# Patient Record
Sex: Male | Born: 1965 | Race: White | Hispanic: No | Smoking: Never smoker
Health system: Southern US, Community
[De-identification: ages and names within clinical notes are randomized; demographics above are authoritative.]

## PROBLEM LIST (undated history)

## (undated) DIAGNOSIS — I639 Cerebral infarction, unspecified: Secondary | ICD-10-CM

## (undated) DIAGNOSIS — E039 Hypothyroidism, unspecified: Secondary | ICD-10-CM

## (undated) DIAGNOSIS — I10 Essential (primary) hypertension: Secondary | ICD-10-CM

## (undated) DIAGNOSIS — G4730 Sleep apnea, unspecified: Secondary | ICD-10-CM

## (undated) DIAGNOSIS — G35 Multiple sclerosis: Secondary | ICD-10-CM

## (undated) DIAGNOSIS — I219 Acute myocardial infarction, unspecified: Secondary | ICD-10-CM

## (undated) DIAGNOSIS — R569 Unspecified convulsions: Secondary | ICD-10-CM

## (undated) DIAGNOSIS — R55 Syncope and collapse: Secondary | ICD-10-CM

## (undated) DIAGNOSIS — H469 Unspecified optic neuritis: Secondary | ICD-10-CM

---

## 1988-05-25 DIAGNOSIS — G35 Multiple sclerosis: Secondary | ICD-10-CM

## 1988-05-25 HISTORY — DX: Multiple sclerosis: G35

## 2005-05-25 HISTORY — PX: JOINT REPLACEMENT: SHX530

## 2013-05-25 HISTORY — PX: CORONARY ARTERY BYPASS GRAFT: SHX141

## 2013-05-25 HISTORY — PX: OTHER SURGICAL HISTORY: SHX169

## 2016-05-25 DIAGNOSIS — R55 Syncope and collapse: Secondary | ICD-10-CM

## 2016-05-25 HISTORY — DX: Syncope and collapse: R55

## 2016-09-21 ENCOUNTER — Emergency Department: Payer: Medicare Other

## 2016-09-21 ENCOUNTER — Inpatient Hospital Stay
Admission: EM | Admit: 2016-09-21 | Discharge: 2016-09-23 | DRG: 494 | Disposition: A | Discharge status: Home / Self Care | Payer: Medicare Other | Attending: Surgery | Admitting: Surgery

## 2016-09-21 DIAGNOSIS — S9304XA Dislocation of right ankle joint, initial encounter: Secondary | ICD-10-CM

## 2016-09-21 DIAGNOSIS — I2510 Atherosclerotic heart disease of native coronary artery without angina pectoris: Secondary | ICD-10-CM | POA: Diagnosis present

## 2016-09-21 DIAGNOSIS — M25571 Pain in right ankle and joints of right foot: Secondary | ICD-10-CM | POA: Diagnosis not present

## 2016-09-21 DIAGNOSIS — Z880 Allergy status to penicillin: Secondary | ICD-10-CM

## 2016-09-21 DIAGNOSIS — J449 Chronic obstructive pulmonary disease, unspecified: Secondary | ICD-10-CM | POA: Diagnosis present

## 2016-09-21 DIAGNOSIS — T148XXA Other injury of unspecified body region, initial encounter: Secondary | ICD-10-CM

## 2016-09-21 DIAGNOSIS — I10 Essential (primary) hypertension: Secondary | ICD-10-CM | POA: Diagnosis present

## 2016-09-21 DIAGNOSIS — S99911A Unspecified injury of right ankle, initial encounter: Secondary | ICD-10-CM | POA: Diagnosis not present

## 2016-09-21 DIAGNOSIS — Z951 Presence of aortocoronary bypass graft: Secondary | ICD-10-CM

## 2016-09-21 DIAGNOSIS — R55 Syncope and collapse: Secondary | ICD-10-CM | POA: Diagnosis present

## 2016-09-21 DIAGNOSIS — S82851A Displaced trimalleolar fracture of right lower leg, initial encounter for closed fracture: Principal | ICD-10-CM | POA: Diagnosis present

## 2016-09-21 DIAGNOSIS — G4730 Sleep apnea, unspecified: Secondary | ICD-10-CM | POA: Diagnosis present

## 2016-09-21 DIAGNOSIS — S9301XA Subluxation of right ankle joint, initial encounter: Secondary | ICD-10-CM | POA: Diagnosis not present

## 2016-09-21 DIAGNOSIS — W19XXXA Unspecified fall, initial encounter: Secondary | ICD-10-CM | POA: Diagnosis present

## 2016-09-21 DIAGNOSIS — S82891A Other fracture of right lower leg, initial encounter for closed fracture: Secondary | ICD-10-CM | POA: Diagnosis present

## 2016-09-21 DIAGNOSIS — G35 Multiple sclerosis: Secondary | ICD-10-CM | POA: Diagnosis present

## 2016-09-21 DIAGNOSIS — E039 Hypothyroidism, unspecified: Secondary | ICD-10-CM | POA: Diagnosis present

## 2016-09-21 DIAGNOSIS — I252 Old myocardial infarction: Secondary | ICD-10-CM

## 2016-09-21 DIAGNOSIS — S8991XA Unspecified injury of right lower leg, initial encounter: Secondary | ICD-10-CM | POA: Diagnosis not present

## 2016-09-21 HISTORY — DX: Multiple sclerosis: G35

## 2016-09-21 HISTORY — DX: Sleep apnea, unspecified: G47.30

## 2016-09-21 HISTORY — DX: Syncope and collapse: R55

## 2016-09-21 HISTORY — DX: Essential (primary) hypertension: I10

## 2016-09-21 HISTORY — DX: Hypothyroidism, unspecified: E03.9

## 2016-09-21 HISTORY — DX: Acute myocardial infarction, unspecified: I21.9

## 2016-09-21 HISTORY — DX: Unspecified optic neuritis: H46.9

## 2016-09-21 HISTORY — DX: Unspecified convulsions: R56.9

## 2016-09-21 LAB — CBC WITH DIFFERENTIAL/PLATELET
BASOS ABS: 0 10*3/uL (ref 0–0.1)
Basophils Relative: 1 %
EOS ABS: 0.2 10*3/uL (ref 0–0.7)
EOS PCT: 2 %
HCT: 42 % (ref 40.0–52.0)
Hemoglobin: 14.1 g/dL (ref 13.0–18.0)
Lymphocytes Relative: 24 %
Lymphs Abs: 2.3 10*3/uL (ref 1.0–3.6)
MCH: 30.7 pg (ref 26.0–34.0)
MCHC: 33.6 g/dL (ref 32.0–36.0)
MCV: 91.2 fL (ref 80.0–100.0)
Monocytes Absolute: 0.6 10*3/uL (ref 0.2–1.0)
Monocytes Relative: 6 %
NEUTROS PCT: 67 %
Neutro Abs: 6.4 10*3/uL (ref 1.4–6.5)
PLATELETS: 363 10*3/uL (ref 150–440)
RBC: 4.6 MIL/uL (ref 4.40–5.90)
RDW: 13.7 % (ref 11.5–14.5)
WBC: 9.5 10*3/uL (ref 3.8–10.6)

## 2016-09-21 LAB — BASIC METABOLIC PANEL
Anion gap: 11 (ref 5–15)
BUN: 19 mg/dL (ref 6–20)
CALCIUM: 9.3 mg/dL (ref 8.9–10.3)
CHLORIDE: 106 mmol/L (ref 101–111)
CO2: 22 mmol/L (ref 22–32)
CREATININE: 1.48 mg/dL — AB (ref 0.61–1.24)
GFR calc Af Amer: >60 mL/min (ref >60)
GFR calc non Af Amer: 53 mL/min — ABNORMAL LOW (ref >60)
Glucose, Bld: 95 mg/dL (ref 65–99)
Potassium: 4 mmol/L (ref 3.5–5.1)
SODIUM: 139 mmol/L (ref 135–145)

## 2016-09-21 LAB — HEPATIC FUNCTION PANEL
ALT: 33 U/L (ref 17–63)
AST: 31 U/L (ref 15–41)
Albumin: 4.4 g/dL (ref 3.5–5.0)
Alkaline Phosphatase: 74 U/L (ref 38–126)
BILIRUBIN TOTAL: 0.5 mg/dL (ref 0.3–1.2)
Bilirubin, Direct: <0.1 mg/dL — ABNORMAL LOW (ref 0.1–0.5)
TOTAL PROTEIN: 7.7 g/dL (ref 6.5–8.1)

## 2016-09-21 LAB — TYPE AND SCREEN
ABO/RH(D): A POS
ANTIBODY SCREEN: NEGATIVE

## 2016-09-21 LAB — PROTIME-INR
INR: 0.93
PROTHROMBIN TIME: 12.5 s (ref 11.4–15.2)

## 2016-09-21 MED ORDER — FENTANYL CITRATE (PF) 100 MCG/2ML IJ SOLN
100.0000 ug | Freq: Once | INTRAMUSCULAR | Status: AC
  Administered 2016-09-21: 100 ug via INTRAVENOUS

## 2016-09-21 MED ORDER — SODIUM CHLORIDE 0.9 % IV BOLUS (SEPSIS)
1000.0000 mL | Freq: Once | INTRAVENOUS | Status: AC
  Administered 2016-09-22: 1000 mL via INTRAVENOUS

## 2016-09-21 MED ORDER — FENTANYL CITRATE (PF) 100 MCG/2ML IJ SOLN
INTRAMUSCULAR | Status: AC
  Filled 2016-09-21: qty 2

## 2016-09-21 NOTE — ED Provider Notes (Addendum)
[facility] Emergency Department Provider Note  ____________________________________________   First MD Initiated Contact with Patient 09/21/16 2016     (approximate)  I have reviewed the triage vital signs and the nursing notes.   HISTORY  Chief Complaint Fall and Extremity Pain    HPI Cody Burns is a 51 y.o. male who comes to the emergency department with severe right ankle pain he sustained about half an hour prior to arrival when he fell down while working in the kitchen. He has been unable to ambulate since.He denies chest pain or shortness of breath. He denies palpitations. His only past medical history is hypertension and multiple sclerosis. He has never syncopized before. He denies headache. He denies numbness or weakness. His pain is severe right ankle nonradiating. Worse with movement and improved with immobilization.   Past Medical History:  Diagnosis Date  . Hypertension   . Myocardial infarct (HCC)     There are no active problems to display for this patient.   Past Surgical History:  Procedure Laterality Date  . quadruple cardiac bypass      Prior to Admission medications   Not on File    Allergies Penicillins  History reviewed. No pertinent family history.  Social History Social History  Substance Use Topics  . Smoking status: Never Smoker  . Smokeless tobacco: Not on file  . Alcohol use Yes    Review of Systems Constitutional: No fever/chills Eyes: No visual changes. ENT: No sore throat. Cardiovascular: Denies chest pain. Respiratory: Denies shortness of breath. Gastrointestinal: No abdominal pain.  No nausea, no vomiting.  No diarrhea.  No constipation. Genitourinary: Negative for dysuria. Musculoskeletal: Negative for back pain. Skin: Negative for rash. Neurological: Negative for headaches, focal weakness or numbness.  10-point ROS otherwise  negative.  ____________________________________________   PHYSICAL EXAM:  VITAL SIGNS: ED Triage Vitals  Enc Vitals Group     BP      Pulse      Resp      Temp      Temp src      SpO2      Weight      Height      Head Circumference      Peak Flow      Pain Score      Pain Loc      Pain Edu?      Excl. in GC?     Constitutional: Alert and oriented x 4 well appearing nontoxic no diaphoresis speaks in full, clear sentences Eyes: PERRL EOMI. Head: Atraumatic. Nose: No congestion/rhinnorhea. Mouth/Throat: No trismus Neck: No stridor.   Cardiovascular: Normal rate, regular rhythm. Grossly normal heart sounds.  Good peripheral circulation. Respiratory: Normal respiratory effort.  No retractions. Lungs CTAB and moving good air Gastrointestinal: Soft nontender Musculoskeletal: Right ankle obviously dislocated laterally with tenting of the skin medially but still closed 2+ dorsalis pulse compartments soft + motor ehl, edl, fhl, fdl ta, g SILT sural, saphenous, tibial, dp, sp Neurologic:  Normal speech and language. No gross focal neurologic deficits are appreciated. Skin:  Skin is warm, dry and intact. No rash noted. Psychiatric: Mood and affect are normal. Speech and behavior are normal.    ____________________________________________  ____________________________________________   LABS (all labs ordered are listed, but only abnormal results are displayed)  Labs Reviewed  BASIC METABOLIC PANEL - Abnormal; Notable for the following:       Result Value   Creatinine, Ser 1.48 (*)    GFR  calc non Af Amer 53 (*)    All other components within normal limits  HEPATIC FUNCTION PANEL - Abnormal; Notable for the following:    Bilirubin, Direct <0.1 (*)    All other components within normal limits  CBC WITH DIFFERENTIAL/PLATELET  PROTIME-INR  TYPE AND SCREEN    Slight elevation of creatinine that without knowing previous is difficult  to __________________________________________  EKG  ED ECG REPORT I, Merrily Brittle, the attending physician, personally viewed and interpreted this ECG.  Date: 09/21/2016 Rate: 72 Rhythm: normal sinus rhythm QRS Axis: normal Intervals: normal ST/T Wave abnormalities: normal Conduction Disturbances: none Narrative Interpretation: unremarkable  ____________________________________________  RADIOLOGY  X-ray of the ankle shows trimalleolar fracture dislocation Repeat x-ray shows successful reduction of the ankle ____________________________________________   PROCEDURES  Procedure(s) performed: yes  Reduction of dislocation Date/Time: 11:51 PM Performed by: Merrily Brittle Authorized by: Merrily Brittle Consent: Verbal consent obtained. Risks and benefits: risks, benefits and alternatives were discussed Consent given by: patient Required items: required blood products, implants, devices, and special equipment available Time out: Immediately prior to procedure a "time out" was called to verify the correct patient, procedure, equipment, support staff and site/side marked as required.  Vitals: Vital signs were monitored during sedation. Patient tolerance: Patient tolerated the procedure well with no immediate complications. Joint: Right ankle Reduction technique: Exacerbated the patient's injury and then providing countertraction and then reducing    Procedures  Critical Care performed: no  Observation: no ____________________________________________   INITIAL IMPRESSION / ASSESSMENT AND PLAN / ED COURSE  Pertinent labs & imaging results that were available during my care of the patient were reviewed by me and considered in my medical decision making (see chart for details).  On arrival the patient is neurovascularly intact with a clear dislocation of his right ankle. X-ray confirms fracture as well as dislocation. Although he had a strong pulse used tenting his  skin medially so I reduce the fracture with sentinel analgesia. While splinting in a posterior short leg and stirrup lost the reduction several times, however on final splinting and reduction was intact. I discussed the case with on-call orthopedic surgeon Dr. Joice Lofts who recommended inpatient admission and surgical fixation in the morning. The patient belongs [employer] so we'll touch base with them as he is currently stable for transfer.      ----------------------------------------- 12:07 AM on 09/22/2016 -----------------------------------------  I spoke with Dr. Katha Hamming at the [facility] who spoke with his Orthopedic surgeons who said they don't have OR space for the next week and that the patient should be admitted to our facility. I then discussed the case with Dr. Joice Lofts who has graciously agreed to admit the patient to his service. ____________________________________________   FINAL CLINICAL IMPRESSION(S) / ED DIAGNOSES  Final diagnoses:  Closed fracture of right ankle, initial encounter  Ankle dislocation, right, initial encounter      NEW MEDICATIONS STARTED DURING THIS VISIT:  New Prescriptions   No medications on file     Note:  This document was prepared using Dragon voice recognition software and may include unintentional dictation errors.     Merrily Brittle, MD 09/21/16 2357    Merrily Brittle, MD 09/22/16 1914    Merrily Brittle, MD 09/22/16 7829

## 2016-09-21 NOTE — ED Notes (Signed)
Ankle reduced by Dr. Lamont Snowball. Deberah Castle, Kennedy Bucker EDT, Tech student at bedside to wrap and immobilize ankle. Xray called back to room to do confirmation xray.

## 2016-09-21 NOTE — ED Triage Notes (Signed)
Pt arrives from home via [facility] for a slip and fall while doing dishes. PT arrives with R ankle dislocated, obvious deformity, swelling, bruising. Cool to touch. Strong pedal pulses. Pale/dusky color. Unable to wiggle toes. Alert and oriented. States he does not remember fall or if he hit his head.

## 2016-09-21 NOTE — ED Notes (Signed)
Pt family at bedside. Pt resting comfortably. Denies any needs at this time.

## 2016-09-22 ENCOUNTER — Inpatient Hospital Stay: Payer: Medicare Other

## 2016-09-22 ENCOUNTER — Inpatient Hospital Stay: Payer: Medicare Other | Admitting: Anesthesiology

## 2016-09-22 ENCOUNTER — Encounter
Admission: EM | Disposition: A | Discharge status: Home / Self Care | Payer: Self-pay | Source: Home / Self Care | Attending: Surgery

## 2016-09-22 DIAGNOSIS — W19XXXA Unspecified fall, initial encounter: Secondary | ICD-10-CM | POA: Diagnosis present

## 2016-09-22 DIAGNOSIS — I252 Old myocardial infarction: Secondary | ICD-10-CM | POA: Diagnosis not present

## 2016-09-22 DIAGNOSIS — S82891A Other fracture of right lower leg, initial encounter for closed fracture: Secondary | ICD-10-CM | POA: Diagnosis not present

## 2016-09-22 DIAGNOSIS — G35 Multiple sclerosis: Secondary | ICD-10-CM | POA: Diagnosis present

## 2016-09-22 DIAGNOSIS — S9301XA Subluxation of right ankle joint, initial encounter: Secondary | ICD-10-CM | POA: Diagnosis not present

## 2016-09-22 DIAGNOSIS — E039 Hypothyroidism, unspecified: Secondary | ICD-10-CM | POA: Diagnosis present

## 2016-09-22 DIAGNOSIS — Z951 Presence of aortocoronary bypass graft: Secondary | ICD-10-CM | POA: Diagnosis not present

## 2016-09-22 DIAGNOSIS — Z880 Allergy status to penicillin: Secondary | ICD-10-CM | POA: Diagnosis not present

## 2016-09-22 DIAGNOSIS — S82301A Unspecified fracture of lower end of right tibia, initial encounter for closed fracture: Secondary | ICD-10-CM | POA: Diagnosis not present

## 2016-09-22 DIAGNOSIS — I2510 Atherosclerotic heart disease of native coronary artery without angina pectoris: Secondary | ICD-10-CM | POA: Diagnosis present

## 2016-09-22 DIAGNOSIS — S82851A Displaced trimalleolar fracture of right lower leg, initial encounter for closed fracture: Secondary | ICD-10-CM | POA: Diagnosis not present

## 2016-09-22 DIAGNOSIS — S99911A Unspecified injury of right ankle, initial encounter: Secondary | ICD-10-CM | POA: Diagnosis not present

## 2016-09-22 DIAGNOSIS — S82831A Other fracture of upper and lower end of right fibula, initial encounter for closed fracture: Secondary | ICD-10-CM | POA: Diagnosis not present

## 2016-09-22 DIAGNOSIS — M25571 Pain in right ankle and joints of right foot: Secondary | ICD-10-CM | POA: Diagnosis not present

## 2016-09-22 DIAGNOSIS — S8991XA Unspecified injury of right lower leg, initial encounter: Secondary | ICD-10-CM | POA: Diagnosis not present

## 2016-09-22 DIAGNOSIS — I10 Essential (primary) hypertension: Secondary | ICD-10-CM | POA: Diagnosis present

## 2016-09-22 DIAGNOSIS — R55 Syncope and collapse: Secondary | ICD-10-CM | POA: Diagnosis present

## 2016-09-22 DIAGNOSIS — G4730 Sleep apnea, unspecified: Secondary | ICD-10-CM | POA: Diagnosis present

## 2016-09-22 DIAGNOSIS — J449 Chronic obstructive pulmonary disease, unspecified: Secondary | ICD-10-CM | POA: Diagnosis present

## 2016-09-22 HISTORY — PX: ORIF ANKLE FRACTURE: SHX5408

## 2016-09-22 LAB — SURGICAL PCR SCREEN
MRSA, PCR: NEGATIVE
STAPHYLOCOCCUS AUREUS: NEGATIVE

## 2016-09-22 SURGERY — OPEN REDUCTION INTERNAL FIXATION (ORIF) ANKLE FRACTURE
Anesthesia: General | Site: Ankle | Laterality: Right | Wound class: Clean

## 2016-09-22 MED ORDER — MEPERIDINE HCL 50 MG/ML IJ SOLN: 6.2500 mg | INTRAMUSCULAR | Status: DC | PRN

## 2016-09-22 MED ORDER — CLINDAMYCIN PHOSPHATE 600 MG/50ML IV SOLN
600.0000 mg | Freq: Four times a day (QID) | INTRAVENOUS | Status: DC
  Administered 2016-09-22 – 2016-09-23 (×2): 600 mg via INTRAVENOUS
  Filled 2016-09-22 (×3): qty 50

## 2016-09-22 MED ORDER — GABAPENTIN 300 MG PO CAPS
900.0000 mg | ORAL_CAPSULE | Freq: Four times a day (QID) | ORAL | Status: DC
  Administered 2016-09-22: 900 mg via ORAL
  Filled 2016-09-22 (×2): qty 3

## 2016-09-22 MED ORDER — ONDANSETRON HCL 4 MG/2ML IJ SOLN: 4.0000 mg | Freq: Four times a day (QID) | INTRAMUSCULAR | Status: DC | PRN

## 2016-09-22 MED ORDER — FLEET ENEMA 7-19 GM/118ML RE ENEM: 1.0000 | ENEMA | Freq: Once | RECTAL | Status: DC | PRN

## 2016-09-22 MED ORDER — PROMETHAZINE HCL 25 MG/ML IJ SOLN: 6.2500 mg | INTRAMUSCULAR | Status: DC | PRN

## 2016-09-22 MED ORDER — PANTOPRAZOLE SODIUM 40 MG IV SOLR: 40.0000 mg | Freq: Every day | INTRAVENOUS | Status: DC

## 2016-09-22 MED ORDER — ACETAMINOPHEN 325 MG PO TABS: 650.0000 mg | ORAL_TABLET | Freq: Four times a day (QID) | ORAL | Status: DC | PRN

## 2016-09-22 MED ORDER — EPHEDRINE SULFATE 50 MG/ML IJ SOLN
INTRAMUSCULAR | Status: DC | PRN
  Administered 2016-09-22 (×3): 10 mg via INTRAVENOUS

## 2016-09-22 MED ORDER — OXYCODONE HCL 5 MG PO TABS
5.0000 mg | ORAL_TABLET | ORAL | Status: DC | PRN
  Administered 2016-09-22 (×2): 5 mg via ORAL
  Filled 2016-09-22 (×2): qty 1

## 2016-09-22 MED ORDER — NEOMYCIN-POLYMYXIN B GU 40-200000 IR SOLN
Status: AC
  Filled 2016-09-22: qty 4

## 2016-09-22 MED ORDER — OXYCODONE HCL 5 MG PO TABS: 5.0000 mg | ORAL_TABLET | ORAL | Status: DC | PRN

## 2016-09-22 MED ORDER — LABETALOL HCL 5 MG/ML IV SOLN
INTRAVENOUS | Status: DC | PRN
  Administered 2016-09-22: 5 mg via INTRAVENOUS

## 2016-09-22 MED ORDER — DIMETHYL FUMARATE 240 MG PO CPDR: 240.0000 mg | DELAYED_RELEASE_CAPSULE | Freq: Two times a day (BID) | ORAL | Status: DC

## 2016-09-22 MED ORDER — PHENYLEPHRINE 8 MG IN D5W 100 ML (0.08MG/ML) PREMIX OPTIME
INJECTION | INTRAVENOUS | Status: DC | PRN
Start: 2016-09-22 — End: 2016-09-22
  Administered 2016-09-22: 20 ug/min via INTRAVENOUS

## 2016-09-22 MED ORDER — VITAMIN B-12 1000 MCG PO TABS
1000.0000 ug | ORAL_TABLET | Freq: Every day | ORAL | Status: DC
  Filled 2016-09-22: qty 1

## 2016-09-22 MED ORDER — LEVOTHYROXINE SODIUM 75 MCG PO TABS
75.0000 ug | ORAL_TABLET | Freq: Every day | ORAL | Status: DC
  Filled 2016-09-22: qty 1

## 2016-09-22 MED ORDER — FENTANYL CITRATE (PF) 100 MCG/2ML IJ SOLN: 25.0000 ug | INTRAMUSCULAR | Status: DC | PRN

## 2016-09-22 MED ORDER — PROPOFOL 10 MG/ML IV BOLUS
INTRAVENOUS | Status: AC
  Filled 2016-09-22: qty 20

## 2016-09-22 MED ORDER — MONTELUKAST SODIUM 10 MG PO TABS
10.0000 mg | ORAL_TABLET | Freq: Every day | ORAL | Status: DC
  Administered 2016-09-22: 10 mg via ORAL
  Filled 2016-09-22: qty 1

## 2016-09-22 MED ORDER — ACETAMINOPHEN 500 MG PO TABS
1000.0000 mg | ORAL_TABLET | Freq: Four times a day (QID) | ORAL | Status: DC
  Administered 2016-09-22 – 2016-09-23 (×2): 1000 mg via ORAL
  Filled 2016-09-22 (×2): qty 2

## 2016-09-22 MED ORDER — BISACODYL 10 MG RE SUPP: 10.0000 mg | Freq: Every day | RECTAL | Status: DC | PRN

## 2016-09-22 MED ORDER — BUPIVACAINE HCL (PF) 0.5 % IJ SOLN
INTRAMUSCULAR | Status: AC
  Filled 2016-09-22: qty 30

## 2016-09-22 MED ORDER — MIDAZOLAM HCL 2 MG/2ML IJ SOLN
INTRAMUSCULAR | Status: AC
  Filled 2016-09-22: qty 2

## 2016-09-22 MED ORDER — METOPROLOL SUCCINATE ER 50 MG PO TB24
100.0000 mg | ORAL_TABLET | Freq: Every day | ORAL | Status: DC
  Filled 2016-09-22: qty 2

## 2016-09-22 MED ORDER — SODIUM CHLORIDE 0.9 % IV SOLN
INTRAVENOUS | Status: DC
  Administered 2016-09-22: 75 mL/h via INTRAVENOUS

## 2016-09-22 MED ORDER — ACETAMINOPHEN 10 MG/ML IV SOLN
INTRAVENOUS | Status: DC | PRN
  Administered 2016-09-22: 1000 mg via INTRAVENOUS

## 2016-09-22 MED ORDER — OXYCODONE HCL 5 MG/5ML PO SOLN: 5.0000 mg | Freq: Once | ORAL | Status: DC | PRN

## 2016-09-22 MED ORDER — PHENYLEPHRINE HCL 10 MG/ML IJ SOLN
INTRAMUSCULAR | Status: DC | PRN
  Administered 2016-09-22 (×3): 100 ug via INTRAVENOUS

## 2016-09-22 MED ORDER — DOCUSATE SODIUM 100 MG PO CAPS: 100.0000 mg | ORAL_CAPSULE | Freq: Two times a day (BID) | ORAL | Status: DC

## 2016-09-22 MED ORDER — HYDROMORPHONE HCL 1 MG/ML IJ SOLN: 1.0000 mg | INTRAMUSCULAR | Status: DC | PRN

## 2016-09-22 MED ORDER — KETOROLAC TROMETHAMINE 15 MG/ML IJ SOLN
15.0000 mg | Freq: Four times a day (QID) | INTRAMUSCULAR | Status: DC
  Administered 2016-09-22 – 2016-09-23 (×2): 15 mg via INTRAVENOUS
  Filled 2016-09-22 (×2): qty 1

## 2016-09-22 MED ORDER — DIPHENHYDRAMINE HCL 12.5 MG/5ML PO ELIX: 12.5000 mg | ORAL_SOLUTION | ORAL | Status: DC | PRN

## 2016-09-22 MED ORDER — ESCITALOPRAM OXALATE 10 MG PO TABS
20.0000 mg | ORAL_TABLET | Freq: Every day | ORAL | Status: DC
  Filled 2016-09-22: qty 2

## 2016-09-22 MED ORDER — METOPROLOL SUCCINATE ER 50 MG PO TB24
100.0000 mg | ORAL_TABLET | Freq: Every day | ORAL | Status: DC
  Administered 2016-09-22: 100 mg via ORAL

## 2016-09-22 MED ORDER — MAGNESIUM HYDROXIDE 400 MG/5ML PO SUSP: 30.0000 mL | Freq: Every day | ORAL | Status: DC | PRN

## 2016-09-22 MED ORDER — KETOROLAC TROMETHAMINE 30 MG/ML IJ SOLN
INTRAMUSCULAR | Status: DC | PRN
  Administered 2016-09-22: 30 mg via INTRAVENOUS

## 2016-09-22 MED ORDER — FENTANYL CITRATE (PF) 100 MCG/2ML IJ SOLN
INTRAMUSCULAR | Status: DC | PRN
  Administered 2016-09-22: 50 ug via INTRAVENOUS
  Administered 2016-09-22: 100 ug via INTRAVENOUS
  Administered 2016-09-22 (×4): 50 ug via INTRAVENOUS

## 2016-09-22 MED ORDER — POTASSIUM CHLORIDE 2 MEQ/ML IV SOLN
INTRAVENOUS | Status: DC
  Administered 2016-09-22 – 2016-09-23 (×2): via INTRAVENOUS
  Filled 2016-09-22 (×5): qty 1000

## 2016-09-22 MED ORDER — KCL IN DEXTROSE-NACL 20-5-0.9 MEQ/L-%-% IV SOLN
INTRAVENOUS | Status: DC
  Administered 2016-09-22: 19:00:00 via INTRAVENOUS
  Filled 2016-09-22 (×3): qty 1000

## 2016-09-22 MED ORDER — CALCIUM CARBONATE-VITAMIN D 500-200 MG-UNIT PO TABS
1.0000 | ORAL_TABLET | Freq: Every day | ORAL | Status: DC
  Filled 2016-09-22: qty 1

## 2016-09-22 MED ORDER — OXYCODONE HCL 5 MG PO TABS: 5.0000 mg | ORAL_TABLET | Freq: Once | ORAL | Status: DC | PRN

## 2016-09-22 MED ORDER — METOCLOPRAMIDE HCL 10 MG PO TABS: 5.0000 mg | ORAL_TABLET | Freq: Three times a day (TID) | ORAL | Status: DC | PRN

## 2016-09-22 MED ORDER — ATORVASTATIN CALCIUM 20 MG PO TABS
80.0000 mg | ORAL_TABLET | Freq: Every day | ORAL | Status: DC
  Administered 2016-09-22: 80 mg via ORAL
  Filled 2016-09-22: qty 4

## 2016-09-22 MED ORDER — AMLODIPINE BESYLATE 5 MG PO TABS
5.0000 mg | ORAL_TABLET | Freq: Every day | ORAL | Status: DC
  Filled 2016-09-22: qty 1

## 2016-09-22 MED ORDER — KETOROLAC TROMETHAMINE 15 MG/ML IJ SOLN: 30.0000 mg | Freq: Once | INTRAMUSCULAR | Status: DC

## 2016-09-22 MED ORDER — QUETIAPINE FUMARATE 100 MG PO TABS
100.0000 mg | ORAL_TABLET | Freq: Every day | ORAL | Status: DC
  Administered 2016-09-22: 100 mg via ORAL
  Filled 2016-09-22: qty 1

## 2016-09-22 MED ORDER — HYDROMORPHONE HCL 1 MG/ML IJ SOLN
1.0000 mg | INTRAMUSCULAR | Status: DC | PRN
  Administered 2016-09-22 (×3): 1 mg via INTRAVENOUS
  Filled 2016-09-22 (×3): qty 1

## 2016-09-22 MED ORDER — ONDANSETRON HCL 4 MG/2ML IJ SOLN
INTRAMUSCULAR | Status: AC
  Filled 2016-09-22: qty 2

## 2016-09-22 MED ORDER — CLINDAMYCIN PHOSPHATE 900 MG/50ML IV SOLN
900.0000 mg | Freq: Once | INTRAVENOUS | Status: AC
  Administered 2016-09-22: 900 mg via INTRAVENOUS
  Filled 2016-09-22: qty 50

## 2016-09-22 MED ORDER — ACETAMINOPHEN 650 MG RE SUPP: 650.0000 mg | Freq: Four times a day (QID) | RECTAL | Status: DC | PRN

## 2016-09-22 MED ORDER — FENTANYL CITRATE (PF) 100 MCG/2ML IJ SOLN
INTRAMUSCULAR | Status: AC
Start: 2016-09-22 — End: 2016-09-22
  Filled 2016-09-22: qty 2

## 2016-09-22 MED ORDER — METOCLOPRAMIDE HCL 5 MG/ML IJ SOLN: 5.0000 mg | Freq: Three times a day (TID) | INTRAMUSCULAR | Status: DC | PRN

## 2016-09-22 MED ORDER — NEOMYCIN-POLYMYXIN B GU 40-200000 IR SOLN
Status: DC | PRN
  Administered 2016-09-22: 4 mL

## 2016-09-22 MED ORDER — FENTANYL CITRATE (PF) 250 MCG/5ML IJ SOLN
INTRAMUSCULAR | Status: AC
  Filled 2016-09-22: qty 5

## 2016-09-22 MED ORDER — METOPROLOL SUCCINATE ER 100 MG PO TB24
200.0000 mg | ORAL_TABLET | Freq: Every day | ORAL | Status: DC
  Filled 2016-09-22 (×2): qty 2

## 2016-09-22 MED ORDER — ACETAMINOPHEN 10 MG/ML IV SOLN
INTRAVENOUS | Status: AC
  Filled 2016-09-22: qty 100

## 2016-09-22 MED ORDER — EPHEDRINE SULFATE 50 MG/ML IJ SOLN
INTRAMUSCULAR | Status: AC
  Filled 2016-09-22: qty 1

## 2016-09-22 MED ORDER — LIDOCAINE HCL (CARDIAC) 20 MG/ML IV SOLN
INTRAVENOUS | Status: DC | PRN
  Administered 2016-09-22: 60 mg via INTRAVENOUS

## 2016-09-22 MED ORDER — BUPROPION HCL ER (XL) 300 MG PO TB24
300.0000 mg | ORAL_TABLET | Freq: Every day | ORAL | Status: DC
  Filled 2016-09-22 (×2): qty 1

## 2016-09-22 MED ORDER — KCL IN DEXTROSE-NACL 20-5-0.9 MEQ/L-%-% IV SOLN
INTRAVENOUS | Status: DC
  Administered 2016-09-22: 04:00:00 via INTRAVENOUS
  Filled 2016-09-22 (×5): qty 1000

## 2016-09-22 MED ORDER — BACLOFEN 10 MG PO TABS
10.0000 mg | ORAL_TABLET | Freq: Three times a day (TID) | ORAL | Status: DC
  Administered 2016-09-22: 10 mg via ORAL
  Filled 2016-09-22 (×2): qty 1

## 2016-09-22 MED ORDER — BUPIVACAINE HCL 0.5 % IJ SOLN
INTRAMUSCULAR | Status: DC | PRN
  Administered 2016-09-22: 30 mL

## 2016-09-22 MED ORDER — SENNOSIDES-DOCUSATE SODIUM 8.6-50 MG PO TABS
2.0000 | ORAL_TABLET | Freq: Two times a day (BID) | ORAL | Status: DC
  Administered 2016-09-22: 2 via ORAL
  Filled 2016-09-22 (×2): qty 2

## 2016-09-22 MED ORDER — PROPOFOL 10 MG/ML IV BOLUS
INTRAVENOUS | Status: DC | PRN
  Administered 2016-09-22: 150 mg via INTRAVENOUS

## 2016-09-22 MED ORDER — MIDAZOLAM HCL 2 MG/2ML IJ SOLN
INTRAMUSCULAR | Status: DC | PRN
  Administered 2016-09-22: 2 mg via INTRAVENOUS

## 2016-09-22 MED ORDER — ONDANSETRON HCL 4 MG/2ML IJ SOLN
INTRAMUSCULAR | Status: DC | PRN
  Administered 2016-09-22: 4 mg via INTRAVENOUS

## 2016-09-22 MED ORDER — LACTATED RINGERS IV SOLN
INTRAVENOUS | Status: DC | PRN
  Administered 2016-09-22 (×2): via INTRAVENOUS

## 2016-09-22 MED ORDER — DOCUSATE SODIUM 100 MG PO CAPS
100.0000 mg | ORAL_CAPSULE | Freq: Two times a day (BID) | ORAL | Status: DC
  Administered 2016-09-22: 100 mg via ORAL
  Filled 2016-09-22 (×2): qty 1

## 2016-09-22 MED ORDER — PANTOPRAZOLE SODIUM 40 MG PO TBEC
40.0000 mg | DELAYED_RELEASE_TABLET | Freq: Two times a day (BID) | ORAL | Status: DC
  Administered 2016-09-22: 40 mg via ORAL
  Filled 2016-09-22 (×2): qty 1

## 2016-09-22 MED ORDER — ONDANSETRON HCL 4 MG PO TABS: 4.0000 mg | ORAL_TABLET | Freq: Four times a day (QID) | ORAL | Status: DC | PRN

## 2016-09-22 MED ORDER — ASPIRIN EC 81 MG PO TBEC
81.0000 mg | DELAYED_RELEASE_TABLET | Freq: Every day | ORAL | Status: DC
  Filled 2016-09-22: qty 1

## 2016-09-22 MED ORDER — CLINDAMYCIN PHOSPHATE 900 MG/50ML IV SOLN
INTRAVENOUS | Status: DC | PRN
  Administered 2016-09-22: 900 mg via INTRAVENOUS

## 2016-09-22 MED ORDER — MORPHINE SULFATE (PF) 4 MG/ML IV SOLN
6.0000 mg | Freq: Once | INTRAVENOUS | Status: AC
  Administered 2016-09-22: 6 mg via INTRAVENOUS
  Filled 2016-09-22: qty 2

## 2016-09-22 MED ORDER — ENOXAPARIN SODIUM 40 MG/0.4ML ~~LOC~~ SOLN
40.0000 mg | SUBCUTANEOUS | Status: DC
  Filled 2016-09-22: qty 0.4

## 2016-09-22 MED ORDER — KETOROLAC TROMETHAMINE 30 MG/ML IJ SOLN
INTRAMUSCULAR | Status: AC
  Filled 2016-09-22: qty 1

## 2016-09-22 SURGICAL SUPPLY — 58 items
BANDAGE ACE 4X5 VEL STRL LF (GAUZE/BANDAGES/DRESSINGS) ×6 IMPLANT
BANDAGE ACE 6X5 VEL STRL LF (GAUZE/BANDAGES/DRESSINGS) ×3 IMPLANT
BIT DRILL 2.5X2.75 QC CALB (BIT) ×3 IMPLANT
BIT DRILL 2.9 CANN QC NONSTRL (BIT) ×3 IMPLANT
BIT DRILL 3.5X5.5 QC CALB (BIT) ×3 IMPLANT
BIT DRILL CALIBRATED 2.7 (BIT) ×2 IMPLANT
BIT DRILL CALIBRATED 2.7MM (BIT) ×1
BLADE SURG SZ10 CARB STEEL (BLADE) ×6 IMPLANT
BNDG COHESIVE 4X5 TAN STRL (GAUZE/BANDAGES/DRESSINGS) ×6 IMPLANT
BNDG ESMARK 6X12 TAN STRL LF (GAUZE/BANDAGES/DRESSINGS) ×3 IMPLANT
BNDG PLASTER FAST 4X5 WHT LF (CAST SUPPLIES) ×12 IMPLANT
CANISTER SUCT 1200ML W/VALVE (MISCELLANEOUS) ×3 IMPLANT
CHLORAPREP W/TINT 26ML (MISCELLANEOUS) ×6 IMPLANT
CUFF TOURN 24 STER (MISCELLANEOUS) ×3 IMPLANT
CUFF TOURN 30 STER DUAL PORT (MISCELLANEOUS) IMPLANT
DRAPE C-ARM XRAY 36X54 (DRAPES) ×3 IMPLANT
DRAPE C-ARMOR (DRAPES) ×3 IMPLANT
DRAPE INCISE IOBAN 66X45 STRL (DRAPES) ×3 IMPLANT
DRAPE U-SHAPE 47X51 STRL (DRAPES) ×3 IMPLANT
ELECT CAUTERY BLADE 6.4 (BLADE) ×3 IMPLANT
ELECT REM PT RETURN 9FT ADLT (ELECTROSURGICAL) ×3
ELECTRODE REM PT RTRN 9FT ADLT (ELECTROSURGICAL) ×1 IMPLANT
GAUZE PETRO XEROFOAM 1X8 (MISCELLANEOUS) ×3 IMPLANT
GAUZE SPONGE 4X4 12PLY STRL (GAUZE/BANDAGES/DRESSINGS) ×6 IMPLANT
GAUZE XEROFORM 4X4 STRL (GAUZE/BANDAGES/DRESSINGS) ×6 IMPLANT
GLOVE BIO SURGEON STRL SZ8 (GLOVE) ×6 IMPLANT
GLOVE INDICATOR 8.0 STRL GRN (GLOVE) ×3 IMPLANT
GOWN STRL REUS W/ TWL LRG LVL3 (GOWN DISPOSABLE) ×1 IMPLANT
GOWN STRL REUS W/ TWL XL LVL3 (GOWN DISPOSABLE) ×1 IMPLANT
GOWN STRL REUS W/TWL LRG LVL3 (GOWN DISPOSABLE) ×2
GOWN STRL REUS W/TWL XL LVL3 (GOWN DISPOSABLE) ×2
HEMOVAC 400ML (MISCELLANEOUS) ×3
K-WIRE ACE 1.6X6 (WIRE) ×6
KIT DRAIN HEMOVAC JP 7FR 400ML (MISCELLANEOUS) ×1 IMPLANT
KIT RM TURNOVER STRD PROC AR (KITS) ×3 IMPLANT
KWIRE ACE 1.6X6 (WIRE) ×2 IMPLANT
LABEL OR SOLS (LABEL) ×3 IMPLANT
NS IRRIG 1000ML POUR BTL (IV SOLUTION) ×3 IMPLANT
PACK EXTREMITY ARMC (MISCELLANEOUS) ×3 IMPLANT
PAD ABD DERMACEA PRESS 5X9 (GAUZE/BANDAGES/DRESSINGS) ×6 IMPLANT
PAD CAST CTTN 4X4 STRL (SOFTGOODS) ×4 IMPLANT
PAD PREP 24X41 OB/GYN DISP (PERSONAL CARE ITEMS) ×3 IMPLANT
PADDING CAST COTTON 4X4 STRL (SOFTGOODS) ×8
PLATE LOCK 7H 92 BILAT FIB (Plate) ×3 IMPLANT
SCREW ACE CAN 4.0 50M (Screw) ×6 IMPLANT
SCREW CORTICAL 3.5MM 22MM (Screw) ×6 IMPLANT
SCREW LOCK CORT STAR 3.5X10 (Screw) ×3 IMPLANT
SCREW LOCK CORT STAR 3.5X12 (Screw) ×3 IMPLANT
SCREW LOCK CORT STAR 3.5X14 (Screw) ×3 IMPLANT
SCREW NON LOCKING LP 3.5 14MM (Screw) ×6 IMPLANT
SCREW NON LOCKING LP 3.5 16MM (Screw) ×3 IMPLANT
SPONGE LAP 18X18 5 PK (GAUZE/BANDAGES/DRESSINGS) ×6 IMPLANT
STAPLER SKIN PROX 35W (STAPLE) ×3 IMPLANT
STOCKINETTE IMPERV 14X48 (MISCELLANEOUS) ×3 IMPLANT
SUT VIC AB 0 CT1 36 (SUTURE) ×3 IMPLANT
SUT VIC AB 2-0 SH 27 (SUTURE) ×8
SUT VIC AB 2-0 SH 27XBRD (SUTURE) ×4 IMPLANT
SYRINGE 10CC LL (SYRINGE) ×3 IMPLANT

## 2016-09-22 NOTE — Progress Notes (Signed)
Pt wanted to restart his daily medication in the morning time. Took pm medications without diffiuclty.

## 2016-09-22 NOTE — NC FL2 (Signed)
[place] MEDICAID FL2 LEVEL OF CARE SCREENING TOOL     IDENTIFICATION  Patient Name: Cody Burns Birthdate: April 23, 1966 Sex: male Admission Date (Current Location): 09/21/2016  [place] and [place] Number:  Chiropodist and Address:  [facility], [address] [place], [place] [address]      Provider Number: [id number]  Attending Physician Name and Address:  Christena Flake, MD  Relative Name and Phone Number:       Current Level of Care: Hospital Recommended Level of Care: Skilled Nursing Facility Prior Approval Number:    Date Approved/Denied:   PASRR Number:  [id number])  Discharge Plan: SNF    Current Diagnoses: Patient Active Problem List   Diagnosis Date Noted  . Closed right ankle fracture 09/22/2016    Orientation RESPIRATION BLADDER Height & Weight     Self, Time, Situation, Place  Normal Continent Weight: 238 lb (108 kg) Height:  6' (182.9 cm)  BEHAVIORAL SYMPTOMS/MOOD NEUROLOGICAL BOWEL NUTRITION STATUS   (none)  (none) Continent Diet (NPO for surgery )  AMBULATORY STATUS COMMUNICATION OF NEEDS Skin   Extensive Assist Verbally Surgical wounds                       Personal Care Assistance Level of Assistance  Bathing, Feeding, Dressing Bathing Assistance: Limited assistance Feeding assistance: Independent Dressing Assistance: Limited assistance     Functional Limitations Info  Sight, Hearing, Speech Sight Info: Impaired Hearing Info: Adequate Speech Info: Adequate    SPECIAL CARE FACTORS FREQUENCY  PT (By licensed PT), OT (By licensed OT)     PT Frequency:  (5) OT Frequency:  (5)            Contractures      Additional Factors Info  Code Status, Allergies Code Status Info:  (Full Code. ) Allergies Info:  (Penicillins)           Current Medications (09/22/2016):  This is the current hospital active medication list Current Facility-Administered Medications  Medication Dose Route  Frequency Provider Last Rate Last Dose  . 0.9 %  sodium chloride infusion   Intravenous Continuous Amy Penwarden, MD      . acetaminophen (TYLENOL) tablet 650 mg  650 mg Oral Q6H PRN Christena Flake, MD       Or  . acetaminophen (TYLENOL) suppository 650 mg  650 mg Rectal Q6H PRN Christena Flake, MD      . bisacodyl (DULCOLAX) suppository 10 mg  10 mg Rectal Daily PRN Christena Flake, MD      . clindamycin (CLEOCIN) IVPB 900 mg  900 mg Intravenous Once Christena Flake, MD      . dextrose 5 % and 0.9 % NaCl with KCl 20 mEq/L infusion   Intravenous Continuous Christena Flake, MD 100 mL/hr at 09/22/16 0334    . docusate sodium (COLACE) capsule 100 mg  100 mg Oral BID Christena Flake, MD      . HYDROmorphone (DILAUDID) injection 1-2 mg  1-2 mg Intravenous Q2H PRN Christena Flake, MD   1 mg at 09/22/16 0925  . magnesium hydroxide (MILK OF MAGNESIA) suspension 30 mL  30 mL Oral Daily PRN Christena Flake, MD      . metoprolol succinate (TOPROL-XL) 24 hr tablet 100 mg  100 mg Oral Daily Amy Penwarden, MD      . Mitzi Hansen Hold] ondansetron (ZOFRAN) injection 4 mg  4 mg Intravenous Q6H PRN Excell Seltzer  Poggi, MD      . oxyCODONE (Oxy IR/ROXICODONE) immediate release tablet 5-10 mg  5-10 mg Oral Q4H PRN Christena Flake, MD      . pantoprazole (PROTONIX) injection 40 mg  40 mg Intravenous QHS Christena Flake, MD      . sodium phosphate (FLEET) 7-19 GM/118ML enema 1 enema  1 enema Rectal Once PRN Christena Flake, MD         Discharge Medications: Please see discharge summary for a list of discharge medications.  Relevant Imaging Results:  Relevant Lab Results:   Additional Information  (SSN: [id number])  Cody Burns, Darleen Crocker, LCSW

## 2016-09-22 NOTE — Clinical Social Work Placement (Signed)
   CLINICAL SOCIAL WORK PLACEMENT  NOTE  Date:  09/22/2016  Patient Details  Name: Cody Burns MRN: [id number] Date of Birth: Aug 26, 1965  Clinical Social Work is seeking post-discharge placement for this patient at the Skilled  Nursing Facility level of care (*CSW will initial, date and re-position this form in  chart as items are completed):  Yes   Patient/family provided with [place] Clinical Social Work Department's list of facilities offering this level of care within the geographic area requested by the patient (or if unable, by the patient's family).  Yes   Patient/family informed of their freedom to choose among providers that offer the needed level of care, that participate in Medicare, Medicaid or managed care program needed by the patient, have an available bed and are willing to accept the patient.  Yes   Patient/family informed of [facility]'s ownership interest in [facility] and [facility], as well as of the fact that they are under no obligation to receive care at these facilities.  PASRR submitted to EDS on 09/22/16     PASRR number received on 09/22/16     Existing PASRR number confirmed on       FL2 transmitted to all facilities in geographic area requested by pt/family on 09/22/16     FL2 transmitted to all facilities within larger geographic area on       Patient informed that his/her managed care company has contracts with or will negotiate with certain facilities, including the following:            Patient/family informed of bed offers received.  Patient chooses bed at       Physician recommends and patient chooses bed at      Patient to be transferred to   on  .  Patient to be transferred to facility by       Patient family notified on   of transfer.  Name of family member notified:        PHYSICIAN       Additional Comment:    _______________________________________________ Keyon Winnick, Darleen Crocker, LCSW 09/22/2016, 2:38 PM

## 2016-09-22 NOTE — Anesthesia Post-op Follow-up Note (Cosign Needed)
Anesthesia QCDR form completed.        

## 2016-09-22 NOTE — Anesthesia Preprocedure Evaluation (Signed)
Anesthesia Evaluation  Patient identified by MRN, date of birth, ID band Patient awake    Reviewed: Allergy & Precautions, NPO status , Patient's Chart, lab work & pertinent test results  History of Anesthesia Complications Negative for: history of anesthetic complications  Airway Mallampati: II  TM Distance: >3 FB Neck ROM: Full    Dental no notable dental hx.    Pulmonary neg shortness of breath, sleep apnea , neg COPD,    breath sounds clear to auscultation- rhonchi (-) wheezing      Cardiovascular Exercise Tolerance: Good hypertension, Pt. on medications (-) angina+ CAD, + Past MI and + CABG   Rhythm:Regular Rate:Normal - Systolic murmurs and - Diastolic murmurs    Neuro/Psych Multiple sclerosis  negative psych ROS   GI/Hepatic negative GI ROS, Neg liver ROS,   Endo/Other  neg diabetesHypothyroidism   Renal/GU negative Renal ROS     Musculoskeletal negative musculoskeletal ROS (+)   Abdominal (+) + obese,   Peds  Hematology negative hematology ROS (+)   Anesthesia Other Findings Past Medical History: 2018: Blackout     Comment: several episodes with unknown etiology. cause               of his ankle break. 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 takes steriods and it resolves. No date: Seizures (HCC)     Comment: as a child. diagnosed with MS and once               treated, seizures resolved. No date: Sleep apnea   Reproductive/Obstetrics                             Anesthesia Physical Anesthesia Plan  ASA: III  Anesthesia Plan: General   Post-op Pain Management:    Induction: Intravenous  Airway Management Planned: Oral ETT  Additional Equipment:   Intra-op Plan:   Post-operative Plan: Extubation in OR  Informed Consent: I  have reviewed the patients History and Physical, chart, labs and discussed the procedure including the risks, benefits and alternatives for the proposed anesthesia with the patient or authorized representative who has indicated his/her understanding and acceptance.   Dental advisory given  Plan Discussed with: CRNA and Anesthesiologist  Anesthesia Plan Comments:         Anesthesia Quick Evaluation

## 2016-09-22 NOTE — Progress Notes (Signed)
Patient taken to Pre-OP via bed with bed.  Report previously givne to [facility] regarding Handoff of patient.

## 2016-09-22 NOTE — Transfer of Care (Signed)
Immediate Anesthesia Transfer of Care Note  Patient: Cody Burns  Procedure(s) Performed: Procedure(s): OPEN REDUCTION INTERNAL FIXATION (ORIF) ANKLE FRACTURE (Right)  Patient Location: PACU  Anesthesia Type:General  Level of Consciousness: sedated  Airway & Oxygen Therapy: Patient Spontanous Breathing and Patient connected to face mask oxygen  Post-op Assessment: Report given to RN and Post -op Vital signs reviewed and stable  Post vital signs: Reviewed and stable  Last Vitals:  Vitals:   09/22/16 1419 09/22/16 1712  BP: (!) 173/96 (!) 170/94  Pulse: 91 (!) 108  Resp: 17 (!) 23  Temp: 36.1 C 36.4 C    Complications: No apparent anesthesia complications

## 2016-09-22 NOTE — ED Notes (Signed)
Right leg elevated.

## 2016-09-22 NOTE — Anesthesia Postprocedure Evaluation (Signed)
Anesthesia Post Note  Patient: Cody Burns  Procedure(s) Performed: Procedure(s) (LRB): OPEN REDUCTION INTERNAL FIXATION (ORIF) ANKLE FRACTURE (Right)  Patient location during evaluation: PACU Anesthesia Type: General Level of consciousness: awake and alert Pain management: pain level controlled Vital Signs Assessment: post-procedure vital signs reviewed and stable Respiratory status: spontaneous breathing, nonlabored ventilation, respiratory function stable and patient connected to nasal cannula oxygen Cardiovascular status: blood pressure returned to baseline and stable Postop Assessment: no signs of nausea or vomiting Anesthetic complications: no     Last Vitals:  Vitals:   09/22/16 1808 09/22/16 1840  BP: (!) 152/75 (!) 144/72  Pulse: 99 94  Resp: 18 18  Temp: 36.8 C 36.9 C    Last Pain:  Vitals:   09/22/16 1840  TempSrc: Oral  PainSc:                  Shlonda Dolloff S

## 2016-09-22 NOTE — Anesthesia Procedure Notes (Signed)
Procedure Name: Intubation Date/Time: 09/22/2016 3:03 PM Performed by: Allean Found Pre-anesthesia Checklist: Patient identified, Emergency Drugs available, Suction available, Patient being monitored and Timeout performed Patient Re-evaluated:Patient Re-evaluated prior to inductionOxygen Delivery Method: Circle system utilized Preoxygenation: Pre-oxygenation with 100% oxygen Intubation Type: IV induction Ventilation: Mask ventilation without difficulty Laryngoscope Size: Mac and 4 Grade View: Grade I Tube type: Oral Tube size: 7.5 mm Number of attempts: 1 Airway Equipment and Method: Stylet Placement Confirmation: ETT inserted through vocal cords under direct vision,  positive ETCO2 and breath sounds checked- equal and bilateral Secured at: 23 cm Tube secured with: Tape Dental Injury: Teeth and Oropharynx as per pre-operative assessment

## 2016-09-22 NOTE — Op Note (Signed)
09/21/2016 - 09/22/2016  5:11 PM  Patient:   Cody Burns  Pre-Op Diagnosis:   Closed displaced trimalleolar fracture dislocation, right ankle.  Post-Op Diagnosis:   Same.  Procedure:   Open reduction and internal fixation of bimalleolar fracture, right ankle.  Surgeon:   Maryagnes Amos, MD  Assistant:   None  Anesthesia:   GET  Findings:   As above.  Complications:   None  EBL:   10 cc  Fluids:   1000 cc crystalloid  UOP:   None  TT:   80 min at 250 mmHg  Drains:   None  Closure:   Staples  Implants:   Biomet composite 7-hole locking plate and screws  Brief Clinical Note:   The patient is a 51 year old male with MS who apparently passed out yesterday afternoon in his kitchen and fell to the ground, injuring his right ankle. He was brought to the emergency room where x-rays demonstrated the above-noted injury. The patient underwent closed reduction and splinting of the fracture before being admitted for this problem. The patient presents at this time for definitive management of his injury.  Procedure:   The patient was brought into the operating room and lain in the supine position. After adequate general endotracheal intubation and anesthesia were obtained, the right foot and lower leg were prepped with ChloraPrep solution, then draped sterilely. Preoperative antibiotics were administered. A timeout was performed to verify the appropriate surgical site before the limb was exsanguinated with an Esmarch and the calf tourniquet inflated to 250 mmHg. Laterally, a 7-8 cm incision was made over the lateral aspect of the distal fibula. The incision was carried down through the subcutaneous tissues to expose the fracture site. The fracture hematoma was debrided before the fracture was reduced and temporarily secured using a bone clamp. Two lag screws were placed in an anterior to posterior direction perpendicular to the fracture. A 7-hole Biomet ALPS composite locking plate was  applied over the lateral aspect of the distal fibula. After verifying its position fluoroscopically, it was secured using a 3.5 mm nonlocking cortical screw proximal to the fracture. Again the plate's position was adjusted slightly based on AP and lateral projections before it was secured using two additional bicortical screws proximally and three locking screws distally. The adequacy of fracture reduction and hardware position was verified fluoroscopically in AP and lateral projections and found to be excellent.  Attention was directed to the medial side. An approximately 3-4 cm longitudinal incision was made over the medial aspect of the medial malleolus. This incision also was carried down through the subcutaneous tissues to expose the fracture site. Care was taken to identify and protect the saphenous nerve and vein. The fracture hematoma again was removed before the fracture was reduced. Two guidewires were placed obliquely across the fracture from distal to proximal into the distal tibial metaphysis. After verifying their positions fluoroscopically, each guidewire was sequentially over-reamed and replaced with a 50 mm partially threaded 4.0 cancellous screw in lag fashion. Again the adequacy of fracture reduction, hardware position, and mortise restoration was verified in AP, lateral, and oblique projections and found to be excellent.  Each wound was copiously irrigated with sterile saline solution. Laterally, the subcutaneous tissues were closed in two layers using 2-0 Vicryl interrupted sutures before the skin was closed using staples. Medially, the subcutaneous tissues were closed using 2-0 Vicryl interrupted sutures before the skin was closed using staples. A total of 30 cc of 0.5% plain Sensorcaine was injected in  and around the incision sites to help with postoperative analgesia. Sterile bulky dressings were applied to the wounds before the patient was placed into a posterior splint with a sugar tong  supplement, maintaining the ankle in neutral dorsiflexion. The patient was then awakened, extubated, and returned to the recovery room in satisfactory condition after tolerating the procedure well.

## 2016-09-22 NOTE — Clinical Social Work Note (Signed)
Clinical Social Work Assessment  Patient Details  Name: Cody Burns MRN: [id number] Date of Birth: [birth year]  Date of referral:  09/22/16               Reason for consult:  Facility Placement                Permission sought to share information with:  Chartered certified accountant granted to share information::  Yes, Verbal Permission Granted  Name::      Cody Burns::     Relationship::     Contact Information:     Housing/Transportation Living arrangements for the past 2 months:  [place] of Information:  Patient, Other (Comment Required) (Brother Company secretary ) Patient Interpreter Needed:  None Criminal Activity/Legal Involvement Pertinent to Current Situation/Hospitalization:  No - Comment as needed Significant Relationships:  Siblings Lives with:  Self Do you feel safe going back to the place where you live?  Yes Need for family participation in patient care:  Yes (Comment)  Care giving concerns:  Patient lives alone in [place].    Social Worker assessment / plan:  Holiday representative (CSW) reviewed chart and noted that patient is having surgery today for an ankle fracture. CSW met with patient prior to surgery and his brother Cody Burns was at bed side. CSW introduced self and explained role of CSW department. Patient reported that he lives alone and has MS. Per patient he has been having trouble walking the past couple of weeks and that is how he injured his ankle. 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 Patient reported that he has no children and does not have a HPOA. Per patient his brother is his primary support. CSW explained that after surgery PT will evaluate patient and make a recommendation of SNF or home health. CSW explained that in order for medicare to pay for SNF patient will require a 3 night qualifying inpatient stay at [facility]. Patient was admitted  to inpatient on 09/22/16. Patient is open to home health or SNF. Patient prefers [facility] if he goes to SNF. FL2 complete and faxed out. CSW will continue to follow and assist as needed.    Employment status:  Disabled (Comment on whether or not currently receiving Disability) Insurance information:  Medicare PT Recommendations:  Not assessed at this time Information / Referral to community resources:  [place]  Patient/Family's Response to care:  Patient is open to home health or SNF pending PT recommendation.   Patient/Family's Understanding of and Emotional Response to Diagnosis, Current Treatment, and Prognosis:  Patient and brother were very pleasant and thanked CSW for assistance.   Emotional Assessment Appearance:  Appears stated age Attitude/Demeanor/Rapport:    Affect (typically observed):  Accepting, Adaptable, Pleasant Orientation:  Oriented to Self, Oriented to Place, Oriented to  Time, Oriented to Situation Alcohol / Substance use:  Not Applicable Psych involvement (Current and /or in the community):  No (Comment)  Discharge Needs  Concerns to be addressed:  Discharge Planning Concerns Readmission within the last 30 days:  No Current discharge risk:  Dependent with Mobility Barriers to Discharge:  Continued Medical Work up   [employer], Cody Beets, LCSW 09/22/2016, 2:39 PM

## 2016-09-22 NOTE — H&P (Signed)
Subjective:  Chief complaint:  Fracture dislocation right ankle.  The patient is a 51 y.o. male with a history of hypertension, coronary artery disease, who sustained an injury to the right ankle yesterday evening while in his home. Apparently, he "passed out" and fell, injuring his right ankle. He was brought to the emergency room where x-rays demonstrated a fracture dislocation of the ankle. The ankle was reduced and splinted by the ER physician. Subsequently, the patient was admitted for definitive management of this injury. The patient denies any associated injury which may have resulted from this fall, and denies any chest pain or shortness of breath which might have contributed to the injury.  Patient Active Problem List   Diagnosis Date Noted  . Closed right ankle fracture 09/22/2016   Past Medical History:  Diagnosis Date  . Hypertension   . Myocardial infarct [facility])     Past Surgical History:  Procedure Laterality Date  . quadruple cardiac bypass      No prescriptions prior to admission.   Allergies  Allergen Reactions  . Penicillins     Social History  Substance Use Topics  . Smoking status: Never Smoker  . Smokeless tobacco: Not on file  . Alcohol use Yes    History reviewed. No pertinent family history.   Review of Systems: As noted above. The patient denies any chest pain, shortness of breath, nausea, vomiting, diarrhea, constipation, belly pain, blood in his/her stool, or burning with urination.  Objective: Temp:  [98.2 F (36.8 C)-98.3 F (36.8 C)] 98.2 F (36.8 C) (05/01 0255) Pulse Rate:  [60-73] 73 (05/01 0255) Resp:  [13-22] 18 (05/01 0255) BP: (107-161)/(67-97) 151/87 (05/01 0255) SpO2:  [89 %-96 %] 94 % (05/01 0255) Weight:  [113.1 kg (249 lb 4.8 oz)] 113.1 kg (249 lb 4.8 oz) (05/01 0255)  Physical Exam: General:  Alert, no acute distress Psychiatric:  Patient is competent for consent with normal mood and affect  Cardiovascular:  RRR   Respiratory:  Clear to auscultation. No wheezing. Non-labored breathing GI:  Abdomen is soft and non-tender Skin:  No lesions in the area of chief complaint Neurologic:  Sensation intact distally Lymphatic:  No axillary or cervical lymphadenopathy  Orthopedic Exam:  Orthopedic examination is limited to the right lower extremity and foot. The patient's right lower leg is in a short-leg posterior splint. The splint appears to be dry and intact. Skin inspection is unremarkable in the proximal distal extents of the splint, other than some swelling noted in the forefoot. He is able to actively dorsiflex and plantarflex his toes. Sensation is intact to the dorsal forefoot region and as well as to the plantar aspect of his forefoot. He has excellent capillary refill to all digits.  Imaging Review: Recent pre-and postreduction x-rays of the right ankle are available for review.  These films demonstrate a displaced trimalleolar fracture dislocation of the right ankle with subsequent near anatomic reduction. No significant degenerative changes are noted. No lytic lesions are identified.  Assessment: Closed trimalleolar fracture dislocation right ankle.  Plan: The treatment options, including both surgical and nonsurgical management choices, have been discussed in detail with the patient. The patient would prefer to proceed with surgical management of his injury to include open reduction and internal fixation of the right ankle fracture, which would be the preferred option. The risks (including bleeding, infection, nerve and/or blood vessel injury, persistent or recurrent pain, malunion and/or nonunion, stiffness, development of arthritis, need for further surgery, blood clots, strokes, heart attacks  or arrhythmias, pneumonia, etc.) and benefits of this procedure were discussed. The patient states his understanding and agrees to proceed. A formal written consent will be obtained by the nursing staff.

## 2016-09-22 NOTE — OR Nursing (Signed)
Patient arrived in holding in SDS. Discussed recent blackout spells happening more frequently. He is scheduled to see his neurologist at the [place] on Thursday to begin workup as to why this happens.  He stated that he gets woozy in the head, "swimmy" and his legs begin to shake and won't hold him up. Denies being dehydrated, taking any recreational drugs. History of seizures as a child but none since then. When he blacks out, he does not urinate on self but he is not sure if he has hit his head or not. Concerned that if he is to go to ALF, then he may miss this appointment with his neurologist. Encouraged him to discuss this with his brother as he would know the name and number to change his appointment day and time.  Patient does have tremors in his hands. Sometimes uses a walker or a cane but is able to walk on his own. Lives alone on one lower floor.Dr. Joice Lofts aware of the appointment for Thursday.

## 2016-09-23 ENCOUNTER — Encounter: Payer: Self-pay | Admitting: Surgery

## 2016-09-23 LAB — BASIC METABOLIC PANEL
Anion gap: 3 — ABNORMAL LOW (ref 5–15)
BUN: 11 mg/dL (ref 6–20)
CALCIUM: 8.4 mg/dL — AB (ref 8.9–10.3)
CO2: 26 mmol/L (ref 22–32)
CREATININE: 1.13 mg/dL (ref 0.61–1.24)
Chloride: 109 mmol/L (ref 101–111)
GFR calc Af Amer: >60 mL/min (ref >60)
GFR calc non Af Amer: >60 mL/min (ref >60)
Glucose, Bld: 97 mg/dL (ref 65–99)
Potassium: 3.4 mmol/L — ABNORMAL LOW (ref 3.5–5.1)
SODIUM: 138 mmol/L (ref 135–145)

## 2016-09-23 LAB — CBC WITH DIFFERENTIAL/PLATELET
BASOS ABS: 0 10*3/uL (ref 0–0.1)
BASOS PCT: 0 %
Eosinophils Absolute: 0.1 10*3/uL (ref 0–0.7)
Eosinophils Relative: 2 %
HEMATOCRIT: 35 % — AB (ref 40.0–52.0)
HEMOGLOBIN: 11.8 g/dL — AB (ref 13.0–18.0)
LYMPHS PCT: 26 %
Lymphs Abs: 1.7 10*3/uL (ref 1.0–3.6)
MCH: 31.6 pg (ref 26.0–34.0)
MCHC: 33.8 g/dL (ref 32.0–36.0)
MCV: 93.4 fL (ref 80.0–100.0)
Monocytes Absolute: 0.6 10*3/uL (ref 0.2–1.0)
Monocytes Relative: 9 %
NEUTROS ABS: 4.1 10*3/uL (ref 1.4–6.5)
Neutrophils Relative %: 63 %
Platelets: 220 10*3/uL (ref 150–440)
RBC: 3.74 MIL/uL — ABNORMAL LOW (ref 4.40–5.90)
RDW: 13.8 % (ref 11.5–14.5)
WBC: 6.5 10*3/uL (ref 3.8–10.6)

## 2016-09-23 MED ORDER — POTASSIUM CHLORIDE CRYS ER 20 MEQ PO TBCR
20.0000 meq | EXTENDED_RELEASE_TABLET | Freq: Once | ORAL | Status: DC
  Filled 2016-09-23: qty 1

## 2016-09-23 MED ORDER — OXYCODONE HCL 5 MG PO TABS: 5.0000 mg | ORAL_TABLET | ORAL | 0 refills | Status: DC | PRN

## 2016-09-23 NOTE — Progress Notes (Signed)
Pt ready for discharge home with Brother. Family informed that pt will need 24 hour supervision and assistance at this time. [facility] nurse and an aid. Reviewed all discharge instructions and prescriptions. Pt and Brother verify their understanding.

## 2016-09-23 NOTE — Evaluation (Signed)
Physical Therapy Evaluation Patient Details Name: Cody Burns MRN: [id number] DOB: Oct 20, 1965 Today's Date: 09/23/2016   History of Present Illness  Pt admitted for R ankle fracture secondary to fall from passing out and is now s/p R ORIF of distal tibia/fibula. History includes HTN, MS, MI, seizure, sleep apnea, and CAD.   Clinical Impression  Pt is a pleasant 51 year old male who was admitted for R ankle ORIF. Pt performs bed mobility with mod I, transfers with cga, and ambulation with min assist. Pt demonstrates deficits with strength/mobility/balance/mobility. Pt is very unsteady with mobility, no reports of dizziness. Pt is not safe to be home alone and reports brother could possibly help out at home. Due to unsteadiness, would recommend transfers in/out of WC to improve mobility. Pt has no stairs and could safely enter/exit home.  Would benefit from skilled PT to address above deficits and promote optimal return to PLOF. Recommend transition to HHPT upon discharge from acute hospitalization.       Follow Up Recommendations Home health PT;Supervision for mobility/OOB    Equipment Recommendations  Wheelchair (measurements PT) (with extended leg rest for R LE)    Recommendations for Other Services       Precautions / Restrictions Precautions Precautions: Fall Restrictions Weight Bearing Restrictions: Yes RLE Weight Bearing: Non weight bearing      Mobility  Bed Mobility Overal bed mobility: Modified Independent             General bed mobility comments: safe technique performed with use of rails. Upright posture noted.  Transfers Overall transfer level: Needs assistance Equipment used: Rolling walker (2 wheeled) Transfers: Sit to/from Stand Sit to Stand: Min guard         General transfer comment: able to stand with upright posture with cues for technique. Able to maintain correct WB status. Pt intially unsteady during transition to standing, however no overt  LOB  Ambulation/Gait Ambulation/Gait assistance: Min assist Ambulation Distance (Feet): 3 Feet Assistive device: Rolling walker (2 wheeled) Gait Pattern/deviations: Step-to pattern     General Gait Details: ambulated to recliner with min assist secondary to unsteadiness noted. Needs assist to maintain balance and cues for keeping WBing. No overt LOB noted.  Stairs            Wheelchair Mobility    Modified Rankin (Stroke Patients Only)       Balance Overall balance assessment: Needs assistance Sitting-balance support: Feet supported;No upper extremity supported Sitting balance-Leahy Scale: Normal     Standing balance support: Bilateral upper extremity supported Standing balance-Leahy Scale: Fair                               Pertinent Vitals/Pain Pain Assessment: Faces Faces Pain Scale: Hurts little more Pain Location: R ankle Pain Descriptors / Indicators: Operative site guarding Pain Intervention(s): Limited activity within patient's tolerance;Premedicated before session;Repositioned    Home Living Family/patient expects to be discharged to:: Private residence Living Arrangements: Alone Available Help at Discharge: Family (brother could possibly stay to help-pt will check) Type of Home: Apartment Home Access: Level entry     Home Layout: One level Home Equipment: Walker - 2 wheels;Walker - 4 wheels;Cane - single point;Electric scooter      Prior Function Level of Independence: Independent with assistive device(s)         Comments: occassionally uses SPC for mobility, however is very indep. drives and takes care of all ADLs.  Hand Dominance        Extremity/Trunk Assessment   Upper Extremity Assessment Upper Extremity Assessment: Overall WFL for tasks assessed    Lower Extremity Assessment Lower Extremity Assessment: Generalized weakness (R LE grossly 3+/5; L LE grossly 5/5)       Communication   Communication: No  difficulties  Cognition Arousal/Alertness: Awake/alert Behavior During Therapy: WFL for tasks assessed/performed Overall Cognitive Status: Within Functional Limits for tasks assessed                                        General Comments      Exercises Other Exercises Other Exercises: Supine ther-ex performed on B LE including SLRs, hip abd/add, and quad sets. All ther-ex performed x 10 reps with supervision and cues for correct technique.   Assessment/Plan    PT Assessment Patient needs continued PT services  PT Problem List Decreased strength;Decreased activity tolerance;Decreased balance;Decreased mobility;Pain;Decreased safety awareness       PT Treatment Interventions DME instruction;Gait training;Therapeutic exercise;Wheelchair mobility training    PT Goals (Current goals can be found in the Care Plan section)  Acute Rehab PT Goals Patient Stated Goal: to go home PT Goal Formulation: With patient Time For Goal Achievement: 10/07/16 Potential to Achieve Goals: Good    Frequency BID   Barriers to discharge Decreased caregiver support      Co-evaluation               AM-PAC PT "6 Clicks" Daily Activity  Outcome Measure Difficulty turning over in bed (including adjusting bedclothes, sheets and blankets)?: None Difficulty moving from lying on back to sitting on the side of the bed? : None Difficulty sitting down on and standing up from a chair with arms (e.g., wheelchair, bedside commode, etc,.)?: A Little Help needed moving to and from a bed to chair (including a wheelchair)?: A Little Help needed walking in hospital room?: A Little Help needed climbing 3-5 steps with a railing? : A Lot 6 Click Score: 19    End of Session Equipment Utilized During Treatment: Gait belt Activity Tolerance: Patient tolerated treatment well Patient left: in chair;with chair alarm set Nurse Communication: Mobility status PT Visit Diagnosis: Unsteadiness on feet  (R26.81);Repeated falls (R29.6);Muscle weakness (generalized) (M62.81);History of falling (Z91.81);Pain Pain - Right/Left: Right Pain - part of body: Ankle and joints of foot    Time: 6060-0459 PT Time Calculation (min) (ACUTE ONLY): 19 min   Charges:   PT Evaluation $PT Eval Low Complexity: 1 Procedure PT Treatments $Therapeutic Exercise: 8-22 mins   PT G Codes:        Elizabeth Palau, PT, DPT [phone]   Reyhan Moronta 09/23/2016, 11:17 AM

## 2016-09-23 NOTE — Care Management Important Message (Signed)
Important Message  Patient Details  Name: Cody Burns MRN: [id number] Date of Birth: 01/13/1966   Medicare Important Message Given:  Yes    Marily Memos, RN 09/23/2016, 10:05 AM

## 2016-09-23 NOTE — Care Management (Signed)
Faxed DC summary to [place] at the [place] [phone])

## 2016-09-23 NOTE — Discharge Instructions (Signed)
NSTRUCTIONS AFTER Surgery  o Remove items at home which could result in a fall. This includes throw rugs or furniture in walking pathways o ICE to the affected joint every three hours while awake for 30 minutes at a time, for at least the first 3-5 days, and then as needed for pain and swelling.  Continue to use ice for pain and swelling. You may notice swelling that will progress down to the foot and ankle.  This is normal after surgery.  Elevate your leg when you are not up walking on it.   o Continue to use the breathing machine you got in the hospital (incentive spirometer) which will help keep your temperature down.  It is common for your temperature to cycle up and down following surgery, especially at night when you are not up moving around and exerting yourself.  The breathing machine keeps your lungs expanded and your temperature down.  DIET:  As you were doing prior to hospitalization, we recommend a well-balanced diet.  DRESSING / WOUND CARE / SHOWERING -Remain in dressing, don't change. -Keep dry  ACTIVITY  o Increase activity slowly as tolerated, but follow the weight bearing instructions below.   o No driving for 6 weeks or until further direction given by your physician.  You cannot drive while taking narcotics.  o No lifting or carrying greater than 10 lbs. until further directed by your surgeon. o Avoid periods of inactivity such as sitting longer than an hour when not asleep. This helps prevent blood clots.  o You may return to work once you are authorized by your doctor.   WEIGHT BEARING  -NO WEIGHTBEARING TO THE RIGHT LEG.  EXERCISES -Per PT  CONSTIPATION  Constipation is defined medically as fewer than three stools per week and severe constipation as less than one stool per week.  Even if you have a regular bowel pattern at home, your normal regimen is likely to be disrupted due to multiple reasons following surgery.  Combination of anesthesia, postoperative  narcotics, change in appetite and fluid intake all can affect your bowels.   YOU MUST use at least one of the following options; they are listed in order of increasing strength to get the job done.  They are all available over the counter, and you may need to use some, POSSIBLY even all of these options:    Drink plenty of fluids (prune juice may be helpful) and high fiber foods Colace 100 mg by mouth twice a day  Senokot for constipation as directed and as needed Dulcolax (bisacodyl), take with full glass of water  Miralax (polyethylene glycol) once or twice a day as needed.  If you have tried all these things and are unable to have a bowel movement in the first 3-4 days after surgery call either your surgeon or your primary doctor.    If you experience loose stools or diarrhea, hold the medications until you stool forms back up.  If your symptoms do not get better within 1 week or if they get worse, check with your doctor.  If you experience "the worst abdominal pain ever" or develop nausea or vomiting, please contact the office immediately for further recommendations for treatment.   ITCHING:  If you experience itching with your medications, try taking only a single pain pill, or even half a pain pill at a time.  You can also use Benadryl over the counter for itching or also to help with sleep.   TED HOSE STOCKINGS:  Use stockings on both legs until for at least 2 weeks or as directed by physician office. They may be removed at night for sleeping.  MEDICATIONS:  See your medication summary on the After Visit Summary that nursing will review with you.  You may have some home medications which will be placed on hold until you complete the course of blood thinner medication.  It is important for you to complete the blood thinner medication as prescribed.  PRECAUTIONS:  If you experience chest pain or shortness of breath - call 911 immediately for transfer to the hospital emergency department.     If you develop a fever greater that 101 F, purulent drainage from wound, increased redness or drainage from wound, foul odor from the wound/dressing, or calf pain - CONTACT YOUR SURGEON.                                                   FOLLOW-UP APPOINTMENTS:  If you do not already have a post-op appointment, please call the office for an appointment to be seen by your surgeon.  Guidelines for how soon to be seen are listed in your After Visit Summary, but are typically between 1-4 weeks after surgery.  OTHER INSTRUCTIONS:  -Continue ASA 81mg  daily for DVT Prophylaxis.  MAKE SURE YOU:   Understand these instructions.   Get help right away if you are not doing well or get worse.   Thank you for letting us be a part of your medical care team.  It is a privilege we respect greatly.  We hope these instructions will help you stay on track for a fast and full recovery!

## 2016-09-23 NOTE — Plan of Care (Signed)
Problem: Safety: Goal: Ability to remain free from injury will improve Outcome: Progressing Pt alert and oriented. Bed alarm activated. Pt understands that he must ring his bell for assistance when getting up out of bed.  Problem: Pain Managment: Goal: General experience of comfort will improve Outcome: Progressing Pain control with oral pain medication.  Problem: Skin Integrity: Goal: Risk for impaired skin integrity will decrease Outcome: Progressing Dressing remaining dry and intact.  Problem: Fluid Volume: Goal: Ability to maintain a balanced intake and output will improve Outcome: Progressing Voiding in urinal without difficulty.  Problem: Nutrition: Goal: Adequate nutrition will be maintained Outcome: Progressing Eating and drinking without difficulty.

## 2016-09-23 NOTE — Progress Notes (Signed)
Subjective: 1 Day Post-Op Procedure(s) (LRB): OPEN REDUCTION INTERNAL FIXATION (ORIF) ANKLE FRACTURE (Right) Patient reports pain as mild.   Patient is well, and has had no acute complaints or problems Plan is to go Home after hospital stay. Negative for chest pain and shortness of breath Fever: no Gastrointestinal:None for nausea and vomiting  Objective: Vital signs in last 24 hours: Temp:  [97 F (36.1 C)-98.8 F (37.1 C)] 97.9 F (36.6 C) (05/02 0420) Pulse Rate:  [70-108] 70 (05/02 0420) Resp:  [16-23] 16 (05/02 0420) BP: (94-173)/(54-96) 119/74 (05/02 0420) SpO2:  [92 %-100 %] 100 % (05/02 0420) Weight:  [108 kg (238 lb)] 108 kg (238 lb) (05/01 1419)  Intake/Output from previous day:  Intake/Output Summary (Last 24 hours) at 09/23/16 0800 Last data filed at 09/23/16 0617  Gross per 24 hour  Intake          1833.33 ml  Output             1310 ml  Net           523.33 ml    Intake/Output this shift: No intake/output data recorded.  Labs:  Recent Labs  09/21/16 2019 09/23/16 0447  HGB 14.1 11.8*    Recent Labs  09/21/16 2019 09/23/16 0447  WBC 9.5 6.5  RBC 4.60 3.74*  HCT 42.0 35.0*  PLT 363 220    Recent Labs  09/21/16 2019 09/23/16 0447  NA 139 138  K 4.0 3.4*  CL 106 109  CO2 22 26  BUN 19 11  CREATININE 1.48* 1.13  GLUCOSE 95 97  CALCIUM 9.3 8.4*    Recent Labs  09/21/16 2019  INR 0.93   EXAM General - Patient is Alert, Appropriate and Oriented Extremity - ABD soft Sensation intact distally Intact pulses distally Dorsiflexion/Plantar flexion intact No cellulitis present Dressing/Incision - clean, dry, no drainage Motor Function - intact, moving foot and toes well on exam. Patient intact to light touch over the dorsal and volar aspect of the foot, flexing and extending toes without difficulty.  Past Medical History:  Diagnosis Date  . 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 cause of his ankle break. awaiting  neuro workup  . Hypertension   . Hypothyroidism   . Multiple sclerosis (HCC) 1990  . Myocardial infarct (HCC)   . Optic neuritis    x 3. takes steriods and it resolves.  . Seizures (HCC)    as a child. diagnosed with MS and once treated, seizures resolved.  . Sleep apnea     Assessment/Plan: 1 Day Post-Op Procedure(s) (LRB): OPEN REDUCTION INTERNAL FIXATION (ORIF) ANKLE FRACTURE (Right) Active Problems:   Closed right ankle fracture  Estimated body mass index is 32.28 kg/m as calculated from the following:   Height as of this encounter: 6' (1.829 m).   Weight as of this encounter: 108 kg (238 lb). Advance diet Up with therapy D/C IV fluids when tolerating po intake.  Up with therapy today, if tolerated PT will plan on discharge home. Labs reviewed, K+ 3.4, will supplement. Plan will be for discharge home pending therapy today.  DVT Prophylaxis - Lovenox, Foot Pumps and TED hose Non-weightbearing to the right lower extremity.  Valeria Batman, PA-C [facility] 09/23/2016, 8:00 AM

## 2016-09-23 NOTE — Care Management Note (Signed)
Case Management Note  Patient Details  Name: Karen Kinnard MRN: [id number] Date of Birth: [birth year]  Subjective/Objective   Met with patient and his brother at bedside. He has an appointment at the [facility] with is neurologist tomorrow and plans to go. He would like to have them set up home health and also have the [facility] get him a wheelchair because "they do it all for free". Offered to set it up and he declined. No further needs identified.                Action/Plan:   Expected Discharge Date:  09/23/16               Expected Discharge Plan:  Home/Self Care  In-House Referral:     Discharge planning Services  CM Consult  Post Acute Care Choice:  Home Health, Durable Medical Equipment Choice offered to:  Patient  DME Arranged:    DME Agency:     HH Arranged:  Patient Refused [place] Agency:     Status of Service:  Completed, signed off  If discussed at [employer] of Stay Meetings, dates discussed:    Additional Comments:  Jolly Mango, RN 09/23/2016, 11:38 AM

## 2016-09-23 NOTE — Discharge Summary (Signed)
Physician Discharge Summary  Patient ID: Cody Burns MRN: [id number] DOB/AGE: [birth year] 51 y.o.  Admit date: 09/21/2016 Discharge date: 09/23/2016  Admission Diagnoses:  Closed fracture of right ankle, initial encounter [S82.891A] Ankle dislocation, right, initial encounter [S93.04XA] Closed displaced trimalleolar fracture dislocation, right ankle.  Discharge Diagnoses: Patient Active Problem List   Diagnosis Date Noted  . Closed right ankle fracture 09/22/2016  Closed displaced trimalleolar fracture dislocation, right ankle.  Past Medical History:  Diagnosis Date  . 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 cause of his ankle break. awaiting neuro workup  . Hypertension   . Hypothyroidism   . Multiple sclerosis (HCC) 1990  . Myocardial infarct (HCC)   . Optic neuritis    x 3. takes steriods and it resolves.  . Seizures (HCC)    as a child. diagnosed with MS and once treated, seizures resolved.  . Sleep apnea    Transfusion: None   Consultants (if any):   Discharged Condition: Improved  Hospital Course: Cody Burns is an 51 y.o. male who was admitted 09/21/2016 with a diagnosis of a closed, displaced trimalleolar fracture dislocation of the right ankle and went to the operating room on 09/21/2016 - 09/22/2016 and underwent the above named procedures.    Surgeries: Procedure(s): OPEN REDUCTION INTERNAL FIXATION (ORIF) ANKLE FRACTURE on 09/21/2016 - 09/22/2016 Patient tolerated the surgery well. Taken to PACU where she was stabilized and then transferred to the orthopedic floor.  Started on Lovenox 40mg  q 24 hrs. Foot pumps applied bilaterally at 80 mm. Heels elevated on bed with rolled towels. No evidence of DVT. Negative Homan. Physical therapy started on day #1 for gait training and transfer. OT started day #1 for ADL and assisted devices.  Patient's IV was d/c on POD1.  Implants: Biomet composite 7-hole locking plate and screws.  He was given  perioperative antibiotics:  Anti-infectives    Start     Dose/Rate Route Frequency Ordered Stop   09/22/16 2130  clindamycin (CLEOCIN) IVPB 600 mg     600 mg 100 mL/hr over 30 Minutes Intravenous Every 6 hours 09/22/16 1807 09/23/16 1529   09/22/16 1400  clindamycin (CLEOCIN) IVPB 900 mg     900 mg 100 mL/hr over 30 Minutes Intravenous  Once 09/22/16 0023 09/22/16 1823    .  He was given sequential compression devices, early ambulation, and Lovenox for DVT prophylaxis.  He benefited maximally from the hospital stay and there were no complications.    Recent vital signs:  Vitals:   09/23/16 0012 09/23/16 0420  BP: (!) 102/54 119/74  Pulse:  70  Resp:  16  Temp:  97.9 F (36.6 C)    Recent laboratory studies:  Lab Results  Component Value Date   HGB 11.8 (L) 09/23/2016   HGB 14.1 09/21/2016   Lab Results  Component Value Date   WBC 6.5 09/23/2016   PLT 220 09/23/2016   Lab Results  Component Value Date   INR 0.93 09/21/2016   Lab Results  Component Value Date   NA 138 09/23/2016   K 3.4 (L) 09/23/2016   CL 109 09/23/2016   CO2 26 09/23/2016   BUN 11 09/23/2016   CREATININE 1.13 09/23/2016   GLUCOSE 97 09/23/2016    Discharge Medications:   Allergies as of 09/23/2016      Reactions   Penicillins Other (See Comments)   Sores in mouth.      Medication List    TAKE these medications   amLODipine  10 MG tablet Commonly known as:  NORVASC Take 5 mg by mouth daily.   aspirin EC 81 MG tablet Take 81 mg by mouth daily.   atorvastatin 80 MG tablet Commonly known as:  LIPITOR Take 80 mg by mouth at bedtime.   baclofen 10 MG tablet Commonly known as:  LIORESAL Take 10 mg by mouth 3 (three) times daily.   buPROPion 300 MG 24 hr tablet Commonly known as:  WELLBUTRIN XL Take 300 mg by mouth daily.   calcium-vitamin D 500-200 MG-UNIT tablet Commonly known as:  OSCAL WITH D Take 1 tablet by mouth daily.   cyanocobalamin 1000 MCG tablet Take 1,000 mcg by  mouth daily.   escitalopram 10 MG tablet Commonly known as:  LEXAPRO Take 20 mg by mouth daily.   gabapentin 300 MG capsule Commonly known as:  NEURONTIN Take 900 mg by mouth 4 (four) times daily.   levothyroxine 25 MCG tablet Commonly known as:  SYNTHROID, LEVOTHROID Take 75 mcg by mouth daily before breakfast.   metoprolol 200 MG 24 hr tablet Commonly known as:  TOPROL-XL Take 100 mg by mouth daily.   montelukast 10 MG tablet Commonly known as:  SINGULAIR Take 10 mg by mouth at bedtime.   oxyCODONE 5 MG immediate release tablet Commonly known as:  Oxy IR/ROXICODONE Take 1-2 tablets (5-10 mg total) by mouth every 4 (four) hours as needed for breakthrough pain.   QUEtiapine 200 MG tablet Commonly known as:  SEROQUEL Take 100 mg by mouth at bedtime.   ranitidine 150 MG tablet Commonly known as:  ZANTAC Take 150 mg by mouth at bedtime.   sennosides-docusate sodium 8.6-50 MG tablet Commonly known as:  SENOKOT-S Take 2 tablets by mouth 2 (two) times daily.   TECFIDERA 240 MG Cpdr Generic drug:  Dimethyl Fumarate Take 240 mg by mouth 2 (two) times daily.   traMADol 50 MG tablet Commonly known as:  ULTRAM Take 50 mg by mouth 2 (two) times daily as needed for moderate pain or severe pain.            Durable Medical Equipment        Start     Ordered   09/22/16 1808  DME 3 n 1  Once     09/22/16 1807   09/22/16 1808  DME Bedside commode  Once    Question:  Patient needs a bedside commode to treat with the following condition  Answer:  Closed right ankle fracture   09/22/16 1807   09/22/16 1808  DME Walker rolling  Once    Question:  Patient needs a walker to treat with the following condition  Answer:  Closed right ankle fracture   09/22/16 1807      Diagnostic Studies: Dg Tibia/fibula Right  Result Date: 09/21/2016 CLINICAL DATA:  Slip and fall injury while washing dishes tonight. EXAM: RIGHT TIBIA AND FIBULA - 2 VIEW COMPARISON:  09/21/2016 at 20:13  FINDINGS: Images obtained through fiberglass demonstrate successful reduction of the tibiotalar dislocation. 1/2 shaft width lateral displacement persists about the oblique distal diaphyseal fibular fracture. Mild lateral displacement also persists at the transverse medial malleolar fracture. Mild displacement and articular surface step-off persist at the posterior malleolar fracture. Good alignment. The proximal and midportions of the tibia and fibula are intact. IMPRESSION: Post reduction. Electronically Signed   By: Ellery Plunk M.D.   On: 09/21/2016 21:05   Dg Ankle 2 Views Right  Result Date: 09/22/2016 CLINICAL DATA:  Distal tibial and fibular  fractures EXAM: RIGHT ANKLE - 2 VIEW; DG C-ARM 61-120 MIN COMPARISON:  09/21/2016 FLUOROSCOPY TIME:  Fluoroscopy Time:  22 seconds Radiation Exposure Index (if provided by the fluoroscopic device): Not available Number of Acquired Spot Images: 8 FINDINGS: Initial images again demonstrate the distal fibular and distal tibial fractures. Fixation sideplate is noted along the distal fibula with multiple fixation screws. The fracture fragments are in near anatomic alignment. Two fixation screws are noted in the distal tibia. No other focal abnormality is seen. IMPRESSION: ORIF of distal tibial and fibular fractures. Electronically Signed   By: Alcide Clever M.D.   On: 09/22/2016 16:29   Dg Ankle Complete Right  Result Date: 09/21/2016 CLINICAL DATA:  Slip and fall injury while washing dishes tonight EXAM: RIGHT ANKLE - COMPLETE 3+ VIEW COMPARISON:  09/21/2016 at 20:13 FINDINGS: Three images obtained through plaster confirm successful reduction of the tibiotalar dislocation. Lateral displacement persists at the distal fibular and medial malleolar fractures. Mild displacement and articular surface step-off persists at the posterior malleolar fracture. Subtalar joints appear intact. IMPRESSION: Post reduction. Electronically Signed   By: Ellery Plunk M.D.   On:  09/21/2016 21:07   Dg Ankle Complete Right  Result Date: 09/21/2016 CLINICAL DATA:  Right ankle pain after fall. EXAM: RIGHT ANKLE - COMPLETE 3+ VIEW COMPARISON:  None. FINDINGS: Acute, closed, trimalleolar fracture-dislocation of the ankle is identified with anteromedial dislocation of the tibial plafond relative to the talar dome, a comminuted, dorsally angulated distal diaphyseal fracture of the fibula with overriding fracture fragments, a transverse fracture of the medial malleolus and coronal fracture of the posterior malleolus. The subtalar and midfoot articulations are grossly intact. There is soft tissue swelling about the fracture. IMPRESSION: Acute, closed, trimalleolar fracture-dislocation of the ankle is identified with anteromedial dislocation of the tibial plafond relative to the talar dome, a comminuted, dorsally angulated distal diaphyseal fracture of the fibula with overriding fracture fragments, a transverse fracture of the medial malleolus and coronal fracture of the posterior malleolus. Electronically Signed   By: Tollie Eth M.D.   On: 09/21/2016 20:32   Dg C-arm 1-60 Min  Result Date: 09/22/2016 CLINICAL DATA:  Distal tibial and fibular fractures EXAM: RIGHT ANKLE - 2 VIEW; DG C-ARM 61-120 MIN COMPARISON:  09/21/2016 FLUOROSCOPY TIME:  Fluoroscopy Time:  22 seconds Radiation Exposure Index (if provided by the fluoroscopic device): Not available Number of Acquired Spot Images: 8 FINDINGS: Initial images again demonstrate the distal fibular and distal tibial fractures. Fixation sideplate is noted along the distal fibula with multiple fixation screws. The fracture fragments are in near anatomic alignment. Two fixation screws are noted in the distal tibia. No other focal abnormality is seen. IMPRESSION: ORIF of distal tibial and fibular fractures. Electronically Signed   By: Alcide Clever M.D.   On: 09/22/2016 16:29   Disposition: Plan will be for discharge home later today pending progress  with PT.  Will continue on Aspirin 81mg  daily for DVT prophylaxis.  Follow-up Information    Meriel Pica, PA-C Follow up in 14 day(s).   Specialty:  Physician Assistant Why:  Mindi Slicker information: [address] [place] [address] [phone]          Signed: Meriel Pica PA-C 09/23/2016, 8:07 AM

## 2016-09-23 NOTE — Progress Notes (Signed)
PT is recommending home health and MD has discharged patient today. Clinical Education officer, museum (CSW) met with patient and his brother and made them aware that medicare will not pay for SNF because he has not had a 3 night qualifying inpatient stay. Brother reported that patient can come stay with him for a little while. Per patient he a has a neuro appointment at the [facility] tomorrow that he is going to go keep. RN case manager aware of above. Please reconsult if future social work needs arise. CSW signing off.   [employer], LCSW [phone]

## 2016-09-25 DIAGNOSIS — S82851A Displaced trimalleolar fracture of right lower leg, initial encounter for closed fracture: Secondary | ICD-10-CM | POA: Insufficient documentation

## 2016-10-05 DIAGNOSIS — Z8781 Personal history of (healed) traumatic fracture: Secondary | ICD-10-CM | POA: Diagnosis not present

## 2016-10-05 DIAGNOSIS — Z967 Presence of other bone and tendon implants: Secondary | ICD-10-CM | POA: Diagnosis not present

## 2016-10-31 DIAGNOSIS — Z9181 History of falling: Secondary | ICD-10-CM | POA: Diagnosis not present

## 2016-10-31 DIAGNOSIS — I10 Essential (primary) hypertension: Secondary | ICD-10-CM | POA: Diagnosis not present

## 2016-10-31 DIAGNOSIS — I69320 Aphasia following cerebral infarction: Secondary | ICD-10-CM | POA: Diagnosis not present

## 2016-10-31 DIAGNOSIS — I69322 Dysarthria following cerebral infarction: Secondary | ICD-10-CM | POA: Diagnosis not present

## 2016-10-31 DIAGNOSIS — I2510 Atherosclerotic heart disease of native coronary artery without angina pectoris: Secondary | ICD-10-CM | POA: Diagnosis not present

## 2016-10-31 DIAGNOSIS — S82454D Nondisplaced comminuted fracture of shaft of right fibula, subsequent encounter for closed fracture with routine healing: Secondary | ICD-10-CM | POA: Diagnosis not present

## 2016-10-31 DIAGNOSIS — M109 Gout, unspecified: Secondary | ICD-10-CM | POA: Diagnosis not present

## 2016-10-31 DIAGNOSIS — G35 Multiple sclerosis: Secondary | ICD-10-CM | POA: Diagnosis not present

## 2016-10-31 DIAGNOSIS — F329 Major depressive disorder, single episode, unspecified: Secondary | ICD-10-CM | POA: Diagnosis not present

## 2016-10-31 DIAGNOSIS — E785 Hyperlipidemia, unspecified: Secondary | ICD-10-CM | POA: Diagnosis not present

## 2016-10-31 DIAGNOSIS — S8254XD Nondisplaced fracture of medial malleolus of right tibia, subsequent encounter for closed fracture with routine healing: Secondary | ICD-10-CM | POA: Diagnosis not present

## 2016-11-02 DIAGNOSIS — S82454D Nondisplaced comminuted fracture of shaft of right fibula, subsequent encounter for closed fracture with routine healing: Secondary | ICD-10-CM | POA: Diagnosis not present

## 2016-11-02 DIAGNOSIS — I69320 Aphasia following cerebral infarction: Secondary | ICD-10-CM | POA: Diagnosis not present

## 2016-11-02 DIAGNOSIS — I69322 Dysarthria following cerebral infarction: Secondary | ICD-10-CM | POA: Diagnosis not present

## 2016-11-02 DIAGNOSIS — G35 Multiple sclerosis: Secondary | ICD-10-CM | POA: Diagnosis not present

## 2016-11-02 DIAGNOSIS — S8254XD Nondisplaced fracture of medial malleolus of right tibia, subsequent encounter for closed fracture with routine healing: Secondary | ICD-10-CM | POA: Diagnosis not present

## 2016-11-02 DIAGNOSIS — I2510 Atherosclerotic heart disease of native coronary artery without angina pectoris: Secondary | ICD-10-CM | POA: Diagnosis not present

## 2016-11-03 DIAGNOSIS — I2510 Atherosclerotic heart disease of native coronary artery without angina pectoris: Secondary | ICD-10-CM | POA: Diagnosis not present

## 2016-11-03 DIAGNOSIS — S82454D Nondisplaced comminuted fracture of shaft of right fibula, subsequent encounter for closed fracture with routine healing: Secondary | ICD-10-CM | POA: Diagnosis not present

## 2016-11-03 DIAGNOSIS — I69320 Aphasia following cerebral infarction: Secondary | ICD-10-CM | POA: Diagnosis not present

## 2016-11-03 DIAGNOSIS — G35 Multiple sclerosis: Secondary | ICD-10-CM | POA: Diagnosis not present

## 2016-11-03 DIAGNOSIS — S8254XD Nondisplaced fracture of medial malleolus of right tibia, subsequent encounter for closed fracture with routine healing: Secondary | ICD-10-CM | POA: Diagnosis not present

## 2016-11-03 DIAGNOSIS — I69322 Dysarthria following cerebral infarction: Secondary | ICD-10-CM | POA: Diagnosis not present

## 2016-11-04 DIAGNOSIS — I69320 Aphasia following cerebral infarction: Secondary | ICD-10-CM | POA: Diagnosis not present

## 2016-11-04 DIAGNOSIS — G35 Multiple sclerosis: Secondary | ICD-10-CM | POA: Diagnosis not present

## 2016-11-04 DIAGNOSIS — S8254XD Nondisplaced fracture of medial malleolus of right tibia, subsequent encounter for closed fracture with routine healing: Secondary | ICD-10-CM | POA: Diagnosis not present

## 2016-11-04 DIAGNOSIS — I2510 Atherosclerotic heart disease of native coronary artery without angina pectoris: Secondary | ICD-10-CM | POA: Diagnosis not present

## 2016-11-04 DIAGNOSIS — I69322 Dysarthria following cerebral infarction: Secondary | ICD-10-CM | POA: Diagnosis not present

## 2016-11-04 DIAGNOSIS — S82454D Nondisplaced comminuted fracture of shaft of right fibula, subsequent encounter for closed fracture with routine healing: Secondary | ICD-10-CM | POA: Diagnosis not present

## 2016-11-05 DIAGNOSIS — I2510 Atherosclerotic heart disease of native coronary artery without angina pectoris: Secondary | ICD-10-CM | POA: Diagnosis not present

## 2016-11-05 DIAGNOSIS — I69322 Dysarthria following cerebral infarction: Secondary | ICD-10-CM | POA: Diagnosis not present

## 2016-11-05 DIAGNOSIS — I69320 Aphasia following cerebral infarction: Secondary | ICD-10-CM | POA: Diagnosis not present

## 2016-11-05 DIAGNOSIS — S8254XD Nondisplaced fracture of medial malleolus of right tibia, subsequent encounter for closed fracture with routine healing: Secondary | ICD-10-CM | POA: Diagnosis not present

## 2016-11-05 DIAGNOSIS — S82454D Nondisplaced comminuted fracture of shaft of right fibula, subsequent encounter for closed fracture with routine healing: Secondary | ICD-10-CM | POA: Diagnosis not present

## 2016-11-05 DIAGNOSIS — G35 Multiple sclerosis: Secondary | ICD-10-CM | POA: Diagnosis not present

## 2016-11-06 DIAGNOSIS — I69320 Aphasia following cerebral infarction: Secondary | ICD-10-CM | POA: Diagnosis not present

## 2016-11-06 DIAGNOSIS — S82454D Nondisplaced comminuted fracture of shaft of right fibula, subsequent encounter for closed fracture with routine healing: Secondary | ICD-10-CM | POA: Diagnosis not present

## 2016-11-06 DIAGNOSIS — I2510 Atherosclerotic heart disease of native coronary artery without angina pectoris: Secondary | ICD-10-CM | POA: Diagnosis not present

## 2016-11-06 DIAGNOSIS — I69322 Dysarthria following cerebral infarction: Secondary | ICD-10-CM | POA: Diagnosis not present

## 2016-11-06 DIAGNOSIS — Z967 Presence of other bone and tendon implants: Secondary | ICD-10-CM | POA: Diagnosis not present

## 2016-11-06 DIAGNOSIS — S8254XD Nondisplaced fracture of medial malleolus of right tibia, subsequent encounter for closed fracture with routine healing: Secondary | ICD-10-CM | POA: Diagnosis not present

## 2016-11-06 DIAGNOSIS — G35 Multiple sclerosis: Secondary | ICD-10-CM | POA: Diagnosis not present

## 2016-11-06 DIAGNOSIS — Z8781 Personal history of (healed) traumatic fracture: Secondary | ICD-10-CM | POA: Diagnosis not present

## 2016-11-09 DIAGNOSIS — G35 Multiple sclerosis: Secondary | ICD-10-CM | POA: Diagnosis not present

## 2016-11-09 DIAGNOSIS — S82454D Nondisplaced comminuted fracture of shaft of right fibula, subsequent encounter for closed fracture with routine healing: Secondary | ICD-10-CM | POA: Diagnosis not present

## 2016-11-09 DIAGNOSIS — I2510 Atherosclerotic heart disease of native coronary artery without angina pectoris: Secondary | ICD-10-CM | POA: Diagnosis not present

## 2016-11-09 DIAGNOSIS — I69322 Dysarthria following cerebral infarction: Secondary | ICD-10-CM | POA: Diagnosis not present

## 2016-11-09 DIAGNOSIS — I69320 Aphasia following cerebral infarction: Secondary | ICD-10-CM | POA: Diagnosis not present

## 2016-11-09 DIAGNOSIS — S8254XD Nondisplaced fracture of medial malleolus of right tibia, subsequent encounter for closed fracture with routine healing: Secondary | ICD-10-CM | POA: Diagnosis not present

## 2016-11-10 DIAGNOSIS — I69320 Aphasia following cerebral infarction: Secondary | ICD-10-CM | POA: Diagnosis not present

## 2016-11-10 DIAGNOSIS — G35 Multiple sclerosis: Secondary | ICD-10-CM | POA: Diagnosis not present

## 2016-11-10 DIAGNOSIS — S8254XD Nondisplaced fracture of medial malleolus of right tibia, subsequent encounter for closed fracture with routine healing: Secondary | ICD-10-CM | POA: Diagnosis not present

## 2016-11-10 DIAGNOSIS — I69322 Dysarthria following cerebral infarction: Secondary | ICD-10-CM | POA: Diagnosis not present

## 2016-11-10 DIAGNOSIS — S82454D Nondisplaced comminuted fracture of shaft of right fibula, subsequent encounter for closed fracture with routine healing: Secondary | ICD-10-CM | POA: Diagnosis not present

## 2016-11-10 DIAGNOSIS — I2510 Atherosclerotic heart disease of native coronary artery without angina pectoris: Secondary | ICD-10-CM | POA: Diagnosis not present

## 2016-11-12 DIAGNOSIS — R1312 Dysphagia, oropharyngeal phase: Secondary | ICD-10-CM | POA: Diagnosis not present

## 2016-11-12 DIAGNOSIS — Z8673 Personal history of transient ischemic attack (TIA), and cerebral infarction without residual deficits: Secondary | ICD-10-CM | POA: Diagnosis not present

## 2016-11-12 DIAGNOSIS — R29810 Facial weakness: Secondary | ICD-10-CM | POA: Diagnosis not present

## 2016-11-12 DIAGNOSIS — Z66 Do not resuscitate: Secondary | ICD-10-CM | POA: Diagnosis present

## 2016-11-12 DIAGNOSIS — I2510 Atherosclerotic heart disease of native coronary artery without angina pectoris: Secondary | ICD-10-CM | POA: Diagnosis present

## 2016-11-12 DIAGNOSIS — I6523 Occlusion and stenosis of bilateral carotid arteries: Secondary | ICD-10-CM | POA: Diagnosis not present

## 2016-11-12 DIAGNOSIS — I6521 Occlusion and stenosis of right carotid artery: Secondary | ICD-10-CM | POA: Diagnosis not present

## 2016-11-12 DIAGNOSIS — Z79899 Other long term (current) drug therapy: Secondary | ICD-10-CM | POA: Diagnosis not present

## 2016-11-12 DIAGNOSIS — R531 Weakness: Secondary | ICD-10-CM | POA: Diagnosis not present

## 2016-11-12 DIAGNOSIS — I959 Hypotension, unspecified: Secondary | ICD-10-CM | POA: Diagnosis not present

## 2016-11-12 DIAGNOSIS — E7889 Other lipoprotein metabolism disorders: Secondary | ICD-10-CM | POA: Diagnosis not present

## 2016-11-12 DIAGNOSIS — I6200 Nontraumatic subdural hemorrhage, unspecified: Secondary | ICD-10-CM | POA: Diagnosis not present

## 2016-11-12 DIAGNOSIS — I6522 Occlusion and stenosis of left carotid artery: Secondary | ICD-10-CM | POA: Diagnosis not present

## 2016-11-12 DIAGNOSIS — F419 Anxiety disorder, unspecified: Secondary | ICD-10-CM | POA: Diagnosis present

## 2016-11-12 DIAGNOSIS — R4789 Other speech disturbances: Secondary | ICD-10-CM | POA: Diagnosis not present

## 2016-11-12 DIAGNOSIS — I6529 Occlusion and stenosis of unspecified carotid artery: Secondary | ICD-10-CM | POA: Diagnosis not present

## 2016-11-12 DIAGNOSIS — I6509 Occlusion and stenosis of unspecified vertebral artery: Secondary | ICD-10-CM | POA: Diagnosis not present

## 2016-11-12 DIAGNOSIS — F329 Major depressive disorder, single episode, unspecified: Secondary | ICD-10-CM | POA: Diagnosis present

## 2016-11-12 DIAGNOSIS — I63233 Cerebral infarction due to unspecified occlusion or stenosis of bilateral carotid arteries: Secondary | ICD-10-CM | POA: Diagnosis present

## 2016-11-12 DIAGNOSIS — Z7982 Long term (current) use of aspirin: Secondary | ICD-10-CM | POA: Diagnosis not present

## 2016-11-12 DIAGNOSIS — M6250 Muscle wasting and atrophy, not elsewhere classified, unspecified site: Secondary | ICD-10-CM | POA: Diagnosis not present

## 2016-11-12 DIAGNOSIS — R51 Headache: Secondary | ICD-10-CM | POA: Diagnosis not present

## 2016-11-12 DIAGNOSIS — R4701 Aphasia: Secondary | ICD-10-CM | POA: Diagnosis present

## 2016-11-12 DIAGNOSIS — R4189 Other symptoms and signs involving cognitive functions and awareness: Secondary | ICD-10-CM | POA: Diagnosis not present

## 2016-11-12 DIAGNOSIS — G451 Carotid artery syndrome (hemispheric): Secondary | ICD-10-CM | POA: Diagnosis present

## 2016-11-12 DIAGNOSIS — E669 Obesity, unspecified: Secondary | ICD-10-CM | POA: Diagnosis present

## 2016-11-12 DIAGNOSIS — Z951 Presence of aortocoronary bypass graft: Secondary | ICD-10-CM | POA: Diagnosis not present

## 2016-11-12 DIAGNOSIS — K5903 Drug induced constipation: Secondary | ICD-10-CM | POA: Diagnosis not present

## 2016-11-12 DIAGNOSIS — I252 Old myocardial infarction: Secondary | ICD-10-CM | POA: Diagnosis not present

## 2016-11-12 DIAGNOSIS — D72829 Elevated white blood cell count, unspecified: Secondary | ICD-10-CM | POA: Diagnosis not present

## 2016-11-12 DIAGNOSIS — I619 Nontraumatic intracerebral hemorrhage, unspecified: Secondary | ICD-10-CM | POA: Diagnosis not present

## 2016-11-12 DIAGNOSIS — I69392 Facial weakness following cerebral infarction: Secondary | ICD-10-CM | POA: Diagnosis not present

## 2016-11-12 DIAGNOSIS — T402X5A Adverse effect of other opioids, initial encounter: Secondary | ICD-10-CM | POA: Diagnosis not present

## 2016-11-12 DIAGNOSIS — R4182 Altered mental status, unspecified: Secondary | ICD-10-CM | POA: Diagnosis not present

## 2016-11-12 DIAGNOSIS — I517 Cardiomegaly: Secondary | ICD-10-CM | POA: Diagnosis not present

## 2016-11-12 DIAGNOSIS — R4781 Slurred speech: Secondary | ICD-10-CM | POA: Diagnosis not present

## 2016-11-12 DIAGNOSIS — G35 Multiple sclerosis: Secondary | ICD-10-CM | POA: Diagnosis present

## 2016-11-12 DIAGNOSIS — Z96642 Presence of left artificial hip joint: Secondary | ICD-10-CM | POA: Diagnosis present

## 2016-11-12 DIAGNOSIS — I69351 Hemiplegia and hemiparesis following cerebral infarction affecting right dominant side: Secondary | ICD-10-CM | POA: Diagnosis not present

## 2016-11-12 DIAGNOSIS — I63232 Cerebral infarction due to unspecified occlusion or stenosis of left carotid arteries: Secondary | ICD-10-CM | POA: Diagnosis not present

## 2016-11-12 DIAGNOSIS — I6350 Cerebral infarction due to unspecified occlusion or stenosis of unspecified cerebral artery: Secondary | ICD-10-CM | POA: Diagnosis not present

## 2016-11-12 DIAGNOSIS — R2990 Unspecified symptoms and signs involving the nervous system: Secondary | ICD-10-CM | POA: Diagnosis not present

## 2016-11-12 DIAGNOSIS — I10 Essential (primary) hypertension: Secondary | ICD-10-CM | POA: Diagnosis present

## 2016-11-12 DIAGNOSIS — I6501 Occlusion and stenosis of right vertebral artery: Secondary | ICD-10-CM | POA: Diagnosis not present

## 2016-11-12 DIAGNOSIS — R471 Dysarthria and anarthria: Secondary | ICD-10-CM | POA: Diagnosis not present

## 2016-11-12 DIAGNOSIS — E785 Hyperlipidemia, unspecified: Secondary | ICD-10-CM | POA: Diagnosis present

## 2016-11-12 DIAGNOSIS — I161 Hypertensive emergency: Secondary | ICD-10-CM | POA: Diagnosis not present

## 2016-11-12 DIAGNOSIS — R29705 NIHSS score 5: Secondary | ICD-10-CM | POA: Diagnosis present

## 2016-11-12 DIAGNOSIS — I6782 Cerebral ischemia: Secondary | ICD-10-CM | POA: Diagnosis not present

## 2016-11-12 DIAGNOSIS — H469 Unspecified optic neuritis: Secondary | ICD-10-CM | POA: Diagnosis present

## 2016-11-13 DIAGNOSIS — G35 Multiple sclerosis: Secondary | ICD-10-CM | POA: Insufficient documentation

## 2016-11-13 DIAGNOSIS — E785 Hyperlipidemia, unspecified: Secondary | ICD-10-CM | POA: Insufficient documentation

## 2016-11-13 DIAGNOSIS — I6522 Occlusion and stenosis of left carotid artery: Secondary | ICD-10-CM | POA: Insufficient documentation

## 2016-11-13 DIAGNOSIS — I10 Essential (primary) hypertension: Secondary | ICD-10-CM | POA: Insufficient documentation

## 2016-11-13 DIAGNOSIS — R4781 Slurred speech: Secondary | ICD-10-CM | POA: Insufficient documentation

## 2016-11-13 DIAGNOSIS — Z8673 Personal history of transient ischemic attack (TIA), and cerebral infarction without residual deficits: Secondary | ICD-10-CM | POA: Insufficient documentation

## 2016-11-16 DIAGNOSIS — I6521 Occlusion and stenosis of right carotid artery: Secondary | ICD-10-CM | POA: Insufficient documentation

## 2016-11-16 DIAGNOSIS — E7841 Elevated Lipoprotein(a): Secondary | ICD-10-CM | POA: Insufficient documentation

## 2016-11-16 DIAGNOSIS — G451 Carotid artery syndrome (hemispheric): Secondary | ICD-10-CM | POA: Insufficient documentation

## 2016-11-24 DIAGNOSIS — Z967 Presence of other bone and tendon implants: Secondary | ICD-10-CM | POA: Diagnosis not present

## 2016-11-24 DIAGNOSIS — R531 Weakness: Secondary | ICD-10-CM | POA: Diagnosis not present

## 2016-11-24 DIAGNOSIS — I6529 Occlusion and stenosis of unspecified carotid artery: Secondary | ICD-10-CM | POA: Diagnosis not present

## 2016-11-24 DIAGNOSIS — G8929 Other chronic pain: Secondary | ICD-10-CM | POA: Diagnosis not present

## 2016-11-24 DIAGNOSIS — I2510 Atherosclerotic heart disease of native coronary artery without angina pectoris: Secondary | ICD-10-CM | POA: Diagnosis not present

## 2016-11-24 DIAGNOSIS — I63512 Cerebral infarction due to unspecified occlusion or stenosis of left middle cerebral artery: Secondary | ICD-10-CM | POA: Diagnosis not present

## 2016-11-24 DIAGNOSIS — S82891D Other fracture of right lower leg, subsequent encounter for closed fracture with routine healing: Secondary | ICD-10-CM | POA: Diagnosis not present

## 2016-11-24 DIAGNOSIS — I6350 Cerebral infarction due to unspecified occlusion or stenosis of unspecified cerebral artery: Secondary | ICD-10-CM | POA: Diagnosis not present

## 2016-11-24 DIAGNOSIS — I69351 Hemiplegia and hemiparesis following cerebral infarction affecting right dominant side: Secondary | ICD-10-CM | POA: Diagnosis not present

## 2016-11-24 DIAGNOSIS — G35 Multiple sclerosis: Secondary | ICD-10-CM | POA: Diagnosis not present

## 2016-11-24 DIAGNOSIS — I6523 Occlusion and stenosis of bilateral carotid arteries: Secondary | ICD-10-CM | POA: Diagnosis not present

## 2016-11-24 DIAGNOSIS — I69322 Dysarthria following cerebral infarction: Secondary | ICD-10-CM | POA: Diagnosis not present

## 2016-11-24 DIAGNOSIS — G451 Carotid artery syndrome (hemispheric): Secondary | ICD-10-CM | POA: Diagnosis not present

## 2016-11-24 DIAGNOSIS — Z8781 Personal history of (healed) traumatic fracture: Secondary | ICD-10-CM | POA: Diagnosis not present

## 2016-11-24 DIAGNOSIS — I10 Essential (primary) hypertension: Secondary | ICD-10-CM | POA: Diagnosis not present

## 2016-11-24 DIAGNOSIS — M6250 Muscle wasting and atrophy, not elsewhere classified, unspecified site: Secondary | ICD-10-CM | POA: Diagnosis not present

## 2016-12-01 DIAGNOSIS — I6523 Occlusion and stenosis of bilateral carotid arteries: Secondary | ICD-10-CM | POA: Diagnosis not present

## 2016-12-01 DIAGNOSIS — G35 Multiple sclerosis: Secondary | ICD-10-CM | POA: Diagnosis not present

## 2016-12-01 DIAGNOSIS — G8929 Other chronic pain: Secondary | ICD-10-CM | POA: Diagnosis not present

## 2016-12-03 DIAGNOSIS — R531 Weakness: Secondary | ICD-10-CM | POA: Diagnosis not present

## 2016-12-03 DIAGNOSIS — I10 Essential (primary) hypertension: Secondary | ICD-10-CM | POA: Diagnosis not present

## 2016-12-03 DIAGNOSIS — I2510 Atherosclerotic heart disease of native coronary artery without angina pectoris: Secondary | ICD-10-CM | POA: Diagnosis not present

## 2016-12-03 DIAGNOSIS — I69322 Dysarthria following cerebral infarction: Secondary | ICD-10-CM | POA: Diagnosis not present

## 2016-12-07 DIAGNOSIS — Z967 Presence of other bone and tendon implants: Secondary | ICD-10-CM | POA: Diagnosis not present

## 2016-12-07 DIAGNOSIS — Z8781 Personal history of (healed) traumatic fracture: Secondary | ICD-10-CM | POA: Diagnosis not present

## 2016-12-10 DIAGNOSIS — I63512 Cerebral infarction due to unspecified occlusion or stenosis of left middle cerebral artery: Secondary | ICD-10-CM | POA: Diagnosis not present

## 2016-12-10 DIAGNOSIS — S82891D Other fracture of right lower leg, subsequent encounter for closed fracture with routine healing: Secondary | ICD-10-CM | POA: Diagnosis not present

## 2016-12-10 DIAGNOSIS — I69322 Dysarthria following cerebral infarction: Secondary | ICD-10-CM | POA: Diagnosis not present

## 2016-12-10 DIAGNOSIS — R531 Weakness: Secondary | ICD-10-CM | POA: Diagnosis not present

## 2016-12-12 DIAGNOSIS — G35 Multiple sclerosis: Secondary | ICD-10-CM | POA: Diagnosis not present

## 2016-12-12 DIAGNOSIS — I2510 Atherosclerotic heart disease of native coronary artery without angina pectoris: Secondary | ICD-10-CM | POA: Diagnosis not present

## 2016-12-12 DIAGNOSIS — S8254XD Nondisplaced fracture of medial malleolus of right tibia, subsequent encounter for closed fracture with routine healing: Secondary | ICD-10-CM | POA: Diagnosis not present

## 2016-12-12 DIAGNOSIS — I69322 Dysarthria following cerebral infarction: Secondary | ICD-10-CM | POA: Diagnosis not present

## 2016-12-12 DIAGNOSIS — S82454D Nondisplaced comminuted fracture of shaft of right fibula, subsequent encounter for closed fracture with routine healing: Secondary | ICD-10-CM | POA: Diagnosis not present

## 2016-12-12 DIAGNOSIS — I69320 Aphasia following cerebral infarction: Secondary | ICD-10-CM | POA: Diagnosis not present

## 2016-12-14 DIAGNOSIS — S82454D Nondisplaced comminuted fracture of shaft of right fibula, subsequent encounter for closed fracture with routine healing: Secondary | ICD-10-CM | POA: Diagnosis not present

## 2016-12-14 DIAGNOSIS — I69320 Aphasia following cerebral infarction: Secondary | ICD-10-CM | POA: Diagnosis not present

## 2016-12-14 DIAGNOSIS — G35 Multiple sclerosis: Secondary | ICD-10-CM | POA: Diagnosis not present

## 2016-12-14 DIAGNOSIS — S8254XD Nondisplaced fracture of medial malleolus of right tibia, subsequent encounter for closed fracture with routine healing: Secondary | ICD-10-CM | POA: Diagnosis not present

## 2016-12-14 DIAGNOSIS — I69322 Dysarthria following cerebral infarction: Secondary | ICD-10-CM | POA: Diagnosis not present

## 2016-12-14 DIAGNOSIS — I2510 Atherosclerotic heart disease of native coronary artery without angina pectoris: Secondary | ICD-10-CM | POA: Diagnosis not present

## 2016-12-15 DIAGNOSIS — G35 Multiple sclerosis: Secondary | ICD-10-CM | POA: Diagnosis not present

## 2016-12-15 DIAGNOSIS — S82454D Nondisplaced comminuted fracture of shaft of right fibula, subsequent encounter for closed fracture with routine healing: Secondary | ICD-10-CM | POA: Diagnosis not present

## 2016-12-15 DIAGNOSIS — I69320 Aphasia following cerebral infarction: Secondary | ICD-10-CM | POA: Diagnosis not present

## 2016-12-15 DIAGNOSIS — I69322 Dysarthria following cerebral infarction: Secondary | ICD-10-CM | POA: Diagnosis not present

## 2016-12-15 DIAGNOSIS — S8254XD Nondisplaced fracture of medial malleolus of right tibia, subsequent encounter for closed fracture with routine healing: Secondary | ICD-10-CM | POA: Diagnosis not present

## 2016-12-15 DIAGNOSIS — I2510 Atherosclerotic heart disease of native coronary artery without angina pectoris: Secondary | ICD-10-CM | POA: Diagnosis not present

## 2016-12-16 DIAGNOSIS — I2510 Atherosclerotic heart disease of native coronary artery without angina pectoris: Secondary | ICD-10-CM | POA: Diagnosis not present

## 2016-12-16 DIAGNOSIS — G35 Multiple sclerosis: Secondary | ICD-10-CM | POA: Diagnosis not present

## 2016-12-16 DIAGNOSIS — S8254XD Nondisplaced fracture of medial malleolus of right tibia, subsequent encounter for closed fracture with routine healing: Secondary | ICD-10-CM | POA: Diagnosis not present

## 2016-12-16 DIAGNOSIS — S82454D Nondisplaced comminuted fracture of shaft of right fibula, subsequent encounter for closed fracture with routine healing: Secondary | ICD-10-CM | POA: Diagnosis not present

## 2016-12-16 DIAGNOSIS — I69320 Aphasia following cerebral infarction: Secondary | ICD-10-CM | POA: Diagnosis not present

## 2016-12-16 DIAGNOSIS — I69322 Dysarthria following cerebral infarction: Secondary | ICD-10-CM | POA: Diagnosis not present

## 2016-12-18 DIAGNOSIS — I69322 Dysarthria following cerebral infarction: Secondary | ICD-10-CM | POA: Diagnosis not present

## 2016-12-18 DIAGNOSIS — S82454D Nondisplaced comminuted fracture of shaft of right fibula, subsequent encounter for closed fracture with routine healing: Secondary | ICD-10-CM | POA: Diagnosis not present

## 2016-12-18 DIAGNOSIS — I2510 Atherosclerotic heart disease of native coronary artery without angina pectoris: Secondary | ICD-10-CM | POA: Diagnosis not present

## 2016-12-18 DIAGNOSIS — I69320 Aphasia following cerebral infarction: Secondary | ICD-10-CM | POA: Diagnosis not present

## 2016-12-18 DIAGNOSIS — S8254XD Nondisplaced fracture of medial malleolus of right tibia, subsequent encounter for closed fracture with routine healing: Secondary | ICD-10-CM | POA: Diagnosis not present

## 2016-12-18 DIAGNOSIS — G35 Multiple sclerosis: Secondary | ICD-10-CM | POA: Diagnosis not present

## 2016-12-21 DIAGNOSIS — S8254XD Nondisplaced fracture of medial malleolus of right tibia, subsequent encounter for closed fracture with routine healing: Secondary | ICD-10-CM | POA: Diagnosis not present

## 2016-12-21 DIAGNOSIS — I69322 Dysarthria following cerebral infarction: Secondary | ICD-10-CM | POA: Diagnosis not present

## 2016-12-21 DIAGNOSIS — I69320 Aphasia following cerebral infarction: Secondary | ICD-10-CM | POA: Diagnosis not present

## 2016-12-21 DIAGNOSIS — S82454D Nondisplaced comminuted fracture of shaft of right fibula, subsequent encounter for closed fracture with routine healing: Secondary | ICD-10-CM | POA: Diagnosis not present

## 2016-12-21 DIAGNOSIS — I2510 Atherosclerotic heart disease of native coronary artery without angina pectoris: Secondary | ICD-10-CM | POA: Diagnosis not present

## 2016-12-21 DIAGNOSIS — G35 Multiple sclerosis: Secondary | ICD-10-CM | POA: Diagnosis not present

## 2016-12-22 DIAGNOSIS — I69320 Aphasia following cerebral infarction: Secondary | ICD-10-CM | POA: Diagnosis not present

## 2016-12-22 DIAGNOSIS — I2510 Atherosclerotic heart disease of native coronary artery without angina pectoris: Secondary | ICD-10-CM | POA: Diagnosis not present

## 2016-12-22 DIAGNOSIS — G35 Multiple sclerosis: Secondary | ICD-10-CM | POA: Diagnosis not present

## 2016-12-22 DIAGNOSIS — I69322 Dysarthria following cerebral infarction: Secondary | ICD-10-CM | POA: Diagnosis not present

## 2016-12-22 DIAGNOSIS — S8254XD Nondisplaced fracture of medial malleolus of right tibia, subsequent encounter for closed fracture with routine healing: Secondary | ICD-10-CM | POA: Diagnosis not present

## 2016-12-22 DIAGNOSIS — S82454D Nondisplaced comminuted fracture of shaft of right fibula, subsequent encounter for closed fracture with routine healing: Secondary | ICD-10-CM | POA: Diagnosis not present

## 2016-12-23 DIAGNOSIS — I69320 Aphasia following cerebral infarction: Secondary | ICD-10-CM | POA: Diagnosis not present

## 2016-12-23 DIAGNOSIS — G35 Multiple sclerosis: Secondary | ICD-10-CM | POA: Diagnosis not present

## 2016-12-23 DIAGNOSIS — S82454D Nondisplaced comminuted fracture of shaft of right fibula, subsequent encounter for closed fracture with routine healing: Secondary | ICD-10-CM | POA: Diagnosis not present

## 2016-12-23 DIAGNOSIS — I2510 Atherosclerotic heart disease of native coronary artery without angina pectoris: Secondary | ICD-10-CM | POA: Diagnosis not present

## 2016-12-23 DIAGNOSIS — I69322 Dysarthria following cerebral infarction: Secondary | ICD-10-CM | POA: Diagnosis not present

## 2016-12-23 DIAGNOSIS — S8254XD Nondisplaced fracture of medial malleolus of right tibia, subsequent encounter for closed fracture with routine healing: Secondary | ICD-10-CM | POA: Diagnosis not present

## 2016-12-25 DIAGNOSIS — G35 Multiple sclerosis: Secondary | ICD-10-CM | POA: Diagnosis not present

## 2016-12-25 DIAGNOSIS — I69320 Aphasia following cerebral infarction: Secondary | ICD-10-CM | POA: Diagnosis not present

## 2016-12-25 DIAGNOSIS — I69322 Dysarthria following cerebral infarction: Secondary | ICD-10-CM | POA: Diagnosis not present

## 2016-12-25 DIAGNOSIS — S8254XD Nondisplaced fracture of medial malleolus of right tibia, subsequent encounter for closed fracture with routine healing: Secondary | ICD-10-CM | POA: Diagnosis not present

## 2016-12-25 DIAGNOSIS — I2510 Atherosclerotic heart disease of native coronary artery without angina pectoris: Secondary | ICD-10-CM | POA: Diagnosis not present

## 2016-12-25 DIAGNOSIS — S82454D Nondisplaced comminuted fracture of shaft of right fibula, subsequent encounter for closed fracture with routine healing: Secondary | ICD-10-CM | POA: Diagnosis not present

## 2016-12-28 DIAGNOSIS — I69320 Aphasia following cerebral infarction: Secondary | ICD-10-CM | POA: Diagnosis not present

## 2016-12-28 DIAGNOSIS — I2510 Atherosclerotic heart disease of native coronary artery without angina pectoris: Secondary | ICD-10-CM | POA: Diagnosis not present

## 2016-12-28 DIAGNOSIS — S8254XD Nondisplaced fracture of medial malleolus of right tibia, subsequent encounter for closed fracture with routine healing: Secondary | ICD-10-CM | POA: Diagnosis not present

## 2016-12-28 DIAGNOSIS — G35 Multiple sclerosis: Secondary | ICD-10-CM | POA: Diagnosis not present

## 2016-12-28 DIAGNOSIS — S82454D Nondisplaced comminuted fracture of shaft of right fibula, subsequent encounter for closed fracture with routine healing: Secondary | ICD-10-CM | POA: Diagnosis not present

## 2016-12-28 DIAGNOSIS — I69322 Dysarthria following cerebral infarction: Secondary | ICD-10-CM | POA: Diagnosis not present

## 2016-12-30 DIAGNOSIS — S82454D Nondisplaced comminuted fracture of shaft of right fibula, subsequent encounter for closed fracture with routine healing: Secondary | ICD-10-CM | POA: Diagnosis not present

## 2016-12-30 DIAGNOSIS — I69320 Aphasia following cerebral infarction: Secondary | ICD-10-CM | POA: Diagnosis not present

## 2016-12-30 DIAGNOSIS — G35 Multiple sclerosis: Secondary | ICD-10-CM | POA: Diagnosis not present

## 2016-12-30 DIAGNOSIS — I69322 Dysarthria following cerebral infarction: Secondary | ICD-10-CM | POA: Diagnosis not present

## 2016-12-30 DIAGNOSIS — S8254XD Nondisplaced fracture of medial malleolus of right tibia, subsequent encounter for closed fracture with routine healing: Secondary | ICD-10-CM | POA: Diagnosis not present

## 2016-12-30 DIAGNOSIS — F329 Major depressive disorder, single episode, unspecified: Secondary | ICD-10-CM | POA: Diagnosis not present

## 2016-12-30 DIAGNOSIS — Z9181 History of falling: Secondary | ICD-10-CM | POA: Diagnosis not present

## 2016-12-30 DIAGNOSIS — M109 Gout, unspecified: Secondary | ICD-10-CM | POA: Diagnosis not present

## 2016-12-30 DIAGNOSIS — E785 Hyperlipidemia, unspecified: Secondary | ICD-10-CM | POA: Diagnosis not present

## 2016-12-30 DIAGNOSIS — I2510 Atherosclerotic heart disease of native coronary artery without angina pectoris: Secondary | ICD-10-CM | POA: Diagnosis not present

## 2016-12-30 DIAGNOSIS — I10 Essential (primary) hypertension: Secondary | ICD-10-CM | POA: Diagnosis not present

## 2016-12-31 DIAGNOSIS — I6529 Occlusion and stenosis of unspecified carotid artery: Secondary | ICD-10-CM | POA: Diagnosis not present

## 2017-01-01 DIAGNOSIS — G35 Multiple sclerosis: Secondary | ICD-10-CM | POA: Diagnosis not present

## 2017-01-01 DIAGNOSIS — I69320 Aphasia following cerebral infarction: Secondary | ICD-10-CM | POA: Diagnosis not present

## 2017-01-01 DIAGNOSIS — S82454D Nondisplaced comminuted fracture of shaft of right fibula, subsequent encounter for closed fracture with routine healing: Secondary | ICD-10-CM | POA: Diagnosis not present

## 2017-01-01 DIAGNOSIS — I69322 Dysarthria following cerebral infarction: Secondary | ICD-10-CM | POA: Diagnosis not present

## 2017-01-01 DIAGNOSIS — S8254XD Nondisplaced fracture of medial malleolus of right tibia, subsequent encounter for closed fracture with routine healing: Secondary | ICD-10-CM | POA: Diagnosis not present

## 2017-01-01 DIAGNOSIS — I2510 Atherosclerotic heart disease of native coronary artery without angina pectoris: Secondary | ICD-10-CM | POA: Diagnosis not present

## 2017-01-05 DIAGNOSIS — I6529 Occlusion and stenosis of unspecified carotid artery: Secondary | ICD-10-CM | POA: Insufficient documentation

## 2017-01-05 DIAGNOSIS — I2510 Atherosclerotic heart disease of native coronary artery without angina pectoris: Secondary | ICD-10-CM | POA: Diagnosis not present

## 2017-01-05 DIAGNOSIS — G35 Multiple sclerosis: Secondary | ICD-10-CM | POA: Diagnosis not present

## 2017-01-05 DIAGNOSIS — S82454D Nondisplaced comminuted fracture of shaft of right fibula, subsequent encounter for closed fracture with routine healing: Secondary | ICD-10-CM | POA: Diagnosis not present

## 2017-01-05 DIAGNOSIS — I69320 Aphasia following cerebral infarction: Secondary | ICD-10-CM | POA: Diagnosis not present

## 2017-01-05 DIAGNOSIS — S8254XD Nondisplaced fracture of medial malleolus of right tibia, subsequent encounter for closed fracture with routine healing: Secondary | ICD-10-CM | POA: Diagnosis not present

## 2017-01-05 DIAGNOSIS — I69322 Dysarthria following cerebral infarction: Secondary | ICD-10-CM | POA: Diagnosis not present

## 2017-01-06 DIAGNOSIS — S8254XD Nondisplaced fracture of medial malleolus of right tibia, subsequent encounter for closed fracture with routine healing: Secondary | ICD-10-CM | POA: Diagnosis not present

## 2017-01-06 DIAGNOSIS — S82454D Nondisplaced comminuted fracture of shaft of right fibula, subsequent encounter for closed fracture with routine healing: Secondary | ICD-10-CM | POA: Diagnosis not present

## 2017-01-06 DIAGNOSIS — I2510 Atherosclerotic heart disease of native coronary artery without angina pectoris: Secondary | ICD-10-CM | POA: Diagnosis not present

## 2017-01-06 DIAGNOSIS — I69322 Dysarthria following cerebral infarction: Secondary | ICD-10-CM | POA: Diagnosis not present

## 2017-01-06 DIAGNOSIS — I69320 Aphasia following cerebral infarction: Secondary | ICD-10-CM | POA: Diagnosis not present

## 2017-01-06 DIAGNOSIS — G35 Multiple sclerosis: Secondary | ICD-10-CM | POA: Diagnosis not present

## 2017-01-08 DIAGNOSIS — I69322 Dysarthria following cerebral infarction: Secondary | ICD-10-CM | POA: Diagnosis not present

## 2017-01-08 DIAGNOSIS — I69320 Aphasia following cerebral infarction: Secondary | ICD-10-CM | POA: Diagnosis not present

## 2017-01-08 DIAGNOSIS — G35 Multiple sclerosis: Secondary | ICD-10-CM | POA: Diagnosis not present

## 2017-01-08 DIAGNOSIS — I2510 Atherosclerotic heart disease of native coronary artery without angina pectoris: Secondary | ICD-10-CM | POA: Diagnosis not present

## 2017-01-08 DIAGNOSIS — S8254XD Nondisplaced fracture of medial malleolus of right tibia, subsequent encounter for closed fracture with routine healing: Secondary | ICD-10-CM | POA: Diagnosis not present

## 2017-01-08 DIAGNOSIS — S82454D Nondisplaced comminuted fracture of shaft of right fibula, subsequent encounter for closed fracture with routine healing: Secondary | ICD-10-CM | POA: Diagnosis not present

## 2017-01-12 DIAGNOSIS — S8254XD Nondisplaced fracture of medial malleolus of right tibia, subsequent encounter for closed fracture with routine healing: Secondary | ICD-10-CM | POA: Diagnosis not present

## 2017-01-12 DIAGNOSIS — I69322 Dysarthria following cerebral infarction: Secondary | ICD-10-CM | POA: Diagnosis not present

## 2017-01-12 DIAGNOSIS — G35 Multiple sclerosis: Secondary | ICD-10-CM | POA: Diagnosis not present

## 2017-01-12 DIAGNOSIS — S82454D Nondisplaced comminuted fracture of shaft of right fibula, subsequent encounter for closed fracture with routine healing: Secondary | ICD-10-CM | POA: Diagnosis not present

## 2017-01-12 DIAGNOSIS — I69320 Aphasia following cerebral infarction: Secondary | ICD-10-CM | POA: Diagnosis not present

## 2017-01-12 DIAGNOSIS — I2510 Atherosclerotic heart disease of native coronary artery without angina pectoris: Secondary | ICD-10-CM | POA: Diagnosis not present

## 2017-01-15 DIAGNOSIS — I69322 Dysarthria following cerebral infarction: Secondary | ICD-10-CM | POA: Diagnosis not present

## 2017-01-15 DIAGNOSIS — I2510 Atherosclerotic heart disease of native coronary artery without angina pectoris: Secondary | ICD-10-CM | POA: Diagnosis not present

## 2017-01-15 DIAGNOSIS — S8254XD Nondisplaced fracture of medial malleolus of right tibia, subsequent encounter for closed fracture with routine healing: Secondary | ICD-10-CM | POA: Diagnosis not present

## 2017-01-15 DIAGNOSIS — I69320 Aphasia following cerebral infarction: Secondary | ICD-10-CM | POA: Diagnosis not present

## 2017-01-15 DIAGNOSIS — S82454D Nondisplaced comminuted fracture of shaft of right fibula, subsequent encounter for closed fracture with routine healing: Secondary | ICD-10-CM | POA: Diagnosis not present

## 2017-01-15 DIAGNOSIS — G35 Multiple sclerosis: Secondary | ICD-10-CM | POA: Diagnosis not present

## 2017-01-21 DIAGNOSIS — I69320 Aphasia following cerebral infarction: Secondary | ICD-10-CM | POA: Diagnosis not present

## 2017-01-21 DIAGNOSIS — S82454D Nondisplaced comminuted fracture of shaft of right fibula, subsequent encounter for closed fracture with routine healing: Secondary | ICD-10-CM | POA: Diagnosis not present

## 2017-01-21 DIAGNOSIS — I69322 Dysarthria following cerebral infarction: Secondary | ICD-10-CM | POA: Diagnosis not present

## 2017-01-21 DIAGNOSIS — G35 Multiple sclerosis: Secondary | ICD-10-CM | POA: Diagnosis not present

## 2017-01-21 DIAGNOSIS — I2510 Atherosclerotic heart disease of native coronary artery without angina pectoris: Secondary | ICD-10-CM | POA: Diagnosis not present

## 2017-01-21 DIAGNOSIS — S8254XD Nondisplaced fracture of medial malleolus of right tibia, subsequent encounter for closed fracture with routine healing: Secondary | ICD-10-CM | POA: Diagnosis not present

## 2017-01-22 DIAGNOSIS — I2510 Atherosclerotic heart disease of native coronary artery without angina pectoris: Secondary | ICD-10-CM | POA: Diagnosis not present

## 2017-01-22 DIAGNOSIS — I69322 Dysarthria following cerebral infarction: Secondary | ICD-10-CM | POA: Diagnosis not present

## 2017-01-22 DIAGNOSIS — S82454D Nondisplaced comminuted fracture of shaft of right fibula, subsequent encounter for closed fracture with routine healing: Secondary | ICD-10-CM | POA: Diagnosis not present

## 2017-01-22 DIAGNOSIS — S8254XD Nondisplaced fracture of medial malleolus of right tibia, subsequent encounter for closed fracture with routine healing: Secondary | ICD-10-CM | POA: Diagnosis not present

## 2017-01-22 DIAGNOSIS — I69320 Aphasia following cerebral infarction: Secondary | ICD-10-CM | POA: Diagnosis not present

## 2017-01-22 DIAGNOSIS — G35 Multiple sclerosis: Secondary | ICD-10-CM | POA: Diagnosis not present

## 2017-01-25 DIAGNOSIS — S8254XD Nondisplaced fracture of medial malleolus of right tibia, subsequent encounter for closed fracture with routine healing: Secondary | ICD-10-CM | POA: Diagnosis not present

## 2017-01-25 DIAGNOSIS — G35 Multiple sclerosis: Secondary | ICD-10-CM | POA: Diagnosis not present

## 2017-01-25 DIAGNOSIS — S82454D Nondisplaced comminuted fracture of shaft of right fibula, subsequent encounter for closed fracture with routine healing: Secondary | ICD-10-CM | POA: Diagnosis not present

## 2017-01-25 DIAGNOSIS — I69322 Dysarthria following cerebral infarction: Secondary | ICD-10-CM | POA: Diagnosis not present

## 2017-01-25 DIAGNOSIS — I2510 Atherosclerotic heart disease of native coronary artery without angina pectoris: Secondary | ICD-10-CM | POA: Diagnosis not present

## 2017-01-25 DIAGNOSIS — I69320 Aphasia following cerebral infarction: Secondary | ICD-10-CM | POA: Diagnosis not present

## 2017-01-26 DIAGNOSIS — I69322 Dysarthria following cerebral infarction: Secondary | ICD-10-CM | POA: Diagnosis not present

## 2017-01-26 DIAGNOSIS — S8254XD Nondisplaced fracture of medial malleolus of right tibia, subsequent encounter for closed fracture with routine healing: Secondary | ICD-10-CM | POA: Diagnosis not present

## 2017-01-26 DIAGNOSIS — S82454D Nondisplaced comminuted fracture of shaft of right fibula, subsequent encounter for closed fracture with routine healing: Secondary | ICD-10-CM | POA: Diagnosis not present

## 2017-01-26 DIAGNOSIS — G35 Multiple sclerosis: Secondary | ICD-10-CM | POA: Diagnosis not present

## 2017-01-26 DIAGNOSIS — I2510 Atherosclerotic heart disease of native coronary artery without angina pectoris: Secondary | ICD-10-CM | POA: Diagnosis not present

## 2017-01-26 DIAGNOSIS — I69320 Aphasia following cerebral infarction: Secondary | ICD-10-CM | POA: Diagnosis not present

## 2017-01-27 DIAGNOSIS — S82454D Nondisplaced comminuted fracture of shaft of right fibula, subsequent encounter for closed fracture with routine healing: Secondary | ICD-10-CM | POA: Diagnosis not present

## 2017-01-27 DIAGNOSIS — S8254XD Nondisplaced fracture of medial malleolus of right tibia, subsequent encounter for closed fracture with routine healing: Secondary | ICD-10-CM | POA: Diagnosis not present

## 2017-01-27 DIAGNOSIS — I69320 Aphasia following cerebral infarction: Secondary | ICD-10-CM | POA: Diagnosis not present

## 2017-01-27 DIAGNOSIS — I69322 Dysarthria following cerebral infarction: Secondary | ICD-10-CM | POA: Diagnosis not present

## 2017-01-27 DIAGNOSIS — I2510 Atherosclerotic heart disease of native coronary artery without angina pectoris: Secondary | ICD-10-CM | POA: Diagnosis not present

## 2017-01-27 DIAGNOSIS — G35 Multiple sclerosis: Secondary | ICD-10-CM | POA: Diagnosis not present

## 2017-01-29 DIAGNOSIS — S82851D Displaced trimalleolar fracture of right lower leg, subsequent encounter for closed fracture with routine healing: Secondary | ICD-10-CM | POA: Diagnosis not present

## 2017-02-03 DIAGNOSIS — I6200 Nontraumatic subdural hemorrhage, unspecified: Secondary | ICD-10-CM | POA: Diagnosis not present

## 2017-02-03 DIAGNOSIS — R55 Syncope and collapse: Secondary | ICD-10-CM | POA: Diagnosis not present

## 2017-02-03 DIAGNOSIS — G9389 Other specified disorders of brain: Secondary | ICD-10-CM | POA: Diagnosis not present

## 2017-02-03 DIAGNOSIS — I252 Old myocardial infarction: Secondary | ICD-10-CM | POA: Diagnosis not present

## 2017-02-03 DIAGNOSIS — Z8673 Personal history of transient ischemic attack (TIA), and cerebral infarction without residual deficits: Secondary | ICD-10-CM | POA: Diagnosis not present

## 2017-02-03 DIAGNOSIS — Z7901 Long term (current) use of anticoagulants: Secondary | ICD-10-CM | POA: Diagnosis not present

## 2017-02-03 DIAGNOSIS — I6523 Occlusion and stenosis of bilateral carotid arteries: Secondary | ICD-10-CM | POA: Diagnosis not present

## 2017-02-03 DIAGNOSIS — R000 Tachycardia, unspecified: Secondary | ICD-10-CM | POA: Diagnosis not present

## 2017-02-03 DIAGNOSIS — I951 Orthostatic hypotension: Secondary | ICD-10-CM | POA: Diagnosis present

## 2017-02-03 DIAGNOSIS — B029 Zoster without complications: Secondary | ICD-10-CM | POA: Diagnosis present

## 2017-02-03 DIAGNOSIS — I10 Essential (primary) hypertension: Secondary | ICD-10-CM | POA: Diagnosis present

## 2017-02-03 DIAGNOSIS — G35 Multiple sclerosis: Secondary | ICD-10-CM | POA: Diagnosis present

## 2017-02-03 DIAGNOSIS — Z66 Do not resuscitate: Secondary | ICD-10-CM | POA: Diagnosis present

## 2017-02-03 DIAGNOSIS — R6889 Other general symptoms and signs: Secondary | ICD-10-CM | POA: Diagnosis not present

## 2017-02-03 DIAGNOSIS — F329 Major depressive disorder, single episode, unspecified: Secondary | ICD-10-CM | POA: Diagnosis present

## 2017-02-03 DIAGNOSIS — E785 Hyperlipidemia, unspecified: Secondary | ICD-10-CM | POA: Diagnosis present

## 2017-02-03 DIAGNOSIS — N179 Acute kidney failure, unspecified: Secondary | ICD-10-CM | POA: Diagnosis present

## 2017-02-05 DIAGNOSIS — B029 Zoster without complications: Secondary | ICD-10-CM | POA: Insufficient documentation

## 2017-02-10 DIAGNOSIS — I69322 Dysarthria following cerebral infarction: Secondary | ICD-10-CM | POA: Diagnosis not present

## 2017-02-10 DIAGNOSIS — I69320 Aphasia following cerebral infarction: Secondary | ICD-10-CM | POA: Diagnosis not present

## 2017-02-10 DIAGNOSIS — G35 Multiple sclerosis: Secondary | ICD-10-CM | POA: Diagnosis not present

## 2017-02-10 DIAGNOSIS — I2510 Atherosclerotic heart disease of native coronary artery without angina pectoris: Secondary | ICD-10-CM | POA: Diagnosis not present

## 2017-02-10 DIAGNOSIS — S8254XD Nondisplaced fracture of medial malleolus of right tibia, subsequent encounter for closed fracture with routine healing: Secondary | ICD-10-CM | POA: Diagnosis not present

## 2017-02-10 DIAGNOSIS — S82454D Nondisplaced comminuted fracture of shaft of right fibula, subsequent encounter for closed fracture with routine healing: Secondary | ICD-10-CM | POA: Diagnosis not present

## 2017-02-17 DIAGNOSIS — I2510 Atherosclerotic heart disease of native coronary artery without angina pectoris: Secondary | ICD-10-CM | POA: Diagnosis not present

## 2017-02-17 DIAGNOSIS — S8254XD Nondisplaced fracture of medial malleolus of right tibia, subsequent encounter for closed fracture with routine healing: Secondary | ICD-10-CM | POA: Diagnosis not present

## 2017-02-17 DIAGNOSIS — S82454D Nondisplaced comminuted fracture of shaft of right fibula, subsequent encounter for closed fracture with routine healing: Secondary | ICD-10-CM | POA: Diagnosis not present

## 2017-02-17 DIAGNOSIS — G35 Multiple sclerosis: Secondary | ICD-10-CM | POA: Diagnosis not present

## 2017-02-17 DIAGNOSIS — I69320 Aphasia following cerebral infarction: Secondary | ICD-10-CM | POA: Diagnosis not present

## 2017-02-17 DIAGNOSIS — I69322 Dysarthria following cerebral infarction: Secondary | ICD-10-CM | POA: Diagnosis not present

## 2017-02-18 DIAGNOSIS — G35 Multiple sclerosis: Secondary | ICD-10-CM | POA: Diagnosis not present

## 2017-02-18 DIAGNOSIS — S82454D Nondisplaced comminuted fracture of shaft of right fibula, subsequent encounter for closed fracture with routine healing: Secondary | ICD-10-CM | POA: Diagnosis not present

## 2017-02-18 DIAGNOSIS — I69320 Aphasia following cerebral infarction: Secondary | ICD-10-CM | POA: Diagnosis not present

## 2017-02-18 DIAGNOSIS — I2510 Atherosclerotic heart disease of native coronary artery without angina pectoris: Secondary | ICD-10-CM | POA: Diagnosis not present

## 2017-02-18 DIAGNOSIS — S8254XD Nondisplaced fracture of medial malleolus of right tibia, subsequent encounter for closed fracture with routine healing: Secondary | ICD-10-CM | POA: Diagnosis not present

## 2017-02-18 DIAGNOSIS — I69322 Dysarthria following cerebral infarction: Secondary | ICD-10-CM | POA: Diagnosis not present

## 2017-02-23 ENCOUNTER — Emergency Department: Payer: Medicare Other

## 2017-02-23 ENCOUNTER — Encounter: Payer: Self-pay | Admitting: Emergency Medicine

## 2017-02-23 ENCOUNTER — Inpatient Hospital Stay
Admission: EM | Admit: 2017-02-23 | Discharge: 2017-02-25 | DRG: 684 | Disposition: A | Discharge status: Home / Self Care | Payer: Medicare Other | Attending: Internal Medicine | Admitting: Internal Medicine

## 2017-02-23 DIAGNOSIS — E785 Hyperlipidemia, unspecified: Secondary | ICD-10-CM | POA: Diagnosis present

## 2017-02-23 DIAGNOSIS — N179 Acute kidney failure, unspecified: Secondary | ICD-10-CM | POA: Diagnosis present

## 2017-02-23 DIAGNOSIS — Z8673 Personal history of transient ischemic attack (TIA), and cerebral infarction without residual deficits: Secondary | ICD-10-CM

## 2017-02-23 DIAGNOSIS — Z7902 Long term (current) use of antithrombotics/antiplatelets: Secondary | ICD-10-CM | POA: Diagnosis not present

## 2017-02-23 DIAGNOSIS — Z79899 Other long term (current) drug therapy: Secondary | ICD-10-CM

## 2017-02-23 DIAGNOSIS — Z951 Presence of aortocoronary bypass graft: Secondary | ICD-10-CM

## 2017-02-23 DIAGNOSIS — Z96642 Presence of left artificial hip joint: Secondary | ICD-10-CM | POA: Diagnosis present

## 2017-02-23 DIAGNOSIS — N1330 Unspecified hydronephrosis: Secondary | ICD-10-CM

## 2017-02-23 DIAGNOSIS — R29898 Other symptoms and signs involving the musculoskeletal system: Secondary | ICD-10-CM

## 2017-02-23 DIAGNOSIS — E861 Hypovolemia: Secondary | ICD-10-CM | POA: Diagnosis present

## 2017-02-23 DIAGNOSIS — Z880 Allergy status to penicillin: Secondary | ICD-10-CM | POA: Diagnosis not present

## 2017-02-23 DIAGNOSIS — I252 Old myocardial infarction: Secondary | ICD-10-CM | POA: Diagnosis not present

## 2017-02-23 DIAGNOSIS — I10 Essential (primary) hypertension: Secondary | ICD-10-CM | POA: Diagnosis present

## 2017-02-23 DIAGNOSIS — R531 Weakness: Secondary | ICD-10-CM | POA: Diagnosis not present

## 2017-02-23 DIAGNOSIS — F17220 Nicotine dependence, chewing tobacco, uncomplicated: Secondary | ICD-10-CM | POA: Diagnosis present

## 2017-02-23 DIAGNOSIS — Z23 Encounter for immunization: Secondary | ICD-10-CM

## 2017-02-23 DIAGNOSIS — E039 Hypothyroidism, unspecified: Secondary | ICD-10-CM | POA: Diagnosis present

## 2017-02-23 DIAGNOSIS — G4730 Sleep apnea, unspecified: Secondary | ICD-10-CM | POA: Diagnosis present

## 2017-02-23 DIAGNOSIS — F329 Major depressive disorder, single episode, unspecified: Secondary | ICD-10-CM | POA: Diagnosis present

## 2017-02-23 DIAGNOSIS — M6281 Muscle weakness (generalized): Secondary | ICD-10-CM | POA: Diagnosis not present

## 2017-02-23 DIAGNOSIS — Z888 Allergy status to other drugs, medicaments and biological substances status: Secondary | ICD-10-CM

## 2017-02-23 DIAGNOSIS — Z7982 Long term (current) use of aspirin: Secondary | ICD-10-CM

## 2017-02-23 DIAGNOSIS — G35 Multiple sclerosis: Secondary | ICD-10-CM | POA: Diagnosis present

## 2017-02-23 DIAGNOSIS — R259 Unspecified abnormal involuntary movements: Secondary | ICD-10-CM | POA: Diagnosis not present

## 2017-02-23 DIAGNOSIS — R258 Other abnormal involuntary movements: Secondary | ICD-10-CM | POA: Diagnosis present

## 2017-02-23 DIAGNOSIS — W19XXXA Unspecified fall, initial encounter: Secondary | ICD-10-CM | POA: Diagnosis not present

## 2017-02-23 LAB — COMPREHENSIVE METABOLIC PANEL
ALK PHOS: 91 U/L (ref 38–126)
ALT: 24 U/L (ref 17–63)
AST: 27 U/L (ref 15–41)
Albumin: 4.5 g/dL (ref 3.5–5.0)
Anion gap: 14 (ref 5–15)
BILIRUBIN TOTAL: 0.6 mg/dL (ref 0.3–1.2)
BUN: 18 mg/dL (ref 6–20)
CALCIUM: 9.7 mg/dL (ref 8.9–10.3)
CO2: 22 mmol/L (ref 22–32)
CREATININE: 2.38 mg/dL — AB (ref 0.61–1.24)
Chloride: 103 mmol/L (ref 101–111)
GFR calc Af Amer: 34 mL/min — ABNORMAL LOW (ref >60)
GFR calc non Af Amer: 29 mL/min — ABNORMAL LOW (ref >60)
GLUCOSE: 129 mg/dL — AB (ref 65–99)
Potassium: 4 mmol/L (ref 3.5–5.1)
SODIUM: 139 mmol/L (ref 135–145)
TOTAL PROTEIN: 7.6 g/dL (ref 6.5–8.1)

## 2017-02-23 LAB — CBC WITH DIFFERENTIAL/PLATELET
Basophils Absolute: 0 10*3/uL (ref 0–0.1)
Basophils Relative: 1 %
EOS ABS: 0.1 10*3/uL (ref 0–0.7)
Eosinophils Relative: 1 %
HEMATOCRIT: 42.4 % (ref 40.0–52.0)
HEMOGLOBIN: 14.3 g/dL (ref 13.0–18.0)
LYMPHS ABS: 2 10*3/uL (ref 1.0–3.6)
Lymphocytes Relative: 24 %
MCH: 29.6 pg (ref 26.0–34.0)
MCHC: 33.8 g/dL (ref 32.0–36.0)
MCV: 87.7 fL (ref 80.0–100.0)
MONOS PCT: 11 %
Monocytes Absolute: 0.9 10*3/uL (ref 0.2–1.0)
NEUTROS ABS: 5.2 10*3/uL (ref 1.4–6.5)
NEUTROS PCT: 63 %
Platelets: 415 10*3/uL (ref 150–440)
RBC: 4.84 MIL/uL (ref 4.40–5.90)
RDW: 15.9 % — ABNORMAL HIGH (ref 11.5–14.5)
WBC: 8.2 10*3/uL (ref 3.8–10.6)

## 2017-02-23 MED ORDER — ONDANSETRON HCL 4 MG PO TABS: 4.0000 mg | ORAL_TABLET | Freq: Four times a day (QID) | ORAL | Status: DC | PRN

## 2017-02-23 MED ORDER — INFLUENZA VAC SPLIT QUAD 0.5 ML IM SUSY
0.5000 mL | PREFILLED_SYRINGE | INTRAMUSCULAR | Status: AC
  Administered 2017-02-24: 0.5 mL via INTRAMUSCULAR
  Filled 2017-02-23: qty 0.5

## 2017-02-23 MED ORDER — MONTELUKAST SODIUM 10 MG PO TABS
10.0000 mg | ORAL_TABLET | Freq: Every day | ORAL | Status: DC
  Administered 2017-02-24 – 2017-02-25 (×2): 10 mg via ORAL
  Filled 2017-02-23 (×2): qty 1

## 2017-02-23 MED ORDER — QUETIAPINE FUMARATE 25 MG PO TABS
100.0000 mg | ORAL_TABLET | Freq: Every day | ORAL | Status: DC
  Administered 2017-02-23 – 2017-02-24 (×2): 100 mg via ORAL
  Filled 2017-02-23 (×2): qty 4

## 2017-02-23 MED ORDER — NIACIN ER (ANTIHYPERLIPIDEMIC) 500 MG PO TBCR
500.0000 mg | EXTENDED_RELEASE_TABLET | Freq: Every day | ORAL | Status: DC
  Administered 2017-02-23 – 2017-02-24 (×2): 500 mg via ORAL
  Filled 2017-02-23 (×3): qty 1

## 2017-02-23 MED ORDER — LEVOTHYROXINE SODIUM 25 MCG PO TABS
25.0000 ug | ORAL_TABLET | Freq: Every day | ORAL | Status: DC
  Administered 2017-02-24 – 2017-02-25 (×2): 25 ug via ORAL
  Filled 2017-02-23 (×2): qty 1

## 2017-02-23 MED ORDER — ACETAMINOPHEN 325 MG PO TABS: 650.0000 mg | ORAL_TABLET | Freq: Four times a day (QID) | ORAL | Status: DC | PRN

## 2017-02-23 MED ORDER — TRAMADOL HCL 50 MG PO TABS: 50.0000 mg | ORAL_TABLET | Freq: Two times a day (BID) | ORAL | Status: DC | PRN

## 2017-02-23 MED ORDER — DIMETHYL FUMARATE 240 MG PO CPDR
240.0000 mg | DELAYED_RELEASE_CAPSULE | Freq: Two times a day (BID) | ORAL | Status: DC
  Administered 2017-02-24 – 2017-02-25 (×3): 240 mg via ORAL
  Filled 2017-02-23 (×4): qty 1

## 2017-02-23 MED ORDER — SODIUM CHLORIDE 0.9 % IV SOLN
INTRAVENOUS | Status: DC
  Administered 2017-02-23 – 2017-02-25 (×3): via INTRAVENOUS

## 2017-02-23 MED ORDER — BACLOFEN 10 MG PO TABS
10.0000 mg | ORAL_TABLET | Freq: Three times a day (TID) | ORAL | Status: DC
  Administered 2017-02-23 – 2017-02-25 (×6): 10 mg via ORAL
  Filled 2017-02-23 (×7): qty 1

## 2017-02-23 MED ORDER — VITAMIN B-12 1000 MCG PO TABS
1000.0000 ug | ORAL_TABLET | Freq: Every day | ORAL | Status: DC
  Administered 2017-02-24 – 2017-02-25 (×2): 1000 ug via ORAL
  Filled 2017-02-23 (×2): qty 1

## 2017-02-23 MED ORDER — SODIUM CHLORIDE 0.9 % IV BOLUS (SEPSIS)
1000.0000 mL | Freq: Once | INTRAVENOUS | Status: AC
  Administered 2017-02-23: 1000 mL via INTRAVENOUS

## 2017-02-23 MED ORDER — CALCIUM CARBONATE-VITAMIN D 500-200 MG-UNIT PO TABS
1.0000 | ORAL_TABLET | Freq: Every day | ORAL | Status: DC
  Administered 2017-02-24 – 2017-02-25 (×2): 1 via ORAL
  Filled 2017-02-23 (×2): qty 1

## 2017-02-23 MED ORDER — ESCITALOPRAM OXALATE 20 MG PO TABS
20.0000 mg | ORAL_TABLET | Freq: Every day | ORAL | Status: DC
  Administered 2017-02-24 – 2017-02-25 (×2): 20 mg via ORAL
  Filled 2017-02-23 (×2): qty 1

## 2017-02-23 MED ORDER — ATORVASTATIN CALCIUM 20 MG PO TABS
80.0000 mg | ORAL_TABLET | Freq: Every day | ORAL | Status: DC
  Administered 2017-02-23 – 2017-02-24 (×2): 80 mg via ORAL
  Filled 2017-02-23 (×2): qty 4

## 2017-02-23 MED ORDER — ASPIRIN EC 81 MG PO TBEC
81.0000 mg | DELAYED_RELEASE_TABLET | Freq: Every day | ORAL | Status: DC
  Administered 2017-02-24 – 2017-02-25 (×2): 81 mg via ORAL
  Filled 2017-02-23 (×2): qty 1

## 2017-02-23 MED ORDER — SENNOSIDES-DOCUSATE SODIUM 8.6-50 MG PO TABS
2.0000 | ORAL_TABLET | Freq: Two times a day (BID) | ORAL | Status: DC
  Administered 2017-02-23 – 2017-02-25 (×4): 2 via ORAL
  Filled 2017-02-23 (×4): qty 2

## 2017-02-23 MED ORDER — HYDRALAZINE HCL 20 MG/ML IJ SOLN: 10.0000 mg | Freq: Four times a day (QID) | INTRAMUSCULAR | Status: DC | PRN

## 2017-02-23 MED ORDER — ACETAMINOPHEN 650 MG RE SUPP: 650.0000 mg | Freq: Four times a day (QID) | RECTAL | Status: DC | PRN

## 2017-02-23 MED ORDER — FOLIC ACID 1 MG PO TABS
1.0000 mg | ORAL_TABLET | Freq: Every day | ORAL | Status: DC
  Administered 2017-02-24 – 2017-02-25 (×2): 1 mg via ORAL
  Filled 2017-02-23 (×2): qty 1

## 2017-02-23 MED ORDER — GABAPENTIN 300 MG PO CAPS
900.0000 mg | ORAL_CAPSULE | Freq: Three times a day (TID) | ORAL | Status: DC
  Administered 2017-02-23 – 2017-02-25 (×6): 900 mg via ORAL
  Filled 2017-02-23 (×6): qty 3

## 2017-02-23 MED ORDER — ONDANSETRON HCL 4 MG/2ML IJ SOLN: 4.0000 mg | Freq: Four times a day (QID) | INTRAMUSCULAR | Status: DC | PRN

## 2017-02-23 MED ORDER — OXYCODONE HCL 5 MG PO TABS
5.0000 mg | ORAL_TABLET | ORAL | Status: DC | PRN
  Administered 2017-02-25: 10 mg via ORAL
  Filled 2017-02-23: qty 2

## 2017-02-23 MED ORDER — BUPROPION HCL ER (XL) 300 MG PO TB24
300.0000 mg | ORAL_TABLET | Freq: Every day | ORAL | Status: DC
  Administered 2017-02-24 – 2017-02-25 (×2): 300 mg via ORAL
  Filled 2017-02-23 (×2): qty 1

## 2017-02-23 MED ORDER — AMLODIPINE BESYLATE 5 MG PO TABS
5.0000 mg | ORAL_TABLET | Freq: Every day | ORAL | Status: DC
  Administered 2017-02-24 – 2017-02-25 (×2): 5 mg via ORAL
  Filled 2017-02-23 (×2): qty 1

## 2017-02-23 MED ORDER — CLOPIDOGREL BISULFATE 75 MG PO TABS
75.0000 mg | ORAL_TABLET | Freq: Every day | ORAL | Status: DC
  Administered 2017-02-24 – 2017-02-25 (×2): 75 mg via ORAL
  Filled 2017-02-23 (×2): qty 1

## 2017-02-23 MED ORDER — HEPARIN SODIUM (PORCINE) 5000 UNIT/ML IJ SOLN
5000.0000 [IU] | Freq: Three times a day (TID) | INTRAMUSCULAR | Status: DC
  Administered 2017-02-23 – 2017-02-25 (×6): 5000 [IU] via SUBCUTANEOUS
  Filled 2017-02-23 (×6): qty 1

## 2017-02-23 MED ORDER — SENNOSIDES-DOCUSATE SODIUM 8.6-50 MG PO TABS
1.0000 | ORAL_TABLET | Freq: Every evening | ORAL | Status: DC | PRN
  Administered 2017-02-24: 22:00:00 1 via ORAL

## 2017-02-23 NOTE — H&P (Signed)
[facility] [place] [facility]   PATIENT NAME: Cody Burns    MR#:  [id number]  DATE OF BIRTH:  [birth year]  DATE OF ADMISSION:  02/23/2017  PRIMARY CARE PHYSICIAN: [facility]   REQUESTING/REFERRING PHYSICIAN: dr Lenard Lance  CHIEF COMPLAINT:   falls HISTORY OF PRESENT ILLNESS:  Cody Burns  is a 51 y.o. 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 Patient has had several episodes of right leg shaking. He is being evaluated by a neurologist at [facility]. He has had symptoms in the past and has had a thorough workup for this. Apparently at work patient had an episode of this shaking of his leg and was unable to walk says mother brought him to the ER for further evaluation. Patient denies back pain, saddle anesthesia, no bowel or bladder incontinence. No numbness or tingling of his lower extremities. On routine laboratory work it is noted the patient has acute kidney injury. Patient reports no dysuria, frequency or urgency. Patient does not take NSAIDs on a daily basis. He has having adequate urine output. He is hydrating himself well.  PAST MEDICAL HISTORY:   Past Medical History:  Diagnosis Date  . 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 cause of his ankle break. awaiting neuro workup  . Hypertension   . Hypothyroidism   . Multiple sclerosis (HCC) 1990  . Myocardial infarct (HCC)   . Optic neuritis    x 3. takes steriods and it resolves.  . Seizures (HCC)    as a child. diagnosed with MS and once treated, seizures resolved.  . Sleep apnea     PAST SURGICAL HISTORY:   Past Surgical History:  Procedure Laterality Date  . CORONARY ARTERY BYPASS GRAFT  2015  . JOINT REPLACEMENT Left 2007   left hip replacement  . ORIF ANKLE FRACTURE Right 09/22/2016   Procedure: OPEN  REDUCTION INTERNAL FIXATION (ORIF) ANKLE FRACTURE;  Surgeon: Christena Flake, MD;  Location: [facility] ORS;  Service: Orthopedics;  Laterality: Right;  . quadruple cardiac bypass  2015    SOCIAL HISTORY:   Social History  Substance Use Topics  . Smoking status: Never Smoker  . Smokeless tobacco: Current User     Comment: rarely uses smokeless tobacco  . Alcohol use Yes     Comment: maybe a 6 pack per week    FAMILY HISTORY:  Positive hypertension  DRUG ALLERGIES:   Allergies  Allergen Reactions  . Hydrochlorothiazide W-Triamterene Other (See Comments)    High blood pressure  . Metoprolol Other (See Comments)    Exacerbates MS, High blood pressure  . Penicillins Other (See Comments)    Sores in mouth Has patient had a PCN reaction causing immediate rash, facial/tongue/throat swelling, SOB or lightheadedness with hypotension: No Has patient had a PCN reaction causing severe rash involving mucus membranes or skin necrosis: No Has patient had a PCN reaction that required hospitalization: No Has patient had a PCN reaction occurring within the last 10 years: No If all of the above answers are "NO", then may proceed with Cephalosporin use.     REVIEW OF SYSTEMS:   Review of Systems  Constitutional: Negative.  Negative for chills, fever and malaise/fatigue.  HENT: Negative.  Negative for ear discharge, ear pain, hearing loss, nosebleeds and sore throat.   Eyes: Negative.  Negative for blurred vision and pain.  Respiratory: Negative.  Negative for cough, hemoptysis, shortness of breath and wheezing.   Cardiovascular: Negative.  Negative for chest pain, palpitations and leg swelling.  Gastrointestinal: Negative.  Negative for abdominal pain, blood in stool, diarrhea, nausea and vomiting.  Genitourinary: Negative.  Negative for dysuria.  Musculoskeletal: Positive for falls. Negative for back pain.  Skin: Negative.   Neurological: Negative for dizziness, tremors, speech change, focal  weakness, seizures and headaches.       Right leg shaking spells  Endo/Heme/Allergies: Negative.  Does not bruise/bleed easily.  Psychiatric/Behavioral: Negative.  Negative for depression, hallucinations and suicidal ideas.    MEDICATIONS AT HOME:   Prior to Admission medications   Medication Sig Start Date End Date Taking? Authorizing Provider  amLODipine (NORVASC) 10 MG tablet Take 5 mg by mouth daily.   Yes [provider]  aspirin EC 81 MG tablet Take 81 mg by mouth daily.   Yes [provider]  atorvastatin (LIPITOR) 80 MG tablet Take 80 mg by mouth at bedtime.   Yes [provider]  baclofen (LIORESAL) 10 MG tablet Take 10 mg by mouth 3 (three) times daily.   Yes [provider]  buPROPion (WELLBUTRIN XL) 300 MG 24 hr tablet Take 300 mg by mouth daily.   Yes [provider]  calcium-vitamin D (OSCAL WITH D) 500-200 MG-UNIT tablet Take 1 tablet by mouth daily.   Yes [provider]  clopidogrel (PLAVIX) 75 MG tablet Take 75 mg by mouth daily. 11/25/16 11/25/17 Yes [provider]  cyanocobalamin 1000 MCG tablet Take 1,000 mcg by mouth daily.   Yes [provider]  Dimethyl Fumarate (TECFIDERA) 240 MG CPDR Take 240 mg by mouth 2 (two) times daily.   Yes [provider]  escitalopram (LEXAPRO) 10 MG tablet Take 20 mg by mouth daily.   Yes [provider]  folic acid (FOLVITE) 1 MG tablet Take 1 mg by mouth daily. 11/25/16 11/25/17 Yes [provider]  gabapentin (NEURONTIN) 300 MG capsule Take 900 mg by mouth 3 (three) times daily.    Yes [provider]  levothyroxine (SYNTHROID, LEVOTHROID) 25 MCG tablet Take 25 mcg by mouth daily before breakfast.    Yes [provider]  lisinopril (PRINIVIL,ZESTRIL) 10 MG tablet Take 10 mg by mouth daily.   Yes [provider]  montelukast (SINGULAIR) 10 MG tablet Take 10 mg by mouth daily.    Yes [provider]  niacin  (NIASPAN) 500 MG CR tablet Take 500 mg by mouth at bedtime. 11/24/16 11/24/17 Yes [provider]  oxyCODONE (OXY IR/ROXICODONE) 5 MG immediate release tablet Take 1-2 tablets (5-10 mg total) by mouth every 4 (four) hours as needed for breakthrough pain. 09/23/16  Yes Anson [place], PA-C  QUEtiapine (SEROQUEL) 200 MG tablet Take 100 mg by mouth at bedtime.   Yes [provider]  sennosides-docusate sodium (SENOKOT-S) 8.6-50 MG tablet Take 2 tablets by mouth 2 (two) times daily.   Yes [provider]  traMADol (ULTRAM) 50 MG tablet Take 50 mg by mouth 2 (two) times daily as needed for moderate pain or severe pain.   Yes [provider]      VITAL SIGNS:  Blood pressure 129/73, pulse 98, temperature 97.6 F (36.4 C), temperature source Oral, resp. rate (!) 21, height 6' (1.829 m), weight 102.1 kg (225 lb), SpO2 94 %.  PHYSICAL EXAMINATION:   Physical Exam  Constitutional: He is oriented to  person, place, and time and well-developed, well-nourished, and in no distress. No distress.  HENT:  Head: Normocephalic.  Eyes: No scleral icterus.  Neck: Normal range of motion. Neck supple. No JVD present. No tracheal deviation present.  Cardiovascular: Normal rate, regular rhythm and normal heart sounds.  Exam reveals no gallop and no friction rub.   No murmur heard. Pulmonary/Chest: Effort normal and breath sounds normal. No respiratory distress. He has no wheezes. He has no rales. He exhibits no tenderness.  Abdominal: Soft. Bowel sounds are normal. He exhibits no distension and no mass. There is no tenderness. There is no rebound and no guarding.  Musculoskeletal: Normal range of motion. He exhibits no edema.  Neurological: He is alert and oriented to person, place, and time.  Skin: Skin is warm. No rash noted. No erythema.  Psychiatric: Affect and judgment normal.      LABORATORY PANEL:   CBC  Recent Labs Lab 02/23/17 1608  WBC 8.2  HGB 14.3  HCT  42.4  PLT 415   ------------------------------------------------------------------------------------------------------------------  Chemistries   Recent Labs Lab 02/23/17 1608  NA 139  K 4.0  CL 103  CO2 22  GLUCOSE 129*  BUN 18  CREATININE 2.38*  CALCIUM 9.7  AST 27  ALT 24  ALKPHOS 91  BILITOT 0.6   ------------------------------------------------------------------------------------------------------------------  Cardiac Enzymes No results for input(s): TROPONINI in the last 168 hours. ------------------------------------------------------------------------------------------------------------------  RADIOLOGY:  Ct Head Wo Contrast  Result Date: 02/23/2017 CLINICAL DATA:  Leg weakness. EXAM: CT HEAD WITHOUT CONTRAST TECHNIQUE: Contiguous axial images were obtained from the base of the skull through the vertex without intravenous contrast. COMPARISON:  None. FINDINGS: Brain: No evidence of acute infarction, hemorrhage, hydrocephalus, extra-axial collection or mass lesion/mass effect. Cerebral and cerebellar atrophy with compensatory dilatation of the ventricles. Mild periventricular white matter and corona radiata hypodensities favor chronic ischemic microvascular white matter disease. Vascular: Atherosclerotic vascular calcification of the carotid siphons. Rash that intracranial atherosclerotic vascular calcifications. No hyperdense vessel. Skull: Prior left temporal craniotomy.  No fracture or focal lesion. Sinuses/Orbits: The bilateral paranasal sinuses and mastoid air cells are clear. The orbits are unremarkable. Other: None. IMPRESSION: No acute intracranial abnormality. Cerebral and cerebellar atrophy, slightly advanced for age. Electronically Signed   By: Obie Dredge M.D.   On: 02/23/2017 16:27    EKG:  none  IMPRESSION AND PLAN:   51 year old male who has had episodes of right leg shaking and is being currently followed at [facility] neurology [facility] and has  an appointment in 2 days with the neurologist to presents after a shaking spell and incidentally found to have acute kidney injury.  1. Acute kidney injury Stop lisinopril Start IV fluids Check renal ultrasound Hold nephrotoxic medications Repeat BMP in a.m. and if not improving consider nephrology consultation  2. Falls and right leg shaking spells: Patient will follow up with his neurologist Physical therapy consultation requested. 3. Essential hypertension: Continue Norvasc and hold lisinopril due to acute kidney injury Follow blood pressure  4. Hypothyroid: Continue Synthroid  5. Hyperlipidemia: Continue atorvastatin and niacin  6. Depression: Continue Lexapro  7. History CVA: Continue Plavix and statin    All the records are reviewed and case discussed with ED provider. Management plans discussed with the patient and he is in agreement  CODE STATUS: full  TOTAL TIME TAKING CARE OF THIS PATIENT: 41 minutes.    Harrold Fitchett M.D on 02/23/2017 at 5:26 PM  Between 7am to 6pm - Pager - [phone]  After 6pm go to [email or web address] - Social research officer, government  [facility]  Office  [phone]  CC: Primary care physician; [facility]

## 2017-02-23 NOTE — ED Provider Notes (Signed)
[facility] Emergency Department Provider Note  ____________________________________________  Time seen: Approximately 4:38 PM  I have reviewed the triage vital signs and the nursing notes.   HISTORY  Chief Complaint Extremity Weakness   HPI Cody Burns is a 51 y.o. male with h/o MS, CVA s/p left extcranial-intracranial bypass June '18 c/b small left SDH, HTN and HLD who presents for evaluation of R leg shaking. Patient reports that he has been having these episodes since April 2018 almost daily. His R leg starts to shake uncontrollably and then it gives out making patient fall. Patient reports that sometimes he is unable to walk for several hours after that and needs to use a wheelchair. Patient has had several tests including CTA of the brain to evaluate patency of his bypass, EEG, and neurology evaluation at [facility] with unclear etiology of the symptoms. Has appointment with his Neurologist in 2 days. Today patient was at work when he had another episode and couldn't walk so his brother brought him here for evaluation. Patient has no back pain, no saddle anesthesia, no leg pain, no numbness or weakness of his leg, no headache, no dizziness, no syncope, no chest pain or shortness of breath, no hip pain.  Past Medical History:  Diagnosis Date  . 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 cause of his ankle break. awaiting neuro workup  . Hypertension   . Hypothyroidism   . Multiple sclerosis (HCC) 1990  . Myocardial infarct (HCC)   . Optic neuritis    x 3. takes steriods and it resolves.  . Seizures (HCC)    as a child. diagnosed with MS and once treated, seizures resolved.  . Sleep apnea     Patient Active Problem List   Diagnosis Date Noted  . Closed right ankle fracture 09/22/2016    Past Surgical History:  Procedure Laterality Date  . CORONARY ARTERY BYPASS GRAFT  2015  . JOINT REPLACEMENT Left 2007   left hip replacement  . ORIF  ANKLE FRACTURE Right 09/22/2016   Procedure: OPEN REDUCTION INTERNAL FIXATION (ORIF) ANKLE FRACTURE;  Surgeon: Christena Flake, MD;  Location: [facility] ORS;  Service: Orthopedics;  Laterality: Right;  . quadruple cardiac bypass  2015    Prior to Admission medications   Medication Sig Start Date End Date Taking? Authorizing Provider  amLODipine (NORVASC) 10 MG tablet Take 5 mg by mouth daily.    [provider]  aspirin EC 81 MG tablet Take 81 mg by mouth daily.    [provider]  atorvastatin (LIPITOR) 80 MG tablet Take 80 mg by mouth at bedtime.    [provider]  baclofen (LIORESAL) 10 MG tablet Take 10 mg by mouth 3 (three) times daily.    [provider]  buPROPion (WELLBUTRIN XL) 300 MG 24 hr tablet Take 300 mg by mouth daily.    [provider]  calcium-vitamin D (OSCAL WITH D) 500-200 MG-UNIT tablet Take 1 tablet by mouth daily.    [provider]  cyanocobalamin 1000 MCG tablet Take 1,000 mcg by mouth daily.    [provider]  Dimethyl Fumarate (TECFIDERA) 240 MG CPDR Take 240 mg by mouth 2 (two) times daily.    [provider]  escitalopram (LEXAPRO) 10 MG tablet Take 20 mg by mouth daily.    [provider]  gabapentin (NEURONTIN) 300 MG capsule Take 900 mg by mouth 4 (four) times daily.    [provider]  levothyroxine (  SYNTHROID, LEVOTHROID) 25 MCG tablet Take 75 mcg by mouth daily before breakfast.    [provider]  metoprolol (TOPROL-XL) 200 MG 24 hr tablet Take 100 mg by mouth daily.    [provider]  montelukast (SINGULAIR) 10 MG tablet Take 10 mg by mouth at bedtime.    [provider]  oxyCODONE (OXY IR/ROXICODONE) 5 MG immediate release tablet Take 1-2 tablets (5-10 mg total) by mouth every 4 (four) hours as needed for breakthrough pain. 09/23/16   Anson Oregon, PA-C  QUEtiapine (SEROQUEL) 200 MG tablet Take 100 mg by mouth at bedtime.    [provider]  ranitidine (ZANTAC) 150 MG tablet Take 150 mg by mouth at bedtime.    [provider]  sennosides-docusate sodium (SENOKOT-S) 8.6-50 MG tablet Take 2 tablets by mouth 2 (two) times daily.    [provider]  traMADol (ULTRAM) 50 MG tablet Take 50 mg by mouth 2 (two) times daily as needed for moderate pain or severe pain.    [provider]    Allergies Penicillins  History reviewed. No pertinent family history.  Social History Social History  Substance Use Topics  . Smoking status: Never Smoker  . Smokeless tobacco: Current User     Comment: rarely uses smokeless tobacco  . Alcohol use Yes     Comment: maybe a 6 pack per week    Review of Systems  Constitutional: Negative for fever. Eyes: Negative for visual changes. ENT: Negative for sore throat. Neck: No neck pain  Cardiovascular: Negative for chest pain. Respiratory: Negative for shortness of breath. Gastrointestinal: Negative for abdominal pain, vomiting or diarrhea. Genitourinary: Negative for dysuria. Musculoskeletal: Negative for back pain. + RLE shaking Skin: Negative for rash. Neurological: Negative for headaches, weakness or numbness. Psych: No SI or HI  ____________________________________________   PHYSICAL EXAM:  VITAL SIGNS: ED Triage Vitals [02/23/17 1554]  Enc Vitals Group     BP 121/64     Pulse Rate (!) 103     Resp 18     Temp 97.6 F (36.4 C)     Temp Source Oral     SpO2 97 %     Weight 225 lb (102.1 kg)     Height 6' (1.829 m)     Head Circumference      Peak Flow      Pain Score      Pain Loc      Pain Edu?      Excl. in GC?     Constitutional: Alert and oriented. Well appearing and in no apparent distress. HEENT:      Head: Normocephalic and atraumatic.         Eyes: Conjunctivae are normal. Sclera is non-icteric.       Mouth/Throat: Mucous membranes are moist.       Neck: Supple with no signs of meningismus. Cardiovascular: Regular  rate and rhythm. No murmurs, gallops, or rubs. 2+ symmetrical distal pulses are present in all extremities. No JVD. Respiratory: Normal respiratory effort. Lungs are clear to auscultation bilaterally. No wheezes, crackles, or rhonchi.  Gastrointestinal: Soft, non tender, and non distended with positive bowel sounds. No rebound or guarding. Musculoskeletal: Nontender with normal range of motion in all extremities. No edema, cyanosis, or erythema of extremities. No midline c/t/l spine ttp. RLE is warm and well perfused with normal pulses Neurologic: Normal speech and language. A & O x3, PERRL, CN II-XII intact, motor testing reveals good tone and bulk  throughout. There is no evidence of pronator drift or dysmetria. Muscle strength is 5/5 throughout. Deep tendon reflexes are 2+ throughout with downgoing toes bilaterally. Sensory examination is intact.  Skin: Skin is warm, dry and intact. No rash noted. Psychiatric: Mood and affect are normal. Speech and behavior are normal.  ____________________________________________   LABS (all labs ordered are listed, but only abnormal results are displayed)  Labs Reviewed  CBC WITH DIFFERENTIAL/PLATELET - Abnormal; Notable for the following:       Result Value   RDW 15.9 (*)    All other components within normal limits  COMPREHENSIVE METABOLIC PANEL - Abnormal; Notable for the following:    Glucose, Bld 129 (*)    Creatinine, Ser 2.38 (*)    GFR calc non Af Amer 29 (*)    GFR calc Af Amer 34 (*)    All other components within normal limits   ____________________________________________  EKG  none  ____________________________________________  RADIOLOGY  Head CT:   ____________________________________________   PROCEDURES  Procedure(s) performed: None Procedures Critical Care performed:  None ____________________________________________   INITIAL IMPRESSION / ASSESSMENT AND PLAN / ED COURSE   51 y.o. male with h/o MS, CVA s/p left  extcranial-intracranial bypass June '18 c/b small left SDH, HTN and HLD who presents for evaluation of R leg shaking since April 2018. Unclear etiology at this time. Review of EMR shows patient has undergone CTA to eval patency of brain bypass and EEG which showed no evidence of seizure. Thought to be due to dehydration at one point since one episode happened in the setting of dehydration. Exam is completely normal. patient is completely neurologically intact, normal strenght and sensation of the right lower extremity, leg is warm and well perfused with normal pulses and brisk capillary few. 2+ DTRs bilaterally. Ddx partial seizure may be not seen on prior EEG vs neuropathy vs myopathy. No sigs or symptoms of cauda equina, or GBS. Head CT negative for acute findings. Labs showing dehydration with creatinine of 2.38 (baseline 1.2), will give IVF and admit     Pertinent labs & imaging results that were available during my care of the patient were reviewed by me and considered in my medical decision making (see chart for details).    ____________________________________________   FINAL CLINICAL IMPRESSION(S) / ED DIAGNOSES  Final diagnoses:  AKI (acute kidney injury) (HCC)  Right leg weakness      NEW MEDICATIONS STARTED DURING THIS VISIT:  New Prescriptions   No medications on file     Note:  This document was prepared using Dragon voice recognition software and may include unintentional dictation errors.    Don Perking, [place], MD 02/23/17 [phone]

## 2017-02-23 NOTE — Plan of Care (Signed)
Problem: Education: Goal: Knowledge of [facility] Education information/materials will improve Outcome: Progressing Pt likes to be called Cody Burns  Past Medical History:  Diagnosis Date  . 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 cause of his ankle break. awaiting neuro workup  . Hypertension   . Hypothyroidism   . Multiple sclerosis (HCC) 1990  . Myocardial infarct (HCC)   . Optic neuritis    x 3. takes steriods and it resolves.  . Seizures (HCC)    as a child. diagnosed with MS and once treated, seizures resolved.  . Sleep apnea    Pt is well controlled with home medications

## 2017-02-23 NOTE — ED Triage Notes (Addendum)
Pt started with fx ankle beginning of June then ended up with brain surgery etc.  Every since then has had difficulty with legs giving out. Today right leg gave out while walking.  Reports doctors cannot tell him what is causing this.  No pain in legs.  No headache today but has had intermittent in past since surgery.

## 2017-02-24 ENCOUNTER — Inpatient Hospital Stay: Payer: Medicare Other

## 2017-02-24 LAB — BASIC METABOLIC PANEL
Anion gap: 6 (ref 5–15)
BUN: 16 mg/dL (ref 6–20)
CALCIUM: 8.9 mg/dL (ref 8.9–10.3)
CO2: 25 mmol/L (ref 22–32)
Chloride: 109 mmol/L (ref 101–111)
Creatinine, Ser: 1.57 mg/dL — ABNORMAL HIGH (ref 0.61–1.24)
GFR calc Af Amer: 56 mL/min — ABNORMAL LOW (ref >60)
GFR calc non Af Amer: 49 mL/min — ABNORMAL LOW (ref >60)
GLUCOSE: 108 mg/dL — AB (ref 65–99)
POTASSIUM: 3.5 mmol/L (ref 3.5–5.1)
Sodium: 140 mmol/L (ref 135–145)

## 2017-02-24 NOTE — Progress Notes (Signed)
[facility] - [place] at [facility]   PATIENT NAME: Cody Burns    MR#:  [id number]  DATE OF BIRTH:  07/18/1965  SUBJECTIVE: Admitted for sudden onset of jerking of both legs, unable to walk after jerking movements. According to patient's brother patient keeps having these symptoms since February and not able to find the reason for this checking moments of the [place] and certainly having no muscle strength in the legs and then he collapses. Found to have acute kidney injury, receiving IV fluids. Has history of multiple sclerosis, recently 2 weeks ago for which he  Received IV steroids.   CHIEF COMPLAINT:   Chief Complaint  Patient presents with  . Extremity Weakness    REVIEW OF SYSTEMS:    Review of Systems  Constitutional: Negative for chills and fever.  HENT: Negative for hearing loss.   Eyes: Negative for blurred vision, double vision and photophobia.  Respiratory: Negative for cough, hemoptysis and shortness of breath.   Cardiovascular: Negative for palpitations, orthopnea and leg swelling.  Gastrointestinal: Negative for abdominal pain, diarrhea and vomiting.  Genitourinary: Negative for dysuria and urgency.  Musculoskeletal: Negative for myalgias and neck pain.  Skin: Negative for rash.  Neurological: Negative for dizziness, focal weakness, seizures, weakness and headaches.  Psychiatric/Behavioral: Negative for memory loss. The patient does not have insomnia.     Nutrition:  Tolerating Diet: Tolerating PT:      DRUG ALLERGIES:   Allergies  Allergen Reactions  . Hydrochlorothiazide W-Triamterene Other (See Comments)    High blood pressure  . Metoprolol Other (See Comments)    Exacerbates MS, High blood pressure  . Penicillins Other (See Comments)    Sores in mouth Has patient had a PCN reaction causing immediate rash, facial/tongue/throat swelling, SOB or lightheadedness with hypotension: No Has patient had a PCN reaction causing  severe rash involving mucus membranes or skin necrosis: No Has patient had a PCN reaction that required hospitalization: No Has patient had a PCN reaction occurring within the last 10 years: No If all of the above answers are "NO", then may proceed with Cephalosporin use.     VITALS:  Blood pressure (!) 157/81, pulse 71, temperature 97.6 F (36.4 C), temperature source Oral, resp. rate 18, height 6' (1.829 m), weight 104.6 kg (230 lb 9.6 oz), SpO2 99 %.  PHYSICAL EXAMINATION:   Physical Exam  GENERAL:  51 y.o.-year-old patient lying in the bed with no acute distress.  EYES: Pupils equal, round, reactive to light and accommodation. No scleral icterus. Extraocular muscles intact.  HEENT: Head atraumatic, normocephalic. Oropharynx and nasopharynx clear.  NECK:  Supple, no jugular venous distention. No thyroid enlargement, no tenderness.  LUNGS: Normal breath sounds bilaterally, no wheezing, rales,rhonchi or crepitation. No use of accessory muscles of respiration.  CARDIOVASCULAR: S1, S2 normal. No murmurs, rubs, or gallops.  ABDOMEN: Soft, nontender, nondistended. Bowel sounds present. No organomegaly or mass.  EXTREMITIES: No pedal edema, cyanosis, or clubbing.  NEUROLOGIC: Cranial nerves II through XII are intact. Muscle strength 5/5 in all extremities. Sensation intact. Gait not checked.  PSYCHIATRIC: The patient is alert and oriented x 3.  SKIN: No obvious rash, lesion, or ulcer.    LABORATORY PANEL:   CBC  Recent Labs Lab 02/23/17 1608  WBC 8.2  HGB 14.3  HCT 42.4  PLT 415   ------------------------------------------------------------------------------------------------------------------  Chemistries   Recent Labs Lab 02/23/17 1608 02/24/17 0627  NA 139 140  K 4.0 3.5  CL 103 109  CO2  22 25  GLUCOSE 129* 108*  BUN 18 16  CREATININE 2.38* 1.57*  CALCIUM 9.7 8.9  AST 27  --   ALT 24  --   ALKPHOS 91  --   BILITOT 0.6  --     ------------------------------------------------------------------------------------------------------------------  Cardiac Enzymes No results for input(s): TROPONINI in the last 168 hours. ------------------------------------------------------------------------------------------------------------------  RADIOLOGY:  Ct Head Wo Contrast  Result Date: 02/23/2017 CLINICAL DATA:  Leg weakness. EXAM: CT HEAD WITHOUT CONTRAST TECHNIQUE: Contiguous axial images were obtained from the base of the skull through the vertex without intravenous contrast. COMPARISON:  None. FINDINGS: Brain: No evidence of acute infarction, hemorrhage, hydrocephalus, extra-axial collection or mass lesion/mass effect. Cerebral and cerebellar atrophy with compensatory dilatation of the ventricles. Mild periventricular white matter and corona radiata hypodensities favor chronic ischemic microvascular white matter disease. Vascular: Atherosclerotic vascular calcification of the carotid siphons. Rash that intracranial atherosclerotic vascular calcifications. No hyperdense vessel. Skull: Prior left temporal craniotomy.  No fracture or focal lesion. Sinuses/Orbits: The bilateral paranasal sinuses and mastoid air cells are clear. The orbits are unremarkable. Other: None. IMPRESSION: No acute intracranial abnormality. Cerebral and cerebellar atrophy, slightly advanced for age. Electronically Signed   By: Obie Dredge M.D.   On: 02/23/2017 16:27   US Renal  Result Date: 02/24/2017 CLINICAL DATA:  51 year old male with history of acute renal injury. Evaluate for potential hydronephrosis. EXAM: RENAL / URINARY TRACT ULTRASOUND COMPLETE COMPARISON:  No priors. FINDINGS: Right Kidney: Length: 10.8 cm. Echogenicity within normal limits. No mass or hydronephrosis visualized. Left Kidney: Length: 10.5 cm. Echogenicity within normal limits. Small echogenic but nonshadowing foci are noted in the interpolar region of the left kidney measuring up  to 4 mm in diameter. No mass or hydronephrosis visualized. Bladder: Appears normal for degree of bladder distention. IMPRESSION: 1. No hydronephrosis. 2. Small echogenic foci in the interpolar region of the left kidney. These demonstrate no associated shadowing. These are of uncertain etiology and significance, and could represent tiny fatty lesions or nonshadowing stones. Electronically Signed   By: Trudie Reed M.D.   On: 02/24/2017 10:21     ASSESSMENT AND PLAN:   Active Problems:   Acute kidney injury (HCC)  #1 acute kidney injury: Receiving IV fluids, kidney function is better. Renal to sound is normal.   #2.sudden  left shaking spell followed by weakness in the legs and this is going on since February of this year has seen the neurologist at [facility] without any success; Consults neurology this time. \On reviewing medical records from [facility] patient had EEGin september there  which showed intermittent slowing in the left temporal area. Neurology felt episodes are due tol poor brain  perfusion in the setting of CVA and hypovolemia. Patient had orthostatic changes in the blood pressure conformer with high-dose Seroquel so Seroquel dose is decreased to 50 mg daily from 100 mg daily.  #3 essential hypertension: Controlled #4, hyperlipidemia: Continue statins. #5 history of previous CVA,., Carotid stenosis, history of left carotid endarterectomy: Continue aspirin, statins., Plavix. #6 multiple sclerosis with recent flare 2 weeks ago: Continue his multiple sclerosis medications including gabapentin, tecfidera. Recently admitted to [facility] September 15 for syncope.  All the records are reviewed and case discussed with Care Management/Social Workerr. Management plans discussed with the patient, family and they are in agreement.  CODE STATUS: full  TOTAL TIME TAKING CARE OF THIS PATIENT: .   POSSIBLE D/C IN 1-2DAYS, DEPENDING ON CLINICAL CONDITION.   Katha Hamming M.D on  02/24/2017 at 12:41 PM  Between 7am to 6pm - Pager - [phone]  After 6pm go to [email or web address] - password [email or web address] [facility]  [facility]  Office  [phone]  CC: Primary care physician; Center, [facility]

## 2017-02-24 NOTE — Progress Notes (Signed)
Physical Therapy Evaluation Patient Details Name: Cody Burns MRN: [id number] DOB: 1965/10/12 Today's Date: 02/24/2017   History of Present Illness  Pt admitted for acute kidney injury. 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 R ankle fx in May 2018. Pt states he has had episodes where his R leg starts shaking and feels like "it will give out" since April 2018.   Clinical Impression  Pt is a pleasant 51 year old male admitted for acute kidney injury. Pt performs bed mobility with I., transfers with mod. I., and amb with RW with CGA. Pt amb a total of 230 ft, amb one lap around nurse's station with chair follow for safety. No reports of R leg shakiness or LOB. Pt demonstrates deficits with LE strength and amb. Pt states he occasionally uses a cane for mobility, however recommend use of RW for extra support during transfers/amb. Pt appeared motivated to participate in PT activities. Would benefit from skilled PT to address above deficits and promote optimal return to home. Recommend transition to outpatient PT upon DC from acute hospitalization.     Follow Up Recommendations Outpatient PT    Equipment Recommendations  None recommended by PT (Pt has RW)    Recommendations for Other Services       Precautions / Restrictions Precautions Precautions: Fall Restrictions Weight Bearing Restrictions: No      Mobility  Bed Mobility Overal bed mobility: Independent             General bed mobility comments: Pt able to rise from supine to sit with proper technique, no cues or assistance needed.  Transfers Overall transfer level: Needs assistance Equipment used: Rolling walker (2 wheeled) Transfers: Sit to/from Stand Sit to Stand: Modified independent (Device/Increase time)         General transfer comment: Pt able to rise from seated EOB to standing with RW with proper technique, no cues or assistance  needed  Ambulation/Gait Ambulation/Gait assistance: +2 safety/equipment;Min guard (chair follow ) Ambulation Distance (Feet): 230 Feet Assistive device: Rolling walker (2 wheeled) Gait Pattern/deviations: Step-through pattern     General Gait Details: Pt amb with alternating step-through gait, able to maintain steady walking pace. Noted increased stance time on LLE, pt states "my left leg feels longer ever since my ankle fx". No leg shaking, LOB noted.   Stairs            Wheelchair Mobility    Modified Rankin (Stroke Patients Only)       Balance Overall balance assessment: Needs assistance Sitting-balance support: Feet supported Sitting balance-Leahy Scale: Good Sitting balance - Comments: Pt able to sit at EOB and maintain position.    Standing balance support: Bilateral upper extremity supported Standing balance-Leahy Scale: Good Standing balance comment: Pt able to stand at EOB with RW and maintain position, no LOB.                              Pertinent Vitals/Pain Pain Assessment: No/denies pain    Home Living Family/patient expects to be discharged to:: Private residence Living Arrangements: Alone Available Help at Discharge: Family Type of Home: Apartment Home Access: Level entry     Home Layout: One level Home Equipment: Environmental consultant - 2 wheels;Walker - 4 wheels;Cane - single point;Electric scooter Additional Comments: Pt has placed chairs around his home in case he feels like his R leg will give out and he needs  to sit.     Prior Function Level of Independence: Independent with assistive device(s)         Comments: Pt states he experiences his R leg "giving out" 1-2x a week, and will try to grab onto furniture or move to a chair until the feeling passes. Pt states he typically does not use an assistive device, however will use a SPC or RW for mobility when needed. Pt is able to perform all ADLs, go shopping independently     Hand Dominance         Extremity/Trunk Assessment   Upper Extremity Assessment Upper Extremity Assessment: Generalized weakness (UE MMT grossly 4/5)    Lower Extremity Assessment Lower Extremity Assessment: Generalized weakness (LE MMT grossly 4/5)       Communication   Communication: No difficulties  Cognition Arousal/Alertness: Awake/alert Behavior During Therapy: WFL for tasks assessed/performed Overall Cognitive Status: Within Functional Limits for tasks assessed                                        General Comments      Exercises Other Exercises Other Exercises: Supine ther-ex both sides 10x, ankle pumps, SLRs, hip abd/add. Cues for proper technique.    Assessment/Plan    PT Assessment Patient needs continued PT services  PT Problem List Decreased strength;Decreased knowledge of use of DME;Decreased mobility       PT Treatment Interventions DME instruction;Gait training;Stair training;Functional mobility training;Therapeutic activities;Therapeutic exercise;Balance training;Patient/family education    PT Goals (Current goals can be found in the Care Plan section)  Acute Rehab PT Goals Patient Stated Goal: to return home PT Goal Formulation: With patient Time For Goal Achievement: 03/10/17 Potential to Achieve Goals: Good    Frequency Min 2X/week   Barriers to discharge        Co-evaluation               AM-PAC PT "6 Clicks" Daily Activity  Outcome Measure Difficulty turning over in bed (including adjusting bedclothes, sheets and blankets)?: None Difficulty moving from lying on back to sitting on the side of the bed? : None Difficulty sitting down on and standing up from a chair with arms (e.g., wheelchair, bedside commode, etc,.)?: None Help needed moving to and from a bed to chair (including a wheelchair)?: None Help needed walking in hospital room?: A Little Help needed climbing 3-5 steps with a railing? : A Little 6 Click Score: 22    End  of Session Equipment Utilized During Treatment: Gait belt Activity Tolerance: Patient tolerated treatment well Patient left: in chair;with call bell/phone within reach;with chair alarm set   PT Visit Diagnosis: Other abnormalities of gait and mobility (R26.89);Muscle weakness (generalized) (M62.81);Repeated falls (R29.6)    Time: 1610-9604 PT Time Calculation (min) (ACUTE ONLY): 34 min   Charges:         PT G Codes:   PT G-Codes **NOT FOR INPATIENT CLASS** Functional Assessment Tool Used: AM-PAC 6 Clicks Basic Mobility Functional Limitation: Mobility: Walking and moving around Mobility: Walking and Moving Around Current Status (V4098): At least 20 percent but less than 40 percent impaired, limited or restricted Mobility: Walking and Moving Around Goal Status [phone]): At least 1 percent but less than 20 percent impaired, limited or restricted    Renford Dills, SPT  Renford Dills 02/24/2017, 5:08 PM

## 2017-02-24 NOTE — Care Management Note (Signed)
Case Management Note  Patient Details  Name: Cody Burns MRN: [id number] Date of Birth: [birth year]  Subjective/Objective:      Admitted to [facility] with the diagnosis of acute kidney injury. Lives alone. Brother is Furqan Gosselin [phone]). Last was at [facility] 2 weeks ago to see a Child psychotherapist. Gets physician services at [facility]. Medications are obtained in [place] for Home Health services last May. No skilled nursing. No home oxygen. Rolling walker, cane, scooter, shower chair and grab bars in the home. Life Alert in the home, but his land line is not working. Takes care of all basic activities of daily living himself, little driving. Last fall was 1 month ago. Fair appetite.  Brother will transport.               Action/Plan: Refusal of transfer to [place] signed and faxed to [facility] facility. Signed forms placed on the chart   Expected Discharge Date:  02/25/17               Expected Discharge Plan:     In-House Referral:     Discharge planning Services     Post Acute Care Choice:    Choice offered to:     DME Arranged:    DME Agency:     HH Arranged:    HH Agency:     Status of Service:     If discussed at [employer], dates discussed:    Additional Comments:  Gwenette Greet, RN MSN CCM Care Management [phone] 02/24/2017, 11:18 AM

## 2017-02-25 DIAGNOSIS — G35 Multiple sclerosis: Secondary | ICD-10-CM

## 2017-02-25 DIAGNOSIS — R259 Unspecified abnormal involuntary movements: Secondary | ICD-10-CM

## 2017-02-25 LAB — HIV ANTIBODY (ROUTINE TESTING W REFLEX): HIV SCREEN 4TH GENERATION: NONREACTIVE

## 2017-02-25 MED ORDER — BACLOFEN 10 MG PO TABS: 10.0000 mg | ORAL_TABLET | Freq: Two times a day (BID) | ORAL | 0 refills | Status: DC

## 2017-02-25 NOTE — Consult Note (Signed)
Reason for Consult:Leg shaking Referring Physician: Luberta Mutter  CC: Leg shaking  HPI: Cody Burns is an 51 y.o. male who reports that for many months he has had episodes of right leg shaking.  The right leg will shake and he is unable to control it.  It then spreads over to the left leg and he becomes unable to control his gait and falls. He does not reports loss of consciousness.  He has seen neurologists at the [place] and [place].  No etiology has been noted.  Patient with a history of MS.  Reports spastic gait that has become more spastic over time and gait less steady.    Past Medical History:  Diagnosis Date  . 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 cause of his ankle break. awaiting neuro workup  . Hypertension   . Hypothyroidism   . Multiple sclerosis (HCC) 1990  . Myocardial infarct (HCC)   . Optic neuritis    x 3. takes steriods and it resolves.  . Seizures (HCC)    as a child. diagnosed with MS and once treated, seizures resolved.  . Sleep apnea     Past Surgical History:  Procedure Laterality Date  . CORONARY ARTERY BYPASS GRAFT  2015  . JOINT REPLACEMENT Left 2007   left hip replacement  . ORIF ANKLE FRACTURE Right 09/22/2016   Procedure: OPEN REDUCTION INTERNAL FIXATION (ORIF) ANKLE FRACTURE;  Surgeon: Christena Flake, MD;  Location: [facility] ORS;  Service: Orthopedics;  Laterality: Right;  . quadruple cardiac bypass  2015    History reviewed. No pertinent family history.  Social History:  reports that he has never smoked. He uses smokeless tobacco. He reports that he drinks alcohol. He reports that he does not use drugs.  Allergies  Allergen Reactions  . Hydrochlorothiazide W-Triamterene Other (See Comments)    High blood pressure  . Metoprolol Other (See Comments)    Exacerbates MS, High blood pressure  . Penicillins Other (See Comments)    Sores in mouth Has patient had a PCN reaction causing immediate rash, facial/tongue/throat swelling, SOB or  lightheadedness with hypotension: No Has patient had a PCN reaction causing severe rash involving mucus membranes or skin necrosis: No Has patient had a PCN reaction that required hospitalization: No Has patient had a PCN reaction occurring within the last 10 years: No If all of the above answers are "NO", then may proceed with Cephalosporin use.     Medications:  I have reviewed the patient's current medications. Prior to Admission:  Prior to Admission medications   Medication Sig Start Date End Date Taking? Authorizing Provider  amLODipine (NORVASC) 10 MG tablet Take 5 mg by mouth daily.   Yes [provider]  aspirin EC 81 MG tablet Take 81 mg by mouth daily.   Yes [provider]  atorvastatin (LIPITOR) 80 MG tablet Take 80 mg by mouth at bedtime.   Yes [provider]  buPROPion (WELLBUTRIN XL) 300 MG 24 hr tablet Take 300 mg by mouth daily.   Yes [provider]  calcium-vitamin D (OSCAL WITH D) 500-200 MG-UNIT tablet Take 1 tablet by mouth daily.   Yes [provider]  clopidogrel (PLAVIX) 75 MG tablet Take 75 mg by mouth daily. 11/25/16 11/25/17 Yes [provider]  cyanocobalamin 1000 MCG tablet Take 1,000 mcg by mouth daily.   Yes [provider]  Dimethyl Fumarate (TECFIDERA) 240 MG CPDR Take 240 mg by mouth 2 (two) times daily.   Yes  [provider]  escitalopram (LEXAPRO) 10 MG tablet Take 20 mg by mouth daily.   Yes [provider]  folic acid (FOLVITE) 1 MG tablet Take 1 mg by mouth daily. 11/25/16 11/25/17 Yes [provider]  gabapentin (NEURONTIN) 300 MG capsule Take 900 mg by mouth 3 (three) times daily.    Yes [provider]  levothyroxine (SYNTHROID, LEVOTHROID) 25 MCG tablet Take 25 mcg by mouth daily before breakfast.    Yes [provider]  lisinopril (PRINIVIL,ZESTRIL) 10 MG tablet Take 10 mg by mouth daily.   Yes [provider]  montelukast (SINGULAIR) 10  MG tablet Take 10 mg by mouth daily.    Yes [provider]  niacin (NIASPAN) 500 MG CR tablet Take 500 mg by mouth at bedtime. 11/24/16 11/24/17 Yes [provider]  oxyCODONE (OXY IR/ROXICODONE) 5 MG immediate release tablet Take 1-2 tablets (5-10 mg total) by mouth every 4 (four) hours as needed for breakthrough pain. 09/23/16  Yes Anson [place], PA-C  QUEtiapine (SEROQUEL) 200 MG tablet Take 100 mg by mouth at bedtime.   Yes [provider]  sennosides-docusate sodium (SENOKOT-S) 8.6-50 MG tablet Take 2 tablets by mouth 2 (two) times daily.   Yes [provider]  traMADol (ULTRAM) 50 MG tablet Take 50 mg by mouth 2 (two) times daily as needed for moderate pain or severe pain.   Yes [provider]  baclofen (LIORESAL) 10 MG tablet Take 1 tablet (10 mg total) by mouth 2 (two) times daily. 02/25/17   Katha Hamming, MD       ROS: History obtained from the patient  General ROS: negative for - chills, fatigue, fever, night sweats, weight gain or weight loss Psychological ROS: negative for - behavioral disorder, hallucinations, memory difficulties, mood swings or suicidal ideation Ophthalmic ROS: negative for - blurry vision, double vision, eye pain or loss of vision ENT ROS: negative for - epistaxis, nasal discharge, oral lesions, sore throat, tinnitus or vertigo Allergy and Immunology ROS: negative for - hives or itchy/watery eyes Hematological and Lymphatic ROS: negative for - bleeding problems, bruising or swollen lymph nodes Endocrine ROS: negative for - galactorrhea, hair pattern changes, polydipsia/polyuria or temperature intolerance Respiratory ROS: negative for - cough, hemoptysis, shortness of breath or wheezing Cardiovascular ROS: negative for - chest pain, dyspnea on exertion, edema or irregular heartbeat Gastrointestinal ROS: negative for - abdominal pain, diarrhea, hematemesis, nausea/vomiting or stool incontinence Genito-Urinary  ROS: negative for - dysuria, hematuria, incontinence or urinary frequency/urgency Musculoskeletal ROS: negative for - joint swelling or muscular weakness Neurological ROS: as noted in HPI Dermatological ROS: negative for rash and skin lesion changes  Physical Examination: Blood pressure (!) 159/95, pulse 81, temperature 98.3 F (36.8 C), temperature source Oral, resp. rate 18, height 6' (1.829 m), weight 104.6 kg (230 lb 9.6 oz), SpO2 99 %.  HEENT-  Normocephalic, no lesions, without obvious abnormality.  Normal external eye and conjunctiva.  Normal TM's bilaterally.  Normal auditory canals and external ears. Normal external nose, mucus membranes and septum.  Normal pharynx. Cardiovascular- S1, S2 normal, pulses palpable throughout   Lungs- chest clear, no wheezing, rales, normal symmetric air entry Abdomen- soft, non-tender; bowel sounds normal; no masses,  no organomegaly Extremities- no edema Lymph-no adenopathy palpable Musculoskeletal-no joint tenderness, deformity or swelling Skin-warm and dry, no hyperpigmentation, vitiligo, or suspicious lesions  Neurological Examination   Mental Status: Alert, oriented, thought content appropriate.  Speech fluent without evidence of aphasia.  Able to follow  3 step commands without difficulty. Cranial Nerves: II: Discs flat bilaterally; Visual fields grossly normal, pupils equal, round, reactive to light and accommodation III,IV, VI: ptosis not present, extra-ocular motions intact bilaterally V,VII: decreased right NLF, facial light touch sensation normal bilaterally VIII: hearing normal bilaterally IX,X: gag reflex present XI: bilateral shoulder shrug XII: midline tongue extension Motor: Right : Upper extremity   5/5    Left:     Upper extremity   5/5  Lower extremity   5/5     Lower extremity   5/5 Increased tone in the lower extremities Sensory: Pinprick and light touch decreased below the knees bilaterally Deep Tendon Reflexes: 3+ and  symmetric throughout Plantars: Right: mute   Left: upgoing Cerebellar: Normal finger-to-nose and dysmetria with heel-to-shin testing of the RLE Gait: not tested due to safety concerns    Laboratory Studies:   Basic Metabolic Panel:  Recent Labs Lab 02/23/17 1608 02/24/17 0627  NA 139 140  K 4.0 3.5  CL 103 109  CO2 22 25  GLUCOSE 129* 108*  BUN 18 16  CREATININE 2.38* 1.57*  CALCIUM 9.7 8.9    Liver Function Tests:  Recent Labs Lab 02/23/17 1608  AST 27  ALT 24  ALKPHOS 91  BILITOT 0.6  PROT 7.6  ALBUMIN 4.5   No results for input(s): LIPASE, AMYLASE in the last 168 hours. No results for input(s): AMMONIA in the last 168 hours.  CBC:  Recent Labs Lab 02/23/17 1608  WBC 8.2  NEUTROABS 5.2  HGB 14.3  HCT 42.4  MCV 87.7  PLT 415    Cardiac Enzymes: No results for input(s): CKTOTAL, CKMB, CKMBINDEX, TROPONINI in the last 168 hours.  BNP: Invalid input(s): POCBNP  CBG: No results for input(s): GLUCAP in the last 168 hours.  Microbiology: Results for orders placed or performed during the hospital encounter of 09/21/16  Surgical PCR screen     Status: None   Collection Time: 09/22/16  3:05 AM  Result Value Ref Range Status   MRSA, PCR NEGATIVE NEGATIVE Final   Staphylococcus aureus NEGATIVE NEGATIVE Final    Comment:        The Xpert SA Assay (FDA approved for NASAL specimens in patients over 57 years of age), is one component of a comprehensive surveillance program.  Test performance has been validated by [facility] for patients greater than or equal to 59 year old. It is not intended to diagnose infection nor to guide or monitor treatment.     Coagulation Studies: No results for input(s): LABPROT, INR in the last 72 hours.  Urinalysis: No results for input(s): COLORURINE, LABSPEC, PHURINE, GLUCOSEU, HGBUR, BILIRUBINUR, KETONESUR, PROTEINUR, UROBILINOGEN, NITRITE, LEUKOCYTESUR in the last 168 hours.  Invalid input(s):  APPERANCEUR  Lipid Panel:  No results found for: CHOL, TRIG, HDL, CHOLHDL, VLDL, LDLCALC  HgbA1C: No results found for: HGBA1C  Urine Drug Screen:  No results found for: LABOPIA, COCAINSCRNUR, LABBENZ, AMPHETMU, THCU, LABBARB  Alcohol Level: No results for input(s): ETH in the last 168 hours.  Imaging: Ct Head Wo Contrast  Result Date: 02/23/2017 CLINICAL DATA:  Leg weakness. EXAM: CT HEAD WITHOUT CONTRAST TECHNIQUE: Contiguous axial images were obtained from the base of the skull through the vertex without intravenous contrast. COMPARISON:  None. FINDINGS: Brain: No evidence of acute infarction, hemorrhage, hydrocephalus, extra-axial collection or mass lesion/mass effect. Cerebral and cerebellar atrophy with compensatory dilatation of the ventricles. Mild periventricular white matter and corona radiata hypodensities favor chronic ischemic microvascular white matter disease.  Vascular: Atherosclerotic vascular calcification of the carotid siphons. Rash that intracranial atherosclerotic vascular calcifications. No hyperdense vessel. Skull: Prior left temporal craniotomy.  No fracture or focal lesion. Sinuses/Orbits: The bilateral paranasal sinuses and mastoid air cells are clear. The orbits are unremarkable. Other: None. IMPRESSION: No acute intracranial abnormality. Cerebral and cerebellar atrophy, slightly advanced for age. Electronically Signed   By: Obie Dredge M.D.   On: 02/23/2017 16:27   US Renal  Result Date: 02/24/2017 CLINICAL DATA:  51 year old male with history of acute renal injury. Evaluate for potential hydronephrosis. EXAM: RENAL / URINARY TRACT ULTRASOUND COMPLETE COMPARISON:  No priors. FINDINGS: Right Kidney: Length: 10.8 cm. Echogenicity within normal limits. No mass or hydronephrosis visualized. Left Kidney: Length: 10.5 cm. Echogenicity within normal limits. Small echogenic but nonshadowing foci are noted in the interpolar region of the left kidney measuring up to 4 mm in  diameter. No mass or hydronephrosis visualized. Bladder: Appears normal for degree of bladder distention. IMPRESSION: 1. No hydronephrosis. 2. Small echogenic foci in the interpolar region of the left kidney. These demonstrate no associated shadowing. These are of uncertain etiology and significance, and could represent tiny fatty lesions or nonshadowing stones. Electronically Signed   By: Trudie Reed M.D.   On: 02/24/2017 10:21     Assessment/Plan: 51 year old male with a history of MS presenting with complaints of lower extremity involuntary movements.  Head CT reviewed and shows no acute changes.  These movements are likely related to his spasticity.  Patient on Baclofen but notices these spells more in the afternoon.  Would not like to be too aggressive with the Baclofen though since this may cause the patient to lose too much tone and be too weak in the lower extremities to walk.    Recommendations: 1.  Increase Baclofen to  in AM,  midday and  qhs 2.  Patient to follow up with neurology at [facility].  Does not wish to continue follow up at the [place].  Case discussed with Dr. Cherylann Banas, MD Neurology [phone] 02/25/2017, 1:24 PM

## 2017-02-25 NOTE — Progress Notes (Signed)
Medications administered by student RN [id number] with supervision of Clinical Instructor Esther Bradstreet MSN, RN-BC or patient's assigned RN.   
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
## 2017-02-25 NOTE — Progress Notes (Signed)
MD order received to discharge pt home today; verbally reviewed AVS with pt, gave Rx for Baclofen to pt; gave pt back his personal home medication of Dimethyl Fumarate that he was taking here; no questions voiced at this time; pt's discharge pending arrival of his ride home

## 2017-03-29 DIAGNOSIS — M25552 Pain in left hip: Secondary | ICD-10-CM | POA: Diagnosis not present

## 2017-03-29 DIAGNOSIS — R29898 Other symptoms and signs involving the musculoskeletal system: Secondary | ICD-10-CM | POA: Diagnosis not present

## 2017-03-29 DIAGNOSIS — Z96642 Presence of left artificial hip joint: Secondary | ICD-10-CM | POA: Diagnosis not present

## 2017-04-05 DIAGNOSIS — M25552 Pain in left hip: Secondary | ICD-10-CM | POA: Insufficient documentation

## 2017-06-05 ENCOUNTER — Other Ambulatory Visit: Payer: Self-pay

## 2017-06-05 ENCOUNTER — Emergency Department: Payer: Medicare Other

## 2017-06-05 ENCOUNTER — Encounter: Payer: Self-pay | Admitting: Emergency Medicine

## 2017-06-05 ENCOUNTER — Inpatient Hospital Stay: Payer: Medicare Other

## 2017-06-05 ENCOUNTER — Inpatient Hospital Stay
Admission: EM | Admit: 2017-06-05 | Discharge: 2017-06-11 | DRG: 247 | Disposition: A | Discharge status: Skilled Nursing Facility | Payer: Medicare Other | Attending: Internal Medicine | Admitting: Internal Medicine

## 2017-06-05 DIAGNOSIS — D649 Anemia, unspecified: Secondary | ICD-10-CM | POA: Diagnosis present

## 2017-06-05 DIAGNOSIS — R0789 Other chest pain: Secondary | ICD-10-CM | POA: Diagnosis not present

## 2017-06-05 DIAGNOSIS — Z880 Allergy status to penicillin: Secondary | ICD-10-CM | POA: Diagnosis not present

## 2017-06-05 DIAGNOSIS — Z7902 Long term (current) use of antithrombotics/antiplatelets: Secondary | ICD-10-CM

## 2017-06-05 DIAGNOSIS — I252 Old myocardial infarction: Secondary | ICD-10-CM | POA: Diagnosis not present

## 2017-06-05 DIAGNOSIS — I214 Non-ST elevation (NSTEMI) myocardial infarction: Principal | ICD-10-CM | POA: Diagnosis present

## 2017-06-05 DIAGNOSIS — Z79899 Other long term (current) drug therapy: Secondary | ICD-10-CM

## 2017-06-05 DIAGNOSIS — N179 Acute kidney failure, unspecified: Secondary | ICD-10-CM | POA: Diagnosis present

## 2017-06-05 DIAGNOSIS — Z96642 Presence of left artificial hip joint: Secondary | ICD-10-CM | POA: Diagnosis present

## 2017-06-05 DIAGNOSIS — M25559 Pain in unspecified hip: Secondary | ICD-10-CM

## 2017-06-05 DIAGNOSIS — M25552 Pain in left hip: Secondary | ICD-10-CM | POA: Diagnosis not present

## 2017-06-05 DIAGNOSIS — Z951 Presence of aortocoronary bypass graft: Secondary | ICD-10-CM

## 2017-06-05 DIAGNOSIS — E860 Dehydration: Secondary | ICD-10-CM

## 2017-06-05 DIAGNOSIS — I2510 Atherosclerotic heart disease of native coronary artery without angina pectoris: Secondary | ICD-10-CM | POA: Diagnosis present

## 2017-06-05 DIAGNOSIS — G4730 Sleep apnea, unspecified: Secondary | ICD-10-CM | POA: Diagnosis present

## 2017-06-05 DIAGNOSIS — S0990XA Unspecified injury of head, initial encounter: Secondary | ICD-10-CM | POA: Diagnosis not present

## 2017-06-05 DIAGNOSIS — I25790 Atherosclerosis of other coronary artery bypass graft(s) with unstable angina pectoris: Secondary | ICD-10-CM | POA: Diagnosis not present

## 2017-06-05 DIAGNOSIS — R0602 Shortness of breath: Secondary | ICD-10-CM

## 2017-06-05 DIAGNOSIS — Z888 Allergy status to other drugs, medicaments and biological substances status: Secondary | ICD-10-CM | POA: Diagnosis not present

## 2017-06-05 DIAGNOSIS — M79605 Pain in left leg: Secondary | ICD-10-CM | POA: Diagnosis present

## 2017-06-05 DIAGNOSIS — R748 Abnormal levels of other serum enzymes: Secondary | ICD-10-CM | POA: Diagnosis not present

## 2017-06-05 DIAGNOSIS — E785 Hyperlipidemia, unspecified: Secondary | ICD-10-CM | POA: Diagnosis not present

## 2017-06-05 DIAGNOSIS — I959 Hypotension, unspecified: Secondary | ICD-10-CM | POA: Diagnosis not present

## 2017-06-05 DIAGNOSIS — R42 Dizziness and giddiness: Secondary | ICD-10-CM | POA: Diagnosis not present

## 2017-06-05 DIAGNOSIS — M542 Cervicalgia: Secondary | ICD-10-CM | POA: Diagnosis not present

## 2017-06-05 DIAGNOSIS — F17290 Nicotine dependence, other tobacco product, uncomplicated: Secondary | ICD-10-CM | POA: Diagnosis present

## 2017-06-05 DIAGNOSIS — I248 Other forms of acute ischemic heart disease: Secondary | ICD-10-CM | POA: Diagnosis present

## 2017-06-05 DIAGNOSIS — M6281 Muscle weakness (generalized): Secondary | ICD-10-CM | POA: Diagnosis not present

## 2017-06-05 DIAGNOSIS — S79912A Unspecified injury of left hip, initial encounter: Secondary | ICD-10-CM | POA: Diagnosis not present

## 2017-06-05 DIAGNOSIS — R079 Chest pain, unspecified: Secondary | ICD-10-CM | POA: Diagnosis not present

## 2017-06-05 DIAGNOSIS — E039 Hypothyroidism, unspecified: Secondary | ICD-10-CM | POA: Diagnosis present

## 2017-06-05 DIAGNOSIS — W1830XA Fall on same level, unspecified, initial encounter: Secondary | ICD-10-CM | POA: Diagnosis present

## 2017-06-05 DIAGNOSIS — I200 Unstable angina: Secondary | ICD-10-CM | POA: Diagnosis not present

## 2017-06-05 DIAGNOSIS — I10 Essential (primary) hypertension: Secondary | ICD-10-CM | POA: Diagnosis present

## 2017-06-05 DIAGNOSIS — I951 Orthostatic hypotension: Secondary | ICD-10-CM | POA: Diagnosis present

## 2017-06-05 DIAGNOSIS — E782 Mixed hyperlipidemia: Secondary | ICD-10-CM | POA: Diagnosis present

## 2017-06-05 DIAGNOSIS — G35 Multiple sclerosis: Secondary | ICD-10-CM | POA: Diagnosis present

## 2017-06-05 DIAGNOSIS — S199XXA Unspecified injury of neck, initial encounter: Secondary | ICD-10-CM | POA: Diagnosis not present

## 2017-06-05 DIAGNOSIS — Z9889 Other specified postprocedural states: Secondary | ICD-10-CM | POA: Diagnosis not present

## 2017-06-05 LAB — CBC
HCT: 35.2 % — ABNORMAL LOW (ref 40.0–52.0)
HEMOGLOBIN: 11.9 g/dL — AB (ref 13.0–18.0)
MCH: 31.1 pg (ref 26.0–34.0)
MCHC: 33.7 g/dL (ref 32.0–36.0)
MCV: 92.2 fL (ref 80.0–100.0)
Platelets: 359 10*3/uL (ref 150–440)
RBC: 3.82 MIL/uL — ABNORMAL LOW (ref 4.40–5.90)
RDW: 14.6 % — ABNORMAL HIGH (ref 11.5–14.5)
WBC: 9.9 10*3/uL (ref 3.8–10.6)

## 2017-06-05 LAB — PROTIME-INR
INR: 0.8
PROTHROMBIN TIME: 11 s — AB (ref 11.4–15.2)

## 2017-06-05 LAB — BASIC METABOLIC PANEL
ANION GAP: 10 (ref 5–15)
BUN: 24 mg/dL — ABNORMAL HIGH (ref 6–20)
CALCIUM: 9 mg/dL (ref 8.9–10.3)
CO2: 22 mmol/L (ref 22–32)
Chloride: 104 mmol/L (ref 101–111)
Creatinine, Ser: 2.37 mg/dL — ABNORMAL HIGH (ref 0.61–1.24)
GFR calc Af Amer: 34 mL/min — ABNORMAL LOW (ref >60)
GFR calc non Af Amer: 30 mL/min — ABNORMAL LOW (ref >60)
GLUCOSE: 147 mg/dL — AB (ref 65–99)
Potassium: 3.9 mmol/L (ref 3.5–5.1)
Sodium: 136 mmol/L (ref 135–145)

## 2017-06-05 LAB — MAGNESIUM: Magnesium: 2.1 mg/dL (ref 1.7–2.4)

## 2017-06-05 LAB — HEPARIN LEVEL (UNFRACTIONATED): HEPARIN UNFRACTIONATED: 0.4 [IU]/mL (ref 0.30–0.70)

## 2017-06-05 LAB — TSH: TSH: 1.215 u[IU]/mL (ref 0.350–4.500)

## 2017-06-05 LAB — TROPONIN I
TROPONIN I: 0.07 ng/mL — AB (ref <0.03)
Troponin I: 0.22 ng/mL (ref <0.03)
Troponin I: 0.38 ng/mL (ref <0.03)

## 2017-06-05 LAB — APTT: aPTT: 24 seconds (ref 24–36)

## 2017-06-05 MED ORDER — NITROGLYCERIN IN D5W 200-5 MCG/ML-% IV SOLN
0.0000 ug/min | INTRAVENOUS | Status: DC
  Administered 2017-06-05 – 2017-06-06 (×3): 5 ug/min via INTRAVENOUS
  Filled 2017-06-05: qty 250

## 2017-06-05 MED ORDER — MORPHINE SULFATE (PF) 2 MG/ML IV SOLN
2.0000 mg | Freq: Once | INTRAVENOUS | Status: AC
  Administered 2017-06-05: 2 mg via INTRAVENOUS

## 2017-06-05 MED ORDER — HEPARIN BOLUS VIA INFUSION
2000.0000 [IU] | Freq: Once | INTRAVENOUS | Status: AC
  Administered 2017-06-05: 2000 [IU] via INTRAVENOUS
  Filled 2017-06-05: qty 2000

## 2017-06-05 MED ORDER — ATORVASTATIN CALCIUM 20 MG PO TABS
80.0000 mg | ORAL_TABLET | Freq: Every day | ORAL | Status: DC
  Administered 2017-06-05 – 2017-06-10 (×6): 80 mg via ORAL
  Filled 2017-06-05: qty 4
  Filled 2017-06-05: qty 8
  Filled 2017-06-05 (×2): qty 4
  Filled 2017-06-05: qty 8
  Filled 2017-06-05: qty 4

## 2017-06-05 MED ORDER — CYCLOBENZAPRINE HCL 10 MG PO TABS
10.0000 mg | ORAL_TABLET | Freq: Three times a day (TID) | ORAL | Status: DC
  Administered 2017-06-05 – 2017-06-11 (×20): 10 mg via ORAL
  Filled 2017-06-05 (×20): qty 1

## 2017-06-05 MED ORDER — VITAMIN B-12 1000 MCG PO TABS
1000.0000 ug | ORAL_TABLET | Freq: Every day | ORAL | Status: DC
  Administered 2017-06-05 – 2017-06-11 (×7): 1000 ug via ORAL
  Filled 2017-06-05 (×7): qty 1

## 2017-06-05 MED ORDER — CALCIUM CARBONATE-VITAMIN D 500-200 MG-UNIT PO TABS
1.0000 | ORAL_TABLET | Freq: Every day | ORAL | Status: DC
  Administered 2017-06-05 – 2017-06-11 (×7): 1 via ORAL
  Filled 2017-06-05 (×7): qty 1

## 2017-06-05 MED ORDER — ACETAMINOPHEN 325 MG PO TABS
650.0000 mg | ORAL_TABLET | Freq: Four times a day (QID) | ORAL | Status: DC | PRN
  Administered 2017-06-05: 650 mg via ORAL
  Filled 2017-06-05: qty 2

## 2017-06-05 MED ORDER — HEPARIN SODIUM (PORCINE) 5000 UNIT/ML IJ SOLN
5000.0000 [IU] | Freq: Three times a day (TID) | INTRAMUSCULAR | Status: DC
  Administered 2017-06-05: 5000 [IU] via SUBCUTANEOUS
  Filled 2017-06-05: qty 1

## 2017-06-05 MED ORDER — FOLIC ACID 1 MG PO TABS
1.0000 mg | ORAL_TABLET | Freq: Every day | ORAL | Status: DC
  Administered 2017-06-05 – 2017-06-11 (×7): 1 mg via ORAL
  Filled 2017-06-05 (×7): qty 1

## 2017-06-05 MED ORDER — ASPIRIN 325 MG PO TABS
325.0000 mg | ORAL_TABLET | Freq: Every day | ORAL | Status: DC
  Administered 2017-06-05 – 2017-06-11 (×6): 325 mg via ORAL
  Filled 2017-06-05 (×7): qty 1

## 2017-06-05 MED ORDER — LEVOTHYROXINE SODIUM 25 MCG PO TABS
25.0000 ug | ORAL_TABLET | Freq: Every day | ORAL | Status: DC
  Administered 2017-06-06 – 2017-06-11 (×6): 25 ug via ORAL
  Filled 2017-06-05 (×6): qty 1

## 2017-06-05 MED ORDER — ESCITALOPRAM OXALATE 10 MG PO TABS
20.0000 mg | ORAL_TABLET | Freq: Every day | ORAL | Status: DC
  Administered 2017-06-05 – 2017-06-11 (×7): 20 mg via ORAL
  Filled 2017-06-05 (×4): qty 2
  Filled 2017-06-05: qty 1
  Filled 2017-06-05 (×2): qty 2

## 2017-06-05 MED ORDER — NIACIN ER (ANTIHYPERLIPIDEMIC) 500 MG PO TBCR
500.0000 mg | EXTENDED_RELEASE_TABLET | Freq: Every day | ORAL | Status: DC
  Administered 2017-06-05 – 2017-06-10 (×6): 500 mg via ORAL
  Filled 2017-06-05 (×8): qty 1

## 2017-06-05 MED ORDER — SODIUM CHLORIDE 0.9 % IV BOLUS (SEPSIS)
1000.0000 mL | Freq: Once | INTRAVENOUS | Status: AC
  Administered 2017-06-05: 1000 mL via INTRAVENOUS

## 2017-06-05 MED ORDER — HEPARIN (PORCINE) IN NACL 100-0.45 UNIT/ML-% IJ SOLN
1200.0000 [IU]/h | INTRAMUSCULAR | Status: DC
  Administered 2017-06-05 – 2017-06-06 (×2): 1200 [IU]/h via INTRAVENOUS
  Filled 2017-06-05 (×3): qty 250

## 2017-06-05 MED ORDER — SODIUM CHLORIDE 0.9 % IV SOLN
INTRAVENOUS | Status: DC
  Administered 2017-06-05 – 2017-06-08 (×6): via INTRAVENOUS

## 2017-06-05 MED ORDER — MONTELUKAST SODIUM 10 MG PO TABS
10.0000 mg | ORAL_TABLET | Freq: Every day | ORAL | Status: DC
  Administered 2017-06-05 – 2017-06-11 (×7): 10 mg via ORAL
  Filled 2017-06-05 (×7): qty 1

## 2017-06-05 MED ORDER — DIMETHYL FUMARATE 240 MG PO CPDR
240.0000 mg | DELAYED_RELEASE_CAPSULE | Freq: Two times a day (BID) | ORAL | Status: DC
  Administered 2017-06-06 – 2017-06-11 (×9): 240 mg via ORAL
  Filled 2017-06-05 (×12): qty 1

## 2017-06-05 MED ORDER — MORPHINE SULFATE (PF) 2 MG/ML IV SOLN
2.0000 mg | INTRAVENOUS | Status: DC | PRN
  Administered 2017-06-05 – 2017-06-06 (×2): 2 mg via INTRAVENOUS
  Filled 2017-06-05 (×3): qty 1

## 2017-06-05 MED ORDER — TRAMADOL HCL 50 MG PO TABS: 50.0000 mg | ORAL_TABLET | Freq: Two times a day (BID) | ORAL | Status: DC | PRN

## 2017-06-05 MED ORDER — ONDANSETRON HCL 4 MG/2ML IJ SOLN: 4.0000 mg | Freq: Four times a day (QID) | INTRAMUSCULAR | Status: DC | PRN

## 2017-06-05 MED ORDER — ACETAMINOPHEN 650 MG RE SUPP: 650.0000 mg | Freq: Four times a day (QID) | RECTAL | Status: DC | PRN

## 2017-06-05 MED ORDER — OXYCODONE HCL 5 MG PO TABS
5.0000 mg | ORAL_TABLET | ORAL | Status: DC | PRN
  Administered 2017-06-06 – 2017-06-10 (×3): 10 mg via ORAL
  Filled 2017-06-05 (×3): qty 2

## 2017-06-05 MED ORDER — BUPROPION HCL ER (XL) 150 MG PO TB24
300.0000 mg | ORAL_TABLET | Freq: Every day | ORAL | Status: DC
  Administered 2017-06-05 – 2017-06-11 (×7): 300 mg via ORAL
  Filled 2017-06-05 (×7): qty 2

## 2017-06-05 MED ORDER — QUETIAPINE FUMARATE 100 MG PO TABS
100.0000 mg | ORAL_TABLET | Freq: Every day | ORAL | Status: DC
  Administered 2017-06-05 – 2017-06-10 (×6): 100 mg via ORAL
  Filled 2017-06-05: qty 1
  Filled 2017-06-05: qty 4
  Filled 2017-06-05 (×2): qty 1
  Filled 2017-06-05: qty 4
  Filled 2017-06-05: qty 1
  Filled 2017-06-05 (×2): qty 4
  Filled 2017-06-05 (×3): qty 1

## 2017-06-05 MED ORDER — GABAPENTIN 300 MG PO CAPS
900.0000 mg | ORAL_CAPSULE | Freq: Three times a day (TID) | ORAL | Status: DC
  Administered 2017-06-05 – 2017-06-11 (×20): 900 mg via ORAL
  Filled 2017-06-05 (×20): qty 3

## 2017-06-05 MED ORDER — NITROGLYCERIN 2 % TD OINT
0.5000 [in_us] | TOPICAL_OINTMENT | Freq: Four times a day (QID) | TRANSDERMAL | Status: DC
  Administered 2017-06-05: 0.5 [in_us] via TOPICAL
  Filled 2017-06-05: qty 1

## 2017-06-05 MED ORDER — SENNOSIDES-DOCUSATE SODIUM 8.6-50 MG PO TABS
2.0000 | ORAL_TABLET | Freq: Two times a day (BID) | ORAL | Status: DC
  Administered 2017-06-05 – 2017-06-11 (×13): 2 via ORAL
  Filled 2017-06-05 (×14): qty 2

## 2017-06-05 MED ORDER — CLOPIDOGREL BISULFATE 75 MG PO TABS
75.0000 mg | ORAL_TABLET | Freq: Every day | ORAL | Status: DC
  Administered 2017-06-05 – 2017-06-11 (×7): 75 mg via ORAL
  Filled 2017-06-05 (×7): qty 1

## 2017-06-05 MED ORDER — ASPIRIN EC 81 MG PO TBEC
81.0000 mg | DELAYED_RELEASE_TABLET | Freq: Every day | ORAL | Status: DC
  Administered 2017-06-05: 81 mg via ORAL
  Filled 2017-06-05: qty 1

## 2017-06-05 MED ORDER — ONDANSETRON HCL 4 MG PO TABS: 4.0000 mg | ORAL_TABLET | Freq: Four times a day (QID) | ORAL | Status: DC | PRN

## 2017-06-05 NOTE — ED Notes (Signed)
Pt assisted to the restroom

## 2017-06-05 NOTE — Progress Notes (Signed)
[facility] - [place] at [facility]                                                                                                                                                                                  Patient Demographics   Cody Burns, is a 51 y.o. male, DOB - 10/10/1965, ZOX:[id number]  Admit date - 06/05/2017   Admitting Physician Auburn Bilberry, MD  Outpatient Primary MD for the patient is [facility]   LOS - 0  Subjective: Called by nurse due to patient having chest pain and feeling short of breath    Review of Systems:   CONSTITUTIONAL: No documented fever. No fatigue, weakness. No weight gain, no weight loss.  EYES: No blurry or double vision.  ENT: No tinnitus. No postnasal drip. No redness of the oropharynx.  RESPIRATORY: No cough, no wheeze, no hemoptysis.  Positive dyspnea.  CARDIOVASCULAR: Positive chest pain. No orthopnea. No palpitations. No syncope.  GASTROINTESTINAL: No nausea, no vomiting or diarrhea. No abdominal pain. No melena or hematochezia.  GENITOURINARY: No dysuria or hematuria.  ENDOCRINE: No polyuria or nocturia. No heat or cold intolerance.  HEMATOLOGY: No anemia. No bruising. No bleeding.  INTEGUMENTARY: No rashes. No lesions.  MUSCULOSKELETAL: No arthritis. No swelling. No gout.  NEUROLOGIC: No numbness, tingling, or ataxia. No seizure-type activity.  PSYCHIATRIC: No anxiety. No insomnia. No ADD.    Vitals:   Vitals:   06/05/17 1224 06/05/17 1409 06/05/17 1541 06/05/17 1606  BP: (!) 155/77  (!) 183/98 (!) 160/95  Pulse: 99  (!) 108 (!) 105  Resp:    14  Temp:  98.4 F (36.9 C)  98.1 F (36.7 C)  TempSrc:  Oral  Oral  SpO2: 100%  100% 100%  Weight:      Height:        Wt Readings from Last 3 Encounters:  06/05/17 223 lb (101.2 kg)  02/23/17 230 lb 9.6 oz (104.6 kg)  09/22/16 238 lb (108 kg)     Intake/Output Summary (Last 24 hours) at 06/05/2017 1631 Last data filed at 06/05/2017 1431 Gross  per 24 hour  Intake 0 ml  Output -  Net 0 ml    Physical Exam:   GENERAL: Pleasant-appearing in no apparent distress.  HEAD, EYES, EARS, NOSE AND THROAT: Atraumatic, normocephalic. Extraocular muscles are intact. Pupils equal and reactive to light. Sclerae anicteric. No conjunctival injection. No oro-pharyngeal erythema.  NECK: Supple. There is no jugular venous distention. No bruits, no lymphadenopathy, no thyromegaly.  HEART: Regular rate and rhythm,. No murmurs, no rubs, no clicks.  LUNGS: Clear to auscultation bilaterally. No rales or rhonchi. No wheezes.  ABDOMEN: Soft,  flat, nontender, nondistended. Has good bowel sounds. No hepatosplenomegaly appreciated.  EXTREMITIES: No evidence of any cyanosis, clubbing, or peripheral edema.  +2 pedal and radial pulses bilaterally.  NEUROLOGIC: The patient is alert, awake, and oriented x3 with no focal motor or sensory deficits appreciated bilaterally.  SKIN: Moist and warm with no rashes appreciated.  Psych: Not anxious, depressed LN: No inguinal LN enlargement    Antibiotics   Anti-infectives (From admission, onward)   None      Medications   Scheduled Meds: . aspirin  325 mg Oral Daily  . atorvastatin  80 mg Oral QHS  . buPROPion  300 mg Oral Daily  . calcium-vitamin D  1 tablet Oral Daily  . clopidogrel  75 mg Oral Daily  . cyclobenzaprine  10 mg Oral TID  . Dimethyl Fumarate  240 mg Oral BID  . escitalopram  20 mg Oral Daily  . folic acid  1 mg Oral Daily  . gabapentin  900 mg Oral TID  . [START ON 06/06/2017] levothyroxine  25 mcg Oral QAC breakfast  . montelukast  10 mg Oral Daily  . niacin  500 mg Oral QHS  . nitroGLYCERIN  0.5 inch Topical Q6H  . QUEtiapine  100 mg Oral QHS  . senna-docusate  2 tablet Oral BID  . cyanocobalamin  1,000 mcg Oral Daily   Continuous Infusions: . sodium chloride 125 mL/hr at 06/05/17 1032  . heparin 1,200 Units/hr (06/05/17 1609)   PRN Meds:.acetaminophen **OR** acetaminophen,  morphine injection, ondansetron **OR** ondansetron (ZOFRAN) IV, oxyCODONE, traMADol   Data Review:   Micro Results No results found for this or any previous visit (from the past 240 hour(s)).  Radiology Reports Ct Head Wo Contrast  Result Date: 06/05/2017 CLINICAL DATA:  Fall without loss of consciousness.  Dizziness. EXAM: CT HEAD WITHOUT CONTRAST TECHNIQUE: Contiguous axial images were obtained from the base of the skull through the vertex without intravenous contrast. COMPARISON:  February 23, 2017 FINDINGS: Brain: No subdural, epidural, or subarachnoid hemorrhage. Cerebellum, brainstem, and basal cisterns are unremarkable. Ventricles and sulci are prominent but stable. Mild white matter changes are stable. No acute cortical ischemia or infarct identified. No mass, mass effect, or midline shift. Vascular: Atherosclerotic vascular calcification of the carotid siphons. Skull: A craniotomy defect on the left is stable. Sinuses/Orbits: No acute finding. Other: None. IMPRESSION: No acute intracranial abnormalities. Electronically Signed   By: Gerome Sam III M.D   On: 06/05/2017 07:11   Ct Cervical Spine Wo Contrast  Result Date: 06/05/2017 CLINICAL DATA:  Neck pain, fall EXAM: CT CERVICAL SPINE WITHOUT CONTRAST TECHNIQUE: Multidetector CT imaging of the cervical spine was performed without intravenous contrast. Multiplanar CT image reconstructions were also generated. COMPARISON:  06/05/2017 FINDINGS: Alignment: Normal Skull base and vertebrae: No fracture Soft tissues and spinal canal: Prevertebral soft tissues are normal. No epidural or paraspinal hematoma. Disc levels: Anterior degenerative spurring at C4-5 and C5-6 and in the upper thoracic spine. Upper chest: No acute findings Other: No acute findings carotid artery calcifications. IMPRESSION: No acute bony abnormality. Electronically Signed   By: Charlett Nose M.D.   On: 06/05/2017 09:57   Dg Chest Port 1 View  Result Date:  06/05/2017 CLINICAL DATA:  Chest pressure since this afternoon. EXAM: PORTABLE CHEST 1 VIEW COMPARISON:  None. FINDINGS: Normal sized heart. Tortuous aorta. Post CABG changes. Clear lungs with normal vascularity. Unremarkable bones. IMPRESSION: No acute abnormality. Electronically Signed   By: Beckie Salts M.D.   On: 06/05/2017 16:20  CBC Recent Labs  Lab 06/05/17 0517  WBC 9.9  HGB 11.9*  HCT 35.2*  PLT 359  MCV 92.2  MCH 31.1  MCHC 33.7  RDW 14.6*    Chemistries  Recent Labs  Lab 06/05/17 0517  NA 136  K 3.9  CL 104  CO2 22  GLUCOSE 147*  BUN 24*  CREATININE 2.37*  CALCIUM 9.0  MG 2.1   ------------------------------------------------------------------------------------------------------------------ estimated creatinine clearance is 44.4 mL/min (A) (by C-G formula based on SCr of 2.37 mg/dL (H)). ------------------------------------------------------------------------------------------------------------------ No results for input(s): HGBA1C in the last 72 hours. ------------------------------------------------------------------------------------------------------------------ No results for input(s): CHOL, HDL, LDLCALC, TRIG, CHOLHDL, LDLDIRECT in the last 72 hours. ------------------------------------------------------------------------------------------------------------------ Recent Labs    06/05/17 1243  TSH 1.215   ------------------------------------------------------------------------------------------------------------------ No results for input(s): VITAMINB12, FOLATE, FERRITIN, TIBC, IRON, RETICCTPCT in the last 72 hours.  Coagulation profile No results for input(s): INR, PROTIME in the last 168 hours.  No results for input(s): DDIMER in the last 72 hours.  Cardiac Enzymes Recent Labs  Lab 06/05/17 0517 06/05/17 1243  TROPONINI 0.07* 0.22*    ------------------------------------------------------------------------------------------------------------------ Invalid input(s): POCBNP    Assessment & Plan   Patient is 52 year old admitted with acute renal failure now with chest pain  1.  Chest pain we will cycle cardiac enzymes troponin is elevated I have discussed with cardiology to see the patient Dr. Gwen Pounds aware I will start patient on a heparin drip Start him on nitro patch Place him on a low-dose beta Continue aspirin You use as needed morphine  2.  Shortness of breath I have ordered a stat chest x-ray  3.  Acute renal failure IV fluids     Code Status Orders  (From admission, onward)        Start     Ordered   06/05/17 1012  Full code  Continuous     06/05/17 1011    Code Status History    Date Active Date Inactive Code Status Order ID Comments User Context   02/23/2017 19:08 02/25/2017 19:49 Full Code [id number]  Adrian Saran, MD Inpatient   09/22/2016 02:52 09/22/2016 17:58 Full Code [id number]  PoggiExcell Seltzer, MD Inpatient    Advance Directive Documentation     Most Recent Value  Type of Advance Directive  Healthcare Power of Attorney  Pre-existing out of facility DNR order (yellow form or pink MOST form)  No data  "MOST" Form in Place?  No data           Consults  cards   DVT Prophylaxis heparin  Lab Results  Component Value Date   PLT 359 06/05/2017     Time Spent in minutes   Greater than 50% of time spent in care coordination and counseling patient regarding the condition and plan of care.   Auburn Bilberry M.D on 06/05/2017 at 4:31 PM  Between 7am to 6pm - Pager - [phone]  After 6pm go to [email or web address] - password [email or web address] [facility]  [facility]   Office  [phone]

## 2017-06-05 NOTE — Progress Notes (Signed)
Pt. Has mild  tremors in hands

## 2017-06-05 NOTE — ED Provider Notes (Signed)
[facility] Emergency Department Provider Note   ____________________________________________   First MD Initiated Contact with Patient 06/05/17 0530     (approximate)  I have reviewed the triage vital signs and the nursing notes.   HISTORY  Chief Complaint Fall and Dizziness    HPI Cody Burns is a 52 y.o. male who comes into the hospital today with some cramping in his legs.  He reports that it was really bad.  If he moves he feels like his legs are cramping up worse.  The patient states that the symptoms started about 2-3 days ago.  Tonight he fell on the floor.  He was sleeping in bed and tried to get up to use the restroom when he felt very dizzy.  The patient states that the room was spinning.  He had no strength in his legs and he fell to the floor.  He reports that he did hit the top of his head.  She reports that he does have some neck pain as well.  He states that the dizziness is improved but gets worse when he sits up or stands up.  The patient denies any chest pains or shortness of breath.  He denies any nausea or vomiting.  He reports that he is dehydrated and he has not been drinking a lot.  The patient is here today for evaluation.  Past Medical History:  Diagnosis Date  . 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 cause of his ankle break. awaiting neuro workup  . Hypertension   . Hypothyroidism   . Multiple sclerosis (HCC) 1990  . Myocardial infarct (HCC)   . Optic neuritis    x 3. takes steriods and it resolves.  . Seizures (HCC)    as a child. diagnosed with MS and once treated, seizures resolved.  . Sleep apnea     Patient Active Problem List   Diagnosis Date Noted  . Acute renal failure (ARF) (HCC) 06/05/2017  . Acute kidney injury (HCC) 02/23/2017  . Closed right ankle fracture 09/22/2016    Past Surgical History:  Procedure Laterality Date  . CORONARY ARTERY BYPASS GRAFT  2015  . JOINT REPLACEMENT Left 2007    left hip replacement  . ORIF ANKLE FRACTURE Right 09/22/2016   Procedure: OPEN REDUCTION INTERNAL FIXATION (ORIF) ANKLE FRACTURE;  Surgeon: Christena Flake, MD;  Location: [facility] ORS;  Service: Orthopedics;  Laterality: Right;  . quadruple cardiac bypass  2015    Prior to Admission medications   Medication Sig Start Date End Date Taking? Authorizing Provider  amLODipine (NORVASC) 10 MG tablet Take 5 mg by mouth daily.   Yes [provider]  aspirin EC 81 MG tablet Take 81 mg by mouth daily.   Yes [provider]  atorvastatin (LIPITOR) 80 MG tablet Take 80 mg by mouth at bedtime.   Yes [provider]  buPROPion (WELLBUTRIN XL) 300 MG 24 hr tablet Take 300 mg by mouth daily.   Yes [provider]  calcium-vitamin D (OSCAL WITH D) 500-200 MG-UNIT tablet Take 1 tablet by mouth daily.   Yes [provider]  clopidogrel (PLAVIX) 75 MG tablet Take 75 mg by mouth daily. 11/25/16 11/25/17 Yes [provider]  cyanocobalamin 1000 MCG tablet Take 1,000 mcg by mouth daily.   Yes [provider]  cyclobenzaprine (FLEXERIL) 10 MG tablet Take 10 mg by mouth 3 (three) times daily.   Yes [provider]  Dimethyl Fumarate (TECFIDERA) 240  MG CPDR Take 240 mg by mouth 2 (two) times daily.   Yes [provider]  escitalopram (LEXAPRO) 20 MG tablet Take 20 mg by mouth daily.   Yes [provider]  folic acid (FOLVITE) 1 MG tablet Take 1 mg by mouth daily. 11/25/16 11/25/17 Yes [provider]  gabapentin (NEURONTIN) 300 MG capsule Take 900 mg by mouth 3 (three) times daily.    Yes [provider]  levothyroxine (SYNTHROID, LEVOTHROID) 25 MCG tablet Take 25 mcg by mouth daily before breakfast.    Yes [provider]  lisinopril (PRINIVIL,ZESTRIL) 20 MG tablet Take 20 mg by mouth daily.    Yes [provider]  montelukast (SINGULAIR) 10 MG tablet Take 10 mg by mouth daily.    Yes [provider]  niacin (NIASPAN) 500 MG CR tablet Take 500 mg by mouth at bedtime. 11/24/16 11/24/17 Yes [provider]  oxyCODONE (OXY IR/ROXICODONE) 5 MG immediate release tablet Take 1-2 tablets (5-10 mg total) by mouth every 4 (four) hours as needed for breakthrough pain. 09/23/16  Yes Anson [place], PA-C  QUEtiapine (SEROQUEL) 100 MG tablet Take 100 mg by mouth at bedtime.   Yes [provider]  sennosides-docusate sodium (SENOKOT-S) 8.6-50 MG tablet Take 2 tablets by mouth 2 (two) times daily.   Yes [provider]  traMADol (ULTRAM) 50 MG tablet Take 50 mg by mouth 2 (two) times daily as needed for moderate pain or severe pain.   Yes [provider]    Allergies Hydrochlorothiazide w-triamterene; Metoprolol; and Penicillins  No family history on file.  Social History Social History   Tobacco Use  . Smoking status: Never Smoker  . Smokeless tobacco: Current User  . Tobacco comment: rarely uses smokeless tobacco  Substance Use Topics  . Alcohol use: Yes    Comment: maybe a 6 pack per week  . Drug use: No    Review of Systems  Constitutional: No fever/chills Eyes: No visual changes. ENT: No sore throat. Cardiovascular: Denies chest pain. Respiratory: Denies shortness of breath. Gastrointestinal: No abdominal pain.  No nausea, no vomiting.  No diarrhea.  No constipation. Genitourinary: Negative for dysuria. Musculoskeletal: Negative for back pain. Skin: Negative for rash. Neurological: Dizziness   ____________________________________________   PHYSICAL EXAM:  VITAL SIGNS: ED Triage Vitals  Enc Vitals Group     BP 06/05/17 0518 (!) 95/58     Pulse Rate 06/05/17 0518 (!) 103     Resp 06/05/17 0518 18     Temp 06/05/17 0518 97.8 F (36.6 C)     Temp Source 06/05/17 0518 Oral     SpO2 06/05/17 0518 99 %     Weight 06/05/17 0519 223 lb (101.2 kg)     Height 06/05/17 0519 6' (1.829 m)     Head Circumference --      Peak Flow --       Pain Score 06/05/17 0516 9     Pain Loc --      Pain Edu? --      Excl. in GC? --     Constitutional: Alert and oriented. Well appearing and in mild distress. Eyes: Conjunctivae are normal. PERRL. EOMI. Head: Atraumatic. Nose: No congestion/rhinnorhea. Mouth/Throat: Mucous membranes are dry.  Oropharynx non-erythematous. Cardiovascular: Tachycardia, regular rhythm. Grossly normal heart sounds.  Good peripheral circulation. Respiratory: Normal respiratory effort.  No retractions. Lungs CTAB. Gastrointestinal: Soft and nontender. No distention. Positive bowel sounds Musculoskeletal: No lower extremity tenderness nor edema.  Neurologic:  Normal speech and language.  Cranial nerves 2 through 12 are grossly intact with no focal motor neuro deficit Skin:  Skin is warm, dry and intact.  Psychiatric: Mood and affect are normal.   ____________________________________________   LABS (all labs ordered are listed, but only abnormal results are displayed)  Labs Reviewed  BASIC METABOLIC PANEL - Abnormal; Notable for the following components:      Result Value   Glucose, Bld 147 (*)    BUN 24 (*)    Creatinine, Ser 2.37 (*)    GFR calc non Af Amer 30 (*)    GFR calc Af Amer 34 (*)    All other components within normal limits  CBC - Abnormal; Notable for the following components:   RBC 3.82 (*)    Hemoglobin 11.9 (*)    HCT 35.2 (*)    RDW 14.6 (*)    All other components within normal limits  TROPONIN I - Abnormal; Notable for the following components:   Troponin I 0.07 (*)    All other components within normal limits  URINALYSIS, COMPLETE (UACMP) WITH MICROSCOPIC  MAGNESIUM  CBG MONITORING, ED   ____________________________________________  EKG  ED ECG REPORT I, Rebecka Apley, the attending physician, personally viewed and interpreted this ECG.   Date: 06/05/2017  EKG Time: 514  Rate: 100  Rhythm: normal sinus rhythm  Axis: normal  Intervals:left anterior  fascicular block  ST&T Change: flipped t waves in lead I, aVl, V2  ____________________________________________  RADIOLOGY  Ct Head Wo Contrast  Result Date: 06/05/2017 CLINICAL DATA:  Fall without loss of consciousness.  Dizziness. EXAM: CT HEAD WITHOUT CONTRAST TECHNIQUE: Contiguous axial images were obtained from the base of the skull through the vertex without intravenous contrast. COMPARISON:  February 23, 2017 FINDINGS: Brain: No subdural, epidural, or subarachnoid hemorrhage. Cerebellum, brainstem, and basal cisterns are unremarkable. Ventricles and sulci are prominent but stable. Mild white matter changes are stable. No acute cortical ischemia or infarct identified. No mass, mass effect, or midline shift. Vascular: Atherosclerotic vascular calcification of the carotid siphons. Skull: A craniotomy defect on the left is stable. Sinuses/Orbits: No acute finding. Other: None. IMPRESSION: No acute intracranial abnormalities. Electronically Signed   By: Gerome Sam III M.D   On: 06/05/2017 07:11    ____________________________________________   PROCEDURES  Procedure(s) performed: None  Procedures  Critical Care performed: No  ____________________________________________   INITIAL IMPRESSION / ASSESSMENT AND PLAN / ED COURSE  As part of my medical decision making, I reviewed the following data within the electronic MEDICAL RECORD NUMBER Notes from prior ED visits and [place] Controlled Substance Database   This is a 52 year old male who comes into the hospital today with dizziness.  When the patient arrived in the emergency department he was hypotensive.   We did give the patient a liter of normal saline and his blood pressure was 89 systolic.  He received a second liter of normal saline and his blood pressure was improved.  The patient received a CT scan of his head and he will receive a CT scan of his cervical spine.  We did check some blood work and the patient is dehydrated with a BUN  of 24 and a creatinine of 2.37.  The patient also had a troponin of 0.07.  The patient states that he still feels dizzy when he sits up so he will be admitted for further hydration and elevated troponin.      ____________________________________________  FINAL CLINICAL IMPRESSION(S) / ED DIAGNOSES  Final diagnoses:  Dehydration  Dizziness  Orthostatic hypotension     ED Discharge Orders    None       Note:  This document was prepared using Dragon voice recognition software and may include unintentional dictation errors.    Rebecka Apley, MD 06/05/17 [phone]

## 2017-06-05 NOTE — Progress Notes (Signed)
Pt began to complain of chest pressure. VS were checked. Dr. Allena Katz was on unit so he was called into room. Patel placed orders. Cassidy NT completed EKG orders. EKG sheet was given to [facility] to look at then placed in pts chart. Will continue to monitor pt.

## 2017-06-05 NOTE — Progress Notes (Signed)
ANTICOAGULATION CONSULT NOTE   Pharmacy Consult for heparin  Indication: chest pain/ACS  Allergies  Allergen Reactions  . Hydrochlorothiazide W-Triamterene Other (See Comments)    High blood pressure  . Metoprolol Other (See Comments)    Exacerbates MS, High blood pressure  . Penicillins Other (See Comments)    Sores in mouth Has patient had a PCN reaction causing immediate rash, facial/tongue/throat swelling, SOB or lightheadedness with hypotension: No Has patient had a PCN reaction causing severe rash involving mucus membranes or skin necrosis: No Has patient had a PCN reaction that required hospitalization: No Has patient had a PCN reaction occurring within the last 10 years: No If all of the above answers are "NO", then may proceed with Cephalosporin use.     Patient Measurements: Height: 6' (182.9 cm) Weight: 223 lb (101.2 kg) IBW/kg (Calculated) : 77.6 Heparin Dosing Weight: 98.2 kg  Vital Signs: Temp: 98.4 F (36.9 C) (01/12 1409) Temp Source: Oral (01/12 1409) BP: 183/98 (01/12 1541) Pulse Rate: 108 (01/12 1541)  Labs: Recent Labs    06/05/17 0517 06/05/17 1243  HGB 11.9*  --   HCT 35.2*  --   PLT 359  --   CREATININE 2.37*  --   TROPONINI 0.07* 0.22*    Estimated Creatinine Clearance: 44.4 mL/min (A) (by C-G formula based on SCr of 2.37 mg/dL (H)).   Medical History: Past Medical History:  Diagnosis Date  . 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 cause of his ankle break. awaiting neuro workup  . Hypertension   . Hypothyroidism   . Multiple sclerosis (HCC) 1990  . Myocardial infarct (HCC)   . Optic neuritis    x 3. takes steriods and it resolves.  . Seizures (HCC)    as a child. diagnosed with MS and once treated, seizures resolved.  . Sleep apnea     Medications:  Patient takes clopidogrel and aspirin at home and is not on any other blood thinners.   Assessment: 52 yo male presents dizziness and a fall. Once admitted he  began complaining of increasing chest pressure and an EKG was obtained. Pharmacy was consulted to dose and monitor heparin drip.   Patient had previously received one dose SQ heparin 5000 at 1446. Will be expecting baseline APTT to be slightly elevated. Additionally will give lower bolus of 2000 units as patient received SQ heparin an hour previous to drip.    Goal of Therapy:  Heparin level 0.3-0.7 units/ml Monitor platelets by anticoagulation protocol: Yes   Plan:  Give 2000 units bolus x 1 Start heparin infusion at 1200 units/hr Check anti-Xa level in 6 hours and daily while on heparin Continue to monitor H&H and platelets   Atha Starks Lifsey 06/05/2017,3:46 PM

## 2017-06-05 NOTE — ED Triage Notes (Signed)
Patient coming from home via [facility] for a fall but denies LOC but was dizzy and a BP 89/63. Patient complaining for left hamstring pain/ Patient has bruising to right arm and cut on right foot. Patient states he feels like he is dehydrated

## 2017-06-05 NOTE — Plan of Care (Signed)
Pt transferred this shift from [facility] . A&Ox4. VSS. RA . NSR on monitor. OOB with standby assist. IVF infusing per order.  Heparin gtt initiated this shift per order. Will continue to monitor and report to oncoming RN .  Progressing Education: Knowledge of General Education information will improve 06/05/2017 1649 - Progressing by Jodie Echevaria, RN Health Behavior/Discharge Planning: Ability to manage health-related needs will improve 06/05/2017 1649 - Progressing by Jodie Echevaria, RN Clinical Measurements: Ability to maintain clinical measurements within normal limits will improve 06/05/2017 1649 - Progressing by Jodie Echevaria, RN Will remain free from infection 06/05/2017 1649 - Progressing by Jodie Echevaria, RN Diagnostic test results will improve 06/05/2017 1649 - Progressing by Jodie Echevaria, RN Respiratory complications will improve 06/05/2017 1649 - Progressing by Jodie Echevaria, RN Cardiovascular complication will be avoided 06/05/2017 1649 - Progressing by Jodie Echevaria, RN Activity: Risk for activity intolerance will decrease 06/05/2017 1649 - Progressing by Jodie Echevaria, RN Nutrition: Adequate nutrition will be maintained 06/05/2017 1649 - Progressing by Jodie Echevaria, RN Coping: Level of anxiety will decrease 06/05/2017 1649 - Progressing by Jodie Echevaria, RN Elimination: Will not experience complications related to bowel motility 06/05/2017 1649 - Progressing by Jodie Echevaria, RN Will not experience complications related to urinary retention 06/05/2017 1649 - Progressing by Jodie Echevaria, RN Pain Managment: General experience of comfort will improve 06/05/2017 1649 - Progressing by Jodie Echevaria, RN Safety: Ability to remain free from injury will improve 06/05/2017 1649 - Progressing by Jodie Echevaria, RN Skin Integrity: Risk for impaired skin integrity will decrease 06/05/2017 1649 - Progressing by Jodie Echevaria, RN

## 2017-06-05 NOTE — H&P (Addendum)
[employer] [facility] - [place] at [facility]   PATIENT NAME: Cody Burns    MR#:  [id number]  DATE OF BIRTH:  05/22/1966  DATE OF ADMISSION:  06/05/2017  PRIMARY CARE PHYSICIAN: [facility]   REQUESTING/REFERRING PHYSICIAN:   CHIEF COMPLAINT:   Chief Complaint  Patient presents with  . Fall  . Dizziness    HISTORY OF PRESENT ILLNESS: Cody Burns  is a 52 y.o. male with a known history of essential hypertension, hypothyroidism, history of multiple sclerosis and previous seizures who is presenting with complaint of cramping in his leg.  The symptoms started 2-3 days ago.  He also fell on the floor.  He states that he has been feeling dizzy.  Recently.  And he felt very weak to get up.  Patient in the emergency room is noted to be dehydrated and noted to have elevated creatinine.  He denies any chest pain or shortness of breath. PAST MEDICAL HISTORY:   Past Medical History:  Diagnosis Date  . 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 cause of his ankle break. awaiting neuro workup  . Hypertension   . Hypothyroidism   . Multiple sclerosis (HCC) 1990  . Myocardial infarct (HCC)   . Optic neuritis    x 3. takes steriods and it resolves.  . Seizures (HCC)    as a child. diagnosed with MS and once treated, seizures resolved.  . Sleep apnea     PAST SURGICAL HISTORY:  Past Surgical History:  Procedure Laterality Date  . CORONARY ARTERY BYPASS GRAFT  2015  . JOINT REPLACEMENT Left 2007   left hip replacement  . ORIF ANKLE FRACTURE Right 09/22/2016   Procedure: OPEN REDUCTION INTERNAL FIXATION (ORIF) ANKLE FRACTURE;  Surgeon: Christena Flake, MD;  Location: [facility] ORS;  Service: Orthopedics;  Laterality: Right;  . quadruple cardiac bypass  2015    SOCIAL HISTORY:  Social History   Tobacco Use  . Smoking status: Never Smoker  . Smokeless tobacco: Current User  . Tobacco comment: rarely uses smokeless tobacco  Substance Use Topics  . Alcohol  use: Yes    Comment: maybe a 6 pack per week    FAMILY HISTORY:  Family History  Problem Relation Age of Onset  . Cancer Mother     DRUG ALLERGIES:  Allergies  Allergen Reactions  . Hydrochlorothiazide W-Triamterene Other (See Comments)    High blood pressure  . Metoprolol Other (See Comments)    Exacerbates MS, High blood pressure  . Penicillins Other (See Comments)    Sores in mouth Has patient had a PCN reaction causing immediate rash, facial/tongue/throat swelling, SOB or lightheadedness with hypotension: No Has patient had a PCN reaction causing severe rash involving mucus membranes or skin necrosis: No Has patient had a PCN reaction that required hospitalization: No Has patient had a PCN reaction occurring within the last 10 years: No If all of the above answers are "NO", then may proceed with Cephalosporin use.     REVIEW OF SYSTEMS:   CONSTITUTIONAL: No fever, positive fatigue positive weakness.  EYES: No blurred or double vision.  EARS, NOSE, AND THROAT: No tinnitus or ear pain.  RESPIRATORY: No cough, shortness of breath, wheezing or hemoptysis.  CARDIOVASCULAR: No chest pain, orthopnea, edema.  GASTROINTESTINAL: No nausea, vomiting, diarrhea or abdominal pain.  GENITOURINARY: No dysuria, hematuria.  ENDOCRINE: No polyuria, nocturia,  HEMATOLOGY: No anemia, easy bruising or bleeding SKIN: No rash or lesion. MUSCULOSKELETAL: No joint pain or  arthritis.   NEUROLOGIC: No tingling, numbness, weakness.  Positive dizziness PSYCHIATRY: No anxiety or depression.   MEDICATIONS AT HOME:  Prior to Admission medications   Medication Sig Start Date End Date Taking? Authorizing Provider  amLODipine (NORVASC) 10 MG tablet Take 5 mg by mouth daily.   Yes [provider]  aspirin EC 81 MG tablet Take 81 mg by mouth daily.   Yes [provider]  atorvastatin (LIPITOR) 80 MG tablet Take 80 mg by mouth at bedtime.   Yes [provider]  buPROPion  (WELLBUTRIN XL) 300 MG 24 hr tablet Take 300 mg by mouth daily.   Yes [provider]  calcium-vitamin D (OSCAL WITH D) 500-200 MG-UNIT tablet Take 1 tablet by mouth daily.   Yes [provider]  clopidogrel (PLAVIX) 75 MG tablet Take 75 mg by mouth daily. 11/25/16 11/25/17 Yes [provider]  cyanocobalamin 1000 MCG tablet Take 1,000 mcg by mouth daily.   Yes [provider]  cyclobenzaprine (FLEXERIL) 10 MG tablet Take 10 mg by mouth 3 (three) times daily.   Yes [provider]  Dimethyl Fumarate (TECFIDERA) 240 MG CPDR Take 240 mg by mouth 2 (two) times daily.   Yes [provider]  escitalopram (LEXAPRO) 20 MG tablet Take 20 mg by mouth daily.   Yes [provider]  folic acid (FOLVITE) 1 MG tablet Take 1 mg by mouth daily. 11/25/16 11/25/17 Yes [provider]  gabapentin (NEURONTIN) 300 MG capsule Take 900 mg by mouth 3 (three) times daily.    Yes [provider]  levothyroxine (SYNTHROID, LEVOTHROID) 25 MCG tablet Take 25 mcg by mouth daily before breakfast.    Yes [provider]  lisinopril (PRINIVIL,ZESTRIL) 20 MG tablet Take 20 mg by mouth daily.    Yes [provider]  montelukast (SINGULAIR) 10 MG tablet Take 10 mg by mouth daily.    Yes [provider]  niacin (NIASPAN) 500 MG CR tablet Take 500 mg by mouth at bedtime. 11/24/16 11/24/17 Yes [provider]  oxyCODONE (OXY IR/ROXICODONE) 5 MG immediate release tablet Take 1-2 tablets (5-10 mg total) by mouth every 4 (four) hours as needed for breakthrough pain. 09/23/16  Yes Anson Oregon, PA-C  QUEtiapine (SEROQUEL) 100 MG tablet Take 100 mg by mouth at bedtime.   Yes [provider]  sennosides-docusate sodium (SENOKOT-S) 8.6-50 MG tablet Take 2 tablets by mouth 2 (two) times daily.   Yes [provider]  traMADol (ULTRAM) 50 MG tablet Take 50 mg by mouth 2 (two) times daily as needed for moderate pain or  severe pain.   Yes [provider]      PHYSICAL EXAMINATION:   VITAL SIGNS: Blood pressure (!) 95/58, pulse (!) 103, temperature 97.8 F (36.6 C), temperature source Oral, resp. rate 18, height 6' (1.829 m), weight 223 lb (101.2 kg), SpO2 99 %.  GENERAL:  52 y.o.-year-old patient lying in the bed with no acute distress.  EYES: Pupils equal, round, reactive to light and accommodation. No scleral icterus. Extraocular muscles intact.  HEENT: Head atraumatic, normocephalic. Oropharynx and nasopharynx clear.  NECK:  Supple, no jugular venous distention. No thyroid enlargement, no tenderness.  LUNGS: Normal breath sounds bilaterally, no wheezing, rales,rhonchi or crepitation. No use of accessory muscles of respiration.  CARDIOVASCULAR: S1, S2 normal. No murmurs, rubs, or gallops.  ABDOMEN: Soft, nontender, nondistended. Bowel sounds present. No organomegaly or mass.  EXTREMITIES: No pedal edema, cyanosis, or clubbing.  NEUROLOGIC: Cranial  nerves II through XII are intact. Muscle strength 5/5 in all extremities. Sensation intact. Gait not checked.  PSYCHIATRIC: The patient is alert and oriented x 3.  SKIN: No obvious rash, lesion, or ulcer.   LABORATORY PANEL:   CBC Recent Labs  Lab 06/05/17 0517  WBC 9.9  HGB 11.9*  HCT 35.2*  PLT 359  MCV 92.2  MCH 31.1  MCHC 33.7  RDW 14.6*   ------------------------------------------------------------------------------------------------------------------  Chemistries  Recent Labs  Lab 06/05/17 0517  NA 136  K 3.9  CL 104  CO2 22  GLUCOSE 147*  BUN 24*  CREATININE 2.37*  CALCIUM 9.0   ------------------------------------------------------------------------------------------------------------------ estimated creatinine clearance is 44.4 mL/min (A) (by C-G formula based on SCr of 2.37 mg/dL (H)). ------------------------------------------------------------------------------------------------------------------ No results for  input(s): TSH, T4TOTAL, T3FREE, THYROIDAB in the last 72 hours.  Invalid input(s): FREET3   Coagulation profile No results for input(s): INR, PROTIME in the last 168 hours. ------------------------------------------------------------------------------------------------------------------- No results for input(s): DDIMER in the last 72 hours. -------------------------------------------------------------------------------------------------------------------  Cardiac Enzymes Recent Labs  Lab 06/05/17 0517  TROPONINI 0.07*   ------------------------------------------------------------------------------------------------------------------ Invalid input(s): POCBNP  ---------------------------------------------------------------------------------------------------------------  Urinalysis No results found for: COLORURINE, APPEARANCEUR, LABSPEC, PHURINE, GLUCOSEU, HGBUR, BILIRUBINUR, KETONESUR, PROTEINUR, UROBILINOGEN, NITRITE, LEUKOCYTESUR   RADIOLOGY: Ct Head Wo Contrast  Result Date: 06/05/2017 CLINICAL DATA:  Fall without loss of consciousness.  Dizziness. EXAM: CT HEAD WITHOUT CONTRAST TECHNIQUE: Contiguous axial images were obtained from the base of the skull through the vertex without intravenous contrast. COMPARISON:  February 23, 2017 FINDINGS: Brain: No subdural, epidural, or subarachnoid hemorrhage. Cerebellum, brainstem, and basal cisterns are unremarkable. Ventricles and sulci are prominent but stable. Mild white matter changes are stable. No acute cortical ischemia or infarct identified. No mass, mass effect, or midline shift. Vascular: Atherosclerotic vascular calcification of the carotid siphons. Skull: A craniotomy defect on the left is stable. Sinuses/Orbits: No acute finding. Other: None. IMPRESSION: No acute intracranial abnormalities. Electronically Signed   By: Gerome Sam III M.D   On: 06/05/2017 07:11    EKG: Orders placed or performed during the hospital encounter of  06/05/17  . EKG 12-Lead  . EKG 12-Lead  . ED EKG  . ED EKG    IMPRESSION AND PLAN: Patient is a 52 year old presenting with leg cramping and dizziness  1.  Acute renal failure This is related to dehydration Patient is hypotensive We will give IV fluids Discontinued his blood pressure medications  2.  Hypotension suspect due to dehydration hold blood pressure medications  3.  Elevated troponin due to demand ischemia Follow troponin levels  4.  Hyperlipidemia continue Lipitor  5.  Coronary artery disease continue Plavix and aspirin  6.  Hypothyroidism continue Synthroid  7.  Miscellaneous heparin for DVT prophylaxis  All the records are reviewed and case discussed with ED provider. Management plans discussed with the patient, family and they are in agreement.  CODE STATUS: Code Status History    Date Active Date Inactive Code Status Order ID Comments User Context   02/23/2017 19:08 02/25/2017 19:49 Full Code [id number]  Adrian Saran, MD Inpatient   09/22/2016 02:52 09/22/2016 17:58 Full Code [id number]  PoggiExcell Seltzer, MD Inpatient    Advance Directive Documentation     Most Recent Value  Type of Advance Directive  Healthcare Power of Attorney  Pre-existing out of facility DNR order (yellow form or pink MOST form)  No data  "MOST" Form in Place?  No data       TOTAL TIME TAKING  CARE OF THIS PATIENT: 55 minutes.    Auburn Bilberry M.D on 06/05/2017 at 9:25 AM  Between 7am to 6pm - Pager - [phone]  After 6pm go to [email or web address] - password [email or web address] [facility]  [facility]  Office  [phone]  CC: Primary care physician; [facility]

## 2017-06-05 NOTE — Progress Notes (Signed)
Spoke with Dr. Anne Hahn, pt. Has not had chest pain since beginning of shift. Will begin titrating down on the nitro. If pt. Does not have chest pain when at lowest dose will pause nitro per Dr. Anne Hahn. Will continue to monitor pt.

## 2017-06-05 NOTE — Progress Notes (Signed)
Pt started to complain of increasing chest pressure. Vitals were taken. Cody Burns was notified. Orders were placed to transfer pt to 2A room 259

## 2017-06-06 LAB — BASIC METABOLIC PANEL
Anion gap: 6 (ref 5–15)
BUN: 14 mg/dL (ref 6–20)
CHLORIDE: 109 mmol/L (ref 101–111)
CO2: 24 mmol/L (ref 22–32)
Calcium: 8.4 mg/dL — ABNORMAL LOW (ref 8.9–10.3)
Creatinine, Ser: 1.2 mg/dL (ref 0.61–1.24)
GFR calc non Af Amer: >60 mL/min (ref >60)
GLUCOSE: 108 mg/dL — AB (ref 65–99)
Potassium: 3.5 mmol/L (ref 3.5–5.1)
Sodium: 139 mmol/L (ref 135–145)

## 2017-06-06 LAB — HEPARIN LEVEL (UNFRACTIONATED): Heparin Unfractionated: 0.61 IU/mL (ref 0.30–0.70)

## 2017-06-06 LAB — CBC
HEMATOCRIT: 27.9 % — AB (ref 40.0–52.0)
HEMOGLOBIN: 9.2 g/dL — AB (ref 13.0–18.0)
MCH: 30.6 pg (ref 26.0–34.0)
MCHC: 33.1 g/dL (ref 32.0–36.0)
MCV: 92.5 fL (ref 80.0–100.0)
Platelets: 219 10*3/uL (ref 150–440)
RBC: 3.02 MIL/uL — ABNORMAL LOW (ref 4.40–5.90)
RDW: 14.6 % — ABNORMAL HIGH (ref 11.5–14.5)
WBC: 4.9 10*3/uL (ref 3.8–10.6)

## 2017-06-06 LAB — TROPONIN I: TROPONIN I: 1.21 ng/mL — AB (ref <0.03)

## 2017-06-06 MED ORDER — SODIUM CHLORIDE 0.9 % IV SOLN: 250.0000 mL | INTRAVENOUS | Status: DC | PRN

## 2017-06-06 MED ORDER — TROLAMINE SALICYLATE 10 % EX CREA
TOPICAL_CREAM | CUTANEOUS | Status: DC | PRN
  Filled 2017-06-06: qty 85

## 2017-06-06 MED ORDER — MUSCLE RUB 10-15 % EX CREA: TOPICAL_CREAM | CUTANEOUS | Status: DC | PRN

## 2017-06-06 MED ORDER — SODIUM CHLORIDE 0.9% FLUSH
3.0000 mL | Freq: Two times a day (BID) | INTRAVENOUS | Status: DC
  Administered 2017-06-06 – 2017-06-11 (×8): 3 mL via INTRAVENOUS

## 2017-06-06 MED ORDER — ASPIRIN 81 MG PO CHEW
81.0000 mg | CHEWABLE_TABLET | ORAL | Status: AC
  Administered 2017-06-07: 81 mg via ORAL
  Filled 2017-06-06: qty 1

## 2017-06-06 NOTE — Progress Notes (Signed)
paused per Dr. Anne Hahn, pt. is not having any chest pain and BP soft, will continue to monitor 90/52

## 2017-06-06 NOTE — Progress Notes (Signed)
Nitro gtt restarted, pt c/o burning pain in center of his chest, no radiation noted. Pt. Stated the burning sensation is resolving

## 2017-06-06 NOTE — Progress Notes (Signed)
ANTICOAGULATION CONSULT NOTE   Pharmacy Consult for heparin  Indication: chest pain/ACS  Allergies  Allergen Reactions  . Hydrochlorothiazide W-Triamterene Other (See Comments)    High blood pressure  . Metoprolol Other (See Comments)    Exacerbates MS, High blood pressure  . Penicillins Other (See Comments)    Sores in mouth Has patient had a PCN reaction causing immediate rash, facial/tongue/throat swelling, SOB or lightheadedness with hypotension: No Has patient had a PCN reaction causing severe rash involving mucus membranes or skin necrosis: No Has patient had a PCN reaction that required hospitalization: No Has patient had a PCN reaction occurring within the last 10 years: No If all of the above answers are "NO", then may proceed with Cephalosporin use.     Patient Measurements: Height: 6' (182.9 cm) Weight: 223 lb (101.2 kg) IBW/kg (Calculated) : 77.6 Heparin Dosing Weight: 98.2 kg  Vital Signs: Temp: 98 F (36.7 C) (01/13 0420) Temp Source: Oral (01/13 0420) BP: 109/58 (01/13 0420) Pulse Rate: 95 (01/13 0420)  Labs: Recent Labs    06/05/17 0517 06/05/17 1243 06/05/17 1608 06/05/17 2130 06/06/17 0600  HGB 11.9*  --   --   --  9.2*  HCT 35.2*  --   --   --  27.9*  PLT 359  --   --   --  219  APTT  --   --  24  --   --   LABPROT  --   --  11.0*  --   --   INR  --   --  0.80  --   --   HEPARINUNFRC  --   --   --  0.40 0.61  CREATININE 2.37*  --   --   --  1.20  TROPONINI 0.07* 0.22* 0.38*  --   --     Estimated Creatinine Clearance: 87.6 mL/min (by C-G formula based on SCr of 1.2 mg/dL).   Medical History: Past Medical History:  Diagnosis Date  . 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 cause of his ankle break. awaiting neuro workup  . Hypertension   . Hypothyroidism   . Multiple sclerosis (HCC) 1990  . Myocardial infarct (HCC)   . Optic neuritis    x 3. takes steriods and it resolves.  . Seizures (HCC)    as a child. diagnosed  with MS and once treated, seizures resolved.  . Sleep apnea     Medications:  Patient takes clopidogrel and aspirin at home and is not on any other blood thinners.   Assessment: 52 yo male presents dizziness and a fall. Once admitted he began complaining of increasing chest pressure and an EKG was obtained. Pharmacy was consulted to dose and monitor heparin drip.   Patient had previously received one dose SQ heparin 5000 at 1446. Will be expecting baseline APTT to be slightly elevated. Additionally will give lower bolus of 2000 units as patient received SQ heparin an hour previous to drip.    Goal of Therapy:  Heparin level 0.3-0.7 units/ml Monitor platelets by anticoagulation protocol: Yes   Plan:  Give 2000 units bolus x 1 Start heparin infusion at 1200 units/hr Check anti-Xa level in 6 hours and daily while on heparin Continue to monitor H&H and platelets   1/13:  HL @ 0600= 0.61.  hgb 9.2, Plt 219. Will continue current drip rate and recheck Heparin level with am labs.   Derrion Tritz A 06/06/2017,7:47 AM

## 2017-06-06 NOTE — Consult Note (Signed)
[facility] Cardiology Consultation Note  Patient ID: Cody Burns, MRN: [id number], DOB/AGE: 1965-08-11 52 y.o. Admit date: 06/05/2017   Date of Consult: 06/06/2017 Primary [facility]ician: [facility]  Chief Complaint:  Chief Complaint  Patient presents with  . Fall  . Dizziness   Reason for Consult: Chest pain  HPI: 52 y.o. male with known coronary artery disease status post coronary bypass graft and previous myocardial infarction sleep apnea essential hypertension mixed hyperlipidemia on appropriate medication management with dual antiplatelet therapy high intensity cholesterol therapy who is physically doing quite well until he had an episode of dehydration weakness and fatigue.  During his rehydration patient had an episode of burning substernal chest discomfort lasting for over an hour.  During this burning there was no EKG changes and his EKG shows normal sinus rhythm with left axis deviation.  Additionally he had nitroglycerin and heparin to relieve his chest discomfort completely.  After that he did have an elevation of troponin 0 0.07 up to 0.3 consistent with a possible non-ST elevation myocardial infarction.  He is now fully relieved of symptoms and kidney function has fully recovered on appropriate medication management  Past Medical History:  Diagnosis Date  . 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 cause of his ankle break. awaiting neuro workup  . Hypertension   . Hypothyroidism   . Multiple sclerosis (HCC) 1990  . Myocardial infarct (HCC)   . Optic neuritis    x 3. takes steriods and it resolves.  . Seizures (HCC)    as a child. diagnosed with MS and once treated, seizures resolved.  . Sleep apnea       Surgical History:  Past Surgical History:  Procedure Laterality Date  . CORONARY ARTERY BYPASS GRAFT  2015  . JOINT REPLACEMENT Left 2007   left hip replacement  . ORIF ANKLE FRACTURE  Right 09/22/2016   Procedure: OPEN REDUCTION INTERNAL FIXATION (ORIF) ANKLE FRACTURE;  Surgeon: Christena Flake, MD;  Location: [facility] ORS;  Service: Orthopedics;  Laterality: Right;  . quadruple cardiac bypass  2015     Home Meds: Prior to Admission medications   Medication Sig Start Date End Date Taking? Authorizing Provider  amLODipine (NORVASC) 10 MG tablet Take 5 mg by mouth daily.   Yes [provider]  aspirin EC 81 MG tablet Take 81 mg by mouth daily.   Yes [provider]  atorvastatin (LIPITOR) 80 MG tablet Take 80 mg by mouth at bedtime.   Yes [provider]  buPROPion (WELLBUTRIN XL) 300 MG 24 hr tablet Take 300 mg by mouth daily.   Yes [provider]  calcium-vitamin D (OSCAL WITH D) 500-200 MG-UNIT tablet Take 1 tablet by mouth daily.   Yes [provider]  clopidogrel (PLAVIX) 75 MG tablet Take 75 mg by mouth daily. 11/25/16 11/25/17 Yes [provider]  cyanocobalamin 1000 MCG tablet Take 1,000 mcg by mouth daily.   Yes [provider]  cyclobenzaprine (FLEXERIL) 10 MG tablet Take 10 mg by mouth 3 (three) times daily.   Yes [provider]  Dimethyl Fumarate (TECFIDERA) 240 MG CPDR Take 240 mg by mouth 2 (two) times daily.   Yes [provider]  escitalopram (LEXAPRO) 20 MG tablet Take 20 mg by mouth daily.   Yes [provider]  folic acid (FOLVITE) 1 MG tablet Take 1 mg by mouth daily. 11/25/16 11/25/17 Yes [provider]  gabapentin (NEURONTIN) 300 MG  capsule Take 900 mg by mouth 3 (three) times daily.    Yes [provider]  levothyroxine (SYNTHROID, LEVOTHROID) 25 MCG tablet Take 25 mcg by mouth daily before breakfast.    Yes [provider]  lisinopril (PRINIVIL,ZESTRIL) 20 MG tablet Take 20 mg by mouth daily.    Yes [provider]  montelukast (SINGULAIR) 10 MG tablet Take 10 mg by mouth daily.    Yes [provider]  niacin (NIASPAN) 500 MG CR  tablet Take 500 mg by mouth at bedtime. 11/24/16 11/24/17 Yes [provider]  oxyCODONE (OXY IR/ROXICODONE) 5 MG immediate release tablet Take 1-2 tablets (5-10 mg total) by mouth every 4 (four) hours as needed for breakthrough pain. 09/23/16  Yes Anson Oregon, PA-C  QUEtiapine (SEROQUEL) 100 MG tablet Take 100 mg by mouth at bedtime.   Yes [provider]  sennosides-docusate sodium (SENOKOT-S) 8.6-50 MG tablet Take 2 tablets by mouth 2 (two) times daily.   Yes [provider]  traMADol (ULTRAM) 50 MG tablet Take 50 mg by mouth 2 (two) times daily as needed for moderate pain or severe pain.   Yes [provider]    Inpatient Medications:  . aspirin  325 mg Oral Daily  . atorvastatin  80 mg Oral QHS  . buPROPion  300 mg Oral Daily  . calcium-vitamin D  1 tablet Oral Daily  . clopidogrel  75 mg Oral Daily  . cyclobenzaprine  10 mg Oral TID  . Dimethyl Fumarate  240 mg Oral BID  . escitalopram  20 mg Oral Daily  . folic acid  1 mg Oral Daily  . gabapentin  900 mg Oral TID  . levothyroxine  25 mcg Oral QAC breakfast  . montelukast  10 mg Oral Daily  . niacin  500 mg Oral QHS  . QUEtiapine  100 mg Oral QHS  . senna-docusate  2 tablet Oral BID  . sodium chloride flush  3 mL Intravenous Q12H  . cyanocobalamin  1,000 mcg Oral Daily   . sodium chloride 125 mL/hr at 06/06/17 0209  . heparin 1,200 Units/hr (06/05/17 1609)  . nitroGLYCERIN 5 mcg/min (06/06/17 0206)    Allergies:  Allergies  Allergen Reactions  . Hydrochlorothiazide W-Triamterene Other (See Comments)    High blood pressure  . Metoprolol Other (See Comments)    Exacerbates MS, High blood pressure  . Penicillins Other (See Comments)    Sores in mouth Has patient had a PCN reaction causing immediate rash, facial/tongue/throat swelling, SOB or lightheadedness with hypotension: No Has patient had a PCN reaction causing severe rash involving mucus membranes or skin necrosis: No Has  patient had a PCN reaction that required hospitalization: No Has patient had a PCN reaction occurring within the last 10 years: No If all of the above answers are "NO", then may proceed with Cephalosporin use.     Social History   Socioeconomic History  . Marital status: Divorced    Spouse name: Not on file  . Number of children: Not on file  . Years of education: Not on file  . Highest education level: Not on file  Social Needs  . Financial resource strain: Not on file  . Food insecurity - worry: Not on file  . Food insecurity - inability: Not on file  . Transportation needs - medical: Not on file  . Transportation needs - non-medical: Not on file  Occupational History  . Not on file  Tobacco Use  . Smoking status:  Never Smoker  . Smokeless tobacco: Current User  . Tobacco comment: rarely uses smokeless tobacco  Substance and Sexual Activity  . Alcohol use: Yes    Comment: maybe a 6 pack per week  . Drug use: No  . Sexual activity: Not on file  Other Topics Concern  . Not on file  Social History Narrative  . Not on file     Family History  Problem Relation Age of Onset  . Cancer Mother      Review of Systems Positive for chest pain shortness of breath burning Negative for: General:  chills, fever, night sweats or weight changes.  Cardiovascular: PND orthopnea syncope dizziness  Dermatological skin lesions rashes Respiratory: Cough congestion Urologic: Frequent urination urination at night and hematuria Abdominal: negative for nausea, vomiting, diarrhea, bright red blood per rectum, melena, or hematemesis Neurologic: negative for visual changes, and/or hearing changes  All other systems reviewed and are otherwise negative except as noted above.  Labs: Recent Labs    06/05/17 0517 06/05/17 1243 06/05/17 1608  TROPONINI 0.07* 0.22* 0.38*   Lab Results  Component Value Date   WBC 4.9 06/06/2017   HGB 9.2 (L) 06/06/2017   HCT 27.9 (L) 06/06/2017   MCV  92.5 06/06/2017   PLT 219 06/06/2017    Recent Labs  Lab 06/06/17 0600  NA 139  K 3.5  CL 109  CO2 24  BUN 14  CREATININE 1.20  CALCIUM 8.4*  GLUCOSE 108*   No results found for: CHOL, HDL, LDLCALC, TRIG No results found for: DDIMER  Radiology/Studies:  Ct Head Wo Contrast  Result Date: 06/05/2017 CLINICAL DATA:  Fall without loss of consciousness.  Dizziness. EXAM: CT HEAD WITHOUT CONTRAST TECHNIQUE: Contiguous axial images were obtained from the base of the skull through the vertex without intravenous contrast. COMPARISON:  February 23, 2017 FINDINGS: Brain: No subdural, epidural, or subarachnoid hemorrhage. Cerebellum, brainstem, and basal cisterns are unremarkable. Ventricles and sulci are prominent but stable. Mild white matter changes are stable. No acute cortical ischemia or infarct identified. No mass, mass effect, or midline shift. Vascular: Atherosclerotic vascular calcification of the carotid siphons. Skull: A craniotomy defect on the left is stable. Sinuses/Orbits: No acute finding. Other: None. IMPRESSION: No acute intracranial abnormalities. Electronically Signed   By: Gerome Sam III M.D   On: 06/05/2017 07:11   Ct Cervical Spine Wo Contrast  Result Date: 06/05/2017 CLINICAL DATA:  Neck pain, fall EXAM: CT CERVICAL SPINE WITHOUT CONTRAST TECHNIQUE: Multidetector CT imaging of the cervical spine was performed without intravenous contrast. Multiplanar CT image reconstructions were also generated. COMPARISON:  06/05/2017 FINDINGS: Alignment: Normal Skull base and vertebrae: No fracture Soft tissues and spinal canal: Prevertebral soft tissues are normal. No epidural or paraspinal hematoma. Disc levels: Anterior degenerative spurring at C4-5 and C5-6 and in the upper thoracic spine. Upper chest: No acute findings Other: No acute findings carotid artery calcifications. IMPRESSION: No acute bony abnormality. Electronically Signed   By: Charlett Nose M.D.   On: 06/05/2017 09:57    Dg Chest Port 1 View  Result Date: 06/05/2017 CLINICAL DATA:  Chest pressure since this afternoon. EXAM: PORTABLE CHEST 1 VIEW COMPARISON:  None. FINDINGS: Normal sized heart. Tortuous aorta. Post CABG changes. Clear lungs with normal vascularity. Unremarkable bones. IMPRESSION: No acute abnormality. Electronically Signed   By: Beckie Salts M.D.   On: 06/05/2017 16:20    EKG: Sinus tachycardia with left axis deviation  Weights: Filed Weights   06/05/17 0519  Weight:  101.2 kg (223 lb)     Physical Exam: Blood pressure (!) 109/58, pulse 95, temperature 98 F (36.7 C), temperature source Oral, resp. rate 18, height 6' (1.829 m), weight 101.2 kg (223 lb), SpO2 100 %. Body mass index is 30.24 kg/m. General: Well developed, well nourished, in no acute distress. Head eyes ears nose throat: Normocephalic, atraumatic, sclera non-icteric, no xanthomas, nares are without discharge. No apparent thyromegaly and/or mass  Lungs: Normal respiratory effort.  no wheezes, no rales, no rhonchi.  Heart: RRR with normal S1 S2. no murmur gallop, no rub, PMI is normal size and placement, carotid upstroke normal without bruit, jugular venous pressure is normal Abdomen: Soft, non-tender, non-distended with normoactive bowel sounds. No hepatomegaly. No rebound/guarding. No obvious abdominal masses. Abdominal aorta is normal size without bruit Extremities: No edema. no cyanosis, no clubbing, no ulcers  Peripheral : 2+ bilateral upper extremity pulses, 2+ bilateral femoral pulses, 2+ bilateral dorsal pedal pulse Neuro: Alert and oriented. No facial asymmetry. No focal deficit. Moves all extremities spontaneously. Musculoskeletal: Normal muscle tone without kyphosis Psych:  Responds to questions appropriately with a normal affect.    Assessment: 52 year old male with known coronary disease status post coronary artery bypass grafting having acute non-ST elevation myocardial infarction without evidence of  congestive heart failure  Plan: 1.  Continue supportive care for acute renal failure and dehydration 2.  Continue heparin and dual antiplatelet therapy for non-ST elevation myocardial infarction 3.  Nitrates for chest discomfort 4.  High intensity cholesterol therapy 5.  Echocardiogram for LV systolic dysfunction valvular heart disease contributing to above 6.  Proceed to cardiac catheterization to assess coronary anatomy and further treatment thereof is necessary.  Patient understands risk and benefits of cardiac catheterization.  This includes a possibility of death stroke heart attack infection bleeding or blood clot.  Patient is at low risk for conscious sedation  Signed, Lamar Blinks M.D. [facility] Card[facility] 06/06/2017, 7:38 AM

## 2017-06-06 NOTE — Progress Notes (Signed)
[employer] - [place] at [facility]                                                                                                                                                                                  Patient Demographics   Cody Burns, is a 52 y.o. male, DOB - 1965-11-17, ZOX:[id number]  Admit date - 06/05/2017   Admitting Physician Auburn Bilberry, MD  Outpatient Primary MD for the [facility] is [facility]   LOS - 1  Subjective: Patient's chest pain now resolved he states that he is feeling better breathing improved as well    Review of Systems:   CONSTITUTIONAL: No documented fever. No fatigue, weakness. No weight gain, no weight loss.  EYES: No blurry or double vision.  ENT: No tinnitus. No postnasal drip. No redness of the oropharynx.  RESPIRATORY: No cough, no wheeze, no hemoptysis.  Positive dyspnea.  CARDIOVASCULAR: Positive chest pain. No orthopnea. No palpitations. No syncope.  GASTROINTESTINAL: No nausea, no vomiting or diarrhea. No abdominal pain. No melena or hematochezia.  GENITOURINARY: No dysuria or hematuria.  ENDOCRINE: No polyuria or nocturia. No heat or cold intolerance.  HEMATOLOGY: No anemia. No bruising. No bleeding.  INTEGUMENTARY: No rashes. No lesions.  MUSCULOSKELETAL: No arthritis. No swelling. No gout.  NEUROLOGIC: No numbness, tingling, or ataxia. No seizure-type activity.  PSYCHIATRIC: No anxiety. No insomnia. No ADD.    Vitals:   Vitals:   06/06/17 0206 06/06/17 0214 06/06/17 0420 06/06/17 0818  BP: (!) 151/90 (!) 149/83 (!) 109/58 (!) 143/75  Pulse: 96 95 95 95  Resp: 18  18 14   Temp:   98 F (36.7 C) 98.2 F (36.8 C)  TempSrc:   Oral Oral  SpO2: 98% 99% 100% 100%  Weight:      Height:        Wt Readings from Last 3 Encounters:  06/05/17 223 lb (101.2 kg)  02/23/17 230 lb 9.6 oz (104.6 kg)  09/22/16 238 lb (108 kg)     Intake/Output Summary (Last 24 hours) at 06/06/2017 1403 Last data  filed at 06/06/2017 1330 Gross per 24 hour  Intake 4135.96 ml  Output 3875 ml  Net 260.96 ml    Physical Exam:   GENERAL: Pleasant-appearing in no apparent distress.  HEAD, EYES, EARS, NOSE AND THROAT: Atraumatic, normocephalic. Extraocular muscles are intact. Pupils equal and reactive to light. Sclerae anicteric. No conjunctival injection. No oro-pharyngeal erythema.  NECK: Supple. There is no jugular venous distention. No bruits, no lymphadenopathy, no thyromegaly.  HEART: Regular rate and rhythm,. No murmurs, no rubs, no clicks.  LUNGS: Clear to auscultation bilaterally. No rales or rhonchi. No wheezes.  ABDOMEN: Soft, flat, nontender, nondistended. Has good bowel sounds. No hepatosplenomegaly appreciated.  EXTREMITIES: No evidence of any cyanosis, clubbing, or peripheral edema.  +2 pedal and radial pulses bilaterally.  NEUROLOGIC: The patient is alert, awake, and oriented x3 with no focal motor or sensory deficits appreciated bilaterally.  SKIN: Moist and warm with no rashes appreciated.  Psych: Not anxious, depressed LN: No inguinal LN enlargement    Antibiotics   Anti-infectives (From admission, onward)   None      Medications   Scheduled Meds: . aspirin  325 mg Oral Daily  . atorvastatin  80 mg Oral QHS  . buPROPion  300 mg Oral Daily  . calcium-vitamin D  1 tablet Oral Daily  . clopidogrel  75 mg Oral Daily  . cyclobenzaprine  10 mg Oral TID  . Dimethyl Fumarate  240 mg Oral BID  . escitalopram  20 mg Oral Daily  . folic acid  1 mg Oral Daily  . gabapentin  900 mg Oral TID  . levothyroxine  25 mcg Oral QAC breakfast  . montelukast  10 mg Oral Daily  . niacin  500 mg Oral QHS  . QUEtiapine  100 mg Oral QHS  . senna-docusate  2 tablet Oral BID  . sodium chloride flush  3 mL Intravenous Q12H  . cyanocobalamin  1,000 mcg Oral Daily   Continuous Infusions: . sodium chloride 125 mL/hr at 06/06/17 1108  . heparin 1,200 Units/hr (06/06/17 1109)  . nitroGLYCERIN 5  mcg/min (06/06/17 0206)   PRN Meds:.acetaminophen **OR** acetaminophen, morphine injection, ondansetron **OR** ondansetron (ZOFRAN) IV, oxyCODONE, traMADol   Data Review:   Micro Results No results found for this or any previous visit (from the past 240 hour(s)).  Radiology Reports Ct Head Wo Contrast  Result Date: 06/05/2017 CLINICAL DATA:  Fall without loss of consciousness.  Dizziness. EXAM: CT HEAD WITHOUT CONTRAST TECHNIQUE: Contiguous axial images were obtained from the base of the skull through the vertex without intravenous contrast. COMPARISON:  February 23, 2017 FINDINGS: Brain: No subdural, epidural, or subarachnoid hemorrhage. Cerebellum, brainstem, and basal cisterns are unremarkable. Ventricles and sulci are prominent but stable. Mild white matter changes are stable. No acute cortical ischemia or infarct identified. No mass, mass effect, or midline shift. Vascular: Atherosclerotic vascular calcification of the carotid siphons. Skull: A craniotomy defect on the left is stable. Sinuses/Orbits: No acute finding. Other: None. IMPRESSION: No acute intracranial abnormalities. Electronically Signed   By: Gerome Sam III M.D   On: 06/05/2017 07:11   Ct Cervical Spine Wo Contrast  Result Date: 06/05/2017 CLINICAL DATA:  Neck pain, fall EXAM: CT CERVICAL SPINE WITHOUT CONTRAST TECHNIQUE: Multidetector CT imaging of the cervical spine was performed without intravenous contrast. Multiplanar CT image reconstructions were also generated. COMPARISON:  06/05/2017 FINDINGS: Alignment: Normal Skull base and vertebrae: No fracture Soft tissues and spinal canal: Prevertebral soft tissues are normal. No epidural or paraspinal hematoma. Disc levels: Anterior degenerative spurring at C4-5 and C5-6 and in the upper thoracic spine. Upper chest: No acute findings Other: No acute findings carotid artery calcifications. IMPRESSION: No acute bony abnormality. Electronically Signed   By: Charlett Nose M.D.   On:  06/05/2017 09:57   Dg Chest Port 1 View  Result Date: 06/05/2017 CLINICAL DATA:  Chest pressure since this afternoon. EXAM: PORTABLE CHEST 1 VIEW COMPARISON:  None. FINDINGS: Normal sized heart. Tortuous aorta. Post CABG changes. Clear lungs with normal vascularity. Unremarkable bones. IMPRESSION: No acute abnormality. Electronically Signed   By: Viviann Spare  Azucena Kuba M.D.   On: 06/05/2017 16:20     CBC Recent Labs  Lab 06/05/17 0517 06/06/17 0600  WBC 9.9 4.9  HGB 11.9* 9.2*  HCT 35.2* 27.9*  PLT 359 219  MCV 92.2 92.5  MCH 31.1 30.6  MCHC 33.7 33.1  RDW 14.6* 14.6*    Chemistries  Recent Labs  Lab 06/05/17 0517 06/06/17 0600  NA 136 139  K 3.9 3.5  CL 104 109  CO2 22 24  GLUCOSE 147* 108*  BUN 24* 14  CREATININE 2.37* 1.20  CALCIUM 9.0 8.4*  MG 2.1  --    ------------------------------------------------------------------------------------------------------------------ estimated creatinine clearance is 87.6 mL/min (by C-G formula based on SCr of 1.2 mg/dL). ------------------------------------------------------------------------------------------------------------------ No results for input(s): HGBA1C in the last 72 hours. ------------------------------------------------------------------------------------------------------------------ No results for input(s): CHOL, HDL, LDLCALC, TRIG, CHOLHDL, LDLDIRECT in the last 72 hours. ------------------------------------------------------------------------------------------------------------------ Recent Labs    06/05/17 1243  TSH 1.215   ------------------------------------------------------------------------------------------------------------------ No results for input(s): VITAMINB12, FOLATE, FERRITIN, TIBC, IRON, RETICCTPCT in the last 72 hours.  Coagulation profile Recent Labs  Lab 06/05/17 1608  INR 0.80    No results for input(s): DDIMER in the last 72 hours.  Cardiac Enzymes Recent Labs  Lab 06/05/17 1243  06/05/17 1608 06/06/17 0600  TROPONINI 0.22* 0.38* 1.21*   ------------------------------------------------------------------------------------------------------------------ Invalid input(s): POCBNP    Assessment & Plan   Patient is 52 year old admitted with acute renal failure now with chest pain  1.  Acute non-ST MI  Continue heparin drip Continue nitroglycerin drip Continue aspirin You use as needed morphine Cardiac cath tomorrow  2.  Acute renal failure resolved with IV hydration 3.  Hypotension due to dehydration now resolved 4.  Hyperlipidemia continue Lipitor 5.  Hypothyroidism continue Synthroid     Code Status Orders  (From admission, onward)        Start     Ordered   06/05/17 1012  Full code  Continuous     06/05/17 1011    Code Status History    Date Active Date Inactive Code Status Order ID Comments User Context   02/23/2017 19:08 02/25/2017 19:49 Full Code [id number]  Adrian Saran, MD Inpatient   09/22/2016 02:52 09/22/2016 17:58 Full Code [id number]  PoggiExcell Seltzer, MD Inpatient    Advance Directive Documentation     Most Recent Value  Type of Advance Directive  Healthcare Power of Attorney  Pre-existing out of facility DNR order (yellow form or pink MOST form)  No data  "MOST" Form in Place?  No data           Consults  cards   DVT Prophylaxis heparin  Lab Results  Component Value Date   PLT 219 06/06/2017     Time Spent in minutes   Greater than 50% of time spent in care coordination and counseling patient regarding the condition and plan of care.   Auburn Bilberry M.D on 06/06/2017 at 2:03 PM  Between 7am to 6pm - Pager - [phone]  After 6pm go to [email or web address] - password [email or web address] [facility]  [facility] [place] [facility]   Office  [phone]

## 2017-06-07 ENCOUNTER — Inpatient Hospital Stay: Payer: Medicare Other

## 2017-06-07 ENCOUNTER — Encounter
Admission: EM | Disposition: A | Discharge status: Skilled Nursing Facility | Payer: Self-pay | Source: Home / Self Care | Attending: Internal Medicine

## 2017-06-07 HISTORY — PX: CORONARY STENT INTERVENTION: CATH118234

## 2017-06-07 HISTORY — PX: LEFT HEART CATH AND CORS/GRAFTS ANGIOGRAPHY: CATH118250

## 2017-06-07 LAB — LIPID PANEL
CHOLESTEROL: 133 mg/dL (ref 0–200)
HDL: 54 mg/dL (ref >40)
LDL Cholesterol: 55 mg/dL (ref 0–99)
TRIGLYCERIDES: 119 mg/dL (ref <150)
Total CHOL/HDL Ratio: 2.5 RATIO
VLDL: 24 mg/dL (ref 0–40)

## 2017-06-07 LAB — CBC
HCT: 24.5 % — ABNORMAL LOW (ref 40.0–52.0)
Hemoglobin: 8.3 g/dL — ABNORMAL LOW (ref 13.0–18.0)
MCH: 31.1 pg (ref 26.0–34.0)
MCHC: 34.1 g/dL (ref 32.0–36.0)
MCV: 91.3 fL (ref 80.0–100.0)
PLATELETS: 248 10*3/uL (ref 150–440)
RBC: 2.68 MIL/uL — AB (ref 4.40–5.90)
RDW: 14.3 % (ref 11.5–14.5)
WBC: 6.7 10*3/uL (ref 3.8–10.6)

## 2017-06-07 LAB — BASIC METABOLIC PANEL
Anion gap: 9 (ref 5–15)
BUN: 11 mg/dL (ref 6–20)
CHLORIDE: 101 mmol/L (ref 101–111)
CO2: 25 mmol/L (ref 22–32)
CREATININE: 1.23 mg/dL (ref 0.61–1.24)
Calcium: 8.7 mg/dL — ABNORMAL LOW (ref 8.9–10.3)
GFR calc non Af Amer: >60 mL/min (ref >60)
Glucose, Bld: 104 mg/dL — ABNORMAL HIGH (ref 65–99)
POTASSIUM: 3.5 mmol/L (ref 3.5–5.1)
SODIUM: 135 mmol/L (ref 135–145)

## 2017-06-07 LAB — HEPARIN LEVEL (UNFRACTIONATED): Heparin Unfractionated: 0.32 IU/mL (ref 0.30–0.70)

## 2017-06-07 LAB — POCT ACTIVATED CLOTTING TIME: Activated Clotting Time: 340 seconds

## 2017-06-07 SURGERY — LEFT HEART CATH AND CORS/GRAFTS ANGIOGRAPHY
Anesthesia: Moderate Sedation

## 2017-06-07 MED ORDER — SODIUM CHLORIDE 0.9 % WEIGHT BASED INFUSION: 1.0000 mL/kg/h | INTRAVENOUS | Status: AC

## 2017-06-07 MED ORDER — AMLODIPINE BESYLATE 5 MG PO TABS
5.0000 mg | ORAL_TABLET | Freq: Every day | ORAL | Status: DC
  Administered 2017-06-07 – 2017-06-09 (×3): 5 mg via ORAL
  Filled 2017-06-07 (×3): qty 1

## 2017-06-07 MED ORDER — HYDRALAZINE HCL 20 MG/ML IJ SOLN: 5.0000 mg | INTRAMUSCULAR | Status: AC | PRN

## 2017-06-07 MED ORDER — SODIUM CHLORIDE 0.9% FLUSH: 3.0000 mL | INTRAVENOUS | Status: DC | PRN

## 2017-06-07 MED ORDER — CLOPIDOGREL BISULFATE 75 MG PO TABS
ORAL_TABLET | ORAL | Status: DC | PRN
  Administered 2017-06-07: 225 mg via ORAL

## 2017-06-07 MED ORDER — BIVALIRUDIN BOLUS VIA INFUSION - CUPID
INTRAVENOUS | Status: DC | PRN
  Administered 2017-06-07: 78.9 mg via INTRAVENOUS

## 2017-06-07 MED ORDER — SODIUM CHLORIDE 0.9% FLUSH
3.0000 mL | Freq: Two times a day (BID) | INTRAVENOUS | Status: DC
  Administered 2017-06-08 – 2017-06-11 (×7): 3 mL via INTRAVENOUS

## 2017-06-07 MED ORDER — HEPARIN (PORCINE) IN NACL 2-0.9 UNIT/ML-% IJ SOLN
INTRAMUSCULAR | Status: AC
  Filled 2017-06-07: qty 500

## 2017-06-07 MED ORDER — IOPAMIDOL (ISOVUE-300) INJECTION 61%
INTRAVENOUS | Status: DC | PRN
  Administered 2017-06-07: 195 mL via INTRA_ARTERIAL

## 2017-06-07 MED ORDER — ACETAMINOPHEN 325 MG PO TABS: 650.0000 mg | ORAL_TABLET | ORAL | Status: DC | PRN

## 2017-06-07 MED ORDER — ASPIRIN 81 MG PO CHEW: CHEWABLE_TABLET | ORAL | Status: DC | PRN

## 2017-06-07 MED ORDER — METOPROLOL TARTRATE 50 MG PO TABS: 50.0000 mg | ORAL_TABLET | Freq: Two times a day (BID) | ORAL | Status: DC

## 2017-06-07 MED ORDER — FENTANYL CITRATE (PF) 100 MCG/2ML IJ SOLN
INTRAMUSCULAR | Status: AC
  Filled 2017-06-07: qty 2

## 2017-06-07 MED ORDER — SODIUM CHLORIDE 0.9% FLUSH: 3.0000 mL | Freq: Two times a day (BID) | INTRAVENOUS | Status: DC

## 2017-06-07 MED ORDER — MIDAZOLAM HCL 2 MG/2ML IJ SOLN
INTRAMUSCULAR | Status: AC
  Filled 2017-06-07: qty 2

## 2017-06-07 MED ORDER — CLOPIDOGREL BISULFATE 75 MG PO TABS: 75.0000 mg | ORAL_TABLET | Freq: Every day | ORAL | Status: DC

## 2017-06-07 MED ORDER — BIVALIRUDIN TRIFLUOROACETATE 250 MG IV SOLR
INTRAVENOUS | Status: AC
  Filled 2017-06-07: qty 250

## 2017-06-07 MED ORDER — ASPIRIN 81 MG PO CHEW: 81.0000 mg | CHEWABLE_TABLET | Freq: Every day | ORAL | Status: DC

## 2017-06-07 MED ORDER — LABETALOL HCL 5 MG/ML IV SOLN
INTRAVENOUS | Status: DC | PRN
  Administered 2017-06-07: 20 mg via INTRAVENOUS

## 2017-06-07 MED ORDER — FENTANYL CITRATE (PF) 100 MCG/2ML IJ SOLN
INTRAMUSCULAR | Status: DC | PRN
  Administered 2017-06-07: 50 ug via INTRAVENOUS
  Administered 2017-06-07: 25 ug via INTRAVENOUS

## 2017-06-07 MED ORDER — SODIUM CHLORIDE 0.9% FLUSH
3.0000 mL | INTRAVENOUS | Status: DC | PRN
Start: 2017-06-07 — End: 2017-06-07

## 2017-06-07 MED ORDER — SODIUM CHLORIDE 0.9 % WEIGHT BASED INFUSION
3.0000 mL/kg/h | INTRAVENOUS | Status: DC
  Administered 2017-06-07: 3 mL/kg/h via INTRAVENOUS

## 2017-06-07 MED ORDER — MIDAZOLAM HCL 2 MG/2ML IJ SOLN
INTRAMUSCULAR | Status: DC | PRN
  Administered 2017-06-07: 0.5 mg via INTRAVENOUS

## 2017-06-07 MED ORDER — CLOPIDOGREL BISULFATE 75 MG PO TABS
ORAL_TABLET | ORAL | Status: AC
  Filled 2017-06-07: qty 3

## 2017-06-07 MED ORDER — ONDANSETRON HCL 4 MG/2ML IJ SOLN: 4.0000 mg | Freq: Four times a day (QID) | INTRAMUSCULAR | Status: DC | PRN

## 2017-06-07 MED ORDER — LABETALOL HCL 5 MG/ML IV SOLN
INTRAVENOUS | Status: AC
  Filled 2017-06-07: qty 4

## 2017-06-07 MED ORDER — IOPAMIDOL (ISOVUE-300) INJECTION 61%
INTRAVENOUS | Status: DC | PRN
  Administered 2017-06-07: 150 mL via INTRA_ARTERIAL

## 2017-06-07 MED ORDER — NITROGLYCERIN 5 MG/ML IV SOLN
INTRAVENOUS | Status: AC
  Filled 2017-06-07: qty 10

## 2017-06-07 MED ORDER — ASPIRIN 81 MG PO CHEW
CHEWABLE_TABLET | ORAL | Status: DC | PRN
  Administered 2017-06-07: 243 mg via ORAL

## 2017-06-07 MED ORDER — SODIUM CHLORIDE 0.9 % WEIGHT BASED INFUSION
1.0000 mL/kg/h | INTRAVENOUS | Status: DC
  Administered 2017-06-07: 1 mL/kg/h via INTRAVENOUS

## 2017-06-07 MED ORDER — ASPIRIN 81 MG PO CHEW
CHEWABLE_TABLET | ORAL | Status: AC
  Filled 2017-06-07: qty 3

## 2017-06-07 MED ORDER — SODIUM CHLORIDE 0.9 % IV SOLN: 250.0000 mL | INTRAVENOUS | Status: DC | PRN

## 2017-06-07 MED ORDER — LABETALOL HCL 5 MG/ML IV SOLN: 10.0000 mg | INTRAVENOUS | Status: AC | PRN

## 2017-06-07 MED ORDER — MIDAZOLAM HCL 2 MG/2ML IJ SOLN
INTRAMUSCULAR | Status: DC | PRN
  Administered 2017-06-07: 0.5 mg via INTRAVENOUS
  Administered 2017-06-07: 1 mg via INTRAVENOUS

## 2017-06-07 MED ORDER — FENTANYL CITRATE (PF) 100 MCG/2ML IJ SOLN
INTRAMUSCULAR | Status: DC | PRN
  Administered 2017-06-07: 25 ug via INTRAVENOUS

## 2017-06-07 MED ORDER — LISINOPRIL 10 MG PO TABS
20.0000 mg | ORAL_TABLET | Freq: Every day | ORAL | Status: DC
  Administered 2017-06-07 – 2017-06-09 (×3): 20 mg via ORAL
  Filled 2017-06-07 (×4): qty 2

## 2017-06-07 SURGICAL SUPPLY — 22 items
BALLN TREK RX 2.5X20 (BALLOONS) ×3
BALLN ~~LOC~~ TREK RX 3.5X20 (BALLOONS) ×3
BALLN ~~LOC~~ TREK RX 5.0X12 (BALLOONS) ×3
BALLOON TREK RX 2.5X20 (BALLOONS) ×1 IMPLANT
BALLOON ~~LOC~~ TREK RX 3.5X20 (BALLOONS) ×1 IMPLANT
BALLOON ~~LOC~~ TREK RX 5.0X12 (BALLOONS) ×1 IMPLANT
CATH G 6X70X100 MPA SH (CATHETERS) ×3 IMPLANT
CATH INFINITI 5FR ANG PIGTAIL (CATHETERS) ×3 IMPLANT
CATH INFINITI 5FR JL4 (CATHETERS) ×3 IMPLANT
CATH INFINITI JR4 5F (CATHETERS) ×3 IMPLANT
CATHETER LAUNCHER 6FR JR4 SH (CATHETERS) ×3 IMPLANT
DEVICE CLOSURE MYNXGRIP 6/7F (Vascular Products) ×3 IMPLANT
DEVICE INFLAT 30 PLUS (MISCELLANEOUS) ×3 IMPLANT
KIT MANI 3VAL PERCEP (MISCELLANEOUS) ×3 IMPLANT
NEEDLE PERC 18GX7CM (NEEDLE) ×3 IMPLANT
PACK CARDIAC CATH (CUSTOM PROCEDURE TRAY) ×3 IMPLANT
SHEATH AVANTI 6FR X 11CM (SHEATH) ×3 IMPLANT
SHEATH PINNACLE 5F 10CM (SHEATH) ×3 IMPLANT
STENT SIERRA 3.00 X 23 MM (Permanent Stent) ×3 IMPLANT
STENT SIERRA 4.00 X 18 MM (Permanent Stent) ×3 IMPLANT
WIRE EMERALD 3MM-J .035X150CM (WIRE) ×3 IMPLANT
WIRE G HI TQ BMW 190 (WIRE) ×3 IMPLANT

## 2017-06-07 NOTE — Progress Notes (Signed)
ANTICOAGULATION CONSULT NOTE   Pharmacy Consult for heparin  Indication: chest pain/ACS  Allergies  Allergen Reactions  . Hydrochlorothiazide W-Triamterene Other (See Comments)    High blood pressure  . Metoprolol Other (See Comments)    Exacerbates MS, High blood pressure  . Penicillins Other (See Comments)    Sores in mouth Has patient had a PCN reaction causing immediate rash, facial/tongue/throat swelling, SOB or lightheadedness with hypotension: No Has patient had a PCN reaction causing severe rash involving mucus membranes or skin necrosis: No Has patient had a PCN reaction that required hospitalization: No Has patient had a PCN reaction occurring within the last 10 years: No If all of the above answers are "NO", then may proceed with Cephalosporin use.     Patient Measurements: Height: 6' (182.9 cm) Weight: 232 lb (105.2 kg) IBW/kg (Calculated) : 77.6 Heparin Dosing Weight: 98.2 kg  Vital Signs: Temp: 98.3 F (36.8 C) (01/14 0715) Temp Source: Oral (01/14 0715) BP: 125/58 (01/14 0715) Pulse Rate: 100 (01/14 0715)  Labs: Recent Labs    06/05/17 0517 06/05/17 1243 06/05/17 1608 06/05/17 2130 06/06/17 0600 06/07/17 0609  HGB 11.9*  --   --   --  9.2* 8.3*  HCT 35.2*  --   --   --  27.9* 24.5*  PLT 359  --   --   --  219 248  APTT  --   --  24  --   --   --   LABPROT  --   --  11.0*  --   --   --   INR  --   --  0.80  --   --   --   HEPARINUNFRC  --   --   --  0.40 0.61 0.32  CREATININE 2.37*  --   --   --  1.20 1.23  TROPONINI 0.07* 0.22* 0.38*  --  1.21*  --     Estimated Creatinine Clearance: 87 mL/min (by C-G formula based on SCr of 1.23 mg/dL).   Medical History: Past Medical History:  Diagnosis Date  . 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 cause of his ankle break. awaiting neuro workup  . Hypertension   . Hypothyroidism   . Multiple sclerosis (HCC) 1990  . Myocardial infarct (HCC)   . Optic neuritis    x 3. takes steriods  and it resolves.  . Seizures (HCC)    as a child. diagnosed with MS and once treated, seizures resolved.  . Sleep apnea     Medications:  Patient takes clopidogrel and aspirin at home and is not on any other blood thinners.   Assessment: 52 yo male presents dizziness and a fall. Once admitted he began complaining of increasing chest pressure and an EKG was obtained. Pharmacy was consulted to dose and monitor heparin drip.   Patient had previously received one dose SQ heparin 5000 at 1446. Will be expecting baseline APTT to be slightly elevated. Additionally will give lower bolus of 2000 units as patient received SQ heparin an hour previous to drip.     HL 0.40 > 0.61 > 0.42   Goal of Therapy:  Heparin level 0.3-0.7 units/ml Monitor platelets by anticoagulation protocol: Yes   Plan:  Give 2000 units bolus x 1 Start heparin infusion at 1200 units/hr Check anti-Xa level in 6 hours and daily while on heparin Continue to monitor H&H and platelets  1/14 Patient has now had two therapeutic heparin levels will continue current  rate of heparin iv 1200 units/hr. Will recheck HL with am labs and cbc per protocol.   Luan Pulling, PharmD, MBA, [employer] Clinical Pharmacist [facility], [place] 06/07/2017,8:13 AM

## 2017-06-07 NOTE — Progress Notes (Signed)
All pre cath orders completed, bilateral groin prepped, consent signed and placed on chart, Pt. NPO since midnight.

## 2017-06-07 NOTE — Progress Notes (Signed)
[facility] Cardiology [facility] Encounter Note  Patient: Cody Burns / Admit Date: 06/05/2017 / Date of Encounter: 06/07/2017, 12:27 PM   Subjective: No further chest pain overnight.  Medication management improving overall symptoms.  Heart rate and blood pressure elevated today.  Elevated troponin of 1.2 consistent with non-ST elevation myocardial infarction Cardiac catheterization shows mild anterior apical hypokinesis with a ejection fraction of 45% Critical native three-vessel coronary artery disease LIMA to LAD patent Graft to obtuse marginal 1 occluded Graft to diagonal 1 with 95% stenosis Graft to PDA with 90% stenosis  Review of Systems: Positive for: Chest pain shortness of breath Negative for: Vision change, hearing change, syncope, dizziness, nausea, vomiting,diarrhea, bloody stool, stomach pain, cough, congestion, diaphoresis, urinary frequency, urinary pain,skin lesions, skin rashes Others previously listed  Objective: Telemetry: Sinus tachycardia Physical Exam: Blood pressure (!) 145/84, pulse (!) 110, temperature 99.6 F (37.6 C), temperature source Oral, resp. rate 14, height 6' (1.829 m), weight 105.2 kg (232 lb), SpO2 100 %. Body mass index is 31.46 kg/m. General: Well developed, well nourished, in no acute distress. Head: Normocephalic, atraumatic, sclera non-icteric, no xanthomas, nares are without discharge. Neck: No apparent masses Lungs: Normal respirations with no wheezes, no rhonchi, no rales , no crackles   Heart: Regular rate and rhythm, normal S1 S2, no murmur, no rub, no gallop, PMI is normal size and placement, carotid upstroke normal without bruit, jugular venous pressure normal Abdomen: Soft, non-tender, non-distended with normoactive bowel sounds. No hepatosplenomegaly. Abdominal aorta is normal size without bruit Extremities: No edema, no clubbing, no cyanosis, no ulcers,  Peripheral: 2+ radial, 2+ femoral, 2+ dorsal pedal pulses Neuro: Alert and  oriented. Moves all extremities spontaneously. Psych:  Responds to questions appropriately with a normal affect.   Intake/Output Summary (Last 24 hours) at 06/07/2017 1227 Last data filed at 06/07/2017 0656 Gross per 24 hour  Intake 2245.6 ml  Output 3675 ml  Net -1429.4 ml    Inpatient Medications:  . [MAR Hold] aspirin  325 mg Oral Daily  . [MAR Hold] atorvastatin  80 mg Oral QHS  . [MAR Hold] buPROPion  300 mg Oral Daily  . [MAR Hold] calcium-vitamin D  1 tablet Oral Daily  . [MAR Hold] clopidogrel  75 mg Oral Daily  . [MAR Hold] cyclobenzaprine  10 mg Oral TID  . [MAR Hold] Dimethyl Fumarate  240 mg Oral BID  . [MAR Hold] escitalopram  20 mg Oral Daily  . [MAR Hold] folic acid  1 mg Oral Daily  . [MAR Hold] gabapentin  900 mg Oral TID  . [MAR Hold] levothyroxine  25 mcg Oral QAC breakfast  . [MAR Hold] montelukast  10 mg Oral Daily  . [MAR Hold] niacin  500 mg Oral QHS  . [MAR Hold] QUEtiapine  100 mg Oral QHS  . [MAR Hold] senna-docusate  2 tablet Oral BID  . [MAR Hold] sodium chloride flush  3 mL Intravenous Q12H  . sodium chloride flush  3 mL Intravenous Q12H  . [MAR Hold] cyanocobalamin  1,000 mcg Oral Daily   Infusions:  . sodium chloride Stopped (06/07/17 0557)  . sodium chloride    . [START ON 06/08/2017] sodium chloride 3 mL/kg/hr (06/07/17 0447)   Followed by  . [START ON 06/08/2017] sodium chloride 1 mL/kg/hr (06/07/17 0557)  . heparin Stopped (06/07/17 1124)  . [MAR Hold] nitroGLYCERIN 5 mcg/min (06/06/17 0206)    Labs: Recent Labs    06/05/17 0517 06/06/17 0600 06/07/17 0609  NA 136 139 135  K 3.9 3.5 3.5  CL 104 109 101  CO2 22 24 25   GLUCOSE 147* 108* 104*  BUN 24* 14 11  CREATININE 2.37* 1.20 1.23  CALCIUM 9.0 8.4* 8.7*  MG 2.1  --   --    No results for input(s): AST, ALT, ALKPHOS, BILITOT, PROT, ALBUMIN in the last 72 hours. Recent Labs    06/06/17 0600 06/07/17 0609  WBC 4.9 6.7  HGB 9.2* 8.3*  HCT 27.9* 24.5*  MCV 92.5 91.3  PLT  219 248   Recent Labs    06/05/17 0517 06/05/17 1243 06/05/17 1608 06/06/17 0600  TROPONINI 0.07* 0.22* 0.38* 1.21*   Invalid input(s): POCBNP No results for input(s): HGBA1C in the last 72 hours.   Weights: Filed Weights   06/05/17 0519 06/07/17 0343 06/07/17 1114  Weight: 101.2 kg (223 lb) 105.2 kg (232 lb) 105.2 kg (232 lb)     Radiology/Studies:  Ct Head Wo Contrast  Result Date: 06/05/2017 CLINICAL DATA:  Fall without loss of consciousness.  Dizziness. EXAM: CT HEAD WITHOUT CONTRAST TECHNIQUE: Contiguous axial images were obtained from the base of the skull through the vertex without intravenous contrast. COMPARISON:  February 23, 2017 FINDINGS: Brain: No subdural, epidural, or subarachnoid hemorrhage. Cerebellum, brainstem, and basal cisterns are unremarkable. Ventricles and sulci are prominent but stable. Mild white matter changes are stable. No acute cortical ischemia or infarct identified. No mass, mass effect, or midline shift. Vascular: Atherosclerotic vascular calcification of the carotid siphons. Skull: A craniotomy defect on the left is stable. Sinuses/Orbits: No acute finding. Other: None. IMPRESSION: No acute intracranial abnormalities. Electronically Signed   By: Gerome Sam III M.D   On: 06/05/2017 07:11   Ct Cervical Spine Wo Contrast  Result Date: 06/05/2017 CLINICAL DATA:  Neck pain, fall EXAM: CT CERVICAL SPINE WITHOUT CONTRAST TECHNIQUE: Multidetector CT imaging of the cervical spine was performed without intravenous contrast. Multiplanar CT image reconstructions were also generated. COMPARISON:  06/05/2017 FINDINGS: Alignment: Normal Skull base and vertebrae: No fracture Soft tissues and spinal canal: Prevertebral soft tissues are normal. No epidural or paraspinal hematoma. Disc levels: Anterior degenerative spurring at C4-5 and C5-6 and in the upper thoracic spine. Upper chest: No acute findings Other: No acute findings carotid artery calcifications. IMPRESSION:  No acute bony abnormality. Electronically Signed   By: Charlett Nose M.D.   On: 06/05/2017 09:57   Dg Chest Port 1 View  Result Date: 06/05/2017 CLINICAL DATA:  Chest pressure since this afternoon. EXAM: PORTABLE CHEST 1 VIEW COMPARISON:  None. FINDINGS: Normal sized heart. Tortuous aorta. Post CABG changes. Clear lungs with normal vascularity. Unremarkable bones. IMPRESSION: No acute abnormality. Electronically Signed   By: Beckie Salts M.D.   On: 06/05/2017 16:20   Dg Hip Unilat With Pelvis 2-3 Views Left  Result Date: 06/07/2017 CLINICAL DATA:  Left hip pain since a fall 1 week ago Initial encounter. EXAM: DG HIP (WITH OR WITHOUT PELVIS) 2-3V LEFT COMPARISON:  None. 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 The device is located. No fractures identified. Surrounding osseous and soft tissue structures appear normal. IMPRESSION: No acute abnormality. Left hip replacement in place without complicating feature. Electronically Signed   By: Drusilla Kanner M.D.   On: 06/07/2017 10:25     Assessment and Recommendation  52 y.o. male with known coronary artery disease essential hypertension mixed hyperlipidemia status post coronary artery bypass graft with chest discomfort and non-ST elevation myocardial infarction Cardiac catheterization showing mild LV systolic dysfunction with critical  native coronary atherosclerosis.  There is new significant stenosis of graft to diagonal artery and right coronary artery as culprit of shortness of breath and chest discomfort and non-ST elevation myocardial infarction 1.  Continue dual antiplatelet therapy for myocardial infarction and significant graft stenosis 2.  PCI and stent placement of right coronary artery and diagonal graft stenosis 3.  High intensity cholesterol therapy 4.  Reinstatement of essential hypertension treatment 5.  Cardiac rehabilitation with possible discharge home if ambulating well with appropriate medications listed as above  tomorrow Signed, Arnoldo Hooker M.D. FACC

## 2017-06-07 NOTE — Care Management (Signed)
Ruled in for nstemi.  For cardiac cath today

## 2017-06-07 NOTE — Progress Notes (Signed)
[facility] [place] [facility]                                                                                                                                                                                  Patient Demographics   Cody Burns, is a 52 y.o. male, DOB - Dec 14, 1965, ZOX:[id number]  Admit date - 06/05/2017   Admitting Physician Auburn Bilberry, MD  Outpatient Primary MD for the patient is [facility]   LOS - 2  Subjective: Pt had two stens placed, c/o pain in left leg  Review of Systems:   CONSTITUTIONAL: No documented fever. No fatigue, weakness. No weight gain, no weight loss.  EYES: No blurry or double vision.  ENT: No tinnitus. No postnasal drip. No redness of the oropharynx.  RESPIRATORY: No cough, no wheeze, no hemoptysis.  Positive dyspnea.  CARDIOVASCULAR: Positive chest pain. No orthopnea. No palpitations. No syncope.  GASTROINTESTINAL: No nausea, no vomiting or diarrhea. No abdominal pain. No melena or hematochezia.  GENITOURINARY: No dysuria or hematuria.  ENDOCRINE: No polyuria or nocturia. No heat or cold intolerance.  HEMATOLOGY: No anemia. No bruising. No bleeding.  INTEGUMENTARY: No rashes. No lesions.  MUSCULOSKELETAL: No arthritis. No swelling. No gout.  NEUROLOGIC: No numbness, tingling, or ataxia. No seizure-type activity.  PSYCHIATRIC: No anxiety. No insomnia. No ADD.    Vitals:   Vitals:   06/07/17 1500 06/07/17 1530 06/07/17 1600 06/07/17 1626  BP: 120/66 116/70 110/68 (!) 110/55  Pulse: 94  95 95  Resp: 18 20 19 15   Temp:      TempSrc:      SpO2: 94% 93% 97% 100%  Weight:      Height:        Wt Readings from Last 3 Encounters:  06/07/17 232 lb (105.2 kg)  02/23/17 230 lb 9.6 oz (104.6 kg)  09/22/16 238 lb (108 kg)     Intake/Output Summary (Last 24 hours) at 06/07/2017 1734 Last data filed at 06/07/2017 1500 Gross per 24 hour  Intake 2245.6 ml  Output 2975 ml  Net -729.4 ml    Physical  Exam:   GENERAL: Pleasant-appearing in no apparent distress.  HEAD, EYES, EARS, NOSE AND THROAT: Atraumatic, normocephalic. Extraocular muscles are intact. Pupils equal and reactive to light. Sclerae anicteric. No conjunctival injection. No oro-pharyngeal erythema.  NECK: Supple. There is no jugular venous distention. No bruits, no lymphadenopathy, no thyromegaly.  HEART: Regular rate and rhythm,. No murmurs, no rubs, no clicks.  LUNGS: Clear to auscultation bilaterally. No rales or rhonchi. No wheezes.  ABDOMEN: Soft, flat, nontender, nondistended. Has good bowel sounds. No hepatosplenomegaly appreciated.  EXTREMITIES: No evidence of  any cyanosis, clubbing, or peripheral edema.  +2 pedal and radial pulses bilaterally.  NEUROLOGIC: The patient is alert, awake, and oriented x3 with no focal motor or sensory deficits appreciated bilaterally.  SKIN: Moist and warm with no rashes appreciated.  Psych: Not anxious, depressed LN: No inguinal LN enlargement    Antibiotics   Anti-infectives (From admission, onward)   None      Medications   Scheduled Meds: . amLODipine  5 mg Oral Daily  . aspirin  325 mg Oral Daily  . atorvastatin  80 mg Oral QHS  . buPROPion  300 mg Oral Daily  . calcium-vitamin D  1 tablet Oral Daily  . clopidogrel  75 mg Oral Daily  . cyclobenzaprine  10 mg Oral TID  . Dimethyl Fumarate  240 mg Oral BID  . escitalopram  20 mg Oral Daily  . folic acid  1 mg Oral Daily  . gabapentin  900 mg Oral TID  . levothyroxine  25 mcg Oral QAC breakfast  . lisinopril  20 mg Oral Daily  . montelukast  10 mg Oral Daily  . niacin  500 mg Oral QHS  . QUEtiapine  100 mg Oral QHS  . senna-docusate  2 tablet Oral BID  . sodium chloride flush  3 mL Intravenous Q12H  . [START ON 06/08/2017] sodium chloride flush  3 mL Intravenous Q12H  . cyanocobalamin  1,000 mcg Oral Daily   Continuous Infusions: . sodium chloride Stopped (06/07/17 0557)  . [START ON 06/08/2017] sodium chloride     . sodium chloride 1 mL/kg/hr (06/07/17 1420)  . nitroGLYCERIN 5 mcg/min (06/06/17 0206)   PRN Meds:.[START ON 06/08/2017] sodium chloride, acetaminophen **OR** acetaminophen, hydrALAZINE, labetalol, morphine injection, ondansetron **OR** ondansetron (ZOFRAN) IV, oxyCODONE, [START ON 06/08/2017] sodium chloride flush, traMADol, trolamine salicylate   Data Review:   Micro Results No results found for this or any previous visit (from the past 240 hour(s)).  Radiology Reports Ct Head Wo Contrast  Result Date: 06/05/2017 CLINICAL DATA:  Fall without loss of consciousness.  Dizziness. EXAM: CT HEAD WITHOUT CONTRAST TECHNIQUE: Contiguous axial images were obtained from the base of the skull through the vertex without intravenous contrast. COMPARISON:  February 23, 2017 FINDINGS: Brain: No subdural, epidural, or subarachnoid hemorrhage. Cerebellum, brainstem, and basal cisterns are unremarkable. Ventricles and sulci are prominent but stable. Mild white matter changes are stable. No acute cortical ischemia or infarct identified. No mass, mass effect, or midline shift. Vascular: Atherosclerotic vascular calcification of the carotid siphons. Skull: A craniotomy defect on the left is stable. Sinuses/Orbits: No acute finding. Other: None. IMPRESSION: No acute intracranial abnormalities. Electronically Signed   By: Gerome Sam III M.D   On: 06/05/2017 07:11   Ct Cervical Spine Wo Contrast  Result Date: 06/05/2017 CLINICAL DATA:  Neck pain, fall EXAM: CT CERVICAL SPINE WITHOUT CONTRAST TECHNIQUE: Multidetector CT imaging of the cervical spine was performed without intravenous contrast. Multiplanar CT image reconstructions were also generated. COMPARISON:  06/05/2017 FINDINGS: Alignment: Normal Skull base and vertebrae: No fracture Soft tissues and spinal canal: Prevertebral soft tissues are normal. No epidural or paraspinal hematoma. Disc levels: Anterior degenerative spurring at C4-5 and C5-6 and in the upper  thoracic spine. Upper chest: No acute findings Other: No acute findings carotid artery calcifications. IMPRESSION: No acute bony abnormality. Electronically Signed   By: Charlett Nose M.D.   On: 06/05/2017 09:57   Dg Chest Port 1 View  Result Date: 06/05/2017 CLINICAL DATA:  Chest pressure since this  afternoon. EXAM: PORTABLE CHEST 1 VIEW COMPARISON:  None. FINDINGS: Normal sized heart. Tortuous aorta. Post CABG changes. Clear lungs with normal vascularity. Unremarkable bones. IMPRESSION: No acute abnormality. Electronically Signed   By: Beckie Salts M.D.   On: 06/05/2017 16:20   Dg Hip Unilat With Pelvis 2-3 Views Left  Result Date: 06/07/2017 CLINICAL DATA:  Left hip pain since a fall 1 week ago Initial encounter. EXAM: DG HIP (WITH OR WITHOUT PELVIS) 2-3V LEFT COMPARISON:  None. 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 The device is located. No fractures identified. Surrounding osseous and soft tissue structures appear normal. IMPRESSION: No acute abnormality. Left hip replacement in place without complicating feature. Electronically Signed   By: Drusilla Kanner M.D.   On: 06/07/2017 10:25     CBC Recent Labs  Lab 06/05/17 0517 06/06/17 0600 06/07/17 0609  WBC 9.9 4.9 6.7  HGB 11.9* 9.2* 8.3*  HCT 35.2* 27.9* 24.5*  PLT 359 219 248  MCV 92.2 92.5 91.3  MCH 31.1 30.6 31.1  MCHC 33.7 33.1 34.1  RDW 14.6* 14.6* 14.3    Chemistries  Recent Labs  Lab 06/05/17 0517 06/06/17 0600 06/07/17 0609  NA 136 139 135  K 3.9 3.5 3.5  CL 104 109 101  CO2 22 24 25   GLUCOSE 147* 108* 104*  BUN 24* 14 11  CREATININE 2.37* 1.20 1.23  CALCIUM 9.0 8.4* 8.7*  MG 2.1  --   --    ------------------------------------------------------------------------------------------------------------------ estimated creatinine clearance is 87 mL/min (by C-G formula based on SCr of 1.23  mg/dL). ------------------------------------------------------------------------------------------------------------------ No results for input(s): HGBA1C in the last 72 hours. ------------------------------------------------------------------------------------------------------------------ Recent Labs    06/07/17 0609  CHOL 133  HDL 54  LDLCALC 55  TRIG 119  CHOLHDL 2.5   ------------------------------------------------------------------------------------------------------------------ Recent Labs    06/05/17 1243  TSH 1.215   ------------------------------------------------------------------------------------------------------------------ No results for input(s): VITAMINB12, FOLATE, FERRITIN, TIBC, IRON, RETICCTPCT in the last 72 hours.  Coagulation profile Recent Labs  Lab 06/05/17 1608  INR 0.80    No results for input(s): DDIMER in the last 72 hours.  Cardiac Enzymes Recent Labs  Lab 06/05/17 1243 06/05/17 1608 06/06/17 0600  TROPONINI 0.22* 0.38* 1.21*   ------------------------------------------------------------------------------------------------------------------ Invalid input(s): POCBNP    Assessment & Plan   Patient is 52 year old admitted with acute renal failure now with chest pain  1.  Acute non-ST MI  S/p two stents Continue therapy with aspirin and Plavix  patient is allergic to metoprolol  2.  Acute renal failure resolved with IV hydration 3.  Left leg pain obtain x-ray of the leg rule out fracture 4.  Hyperlipidemia continue Lipitor 5.  Hypothyroidism continue Synthroid     Code Status Orders  (From admission, onward)        Start     Ordered   06/05/17 1012  Full code  Continuous     06/05/17 1011    Code Status History    Date Active Date Inactive Code Status Order ID Comments User Context   02/23/2017 19:08 02/25/2017 19:49 Full Code [id number]  Adrian Saran, MD Inpatient   09/22/2016 02:52 09/22/2016 17:58 Full Code [id number]   PoggiExcell Seltzer, MD Inpatient    Advance Directive Documentation     Most Recent Value  Type of Advance Directive  Healthcare Power of Attorney  Pre-existing out of facility DNR order (yellow form or pink MOST form)  No data  "MOST" Form in Place?  No data  Consults  cards   DVT Prophylaxis heparin  Lab Results  Component Value Date   PLT 248 06/07/2017     Time Spent in minutes   Greater than 50% of time spent in care coordination and counseling patient regarding the condition and plan of care.   Auburn Bilberry M.D on 06/07/2017 at 5:34 PM  Between 7am to 6pm - Pager - [phone]  After 6pm go to [email or web address] - password [email or web address]  [facility] [place] [facility]   Office  [phone]

## 2017-06-08 ENCOUNTER — Encounter: Payer: Self-pay | Admitting: Internal Medicine

## 2017-06-08 LAB — BASIC METABOLIC PANEL
Anion gap: 9 (ref 5–15)
BUN: 10 mg/dL (ref 6–20)
CHLORIDE: 103 mmol/L (ref 101–111)
CO2: 23 mmol/L (ref 22–32)
CREATININE: 1.18 mg/dL (ref 0.61–1.24)
Calcium: 8.8 mg/dL — ABNORMAL LOW (ref 8.9–10.3)
GFR calc non Af Amer: >60 mL/min (ref >60)
Glucose, Bld: 115 mg/dL — ABNORMAL HIGH (ref 65–99)
POTASSIUM: 3.7 mmol/L (ref 3.5–5.1)
Sodium: 135 mmol/L (ref 135–145)

## 2017-06-08 LAB — CBC
HCT: 23.8 % — ABNORMAL LOW (ref 40.0–52.0)
HEMOGLOBIN: 8 g/dL — AB (ref 13.0–18.0)
MCH: 31 pg (ref 26.0–34.0)
MCHC: 33.5 g/dL (ref 32.0–36.0)
MCV: 92.5 fL (ref 80.0–100.0)
PLATELETS: 283 10*3/uL (ref 150–440)
RBC: 2.58 MIL/uL — AB (ref 4.40–5.90)
RDW: 14.5 % (ref 11.5–14.5)
WBC: 6.8 10*3/uL (ref 3.8–10.6)

## 2017-06-08 LAB — RETICULOCYTES
RBC.: 2.59 MIL/uL — AB (ref 4.40–5.90)
Retic Count, Absolute: 103.6 10*3/uL (ref 19.0–183.0)
Retic Ct Pct: 4 % — ABNORMAL HIGH (ref 0.4–3.1)

## 2017-06-08 LAB — FOLATE: FOLATE: 15.7 ng/mL (ref >5.9)

## 2017-06-08 LAB — IRON AND TIBC
Iron: 32 ug/dL — ABNORMAL LOW (ref 45–182)
SATURATION RATIOS: 13 % — AB (ref 17.9–39.5)
TIBC: 245 ug/dL — AB (ref 250–450)
UIBC: 213 ug/dL

## 2017-06-08 LAB — FERRITIN: Ferritin: 95 ng/mL (ref 24–336)

## 2017-06-08 LAB — VITAMIN B12: Vitamin B-12: 903 pg/mL (ref 180–914)

## 2017-06-08 LAB — HEPARIN LEVEL (UNFRACTIONATED): Heparin Unfractionated: <0.1 IU/mL — ABNORMAL LOW (ref 0.30–0.70)

## 2017-06-08 NOTE — Progress Notes (Addendum)
Wrong patient

## 2017-06-08 NOTE — Progress Notes (Signed)
[facility] Encounter Note  Patient: Cody Burns / Admit Date: 06/05/2017 / Date of Encounter: 06/08/2017, 5:25 AM   Subjective: No further chest pain overnight.  Medication management improving overall symptoms.  Heart rate and blood pressure elevated today.  Elevated troponin of 1.2 consistent with non-ST elevation myocardial infarction.  Patient tolerating medications well Cardiac catheterization shows mild anterior apical hypokinesis with a ejection fraction of 45% Critical native three-vessel coronary artery disease LIMA to LAD patent Graft to obtuse marginal 1 occluded Graft to diagonal 1 with 95% stenosis Graft to PDA with 90% stenosis  Review of Systems: Positive for: None Negative for: Vision change, hearing change, syncope, dizziness, nausea, vomiting,diarrhea, bloody stool, stomach pain, cough, congestion, diaphoresis, urinary frequency, urinary pain,skin lesions, skin rashes Others previously listed  Objective: Telemetry: Normal sinus rhythm Physical Exam: Blood pressure 122/64, pulse 95, temperature 99.3 F (37.4 C), temperature source Oral, resp. rate 20, height 6' (1.829 m), weight 105.2 kg (232 lb), SpO2 100 %. Body mass index is 31.46 kg/m. General: Well developed, well nourished, in no acute distress. Head: Normocephalic, atraumatic, sclera non-icteric, no xanthomas, nares are without discharge. Neck: No apparent masses Lungs: Normal respirations with no wheezes, no rhonchi, no rales , no crackles   Heart: Regular rate and rhythm, normal S1 S2, no murmur, no rub, no gallop, PMI is normal size and placement, carotid upstroke normal without bruit, jugular venous pressure normal Abdomen: Soft, non-tender, non-distended with normoactive bowel sounds. No hepatosplenomegaly. Abdominal aorta is normal size without bruit Extremities: No edema, no clubbing, no cyanosis, no ulcers,  Peripheral: 2+ radial, 2+ femoral, 2+ dorsal pedal pulses Neuro: Alert  and oriented. Moves all extremities spontaneously. Psych:  Responds to questions appropriately with a normal affect.   Intake/Output Summary (Last 24 hours) at 06/08/2017 0525 Last data filed at 06/07/2017 2014 Gross per 24 hour  Intake 2408.56 ml  Output 2200 ml  Net 208.56 ml    Inpatient Medications:  . amLODipine  5 mg Oral Daily  . aspirin  325 mg Oral Daily  . atorvastatin  80 mg Oral QHS  . buPROPion  300 mg Oral Daily  . calcium-vitamin D  1 tablet Oral Daily  . clopidogrel  75 mg Oral Daily  . cyclobenzaprine  10 mg Oral TID  . Dimethyl Fumarate  240 mg Oral BID  . escitalopram  20 mg Oral Daily  . folic acid  1 mg Oral Daily  . gabapentin  900 mg Oral TID  . levothyroxine  25 mcg Oral QAC breakfast  . lisinopril  20 mg Oral Daily  . montelukast  10 mg Oral Daily  . niacin  500 mg Oral QHS  . QUEtiapine  100 mg Oral QHS  . senna-docusate  2 tablet Oral BID  . sodium chloride flush  3 mL Intravenous Q12H  . sodium chloride flush  3 mL Intravenous Q12H  . cyanocobalamin  1,000 mcg Oral Daily   Infusions:  . sodium chloride Stopped (06/07/17 0557)  . sodium chloride    . nitroGLYCERIN 5 mcg/min (06/06/17 0206)    Labs: Recent Labs    06/06/17 0600 06/07/17 0609  NA 139 135  K 3.5 3.5  CL 109 101  CO2 24 25  GLUCOSE 108* 104*  BUN 14 11  CREATININE 1.20 1.23  CALCIUM 8.4* 8.7*   No results for input(s): AST, ALT, ALKPHOS, BILITOT, PROT, ALBUMIN in the last 72 hours. Recent Labs    06/06/17 0600 06/07/17 0609  WBC 4.9 6.7  HGB 9.2* 8.3*  HCT 27.9* 24.5*  MCV 92.5 91.3  PLT 219 248   Recent Labs    06/05/17 1243 06/05/17 1608 06/06/17 0600  TROPONINI 0.22* 0.38* 1.21*   Invalid input(s): POCBNP No results for input(s): HGBA1C in the last 72 hours.   Weights: Filed Weights   06/05/17 0519 06/07/17 0343 06/07/17 1114  Weight: 101.2 kg (223 lb) 105.2 kg (232 lb) 105.2 kg (232 lb)     Radiology/Studies:  Ct Head Wo Contrast  Result  Date: 06/05/2017 CLINICAL DATA:  Fall without loss of consciousness.  Dizziness. EXAM: CT HEAD WITHOUT CONTRAST TECHNIQUE: Contiguous axial images were obtained from the base of the skull through the vertex without intravenous contrast. COMPARISON:  February 23, 2017 FINDINGS: Brain: No subdural, epidural, or subarachnoid hemorrhage. Cerebellum, brainstem, and basal cisterns are unremarkable. Ventricles and sulci are prominent but stable. Mild white matter changes are stable. No acute cortical ischemia or infarct identified. No mass, mass effect, or midline shift. Vascular: Atherosclerotic vascular calcification of the carotid siphons. Skull: A craniotomy defect on the left is stable. Sinuses/Orbits: No acute finding. Other: None. IMPRESSION: No acute intracranial abnormalities. Electronically Signed   By: Gerome Sam III M.D   On: 06/05/2017 07:11   Ct Cervical Spine Wo Contrast  Result Date: 06/05/2017 CLINICAL DATA:  Neck pain, fall EXAM: CT CERVICAL SPINE WITHOUT CONTRAST TECHNIQUE: Multidetector CT imaging of the cervical spine was performed without intravenous contrast. Multiplanar CT image reconstructions were also generated. COMPARISON:  06/05/2017 FINDINGS: Alignment: Normal Skull base and vertebrae: No fracture Soft tissues and spinal canal: Prevertebral soft tissues are normal. No epidural or paraspinal hematoma. Disc levels: Anterior degenerative spurring at C4-5 and C5-6 and in the upper thoracic spine. Upper chest: No acute findings Other: No acute findings carotid artery calcifications. IMPRESSION: No acute bony abnormality. Electronically Signed   By: Charlett Nose M.D.   On: 06/05/2017 09:57   Dg Chest Port 1 View  Result Date: 06/05/2017 CLINICAL DATA:  Chest pressure since this afternoon. EXAM: PORTABLE CHEST 1 VIEW COMPARISON:  None. FINDINGS: Normal sized heart. Tortuous aorta. Post CABG changes. Clear lungs with normal vascularity. Unremarkable bones. IMPRESSION: No acute abnormality.  Electronically Signed   By: Beckie Salts M.D.   On: 06/05/2017 16:20   Dg Hip Unilat With Pelvis 2-3 Views Left  Result Date: 06/07/2017 CLINICAL DATA:  Left hip pain since a fall 1 week ago Initial encounter. EXAM: DG HIP (WITH OR WITHOUT PELVIS) 2-3V LEFT COMPARISON:  None. 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 The device is located. No fractures identified. Surrounding osseous and soft tissue structures appear normal. IMPRESSION: No acute abnormality. Left hip replacement in place without complicating feature. Electronically Signed   By: Drusilla Kanner M.D.   On: 06/07/2017 10:25     Assessment and Recommendation  52 y.o. male with known coronary artery disease essential hypertension mixed hyperlipidemia status post coronary artery bypass graft with chest discomfort and non-ST elevation myocardial infarction Cardiac catheterization showing mild LV systolic dysfunction with critical native coronary atherosclerosis.  There is new significant stenosis of graft to diagonal artery and right coronary artery as culprit of shortness of breath and chest discomfort and non-ST elevation myocardial infarction 1.  Continue dual antiplatelet therapy for myocardial infarction and significant graft stenosis 2.  Successful PCI and stent placement of right coronary artery and diagonal graft stenosis 3.  High intensity cholesterol therapy 4.  Reinstatement of essential hypertension treatment  5.  Cardiac rehabilitation with possible discharge home if ambulating well with appropriate medications listed as above today Signed, Arnoldo Hooker M.D. FACC

## 2017-06-08 NOTE — Evaluation (Signed)
Physical Therapy Evaluation Patient Details Name: Cody Burns MRN: [id number] DOB: [birth year] Today's Date: 06/08/2017   History of Present Illness  Pt is a 52 y.o. male presenting to hospital 06/05/17 with fall and dizziness and then reporting chest pain.  Pt admitted to hospital with acute renal failure, hypotension, elevated troponin, and NSTEMI.  Pt s/p cardiac cath 06/07/17 (2 stents).  PMH includes h/o blackouts, htn, MS, optic neuritis, seizures, CABG, R ankle fx May 2018 s/p ORIF, and L THR.  Clinical Impression  Prior to hospital admission, pt was modified independent with ambulation (used RW vs SPC off and on depending on how he was feeling).  Pt lives alone in 1 level apt with small step to enter ([facility]).  Currently pt is SBA supine to sit; and CGA to min assist for transfers and walking 15 feet with RW.  Distance ambulated limited d/t SOB; upon pt sitting down in recliner chair pt's R LE noted to be shaking (pt reports this is what happens right before his R LE and then L LE "give out" on him--pt with h/o falls d/t R LE giving out).  Overall pt demonstrates decreased activity tolerance and generalized weakness.  Pt would benefit from skilled PT to address noted impairments and functional limitations (see below for any additional details).  Upon hospital discharge, recommend pt discharge to STR.    Follow Up Recommendations SNF    Equipment Recommendations  Rolling walker with 5" wheels    Recommendations for Other Services OT consult     Precautions / Restrictions Precautions Precautions: Fall Restrictions Weight Bearing Restrictions: No      Mobility  Bed Mobility Overal bed mobility: Needs Assistance Bed Mobility: Supine to Sit     Supine to sit: Supervision;HOB elevated     General bed mobility comments: mild increased effort and time to perform; use of bed rail  Transfers Overall transfer level: Needs assistance Equipment used:  Rolling walker (2 wheeled) Transfers: Sit to/from [employer] Sit to Stand: Min guard;Min assist Stand pivot transfers: Min assist       General transfer comment: assist to steady standing and taking steps bed to recliner with RW  Ambulation/Gait Ambulation/Gait assistance: Min guard;Min assist Ambulation Distance (Feet): 15 Feet Assistive device: Rolling walker (2 wheeled)   Gait velocity: decreased   General Gait Details: decreased B step length; overall mildly unsteady with mild R lean requiring assist to steady; limited distance d/t SOB  Stairs            Wheelchair Mobility    Modified Rankin (Stroke Patients Only)       Balance Overall balance assessment: Needs assistance Sitting-balance support: No upper extremity supported;Feet supported Sitting balance-Leahy Scale: Good Sitting balance - Comments: steady sitting reaching within BOS   Standing balance support: Bilateral upper extremity supported Standing balance-Leahy Scale: Poor Standing balance comment: requires B UE support for static standing balance                             Pertinent Vitals/Pain Pain Assessment: 0-10 Pain Score: 2  Pain Location: posterior L leg (bruising noted posterior L leg) Pain Descriptors / Indicators: Tightness Pain Intervention(s): Limited activity within patient's tolerance;Monitored during session;Repositioned  HR 97-118 bpm during session.    Home Living Family/patient expects to be discharged to:: Private residence Living Arrangements: Alone   Type of Home: Apartment Home Access: Stairs to enter  Entrance Stairs-Number of Steps: 1 small step to enter (pt reports [facility] ramp) Home Layout: One level Home Equipment: Walker - 2 wheels;Walker - 4 wheels;Cane - single point;Electric scooter;Shower seat - built in      Prior Function Level of Independence: Independent with assistive device(s)         Comments: Pt uses RW  or SPC "off and on" depending on how he is feeling.  R LE gives out and pt falls about 3-4x's a month.     Hand Dominance        Extremity/Trunk Assessment   Upper Extremity Assessment Upper Extremity Assessment: Generalized weakness    Lower Extremity Assessment Lower Extremity Assessment: RLE deficits/detail;LLE deficits/detail RLE Deficits / Details: hip flexion at least 3+/5; knee flexion/extension at least 3+/5; DF at least 3+/5 LLE Deficits / Details: hip flexion at least 3+/5; knee flexion/extension at least 3+/5; DF at least 3+/5    Cervical / Trunk Assessment Cervical / Trunk Assessment: Normal  Communication   Communication: No difficulties  Cognition Arousal/Alertness: Awake/alert Behavior During Therapy: WFL for tasks assessed/performed Overall Cognitive Status: Within Functional Limits for tasks assessed                                        General Comments General comments (skin integrity, edema, etc.): Pt resting in bed upon PT entry.  Bruising noted posterior L leg (pt reports from fall).  Nursing cleared pt for participation in physical therapy.  Pt agreeable to PT session.    Exercises     Assessment/Plan    PT Assessment Patient needs continued PT services  PT Problem List Decreased strength;Decreased activity tolerance;Decreased balance;Decreased mobility       PT Treatment Interventions DME instruction;Gait training;Stair training;Functional mobility training;Therapeutic activities;Therapeutic exercise;Balance training;Patient/family education    PT Goals (Current goals can be found in the Care Plan section)  Acute Rehab PT Goals Patient Stated Goal: to get stronger and improve mobility PT Goal Formulation: With patient Time For Goal Achievement: 06/22/17 Potential to Achieve Goals: Good    Frequency Min 2X/week   Barriers to discharge Decreased caregiver support      Co-evaluation               AM-PAC PT "6  Clicks" Daily Activity  Outcome Measure Difficulty turning over in bed (including adjusting bedclothes, sheets and blankets)?: A Little Difficulty moving from lying on back to sitting on the side of the bed? : A Little Difficulty sitting down on and standing up from a chair with arms (e.g., wheelchair, bedside commode, etc,.)?: Unable Help needed moving to and from a bed to chair (including a wheelchair)?: A Little Help needed walking in hospital room?: A Little Help needed climbing 3-5 steps with a railing? : Total 6 Click Score: 14    End of Session Equipment Utilized During Treatment: Gait belt Activity Tolerance: Patient limited by fatigue Patient left: in chair;with call bell/phone within reach;with chair alarm set Nurse Communication: Mobility status;Precautions(info left via white board; primary nurse did not answer phone when therapist called to attempt update) PT Visit Diagnosis: Other abnormalities of gait and mobility (R26.89);Muscle weakness (generalized) (M62.81);History of falling (Z91.81);Repeated falls (R29.6)    Time: [id number] (therapist came back for mobility portion of evaluation d/t waiting for nitro drip to be discontinued; nursing aware and called PT when it was discontinued) PT Time Calculation (min) (  ACUTE ONLY): 19 min   Charges:   PT Evaluation $PT Eval Low Complexity: 1 Low PT Treatments $Therapeutic Activity: 8-22 mins   PT G CodesHendricks Limes, PT 06/08/17, 5:45 PM [phone]

## 2017-06-08 NOTE — Care Management (Signed)
At present appears is not being discharged home on cost prohibitive antiplatelet medication

## 2017-06-08 NOTE — Progress Notes (Signed)
[employer] - [place] at [facility]                                                                                                                                                                                  Patient Demographics   Cody Burns, is a 52 y.o. male, DOB - 04-10-1966, MCE:[id number]  Admit date - 06/05/2017   Admitting Physician Auburn Bilberry, MD  Outpatient Primary MD for the patient is [facility]   LOS - 3  Subjective: Patient continues to complain of some pain under the leg  Review of Systems:   CONSTITUTIONAL: No documented fever. No fatigue, weakness. No weight gain, no weight loss.  EYES: No blurry or double vision.  ENT: No tinnitus. No postnasal drip. No redness of the oropharynx.  RESPIRATORY: No cough, no wheeze, no hemoptysis.  Positive dyspnea.  CARDIOVASCULAR: Positive chest pain. No orthopnea. No palpitations. No syncope.  GASTROINTESTINAL: No nausea, no vomiting or diarrhea. No abdominal pain. No melena or hematochezia.  GENITOURINARY: No dysuria or hematuria.  ENDOCRINE: No polyuria or nocturia. No heat or cold intolerance.  HEMATOLOGY: No anemia. No bruising. No bleeding.  INTEGUMENTARY: No rashes. No lesions.  MUSCULOSKELETAL: No arthritis. No swelling. No gout.  NEUROLOGIC: No numbness, tingling, or ataxia. No seizure-type activity.  PSYCHIATRIC: No anxiety. No insomnia. No ADD.    Vitals:   Vitals:   06/07/17 2057 06/08/17 0521 06/08/17 0752 06/08/17 1539  BP: (!) 126/59 122/64 (!) 119/49 131/69  Pulse: (!) 101 95 97 97  Resp: 20 20 19 16   Temp: 99.4 F (37.4 C) 99.3 F (37.4 C) 97.7 F (36.5 C) 97.6 F (36.4 C)  TempSrc: Oral Oral Oral Oral  SpO2: 100% 100% 100%   Weight:      Height:        Wt Readings from Last 3 Encounters:  06/07/17 232 lb (105.2 kg)  02/23/17 230 lb 9.6 oz (104.6 kg)  09/22/16 238 lb (108 kg)     Intake/Output Summary (Last 24 hours) at 06/08/2017 1620 Last data filed  at 06/08/2017 1512 Gross per 24 hour  Intake 1217.26 ml  Output 2000 ml  Net -782.74 ml    Physical Exam:   GENERAL: Pleasant-appearing in no apparent distress.  HEAD, EYES, EARS, NOSE AND THROAT: Atraumatic, normocephalic. Extraocular muscles are intact. Pupils equal and reactive to light. Sclerae anicteric. No conjunctival injection. No oro-pharyngeal erythema.  NECK: Supple. There is no jugular venous distention. No bruits, no lymphadenopathy, no thyromegaly.  HEART: Regular rate and rhythm,. No murmurs, no rubs, no clicks.  LUNGS: Clear to auscultation bilaterally. No rales or rhonchi. No wheezes.  ABDOMEN: Soft, flat,  nontender, nondistended. Has good bowel sounds. No hepatosplenomegaly appreciated.  EXTREMITIES: No evidence of any cyanosis, clubbing, or peripheral edema.  +2 pedal and radial pulses bilaterally.  NEUROLOGIC: The patient is alert, awake, and oriented x3 with no focal motor or sensory deficits appreciated bilaterally.  SKIN: Bruising under the left leg Psych: Not anxious, depressed LN: No inguinal LN enlargement    Antibiotics   Anti-infectives (From admission, onward)   None      Medications   Scheduled Meds: . amLODipine  5 mg Oral Daily  . aspirin  325 mg Oral Daily  . atorvastatin  80 mg Oral QHS  . buPROPion  300 mg Oral Daily  . calcium-vitamin D  1 tablet Oral Daily  . clopidogrel  75 mg Oral Daily  . cyclobenzaprine  10 mg Oral TID  . Dimethyl Fumarate  240 mg Oral BID  . escitalopram  20 mg Oral Daily  . folic acid  1 mg Oral Daily  . gabapentin  900 mg Oral TID  . levothyroxine  25 mcg Oral QAC breakfast  . lisinopril  20 mg Oral Daily  . montelukast  10 mg Oral Daily  . niacin  500 mg Oral QHS  . QUEtiapine  100 mg Oral QHS  . senna-docusate  2 tablet Oral BID  . sodium chloride flush  3 mL Intravenous Q12H  . sodium chloride flush  3 mL Intravenous Q12H  . cyanocobalamin  1,000 mcg Oral Daily   Continuous Infusions: . sodium  chloride     PRN Meds:.sodium chloride, acetaminophen **OR** acetaminophen, morphine injection, ondansetron **OR** ondansetron (ZOFRAN) IV, oxyCODONE, sodium chloride flush, traMADol, trolamine salicylate   Data Review:   Micro Results No results found for this or any previous visit (from the past 240 hour(s)).  Radiology Reports Ct Head Wo Contrast  Result Date: 06/05/2017 CLINICAL DATA:  Fall without loss of consciousness.  Dizziness. EXAM: CT HEAD WITHOUT CONTRAST TECHNIQUE: Contiguous axial images were obtained from the base of the skull through the vertex without intravenous contrast. COMPARISON:  February 23, 2017 FINDINGS: Brain: No subdural, epidural, or subarachnoid hemorrhage. Cerebellum, brainstem, and basal cisterns are unremarkable. Ventricles and sulci are prominent but stable. Mild white matter changes are stable. No acute cortical ischemia or infarct identified. No mass, mass effect, or midline shift. Vascular: Atherosclerotic vascular calcification of the carotid siphons. Skull: A craniotomy defect on the left is stable. Sinuses/Orbits: No acute finding. Other: None. IMPRESSION: No acute intracranial abnormalities. Electronically Signed   By: Gerome Sam III M.D   On: 06/05/2017 07:11   Ct Cervical Spine Wo Contrast  Result Date: 06/05/2017 CLINICAL DATA:  Neck pain, fall EXAM: CT CERVICAL SPINE WITHOUT CONTRAST TECHNIQUE: Multidetector CT imaging of the cervical spine was performed without intravenous contrast. Multiplanar CT image reconstructions were also generated. COMPARISON:  06/05/2017 FINDINGS: Alignment: Normal Skull base and vertebrae: No fracture Soft tissues and spinal canal: Prevertebral soft tissues are normal. No epidural or paraspinal hematoma. Disc levels: Anterior degenerative spurring at C4-5 and C5-6 and in the upper thoracic spine. Upper chest: No acute findings Other: No acute findings carotid artery calcifications. IMPRESSION: No acute bony abnormality.  Electronically Signed   By: Charlett Nose M.D.   On: 06/05/2017 09:57   Dg Chest Port 1 View  Result Date: 06/05/2017 CLINICAL DATA:  Chest pressure since this afternoon. EXAM: PORTABLE CHEST 1 VIEW COMPARISON:  None. FINDINGS: Normal sized heart. Tortuous aorta. Post CABG changes. Clear lungs with normal vascularity. Unremarkable bones.  IMPRESSION: No acute abnormality. Electronically Signed   By: Beckie Salts M.D.   On: 06/05/2017 16:20   Dg Hip Unilat With Pelvis 2-3 Views Left  Result Date: 06/07/2017 CLINICAL DATA:  Left hip pain since a fall 1 week ago Initial encounter. EXAM: DG HIP (WITH OR WITHOUT PELVIS) 2-3V LEFT COMPARISON:  None. 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 The device is located. No fractures identified. Surrounding osseous and soft tissue structures appear normal. IMPRESSION: No acute abnormality. Left hip replacement in place without complicating feature. Electronically Signed   By: Drusilla Kanner M.D.   On: 06/07/2017 10:25     CBC Recent Labs  Lab 06/05/17 0517 06/06/17 0600 06/07/17 0609 06/08/17 0543  WBC 9.9 4.9 6.7 6.8  HGB 11.9* 9.2* 8.3* 8.0*  HCT 35.2* 27.9* 24.5* 23.8*  PLT 359 219 248 283  MCV 92.2 92.5 91.3 92.5  MCH 31.1 30.6 31.1 31.0  MCHC 33.7 33.1 34.1 33.5  RDW 14.6* 14.6* 14.3 14.5    Chemistries  Recent Labs  Lab 06/05/17 0517 06/06/17 0600 06/07/17 0609 06/08/17 0543  NA 136 139 135 135  K 3.9 3.5 3.5 3.7  CL 104 109 101 103  CO2 22 24 25 23   GLUCOSE 147* 108* 104* 115*  BUN 24* 14 11 10   CREATININE 2.37* 1.20 1.23 1.18  CALCIUM 9.0 8.4* 8.7* 8.8*  MG 2.1  --   --   --    ------------------------------------------------------------------------------------------------------------------ estimated creatinine clearance is 90.7 mL/min (by C-G formula based on SCr of 1.18 mg/dL). ------------------------------------------------------------------------------------------------------------------ No results for input(s):  HGBA1C in the last 72 hours. ------------------------------------------------------------------------------------------------------------------ Recent Labs    06/07/17 0609  CHOL 133  HDL 54  LDLCALC 55  TRIG 119  CHOLHDL 2.5   ------------------------------------------------------------------------------------------------------------------ No results for input(s): TSH, T4TOTAL, T3FREE, THYROIDAB in the last 72 hours.  Invalid input(s): FREET3 ------------------------------------------------------------------------------------------------------------------ Recent Labs    06/08/17 0543 06/08/17 0953  VITAMINB12  --  903  FOLATE 15.7  --   FERRITIN 95  --   TIBC 245*  --   IRON 32*  --   RETICCTPCT 4.0*  --     Coagulation profile Recent Labs  Lab 06/05/17 1608  INR 0.80    No results for input(s): DDIMER in the last 72 hours.  Cardiac Enzymes Recent Labs  Lab 06/05/17 1243 06/05/17 1608 06/06/17 0600  TROPONINI 0.22* 0.38* 1.21*   ------------------------------------------------------------------------------------------------------------------ Invalid input(s): POCBNP    Assessment & Plan   Patient is 52 year old admitted with acute renal failure now with chest pain  1.  Acute non-ST MI  S/p two stents Continue therapy with aspirin and Plavix  patient is allergic to metoprolol  2.  Acute renal failure resolved with IV hydration 3.  Acute anemia will need to make sure hemoglobin stable tomorrow  4. Left leg pain x-rays negative he does have some bruising under the leg PT evaluation 5.  Hyperlipidemia continue Lipitor 6..  Hypothyroidism continue Synthroid     Code Status Orders  (From admission, onward)        Start     Ordered   06/05/17 1012  Full code  Continuous     06/05/17 1011    Code Status History    Date Active Date Inactive Code Status Order ID Comments User Context   02/23/2017 19:08 02/25/2017 19:49 Full Code [id number]  Adrian Saran, MD Inpatient   09/22/2016 02:52 09/22/2016 17:58 Full Code [id number]  Poggi, Excell Seltzer, MD Inpatient  Advance Directive Documentation     Most Recent Value  Type of Advance Directive  Healthcare Power of Attorney  Pre-existing out of facility DNR order (yellow form or pink MOST form)  No data  "MOST" Form in Place?  No data           Consults  cards   DVT Prophylaxis heparin  Lab Results  Component Value Date   PLT 283 06/08/2017     Time Spent in minutes   Greater than 50% of time spent in care coordination and counseling patient regarding the condition and plan of care.   Auburn Bilberry M.D on 06/08/2017 at 4:20 PM  Between 7am to 6pm - Pager - [phone]  After 6pm go to [email or web address] - password [email or web address] [facility]  [facility]   Office  [phone]

## 2017-06-08 NOTE — Plan of Care (Signed)
  Progressing Education: Knowledge of General Education information will improve 06/08/2017 1432 - Progressing by Tomie China, RN Health Behavior/Discharge Planning: Ability to manage health-related needs will improve 06/08/2017 1432 - Progressing by Tomie China, RN Clinical Measurements: Ability to maintain clinical measurements within normal limits will improve 06/08/2017 1432 - Progressing by Tomie China, RN Will remain free from infection 06/08/2017 1432 - Progressing by Tomie China, RN Diagnostic test results will improve 06/08/2017 1432 - Progressing by Tomie China, RN Respiratory complications will improve 06/08/2017 1432 - Progressing by Tomie China, RN Cardiovascular complication will be avoided 06/08/2017 1432 - Progressing by Tomie China, RN Activity: Risk for activity intolerance will decrease 06/08/2017 1432 - Progressing by Tomie China, RN Nutrition: Adequate nutrition will be maintained 06/08/2017 1432 - Progressing by Tomie China, RN Coping: Level of anxiety will decrease 06/08/2017 1432 - Progressing by Tomie China, RN Elimination: Will not experience complications related to bowel motility 06/08/2017 1432 - Progressing by Tomie China, RN Will not experience complications related to urinary retention 06/08/2017 1432 - Progressing by Tomie China, RN Pain Managment: General experience of comfort will improve 06/08/2017 1432 - Progressing by Tomie China, RN Safety: Ability to remain free from injury will improve 06/08/2017 1432 - Progressing by Tomie China, RN Skin Integrity: Risk for impaired skin integrity will decrease 06/08/2017 1432 - Progressing by Tomie China, RN Education: Understanding of CV disease, CV risk reduction, and recovery process will improve 06/08/2017 1432 -  Progressing by Tomie China, RN Activity: Ability to return to baseline activity level will improve 06/08/2017 1432 - Progressing by Tomie China, RN Cardiovascular: Ability to achieve and maintain adequate cardiovascular perfusion will improve 06/08/2017 1432 - Progressing by Tomie China, RN Vascular access site(s) Level 0-1 will be maintained 06/08/2017 1432 - Progressing by Tomie China, RN Health Behavior/Discharge Planning: Ability to safely manage health-related needs after discharge will improve 06/08/2017 1432 - Progressing by Tomie China, RN

## 2017-06-09 LAB — BASIC METABOLIC PANEL
ANION GAP: 9 (ref 5–15)
BUN: 12 mg/dL (ref 6–20)
CALCIUM: 9.3 mg/dL (ref 8.9–10.3)
CO2: 25 mmol/L (ref 22–32)
Chloride: 104 mmol/L (ref 101–111)
Creatinine, Ser: 1.09 mg/dL (ref 0.61–1.24)
GFR calc non Af Amer: >60 mL/min (ref >60)
GLUCOSE: 107 mg/dL — AB (ref 65–99)
POTASSIUM: 4.2 mmol/L (ref 3.5–5.1)
Sodium: 138 mmol/L (ref 135–145)

## 2017-06-09 LAB — CBC
HEMATOCRIT: 23.9 % — AB (ref 40.0–52.0)
HEMOGLOBIN: 8 g/dL — AB (ref 13.0–18.0)
MCH: 30.7 pg (ref 26.0–34.0)
MCHC: 33.3 g/dL (ref 32.0–36.0)
MCV: 92.3 fL (ref 80.0–100.0)
Platelets: 274 10*3/uL (ref 150–440)
RBC: 2.59 MIL/uL — AB (ref 4.40–5.90)
RDW: 14.6 % — ABNORMAL HIGH (ref 11.5–14.5)
WBC: 6.5 10*3/uL (ref 3.8–10.6)

## 2017-06-09 MED ORDER — IRON SUCROSE 20 MG/ML IV SOLN
300.0000 mg | Freq: Once | INTRAVENOUS | Status: AC
  Administered 2017-06-09: 300 mg via INTRAVENOUS
  Filled 2017-06-09: qty 15

## 2017-06-09 NOTE — Plan of Care (Signed)
  Activity: Risk for activity intolerance will decrease 06/09/2017 1925 - Progressing by Edison Pace D, RN   Nutrition: Adequate nutrition will be maintained 06/09/2017 1925 - Progressing by Edison Pace D, RN   Coping: Level of anxiety will decrease 06/09/2017 1925 - Progressing by Jerene Dilling, RN   Clinical Measurements: Cardiovascular complication will be avoided 06/09/2017 1925 - Progressing by Jerene Dilling, RN    Safety: Ability to remain free from injury will improve 06/09/2017 1925 - Progressing by Jerene Dilling, RN   Pain Managment: General experience of comfort will improve 06/09/2017 1925 - Progressing by Jerene Dilling, RN   Elimination: Will not experience complications related to urinary retention 06/09/2017 1925 - Progressing by Jerene Dilling, RN

## 2017-06-09 NOTE — Clinical Social Work Note (Signed)
CSW spoke with patient, and he is in agreement for going to short term rehab.  CSW was given permission to begin bed search in [place].  Ervin Knack. Anothy Bufano, MSW, Theresia Majors [phone]  06/09/2017 12:04 PM

## 2017-06-09 NOTE — Progress Notes (Signed)
52 y.o. male with known coronary artery disease essential hypertension mixed hyperlipidemia status post coronary artery bypass graft with chest discomfort and non-ST elevation myocardial infarction.  Cardiac Cath performed on 06/07/2017.  Per Dr. Philemon Kingdom note:   Cardiac catheterization shows mild anterior apical hypokinesis with a ejection fraction of 45% Critical native three-vessel coronary artery disease LIMA to LAD patent Graft to obtuse marginal 1 occluded Graft to diagonal 1 with 95% stenosis Graft to PDA with 90% stenosis  PCI with DES to SVG to diagonal 1 proximal and SVG to RCA ostial.    Patient on the follow medications:  ASA, Plavix, Lisinopril, Lipitor (Patient allergic to metoprolol), and Norvasc.    Patient referred to Cardiac Rehab with dx of NSTEMI / Coronary Stents.    "Heart Attack Bouncing Back" booklet given and reviewed with patient. Discussed the definition of CAD. Reviewed the location CAD and where stents were placed. Explained to patient he will be given a stent card.. Explained the purpose of the stent card. Instructed patient to keep stent card in his wallet.  ? Discussed modifiable risk factors including controlling blood pressure, cholesterol, and blood sugar; following heart healthy diet; maintaining healthy weight; exercise; and smoking cessation, if applicable. ?Note: Patient is a NEVER smoker.   ? Discussed cardiac medications including rationale for taking, mechanisms of action, and side effects. Stressed the importance of taking medications as prescribed.  ? Discussed emergency plan for heart attack symptoms. Patient verbalized understanding of need to call 911 and not to drive herself to ER if having cardiac symptoms / chest pain.  ? Heart healthy diet of low sodium, low fat, low cholesterol heart healthy diet discussed. Information on diet provided. Patient informed this RN he understands about the diet, but sometimes it is difficult for him to prepare  heart healthy food.  Patient lives alone.  Brother lives close by.   ? Exercise - Patient does not currently exercise. Patient has MS.  He was diagnosed in 1993.  He uses a  Environmental consultant and on a good day  ambulates with a cane.  On the bad days with his MS is uses a wheelchair. Explained to patient he has been referred to Cardiac Rehab due to dx of NSTEMI and coronary stents.  Patient informed this RN that he participated in Cardiac Rehab at [facility] when he had his CABG.  He further stated he felt like it was a good program and that he benefited from participating.  At present PT has recommended the patient to go SNF for STR.    Discussed with patient he may have HH PT ordered after he returns home.  Explained to patient he would be able to participate in Cardiac Rehab after he has completed STR and HH PT.  [facility] Cardiac Rehab brochure with department phone number provided to patient.  Informed patient the Cardiac Rehab department will contact him in 3 weeks to see how STR is going and when he might be discharged home.  Patient agreeable with plan.    Smoking Cessation - Patient is a NEVER smoker.   ?  Patient thanked me for providing the above information.  ?  Army Melia, RN, BSN, [facility] Cardiovascular and Pulmonary Nurse Navigator    ?

## 2017-06-09 NOTE — [place] FL2 (Signed)
[place] MEDICAID FL2 LEVEL OF CARE SCREENING TOOL     IDENTIFICATION  Patient Name: Cody Burns Birthdate: 13-Mar-1966 Sex: male Admission Date (Current Location): 06/05/2017  [place] and [place] Number:  Chiropodist and Address:  [facility], [address], [place], [place] [address]      Provider Number: [id number]  Attending Physician Name and Address:  Auburn Bilberry, MD  Relative Name and Phone Number:  Vishwa, [place] [phone]     Current Level of Care: Hospital Recommended Level of Care: Skilled Nursing Facility Prior Approval Number:    Date Approved/Denied:   PASRR Number: [id number]  Discharge Plan: SNF    Current Diagnoses: Patient Active Problem List   Diagnosis Date Noted  . Acute renal failure (ARF) (HCC) 06/05/2017  . Acute kidney injury (HCC) 02/23/2017  . Closed right ankle fracture 09/22/2016    Orientation RESPIRATION BLADDER Height & Weight     Self, Time, Situation, Place  Normal Incontinent Weight: 232 lb (105.2 kg) Height:  6' (182.9 cm)  BEHAVIORAL SYMPTOMS/MOOD NEUROLOGICAL BOWEL NUTRITION STATUS      Continent Diet(Cardiac)  AMBULATORY STATUS COMMUNICATION OF NEEDS Skin   Limited Assist Verbally Normal                       Personal Care Assistance Level of Assistance  Bathing, Dressing, Feeding Bathing Assistance: Limited assistance Feeding assistance: Independent Dressing Assistance: Limited assistance     Functional Limitations Info  Sight, Hearing, Speech Sight Info: Adequate Hearing Info: Adequate Speech Info: Adequate    SPECIAL CARE FACTORS FREQUENCY  PT (By licensed PT)     PT Frequency: 5x a week              Contractures Contractures Info: Not present    Additional Factors Info  Code Status, Allergies Code Status Info: Full Code Allergies Info: HYDROCHLOROTHIAZIDE W-TRIAMTERENE, METOPROLOL, PENICILLINS           Current Medications (06/09/2017):   This is the current hospital active medication list Current Facility-Administered Medications  Medication Dose Route Frequency Provider Last Rate Last Dose  . 0.9 %  sodium chloride infusion  250 mL Intravenous PRN Callwood, Dwayne D, MD      . acetaminophen (TYLENOL) tablet 650 mg  650 mg Oral Q6H PRN Auburn Bilberry, MD   650 mg at 06/05/17 2154   Or  . acetaminophen (TYLENOL) suppository 650 mg  650 mg Rectal Q6H PRN Auburn Bilberry, MD      . amLODipine (NORVASC) tablet 5 mg  5 mg Oral Daily Lamar Blinks, MD   5 mg at 06/09/17 1103  . aspirin tablet 325 mg  325 mg Oral Daily Auburn Bilberry, MD   325 mg at 06/09/17 1106  . atorvastatin (LIPITOR) tablet 80 mg  80 mg Oral QHS Auburn Bilberry, MD   80 mg at 06/08/17 2127  . buPROPion (WELLBUTRIN XL) 24 hr tablet 300 mg  300 mg Oral Daily Auburn Bilberry, MD   300 mg at 06/09/17 1103  . calcium-vitamin D (OSCAL WITH D) 500-200 MG-UNIT per tablet 1 tablet  1 tablet Oral Daily Auburn Bilberry, MD   1 tablet at 06/09/17 1103  . clopidogrel (PLAVIX) tablet 75 mg  75 mg Oral Daily Auburn Bilberry, MD   75 mg at 06/09/17 1104  . cyclobenzaprine (FLEXERIL) tablet 10 mg  10 mg Oral TID Auburn Bilberry, MD   10 mg at 06/09/17 1104  . Dimethyl Fumarate CPDR  240 mg  240 mg Oral BID Auburn Bilberry, MD   240 mg at 06/09/17 1131  . escitalopram (LEXAPRO) tablet 20 mg  20 mg Oral Daily Auburn Bilberry, MD   20 mg at 06/09/17 1106  . folic acid (FOLVITE) tablet 1 mg  1 mg Oral Daily Auburn Bilberry, MD   1 mg at 06/09/17 1104  . gabapentin (NEURONTIN) capsule 900 mg  900 mg Oral TID Auburn Bilberry, MD   900 mg at 06/09/17 1105  . levothyroxine (SYNTHROID, LEVOTHROID) tablet 25 mcg  25 mcg Oral QAC breakfast Auburn Bilberry, MD   25 mcg at 06/09/17 [phone]  . lisinopril (PRINIVIL,ZESTRIL) tablet 20 mg  20 mg Oral Daily Lamar Blinks, MD   20 mg at 06/09/17 1104  . montelukast (SINGULAIR) tablet 10 mg  10 mg Oral Daily Auburn Bilberry, MD   10 mg at  06/09/17 1104  . morphine 2 MG/ML injection 2 mg  2 mg Intravenous Q4H PRN Auburn Bilberry, MD   2 mg at 06/06/17 1253  . niacin (NIASPAN) CR tablet 500 mg  500 mg Oral QHS Auburn Bilberry, MD   500 mg at 06/08/17 2128  . ondansetron (ZOFRAN) tablet 4 mg  4 mg Oral Q6H PRN Auburn Bilberry, MD       Or  . ondansetron [facility]) injection 4 mg  4 mg Intravenous Q6H PRN Auburn Bilberry, MD      . oxyCODONE (Oxy IR/ROXICODONE) immediate release tablet 5-10 mg  5-10 mg Oral Q4H PRN Auburn Bilberry, MD   10 mg at 06/06/17 2204  . QUEtiapine (SEROQUEL) tablet 100 mg  100 mg Oral QHS Auburn Bilberry, MD   100 mg at 06/08/17 2127  . senna-docusate (Senokot-S) tablet 2 tablet  2 tablet Oral BID Auburn Bilberry, MD   2 tablet at 06/09/17 1103  . sodium chloride flush (NS) 0.9 % injection 3 mL  3 mL Intravenous Q12H Auburn Bilberry, MD   3 mL at 06/09/17 1107  . sodium chloride flush (NS) 0.9 % injection 3 mL  3 mL Intravenous Q12H Callwood, Dwayne D, MD   3 mL at 06/09/17 1106  . sodium chloride flush (NS) 0.9 % injection 3 mL  3 mL Intravenous PRN Callwood, Dwayne D, MD      . traMADol (ULTRAM) tablet 50 mg  50 mg Oral BID PRN Auburn Bilberry, MD      . trolamine salicylate (ASPERCREME) 10 % cream   Topical PRN Auburn Bilberry, MD      . vitamin B-12 (CYANOCOBALAMIN) tablet 1,000 mcg  1,000 mcg Oral Daily Auburn Bilberry, MD   1,000 mcg at 06/09/17 1104     Discharge Medications: Please see discharge summary for a list of discharge medications.  Relevant Imaging Results:  Relevant Lab Results:   Additional Information SSN [id number]  Darleene Cleaver, [place]

## 2017-06-09 NOTE — Clinical Social Work Note (Signed)
Clinical Social Work Assessment  Patient Details  Name: Cody Burns MRN: [id number] Date of Birth: 29-Aug-1965  Date of referral:  06/09/17               Reason for consult:  Facility Placement                Permission sought to share information with:  Facility Medical sales representative Permission granted to share information::  Yes, Verbal Permission Granted  Name::     Cody Burns, Cody Burns [phone]   Agency::  SNF admissions  Relationship::     Contact Information:     Housing/Transportation Living arrangements for the past 2 months:  Single Family Home Source of Information:  Patient Patient Interpreter Needed:  None Criminal Activity/Legal Involvement Pertinent to Current Situation/Hospitalization:  No - Comment as needed Significant Relationships:  Other Family Members Lives with:  Self Do you feel safe going back to the place where you live?  No Need for family participation in patient care:  No (Coment)  Care giving concerns:  Patient feel he needs some short term rehab before he returns back home.     Social Worker assessment / plan:  Patient is a 52 year old male who lives alone and has MS.  Patient states he has been to SNF before and has been at [facility].  Patient was explained role of CSW and process for looking for placement and how insurance will pay for the stay.  Patient expressed he understands that he needs to go to SNF for rehab, and gave CSW permission to begin bed search in [facility].  Employment status:  Disabled (Comment on whether or not currently receiving Disability) Insurance information:  Medicare PT Recommendations:  Skilled Nursing Facility Information / Referral to community resources:  Skilled Nursing Facility  Patient/Family's Response to care:  Patient is in agreement to going to SNF for short term rehab  Patient/Family's Understanding of and Emotional Response to Diagnosis, Current Treatment, and Prognosis:  Patient hopeful  that patient will not have to be at [facility] for very long.  Emotional Assessment Appearance:  Appears stated age Attitude/Demeanor/Rapport:    Affect (typically observed):  Calm, Appropriate, Stable Orientation:  Oriented to Self, Oriented to Place, Oriented to  Time, Oriented to Situation Alcohol / Substance use:  Not Applicable Psych involvement (Current and /or in the community):  No (Comment)  Discharge Needs  Concerns to be addressed:  Care Coordination, Lack of Support Readmission within the last 30 days:  No Current discharge risk:  Lack of support system, Lives alone Barriers to Discharge:  Continued Medical Work up   [place] Constable 06/09/2017, 12:07 PM

## 2017-06-09 NOTE — Plan of Care (Signed)
  Progressing Education: Knowledge of General Education information will improve 06/09/2017 2018 - Progressing by Kalman Jewels, RN Health Behavior/Discharge Planning: Ability to manage health-related needs will improve 06/09/2017 2018 - Progressing by Kalman Jewels, RN Clinical Measurements: Ability to maintain clinical measurements within normal limits will improve 06/09/2017 2018 - Progressing by Kalman Jewels, RN Will remain free from infection 06/09/2017 2018 - Progressing by Kalman Jewels, RN Diagnostic test results will improve 06/09/2017 2018 - Progressing by Kalman Jewels, RN Respiratory complications will improve 06/09/2017 2018 - Progressing by Kalman Jewels, RN Cardiovascular complication will be avoided 06/09/2017 2018 - Progressing by Kalman Jewels, RN Activity: Risk for activity intolerance will decrease 06/09/2017 2018 - Progressing by Kalman Jewels, RN Nutrition: Adequate nutrition will be maintained 06/09/2017 2018 - Progressing by Kalman Jewels, RN Coping: Level of anxiety will decrease 06/09/2017 2018 - Progressing by Kalman Jewels, RN Elimination: Will not experience complications related to bowel motility 06/09/2017 2018 - Progressing by Kalman Jewels, RN Will not experience complications related to urinary retention 06/09/2017 2018 - Progressing by Kalman Jewels, RN Pain Managment: General experience of comfort will improve 06/09/2017 2018 - Progressing by Kalman Jewels, RN Safety: Ability to remain free from injury will improve 06/09/2017 2018 - Progressing by Kalman Jewels, RN Skin Integrity: Risk for impaired skin integrity will decrease 06/09/2017 2018 - Progressing by Kalman Jewels, RN Education: Understanding of CV disease, CV risk reduction, and recovery process will improve 06/09/2017 2018 - Progressing by Kalman Jewels, RN Activity: Ability to return to baseline  activity level will improve 06/09/2017 2018 - Progressing by Kalman Jewels, RN Cardiovascular: Ability to achieve and maintain adequate cardiovascular perfusion will improve 06/09/2017 2018 - Progressing by Kalman Jewels, RN Vascular access site(s) Level 0-1 will be maintained 06/09/2017 2018 - Progressing by Kalman Jewels, RN Health Behavior/Discharge Planning: Ability to safely manage health-related needs after discharge will improve 06/09/2017 2018 - Progressing by Kalman Jewels, RN

## 2017-06-09 NOTE — Progress Notes (Signed)
[employer] - [place] at [facility]                                                                                                                                                                                  Patient Demographics   Cody Burns, is a 52 y.o. male, DOB - February 09, 1966, [id number]:[id number]  Admit date - 06/05/2017   Admitting Physician Auburn Bilberry, MD  Outpatient Primary MD for the patient is [facility]   LOS - 4  Subjective: Patient feeling better very weak, leg pain improved  Review of Systems:   CONSTITUTIONAL: No documented fever. No fatigue, weakness. No weight gain, no weight loss.  EYES: No blurry or double vision.  ENT: No tinnitus. No postnasal drip. No redness of the oropharynx.  RESPIRATORY: No cough, no wheeze, no hemoptysis.  Positive dyspnea.  CARDIOVASCULAR: Positive chest pain. No orthopnea. No palpitations. No syncope.  GASTROINTESTINAL: No nausea, no vomiting or diarrhea. No abdominal pain. No melena or hematochezia.  GENITOURINARY: No dysuria or hematuria.  ENDOCRINE: No polyuria or nocturia. No heat or cold intolerance.  HEMATOLOGY: No anemia. No bruising. No bleeding.  INTEGUMENTARY: No rashes. No lesions.  MUSCULOSKELETAL: No arthritis. No swelling. No gout.  NEUROLOGIC: No numbness, tingling, or ataxia. No seizure-type activity.  PSYCHIATRIC: No anxiety. No insomnia. No ADD.    Vitals:   Vitals:   06/08/17 2011 06/09/17 0604 06/09/17 0807 06/09/17 1600  BP: 126/68 110/79 114/70 127/68  Pulse: 96 100 100 97  Resp: 18 16 18 18   Temp: 98.5 F (36.9 C) (!) 97.3 F (36.3 C) 98.1 F (36.7 C) 97.8 F (36.6 C)  TempSrc: Oral Oral  Oral  SpO2: 99% 99% 99% 100%  Weight:      Height:        Wt Readings from Last 3 Encounters:  06/07/17 232 lb (105.2 kg)  02/23/17 230 lb 9.6 oz (104.6 kg)  09/22/16 238 lb (108 kg)     Intake/Output Summary (Last 24 hours) at 06/09/2017 1652 Last data filed at 06/09/2017  1415 Gross per 24 hour  Intake 840 ml  Output 1650 ml  Net -810 ml    Physical Exam:   GENERAL: Pleasant-appearing in no apparent distress.  HEAD, EYES, EARS, NOSE AND THROAT: Atraumatic, normocephalic. Extraocular muscles are intact. Pupils equal and reactive to light. Sclerae anicteric. No conjunctival injection. No oro-pharyngeal erythema.  NECK: Supple. There is no jugular venous distention. No bruits, no lymphadenopathy, no thyromegaly.  HEART: Regular rate and rhythm,. No murmurs, no rubs, no clicks.  LUNGS: Clear to auscultation bilaterally. No rales or rhonchi. No wheezes.  ABDOMEN: Soft, flat, nontender, nondistended. Has good  bowel sounds. No hepatosplenomegaly appreciated.  EXTREMITIES: No evidence of any cyanosis, clubbing, or peripheral edema.  +2 pedal and radial pulses bilaterally.  NEUROLOGIC: The patient is alert, awake, and oriented x3 with no focal motor or sensory deficits appreciated bilaterally.  SKIN: Bruising under the left leg Psych: Not anxious, depressed LN: No inguinal LN enlargement    Antibiotics   Anti-infectives (From admission, onward)   None      Medications   Scheduled Meds: . aspirin  325 mg Oral Daily  . atorvastatin  80 mg Oral QHS  . buPROPion  300 mg Oral Daily  . calcium-vitamin D  1 tablet Oral Daily  . clopidogrel  75 mg Oral Daily  . cyclobenzaprine  10 mg Oral TID  . Dimethyl Fumarate  240 mg Oral BID  . escitalopram  20 mg Oral Daily  . folic acid  1 mg Oral Daily  . gabapentin  900 mg Oral TID  . levothyroxine  25 mcg Oral QAC breakfast  . lisinopril  20 mg Oral Daily  . montelukast  10 mg Oral Daily  . niacin  500 mg Oral QHS  . QUEtiapine  100 mg Oral QHS  . senna-docusate  2 tablet Oral BID  . sodium chloride flush  3 mL Intravenous Q12H  . sodium chloride flush  3 mL Intravenous Q12H  . cyanocobalamin  1,000 mcg Oral Daily   Continuous Infusions: . sodium chloride     PRN Meds:.sodium chloride, acetaminophen  **OR** acetaminophen, morphine injection, ondansetron **OR** ondansetron (ZOFRAN) IV, oxyCODONE, sodium chloride flush, traMADol, trolamine salicylate   Data Review:   Micro Results No results found for this or any previous visit (from the past 240 hour(s)).  Radiology Reports Ct Head Wo Contrast  Result Date: 06/05/2017 CLINICAL DATA:  Fall without loss of consciousness.  Dizziness. EXAM: CT HEAD WITHOUT CONTRAST TECHNIQUE: Contiguous axial images were obtained from the base of the skull through the vertex without intravenous contrast. COMPARISON:  February 23, 2017 FINDINGS: Brain: No subdural, epidural, or subarachnoid hemorrhage. Cerebellum, brainstem, and basal cisterns are unremarkable. Ventricles and sulci are prominent but stable. Mild white matter changes are stable. No acute cortical ischemia or infarct identified. No mass, mass effect, or midline shift. Vascular: Atherosclerotic vascular calcification of the carotid siphons. Skull: A craniotomy defect on the left is stable. Sinuses/Orbits: No acute finding. Other: None. IMPRESSION: No acute intracranial abnormalities. Electronically Signed   By: Gerome Sam III M.D   On: 06/05/2017 07:11   Ct Cervical Spine Wo Contrast  Result Date: 06/05/2017 CLINICAL DATA:  Neck pain, fall EXAM: CT CERVICAL SPINE WITHOUT CONTRAST TECHNIQUE: Multidetector CT imaging of the cervical spine was performed without intravenous contrast. Multiplanar CT image reconstructions were also generated. COMPARISON:  06/05/2017 FINDINGS: Alignment: Normal Skull base and vertebrae: No fracture Soft tissues and spinal canal: Prevertebral soft tissues are normal. No epidural or paraspinal hematoma. Disc levels: Anterior degenerative spurring at C4-5 and C5-6 and in the upper thoracic spine. Upper chest: No acute findings Other: No acute findings carotid artery calcifications. IMPRESSION: No acute bony abnormality. Electronically Signed   By: Charlett Nose M.D.   On:  06/05/2017 09:57   Dg Chest Port 1 View  Result Date: 06/05/2017 CLINICAL DATA:  Chest pressure since this afternoon. EXAM: PORTABLE CHEST 1 VIEW COMPARISON:  None. FINDINGS: Normal sized heart. Tortuous aorta. Post CABG changes. Clear lungs with normal vascularity. Unremarkable bones. IMPRESSION: No acute abnormality. Electronically Signed   By: Beckie Salts  M.D.   On: 06/05/2017 16:20   Dg Hip Unilat With Pelvis 2-3 Views Left  Result Date: 06/07/2017 CLINICAL DATA:  Left hip pain since a fall 1 week ago Initial encounter. EXAM: DG HIP (WITH OR WITHOUT PELVIS) 2-3V LEFT COMPARISON:  None. 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 The device is located. No fractures identified. Surrounding osseous and soft tissue structures appear normal. IMPRESSION: No acute abnormality. Left hip replacement in place without complicating feature. Electronically Signed   By: Drusilla Kanner M.D.   On: 06/07/2017 10:25     CBC Recent Labs  Lab 06/05/17 0517 06/06/17 0600 06/07/17 0609 06/08/17 0543 06/09/17 0543  WBC 9.9 4.9 6.7 6.8 6.5  HGB 11.9* 9.2* 8.3* 8.0* 8.0*  HCT 35.2* 27.9* 24.5* 23.8* 23.9*  PLT 359 219 248 283 274  MCV 92.2 92.5 91.3 92.5 92.3  MCH 31.1 30.6 31.1 31.0 30.7  MCHC 33.7 33.1 34.1 33.5 33.3  RDW 14.6* 14.6* 14.3 14.5 14.6*    Chemistries  Recent Labs  Lab 06/05/17 0517 06/06/17 0600 06/07/17 0609 06/08/17 0543 06/09/17 0543  NA 136 139 135 135 138  K 3.9 3.5 3.5 3.7 4.2  CL 104 109 101 103 104  CO2 22 24 25 23 25   GLUCOSE 147* 108* 104* 115* 107*  BUN 24* 14 11 10 12   CREATININE 2.37* 1.20 1.23 1.18 1.09  CALCIUM 9.0 8.4* 8.7* 8.8* 9.3  MG 2.1  --   --   --   --    ------------------------------------------------------------------------------------------------------------------ estimated creatinine clearance is 98.2 mL/min (by C-G formula based on SCr of 1.09  mg/dL). ------------------------------------------------------------------------------------------------------------------ No results for input(s): HGBA1C in the last 72 hours. ------------------------------------------------------------------------------------------------------------------ Recent Labs    06/07/17 0609  CHOL 133  HDL 54  LDLCALC 55  TRIG 119  CHOLHDL 2.5   ------------------------------------------------------------------------------------------------------------------ No results for input(s): TSH, T4TOTAL, T3FREE, THYROIDAB in the last 72 hours.  Invalid input(s): FREET3 ------------------------------------------------------------------------------------------------------------------ Recent Labs    06/08/17 0543 06/08/17 0953  VITAMINB12  --  903  FOLATE 15.7  --   FERRITIN 95  --   TIBC 245*  --   IRON 32*  --   RETICCTPCT 4.0*  --     Coagulation profile Recent Labs  Lab 06/05/17 1608  INR 0.80    No results for input(s): DDIMER in the last 72 hours.  Cardiac Enzymes Recent Labs  Lab 06/05/17 1243 06/05/17 1608 06/06/17 0600  TROPONINI 0.22* 0.38* 1.21*   ------------------------------------------------------------------------------------------------------------------ Invalid input(s): POCBNP    Assessment & Plan   Patient is 52 year old admitted with acute renal failure now with chest pain  1.  Acute non-ST MI  S/p two stents Continue therapy with aspirin and Plavix  patient is allergic to metoprolol  2.  Acute renal failure resolved with IV hydration 3.  Acute anemia hemoglobin stable His iron is low I will give him IV iron x1  4. Left leg pain x-rays negative he does have some bruising under the leg PT evaluation 5.  Hyperlipidemia continue Lipitor 6..  Hypothyroidism continue Synthroid     Code Status Orders  (From admission, onward)        Start     Ordered   06/05/17 1012  Full code  Continuous     06/05/17 1011     Code Status History    Date Active Date Inactive Code Status Order ID Comments User Context   02/23/2017 19:08 02/25/2017 19:49 Full Code [id number]  Adrian Saran, MD Inpatient  09/22/2016 02:52 09/22/2016 17:58 Full Code [id number]  Poggi, Excell Seltzer, MD Inpatient    Advance Directive Documentation     Most Recent Value  Type of Advance Directive  Healthcare Power of Attorney  Pre-existing out of facility DNR order (yellow form or pink MOST form)  No data  "MOST" Form in Place?  No data      Patient will need rehab discharge tomorrow     Consults  cards   DVT Prophylaxis heparin  Lab Results  Component Value Date   PLT 274 06/09/2017     Time Spent in minutes   Greater than 50% of time spent in care coordination and counseling patient regarding the condition and plan of care.   Auburn Bilberry M.D on 06/09/2017 at 4:52 PM  Between 7am to 6pm - Pager - [phone]  After 6pm go to [email or web address] - password [email or web address] [facility]  [facility]   Office  [phone]

## 2017-06-09 NOTE — Care Management Important Message (Signed)
Important Message  Patient Details  Name: Cody Burns MRN: [id number] Date of Birth: 06-01-1965   Medicare Important Message Given:  Yes Signed IM notice given    Eber Hong, RN 06/09/2017, 12:27 PM

## 2017-06-10 LAB — BASIC METABOLIC PANEL
ANION GAP: 9 (ref 5–15)
BUN: 15 mg/dL (ref 6–20)
CO2: 26 mmol/L (ref 22–32)
Calcium: 9.4 mg/dL (ref 8.9–10.3)
Chloride: 101 mmol/L (ref 101–111)
Creatinine, Ser: 1.46 mg/dL — ABNORMAL HIGH (ref 0.61–1.24)
GFR calc non Af Amer: 53 mL/min — ABNORMAL LOW (ref >60)
GLUCOSE: 105 mg/dL — AB (ref 65–99)
POTASSIUM: 4 mmol/L (ref 3.5–5.1)
Sodium: 136 mmol/L (ref 135–145)

## 2017-06-10 LAB — CBC
HCT: 24.2 % — ABNORMAL LOW (ref 40.0–52.0)
Hemoglobin: 8.2 g/dL — ABNORMAL LOW (ref 13.0–18.0)
MCH: 31.4 pg (ref 26.0–34.0)
MCHC: 33.8 g/dL (ref 32.0–36.0)
MCV: 93 fL (ref 80.0–100.0)
Platelets: 314 10*3/uL (ref 150–440)
RBC: 2.6 MIL/uL — AB (ref 4.40–5.90)
RDW: 14.8 % — ABNORMAL HIGH (ref 11.5–14.5)
WBC: 6.3 10*3/uL (ref 3.8–10.6)

## 2017-06-10 MED ORDER — SODIUM CHLORIDE 0.9 % IV SOLN
INTRAVENOUS | Status: DC
  Administered 2017-06-10 (×2): via INTRAVENOUS

## 2017-06-10 NOTE — Progress Notes (Signed)
[employer] - [place] [facility]                                                                                                                                                                                  Patient Demographics   Cody Burns, is a 52 y.o. male, DOB - 03-19-1966, ZOX:[id number]  Admit date - 06/05/2017   Admitting Physician Auburn Bilberry, MD  Outpatient Primary MD for the patient is [facility]   LOS - 5  Subjective: Patient has generalized weakness.  Orthostatic hypotension today per RN.  Review of Systems:   CONSTITUTIONAL: No documented fever. Has fatigue, weakness. No weight gain, no weight loss.  EYES: No blurry or double vision.  ENT: No tinnitus. No postnasal drip. No redness of the oropharynx.  RESPIRATORY: No cough, no wheeze, no hemoptysis.  Positive dyspnea.  CARDIOVASCULAR: Positive chest pain. No orthopnea. No palpitations. No syncope.  GASTROINTESTINAL: No nausea, no vomiting or diarrhea. No abdominal pain. No melena or hematochezia.  GENITOURINARY: No dysuria or hematuria.  ENDOCRINE: No polyuria or nocturia. No heat or cold intolerance.  HEMATOLOGY: No anemia. No bruising. No bleeding.  INTEGUMENTARY: No rashes. No lesions.  MUSCULOSKELETAL: No arthritis. No swelling. No gout.  NEUROLOGIC: No numbness, tingling, or ataxia. No seizure-type activity.  PSYCHIATRIC: No anxiety. No insomnia. No ADD.    Vitals:   Vitals:   06/09/17 1943 06/10/17 0620 06/10/17 0743 06/10/17 1739  BP: 126/80 103/62 101/64 (!) 144/84  Pulse: (!) 101 (!) 106 99 (!) 52  Resp: 18 18 18 20   Temp: 98.3 F (36.8 C) 98.7 F (37.1 C) 97.8 F (36.6 C) 98 F (36.7 C)  TempSrc: Oral Oral Oral Oral  SpO2: 99% 98% 97% (!) 87%  Weight:      Height:        Wt Readings from Last 3 Encounters:  06/07/17 232 lb (105.2 kg)  02/23/17 230 lb 9.6 oz (104.6 kg)  09/22/16 238 lb (108 kg)     Intake/Output Summary (Last 24 hours) at 06/10/2017  1852 Last data filed at 06/10/2017 1641 Gross per 24 hour  Intake 1470.5 ml  Output 1575 ml  Net -104.5 ml    Physical Exam:   GENERAL: Pleasant-appearing in no apparent distress.  HEAD, EYES, EARS, NOSE AND THROAT: Atraumatic, normocephalic. Extraocular muscles are intact. Pupils equal and reactive to light. Sclerae anicteric. No conjunctival injection. No oro-pharyngeal erythema.  NECK: Supple. There is no jugular venous distention. No bruits, no lymphadenopathy, no thyromegaly.  HEART: Regular rate and rhythm,. No murmurs, no rubs, no clicks.  LUNGS: Clear to auscultation bilaterally. No rales or rhonchi. No wheezes.  ABDOMEN:  Soft, flat, nontender, nondistended. Has good bowel sounds. No hepatosplenomegaly appreciated.  EXTREMITIES: No evidence of any cyanosis, clubbing, or peripheral edema.  +2 pedal and radial pulses bilaterally.  NEUROLOGIC: The patient is alert, awake, and oriented x3 with no focal motor or sensory deficits appreciated bilaterally.  SKIN: Bruising under the left leg Psych: Not anxious, depressed LN: No inguinal LN enlargement    Antibiotics   Anti-infectives (From admission, onward)   None      Medications   Scheduled Meds: . aspirin  325 mg Oral Daily  . atorvastatin  80 mg Oral QHS  . buPROPion  300 mg Oral Daily  . calcium-vitamin D  1 tablet Oral Daily  . clopidogrel  75 mg Oral Daily  . cyclobenzaprine  10 mg Oral TID  . Dimethyl Fumarate  240 mg Oral BID  . escitalopram  20 mg Oral Daily  . folic acid  1 mg Oral Daily  . gabapentin  900 mg Oral TID  . levothyroxine  25 mcg Oral QAC breakfast  . lisinopril  20 mg Oral Daily  . montelukast  10 mg Oral Daily  . niacin  500 mg Oral QHS  . QUEtiapine  100 mg Oral QHS  . senna-docusate  2 tablet Oral BID  . sodium chloride flush  3 mL Intravenous Q12H  . sodium chloride flush  3 mL Intravenous Q12H  . cyanocobalamin  1,000 mcg Oral Daily   Continuous Infusions: . sodium chloride    .  sodium chloride 75 mL/hr at 06/10/17 0914   PRN Meds:.sodium chloride, acetaminophen **OR** acetaminophen, morphine injection, ondansetron **OR** ondansetron (ZOFRAN) IV, oxyCODONE, sodium chloride flush, traMADol, trolamine salicylate   Data Review:   Micro Results No results found for this or any previous visit (from the past 240 hour(s)).  Radiology Reports Ct Head Wo Contrast  Result Date: 06/05/2017 CLINICAL DATA:  Fall without loss of consciousness.  Dizziness. EXAM: CT HEAD WITHOUT CONTRAST TECHNIQUE: Contiguous axial images were obtained from the base of the skull through the vertex without intravenous contrast. COMPARISON:  February 23, 2017 FINDINGS: Brain: No subdural, epidural, or subarachnoid hemorrhage. Cerebellum, brainstem, and basal cisterns are unremarkable. Ventricles and sulci are prominent but stable. Mild white matter changes are stable. No acute cortical ischemia or infarct identified. No mass, mass effect, or midline shift. Vascular: Atherosclerotic vascular calcification of the carotid siphons. Skull: A craniotomy defect on the left is stable. Sinuses/Orbits: No acute finding. Other: None. IMPRESSION: No acute intracranial abnormalities. Electronically Signed   By: Gerome Sam III M.D   On: 06/05/2017 07:11   Ct Cervical Spine Wo Contrast  Result Date: 06/05/2017 CLINICAL DATA:  Neck pain, fall EXAM: CT CERVICAL SPINE WITHOUT CONTRAST TECHNIQUE: Multidetector CT imaging of the cervical spine was performed without intravenous contrast. Multiplanar CT image reconstructions were also generated. COMPARISON:  06/05/2017 FINDINGS: Alignment: Normal Skull base and vertebrae: No fracture Soft tissues and spinal canal: Prevertebral soft tissues are normal. No epidural or paraspinal hematoma. Disc levels: Anterior degenerative spurring at C4-5 and C5-6 and in the upper thoracic spine. Upper chest: No acute findings Other: No acute findings carotid artery calcifications. IMPRESSION:  No acute bony abnormality. Electronically Signed   By: Charlett Nose M.D.   On: 06/05/2017 09:57   Dg Chest Port 1 View  Result Date: 06/05/2017 CLINICAL DATA:  Chest pressure since this afternoon. EXAM: PORTABLE CHEST 1 VIEW COMPARISON:  None. FINDINGS: Normal sized heart. Tortuous aorta. Post CABG changes. Clear lungs with normal  vascularity. Unremarkable bones. IMPRESSION: No acute abnormality. Electronically Signed   By: Beckie Salts M.D.   On: 06/05/2017 16:20   Dg Hip Unilat With Pelvis 2-3 Views Left  Result Date: 06/07/2017 CLINICAL DATA:  Left hip pain since a fall 1 week ago Initial encounter. EXAM: DG HIP (WITH OR WITHOUT PELVIS) 2-3V LEFT COMPARISON:  None. 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 The device is located. No fractures identified. Surrounding osseous and soft tissue structures appear normal. IMPRESSION: No acute abnormality. Left hip replacement in place without complicating feature. Electronically Signed   By: Drusilla Kanner M.D.   On: 06/07/2017 10:25     CBC Recent Labs  Lab 06/06/17 0600 06/07/17 0609 06/08/17 0543 06/09/17 0543 06/10/17 0424  WBC 4.9 6.7 6.8 6.5 6.3  HGB 9.2* 8.3* 8.0* 8.0* 8.2*  HCT 27.9* 24.5* 23.8* 23.9* 24.2*  PLT 219 248 283 274 314  MCV 92.5 91.3 92.5 92.3 93.0  MCH 30.6 31.1 31.0 30.7 31.4  MCHC 33.1 34.1 33.5 33.3 33.8  RDW 14.6* 14.3 14.5 14.6* 14.8*    Chemistries  Recent Labs  Lab 06/05/17 0517 06/06/17 0600 06/07/17 0609 06/08/17 0543 06/09/17 0543 06/10/17 0424  NA 136 139 135 135 138 136  K 3.9 3.5 3.5 3.7 4.2 4.0  CL 104 109 101 103 104 101  CO2 22 24 25 23 25 26   GLUCOSE 147* 108* 104* 115* 107* 105*  BUN 24* 14 11 10 12 15   CREATININE 2.37* 1.20 1.23 1.18 1.09 1.46*  CALCIUM 9.0 8.4* 8.7* 8.8* 9.3 9.4  MG 2.1  --   --   --   --   --    ------------------------------------------------------------------------------------------------------------------ estimated creatinine clearance is 73.3 mL/min  (A) (by C-G formula based on SCr of 1.46 mg/dL (H)). ------------------------------------------------------------------------------------------------------------------ No results for input(s): HGBA1C in the last 72 hours. ------------------------------------------------------------------------------------------------------------------ No results for input(s): CHOL, HDL, LDLCALC, TRIG, CHOLHDL, LDLDIRECT in the last 72 hours. ------------------------------------------------------------------------------------------------------------------ No results for input(s): TSH, T4TOTAL, T3FREE, THYROIDAB in the last 72 hours.  Invalid input(s): FREET3 ------------------------------------------------------------------------------------------------------------------ Recent Labs    06/08/17 0543 06/08/17 0953  VITAMINB12  --  903  FOLATE 15.7  --   FERRITIN 95  --   TIBC 245*  --   IRON 32*  --   RETICCTPCT 4.0*  --     Coagulation profile Recent Labs  Lab 06/05/17 1608  INR 0.80    No results for input(s): DDIMER in the last 72 hours.  Cardiac Enzymes Recent Labs  Lab 06/05/17 1243 06/05/17 1608 06/06/17 0600  TROPONINI 0.22* 0.38* 1.21*   ------------------------------------------------------------------------------------------------------------------ Invalid input(s): POCBNP    Assessment & Plan   Patient is 52 year old admitted with acute renal failure now with chest pain  1.  Acute non-ST MI  S/p two stents Continue therapy with aspirin and Plavix  patient is allergic to metoprolol  2.  Acute renal failure resolved with IV hydration, creatinine is up to 1.46 today.  Restarted IV fluid and follow-up BMP.  3.  Acute anemia hemoglobin stable Given IV iron x1, Hb is stable at 8.2.  4. Left leg pain x-rays negative 5.  Hyperlipidemia continue Lipitor 6..  Hypothyroidism continue Synthroid  Orthostatic hypotension, start normal saline IV and follow-up BP.  PT  evaluation: SNF.    Code Status Orders  (From admission, onward)        Start     Ordered   06/05/17 1012  Full code  Continuous  06/05/17 1011    Code Status History    Date Active Date Inactive Code Status Order ID Comments User Context   02/23/2017 19:08 02/25/2017 19:49 Full Code [id number]  Adrian Saran, MD Inpatient   09/22/2016 02:52 09/22/2016 17:58 Full Code [id number]  PoggiExcell Seltzer, MD Inpatient    Advance Directive Documentation     Most Recent Value  Type of Advance Directive  Healthcare Power of Attorney  Pre-existing out of facility DNR order (yellow form or pink MOST form)  No data  "MOST" Form in Place?  No data      Patient will need rehab discharge tomorrow     Consults  cards   DVT Prophylaxis heparin  Lab Results  Component Value Date   PLT 314 06/10/2017     Time Spent in minutes   32 min Greater than 50% of time spent in care coordination and counseling patient regarding the condition and plan of care.   Shaune Pollack M.D on 06/10/2017 at 6:52 PM  Between 7am to 6pm - Pager - [phone]  After 6pm go to [email or web address] - password [email or web address] [facility]  [facility] [place] [facility]   Office  [phone]

## 2017-06-10 NOTE — Clinical Social Work Note (Addendum)
CSW presented bed offers to patient and he would like to go to [facility].  CSW spoke to [facility] and they can accept patient tomorrow if he is medically ready for discharge and orders have been received.    Patient currently has his Dimethyl Fumarate CPDR 240 mg for his MS from home here at hospital, bedside nurse has been administering to patient as prescribed.  Patient will have take med with him to SNF once he discharges.    SNF can accept patient as long as this medication goes with him.  CSW updated bedside nurse and physician.  CSW to continue to follow patient's progress throughout discharge planning.  Ervin Knack. Hassan Rowan, MSW, Theresia Majors [phone]  06/10/2017 7:47 PM

## 2017-06-10 NOTE — Progress Notes (Signed)
Physical Therapy Treatment Patient Details Name: Cody Burns MRN: [id number] DOB: 03-May-1966 Today's Date: 06/10/2017    History of Present Illness Pt is a 52 y.o. male presenting to hospital 06/05/17 with fall and dizziness and then reporting chest pain.  Pt admitted to hospital with acute renal failure, hypotension, elevated troponin, and NSTEMI.  Pt s/p cardiac cath 06/07/17 (2 stents).  PMH includes h/o blackouts, htn, MS, optic neuritis, seizures, CABG, R ankle fx May 2018 s/p ORIF, and L THR.    PT Comments    Participated in exercises as described below.  Pt to edge of bed with supervision.  Upon sitting, pt reported being dizzy.  Generally unsteady today in sitting and required min guard +1 for safety.  Dizziness did not improve with time.  Pt returned to supine and BP monitor was obtained.  Supine 124/72 P 109.  Pt stated dizziness was somewhat better but still present.  Returned to sitting to check orthostatic BP.  100/76 P 116 in sitting with increased symptoms.  Pt did not feel comfortable standing to take standing BP so he returned to supine.  Session limited by dizziness.  Primary RN notified.  Pt stated he has taken pain meds about 30 minutes prior.  Will monitor orthostatic BP's next session if it remains a limiting factor in sessions.   Follow Up Recommendations  SNF     Equipment Recommendations  Rolling walker with 5" wheels    Recommendations for Other Services       Precautions / Restrictions Precautions Precautions: Fall Precaution Comments: Check orthostatic vitals next session Restrictions Weight Bearing Restrictions: No    Mobility  Bed Mobility Overal bed mobility: Needs Assistance Bed Mobility: Sit to Supine;Supine to Sit     Supine to sit: Min guard Sit to supine: Min guard   General bed mobility comments: mild increased effort and time to perform; use of bed rail  Transfers                    Ambulation/Gait                  Stairs            Wheelchair Mobility    Modified Rankin (Stroke Patients Only)       Balance Overall balance assessment: Needs assistance Sitting-balance support: Feet supported Sitting balance-Leahy Scale: Fair                                      Cognition Arousal/Alertness: Awake/alert Behavior During Therapy: WFL for tasks assessed/performed Overall Cognitive Status: Within Functional Limits for tasks assessed                                        Exercises Other Exercises Other Exercises: BLE AROM x 10 for ankle pumps, heel slides, SLR, ab/add, and pillow squeeze.    General Comments        Pertinent Vitals/Pain Pain Assessment: 0-10 Pain Score: 4  Pain Location: posterior L leg (bruising noted posterior L leg) Pain Descriptors / Indicators: Tightness;Sore Pain Intervention(s): Limited activity within patient's tolerance;Premedicated before session    Home Living                      Prior Function  PT Goals (current goals can now be found in the care plan section) Progress towards PT goals: Progressing toward goals    Frequency    Min 2X/week      PT Plan Current plan remains appropriate    Co-evaluation              AM-PAC PT "6 Clicks" Daily Activity  Outcome Measure  Difficulty turning over in bed (including adjusting bedclothes, sheets and blankets)?: A Little Difficulty moving from lying on back to sitting on the side of the bed? : A Little Difficulty sitting down on and standing up from a chair with arms (e.g., wheelchair, bedside commode, etc,.)?: Unable Help needed moving to and from a bed to chair (including a wheelchair)?: A Little Help needed walking in hospital room?: A Little Help needed climbing 3-5 steps with a railing? : Total 6 Click Score: 14    End of Session Equipment Utilized During Treatment: Gait belt Activity Tolerance: Treatment limited secondary  to medical complications (Comment) Patient left: in bed;with bed alarm set;with call bell/phone within reach Nurse Communication: Other (comment)       Time: 1610-9604 PT Time Calculation (min) (ACUTE ONLY): 18 min  Charges:  $Therapeutic Exercise: 8-22 mins                    G Codes:       Danielle Dess, PTA 06/10/17, 11:30 AM

## 2017-06-11 DIAGNOSIS — G35 Multiple sclerosis: Secondary | ICD-10-CM | POA: Diagnosis not present

## 2017-06-11 DIAGNOSIS — N179 Acute kidney failure, unspecified: Secondary | ICD-10-CM | POA: Diagnosis not present

## 2017-06-11 DIAGNOSIS — I6521 Occlusion and stenosis of right carotid artery: Secondary | ICD-10-CM | POA: Diagnosis not present

## 2017-06-11 DIAGNOSIS — R531 Weakness: Secondary | ICD-10-CM | POA: Diagnosis not present

## 2017-06-11 DIAGNOSIS — G451 Carotid artery syndrome (hemispheric): Secondary | ICD-10-CM | POA: Diagnosis not present

## 2017-06-11 DIAGNOSIS — I6522 Occlusion and stenosis of left carotid artery: Secondary | ICD-10-CM | POA: Diagnosis not present

## 2017-06-11 DIAGNOSIS — I214 Non-ST elevation (NSTEMI) myocardial infarction: Secondary | ICD-10-CM | POA: Diagnosis not present

## 2017-06-11 DIAGNOSIS — M79605 Pain in left leg: Secondary | ICD-10-CM | POA: Diagnosis not present

## 2017-06-11 DIAGNOSIS — I10 Essential (primary) hypertension: Secondary | ICD-10-CM | POA: Diagnosis not present

## 2017-06-11 DIAGNOSIS — D649 Anemia, unspecified: Secondary | ICD-10-CM | POA: Diagnosis not present

## 2017-06-11 DIAGNOSIS — Z8673 Personal history of transient ischemic attack (TIA), and cerebral infarction without residual deficits: Secondary | ICD-10-CM | POA: Diagnosis not present

## 2017-06-11 DIAGNOSIS — E785 Hyperlipidemia, unspecified: Secondary | ICD-10-CM | POA: Diagnosis not present

## 2017-06-11 DIAGNOSIS — I6529 Occlusion and stenosis of unspecified carotid artery: Secondary | ICD-10-CM | POA: Diagnosis not present

## 2017-06-11 DIAGNOSIS — E7841 Elevated Lipoprotein(a): Secondary | ICD-10-CM | POA: Diagnosis not present

## 2017-06-11 DIAGNOSIS — R55 Syncope and collapse: Secondary | ICD-10-CM | POA: Diagnosis not present

## 2017-06-11 LAB — BASIC METABOLIC PANEL
Anion gap: 8 (ref 5–15)
BUN: 19 mg/dL (ref 6–20)
CO2: 24 mmol/L (ref 22–32)
Calcium: 9.2 mg/dL (ref 8.9–10.3)
Chloride: 104 mmol/L (ref 101–111)
Creatinine, Ser: 1.14 mg/dL (ref 0.61–1.24)
GFR calc Af Amer: >60 mL/min (ref >60)
GFR calc non Af Amer: >60 mL/min (ref >60)
GLUCOSE: 103 mg/dL — AB (ref 65–99)
POTASSIUM: 4.6 mmol/L (ref 3.5–5.1)
Sodium: 136 mmol/L (ref 135–145)

## 2017-06-11 MED ORDER — CYCLOBENZAPRINE HCL 10 MG PO TABS: 10.0000 mg | ORAL_TABLET | Freq: Three times a day (TID) | ORAL | 0 refills | Status: DC

## 2017-06-11 MED ORDER — TRAMADOL HCL 50 MG PO TABS: 50.0000 mg | ORAL_TABLET | Freq: Two times a day (BID) | ORAL | 0 refills | Status: DC | PRN

## 2017-06-11 NOTE — Discharge Summary (Addendum)
[facility] -  at [facility]   PATIENT NAME: Cody Burns    MR#:  [id number]  DATE OF BIRTH:  June 21, 1965  DATE OF ADMISSION:  06/05/2017   ADMITTING PHYSICIAN: Auburn Bilberry, MD  DATE OF DISCHARGE: 06/11/2017  PRIMARY CARE PHYSICIAN: [facility]   ADMISSION DIAGNOSIS:  Orthostatic hypotension [I95.1] Dehydration [E86.0] Dizziness [R42] DISCHARGE DIAGNOSIS:  Active Problems:   Acute renal failure (ARF) (HCC)  SECONDARY DIAGNOSIS:   Past Medical History:  Diagnosis Date  . 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 cause of his ankle break. awaiting neuro workup  . Hypertension   . Hypothyroidism   . Multiple sclerosis (HCC) 1990  . Myocardial infarct (HCC)   . Optic neuritis    x 3. takes steriods and it resolves.  . Seizures (HCC)    as a child. diagnosed with MS and once treated, seizures resolved.  . Sleep apnea    HOSPITAL COURSE:  Patient is 52 year old admitted with acute renal failure now with chest pain  1.  Acute non-ST MI  S/p two stents Continue therapy with aspirin and Plavix  patient is allergic to metoprolol  2.  Acute renal failure resolved with IV hydration, creatinine is up to 1.46,.  Restarted IV fluid and  improved.  3.  Acute anemia hemoglobin stable Given IV iron x1, Hb is stable at 8.2.  4. Left leg pain x-rays negative 5.  Hyperlipidemia continue Lipitor 6..  Hypothyroidism continue Synthroid  Orthostatic hypotension, improved with normal saline IV.  Hold lisinopril and Norvasc.  PT evaluation: SNF. DISCHARGE CONDITIONS:  Stable, discharged to skilled nursing facility today. CONSULTS OBTAINED:  Treatment Team:  Lamar Blinks, MD DRUG ALLERGIES:   Allergies  Allergen Reactions  . Hydrochlorothiazide W-Triamterene Other (See Comments)    High blood pressure  . Metoprolol Other (See Comments)    Exacerbates MS, High blood pressure  . Penicillins Other (See  Comments)    Sores in mouth Has patient had a PCN reaction causing immediate rash, facial/tongue/throat swelling, SOB or lightheadedness with hypotension: No Has patient had a PCN reaction causing severe rash involving mucus membranes or skin necrosis: No Has patient had a PCN reaction that required hospitalization: No Has patient had a PCN reaction occurring within the last 10 years: No If all of the above answers are "NO", then may proceed with Cephalosporin use.    DISCHARGE MEDICATIONS:   Allergies as of 06/11/2017      Reactions   Hydrochlorothiazide W-triamterene Other (See Comments)   High blood pressure   Metoprolol Other (See Comments)   Exacerbates MS, High blood pressure   Penicillins Other (See Comments)   Sores in mouth Has patient had a PCN reaction causing immediate rash, facial/tongue/throat swelling, SOB or lightheadedness with hypotension: No Has patient had a PCN reaction causing severe rash involving mucus membranes or skin necrosis: No Has patient had a PCN reaction that required hospitalization: No Has patient had a PCN reaction occurring within the last 10 years: No If all of the above answers are "NO", then may proceed with Cephalosporin use.      Medication List    STOP taking these medications   amLODipine 10 MG tablet Commonly known as:  NORVASC   lisinopril 20 MG tablet Commonly known as:  PRINIVIL,ZESTRIL   oxyCODONE 5 MG immediate release tablet Commonly known as:  Oxy IR/ROXICODONE     TAKE these medications   aspirin EC 81 MG tablet  Take 81 mg by mouth daily.   atorvastatin 80 MG tablet Commonly known as:  LIPITOR Take 80 mg by mouth at bedtime.   buPROPion 300 MG 24 hr tablet Commonly known as:  WELLBUTRIN XL Take 300 mg by mouth daily.   calcium-vitamin D 500-200 MG-UNIT tablet Commonly known as:  OSCAL WITH D Take 1 tablet by mouth daily.   clopidogrel 75 MG tablet Commonly known as:  PLAVIX Take 75 mg by mouth daily.     cyanocobalamin 1000 MCG tablet Take 1,000 mcg by mouth daily.   cyclobenzaprine 10 MG tablet Commonly known as:  FLEXERIL Take 1 tablet (10 mg total) by mouth 3 (three) times daily.   escitalopram 20 MG tablet Commonly known as:  LEXAPRO Take 20 mg by mouth daily.   folic acid 1 MG tablet Commonly known as:  FOLVITE Take 1 mg by mouth daily.   gabapentin 300 MG capsule Commonly known as:  NEURONTIN Take 900 mg by mouth 3 (three) times daily.   levothyroxine 25 MCG tablet Commonly known as:  SYNTHROID, LEVOTHROID Take 25 mcg by mouth daily before breakfast.   montelukast 10 MG tablet Commonly known as:  SINGULAIR Take 10 mg by mouth daily.   niacin 500 MG CR tablet Commonly known as:  NIASPAN Take 500 mg by mouth at bedtime.   QUEtiapine 100 MG tablet Commonly known as:  SEROQUEL Take 100 mg by mouth at bedtime.   sennosides-docusate sodium 8.6-50 MG tablet Commonly known as:  SENOKOT-S Take 2 tablets by mouth 2 (two) times daily.   TECFIDERA 240 MG Cpdr Generic drug:  Dimethyl Fumarate Take 240 mg by mouth 2 (two) times daily.   traMADol 50 MG tablet Commonly known as:  ULTRAM Take 1 tablet (50 mg total) by mouth 2 (two) times daily as needed for moderate pain or severe pain.        DISCHARGE INSTRUCTIONS:  See AVS.  If you experience worsening of your admission symptoms, develop shortness of breath, life threatening emergency, suicidal or homicidal thoughts you must seek medical attention immediately by calling 911 or calling your MD immediately  if symptoms less severe.  You Must read complete instructions/literature along with all the possible adverse reactions/side effects for all the Medicines you take and that have been prescribed to you. Take any new Medicines after you have completely understood and accpet all the possible adverse reactions/side effects.   Please note  You were cared for by a hospitalist during your hospital stay. If you have any  questions about your discharge medications or the care you received while you were in the hospital after you are discharged, you can call the unit and asked to speak with the hospitalist on call if the hospitalist that took care of you is not available. Once you are discharged, your primary care physician will handle any further medical issues. Please note that NO REFILLS for any discharge medications will be authorized once you are discharged, as it is imperative that you return to your primary care physician (or establish a relationship with a primary care physician if you do not have one) for your aftercare needs so that they can reassess your need for medications and monitor your lab values.    On the day of Discharge:  VITAL SIGNS:  Blood pressure 106/62, pulse 95, temperature 98.2 F (36.8 C), temperature source Oral, resp. rate 18, height 6' (1.829 m), weight 232 lb (105.2 kg), SpO2 100 %. PHYSICAL EXAMINATION:  GENERAL:  52 y.o.-year-old patient lying in the bed with no acute distress.  EYES: Pupils equal, round, reactive to light and accommodation. No scleral icterus. Extraocular muscles intact.  HEENT: Head atraumatic, normocephalic. Oropharynx and nasopharynx clear.  NECK:  Supple, no jugular venous distention. No thyroid enlargement, no tenderness.  LUNGS: Normal breath sounds bilaterally, no wheezing, rales,rhonchi or crepitation. No use of accessory muscles of respiration.  CARDIOVASCULAR: S1, S2 normal. No murmurs, rubs, or gallops.  ABDOMEN: Soft, non-tender, non-distended. Bowel sounds present. No organomegaly or mass.  EXTREMITIES: No pedal edema, cyanosis, or clubbing.  NEUROLOGIC: Cranial nerves II through XII are intact. Muscle strength 4/5 in all extremities. Sensation intact. Gait not checked.  PSYCHIATRIC: The patient is alert and oriented x 3.  SKIN: No obvious rash, lesion, or ulcer.  DATA REVIEW:   CBC Recent Labs  Lab 06/10/17 0424  WBC 6.3  HGB 8.2*  HCT 24.2*   PLT 314    Chemistries  Recent Labs  Lab 06/05/17 0517  06/11/17 0529  NA 136   < > 136  K 3.9   < > 4.6  CL 104   < > 104  CO2 22   < > 24  GLUCOSE 147*   < > 103*  BUN 24*   < > 19  CREATININE 2.37*   < > 1.14  CALCIUM 9.0   < > 9.2  MG 2.1  --   --    < > = values in this interval not displayed.     Microbiology Results  Results for orders placed or performed during the hospital encounter of 09/21/16  Surgical PCR screen     Status: None   Collection Time: 09/22/16  3:05 AM  Result Value Ref Range Status   MRSA, PCR NEGATIVE NEGATIVE Final   Staphylococcus aureus NEGATIVE NEGATIVE Final    Comment:        The Xpert SA Assay (FDA approved for NASAL specimens in patients over 28 years of age), is one component of a comprehensive surveillance program.  Test performance has been validated by [facility] for patients greater than or equal to 30 year old. It is not intended to diagnose infection nor to guide or monitor treatment.     RADIOLOGY:  No results found.   Management plans discussed with the patient, family and they are in agreement.  CODE STATUS: Full Code   TOTAL TIME TAKING CARE OF THIS PATIENT: 33 minutes.    Shaune Pollack M.D on 06/11/2017 at 8:40 AM  Between 7am to 6pm - Pager - [phone]  After 6pm go to [email or web address] - Social research officer, government  [facility] [place] [facility]  Office  [phone]  CC: Primary care physician; [facility]   Note: This dictation was prepared with Dragon dictation along with smaller phrase technology. Any transcriptional errors that result from this process are unintentional.

## 2017-06-11 NOTE — Clinical Social Work Note (Signed)
Patient is discharged to go to [facility] today. Patient is aware and in agreement. Patient's brother is aware and does not feel comfortable transporting patient. Patient is agreeable to go by EMS. Discharge information has been sent to [facility]. Patient is ready for nurse to call report and then EMS.  York Spaniel MSW,LcSW [phone]

## 2017-06-11 NOTE — Progress Notes (Signed)
Patient is discharge o SNF  in a stable condition , report given to floor nurse , was pick up by EMS .

## 2017-06-11 NOTE — Clinical Social Work Placement (Signed)
   CLINICAL SOCIAL WORK PLACEMENT  NOTE  Date:  06/11/2017  Patient Details  Name: Cody Burns MRN: [id number] Date of Birth: 1965-06-27  Clinical Social Work is seeking post-discharge placement for this patient at the Skilled  Nursing Facility level of care (*CSW will initial, date and re-position this form in  chart as items are completed):  Yes   Patient/family provided with [place] Clinical Social Work Department's list of facilities offering this level of care within the geographic area requested by the patient (or if unable, by the patient's family).  Yes   Patient/family informed of their freedom to choose among providers that offer the needed level of care, that participate in Medicare, Medicaid or managed care program needed by the patient, have an available bed and are willing to accept the patient.  Yes   Patient/family informed of [facility]'s ownership interest in [facility] and [facility], as well as of the fact that they are under no obligation to receive care at these facilities.  PASRR submitted to EDS on 06/09/17     PASRR number received on       Existing PASRR number confirmed on 06/09/17     FL2 transmitted to all facilities in geographic area requested by pt/family on       FL2 transmitted to all facilities within larger geographic area on       Patient informed that his/her managed care company has contracts with or will negotiate with certain facilities, including the following:        Yes   Patient/family informed of bed offers received.  Patient chooses bed at [facility])     Physician recommends and patient chooses bed at [facility])    Patient to be transferred to [facility]) on 06/11/17.  Patient to be transferred to facility by (EMS)     Patient family notified on 06/11/17 of transfer.  Name of family member notified:  (brother)     PHYSICIAN Please sign FL2     Additional Comment:     _______________________________________________ York Spaniel, LCSW 06/11/2017, 11:44 AM

## 2017-06-11 NOTE — Clinical Social Work Note (Signed)
Patient's nurse informed CSW that she was obtaining the dimethyl fumarate CPDR 240mg  that patient is to go to [facility] with. York Spaniel MSW,LCSW [phone]

## 2017-06-11 NOTE — Plan of Care (Signed)
  Progressing Education: Knowledge of General Education information will improve 06/11/2017 1103 - Progressing by Tomie China, RN Health Behavior/Discharge Planning: Ability to manage health-related needs will improve 06/11/2017 1103 - Progressing by Tomie China, RN Clinical Measurements: Ability to maintain clinical measurements within normal limits will improve 06/11/2017 1103 - Progressing by Tomie China, RN Will remain free from infection 06/11/2017 1103 - Progressing by Tomie China, RN Diagnostic test results will improve 06/11/2017 1103 - Progressing by Tomie China, RN Respiratory complications will improve 06/11/2017 1103 - Progressing by Tomie China, RN Cardiovascular complication will be avoided 06/11/2017 1103 - Progressing by Tomie China, RN Activity: Risk for activity intolerance will decrease 06/11/2017 1103 - Progressing by Tomie China, RN Nutrition: Adequate nutrition will be maintained 06/11/2017 1103 - Progressing by Tomie China, RN Coping: Level of anxiety will decrease 06/11/2017 1103 - Progressing by Tomie China, RN Elimination: Will not experience complications related to bowel motility 06/11/2017 1103 - Progressing by Tomie China, RN Will not experience complications related to urinary retention 06/11/2017 1103 - Progressing by Tomie China, RN Pain Managment: General experience of comfort will improve 06/11/2017 1103 - Progressing by Tomie China, RN Safety: Ability to remain free from injury will improve 06/11/2017 1103 - Progressing by Tomie China, RN Skin Integrity: Risk for impaired skin integrity will decrease 06/11/2017 1103 - Progressing by Tomie China, RN Education: Understanding of CV disease, CV risk reduction, and recovery process will improve 06/11/2017 1103 -  Progressing by Tomie China, RN Activity: Ability to return to baseline activity level will improve 06/11/2017 1103 - Progressing by Tomie China, RN Cardiovascular: Ability to achieve and maintain adequate cardiovascular perfusion will improve 06/11/2017 1103 - Progressing by Tomie China, RN Vascular access site(s) Level 0-1 will be maintained 06/11/2017 1103 - Progressing by Tomie China, RN Health Behavior/Discharge Planning: Ability to safely manage health-related needs after discharge will improve 06/11/2017 1103 - Progressing by Tomie China, RN Education: Knowledge of disease and its progression will improve 06/11/2017 1103 - Progressing by Tomie China, RN Health Behavior/Discharge Planning: Ability to manage health-related needs will improve 06/11/2017 1103 - Progressing by Tomie China, RN Clinical Measurements: Complications related to the disease process or treatment will be avoided or minimized 06/11/2017 1103 - Progressing by Tomie China, RN Dialysis access will remain free of complications 06/11/2017 1103 - Progressing by Tomie China, RN Activity: Activity intolerance will improve 06/11/2017 1103 - Progressing by Tomie China, RN Fluid Volume: Fluid volume balance will be maintained or improved 06/11/2017 1103 - Progressing by Tomie China, RN Nutritional: Ability to make appropriate dietary choices will improve 06/11/2017 1103 - Progressing by Tomie China, RN Respiratory: Respiratory symptoms related to disease process will be avoided 06/11/2017 1103 - Progressing by Tomie China, RN Self-Concept: Body image disturbance will be avoided or minimized 06/11/2017 1103 - Progressing by Tomie China, RN Urinary Elimination: Progression of disease will be identified and treated 06/11/2017 1103 - Progressing by  Tomie China, RN

## 2017-06-15 DIAGNOSIS — R531 Weakness: Secondary | ICD-10-CM | POA: Diagnosis not present

## 2017-06-15 DIAGNOSIS — I214 Non-ST elevation (NSTEMI) myocardial infarction: Secondary | ICD-10-CM | POA: Diagnosis not present

## 2017-06-15 DIAGNOSIS — I10 Essential (primary) hypertension: Secondary | ICD-10-CM | POA: Diagnosis not present

## 2017-06-16 DIAGNOSIS — G35 Multiple sclerosis: Secondary | ICD-10-CM | POA: Diagnosis not present

## 2017-06-16 DIAGNOSIS — G451 Carotid artery syndrome (hemispheric): Secondary | ICD-10-CM | POA: Diagnosis not present

## 2017-06-16 DIAGNOSIS — I6522 Occlusion and stenosis of left carotid artery: Secondary | ICD-10-CM | POA: Diagnosis not present

## 2017-06-16 DIAGNOSIS — I6521 Occlusion and stenosis of right carotid artery: Secondary | ICD-10-CM | POA: Diagnosis not present

## 2017-06-16 DIAGNOSIS — R55 Syncope and collapse: Secondary | ICD-10-CM | POA: Diagnosis not present

## 2017-06-16 DIAGNOSIS — I10 Essential (primary) hypertension: Secondary | ICD-10-CM | POA: Diagnosis not present

## 2017-06-16 DIAGNOSIS — Z8673 Personal history of transient ischemic attack (TIA), and cerebral infarction without residual deficits: Secondary | ICD-10-CM | POA: Diagnosis not present

## 2017-06-16 DIAGNOSIS — E7841 Elevated Lipoprotein(a): Secondary | ICD-10-CM | POA: Diagnosis not present

## 2017-06-16 DIAGNOSIS — E785 Hyperlipidemia, unspecified: Secondary | ICD-10-CM | POA: Diagnosis not present

## 2017-06-16 DIAGNOSIS — I6529 Occlusion and stenosis of unspecified carotid artery: Secondary | ICD-10-CM | POA: Diagnosis not present

## 2017-06-17 DIAGNOSIS — M79605 Pain in left leg: Secondary | ICD-10-CM | POA: Diagnosis not present

## 2017-07-02 DIAGNOSIS — I6523 Occlusion and stenosis of bilateral carotid arteries: Secondary | ICD-10-CM | POA: Diagnosis not present

## 2017-07-02 DIAGNOSIS — I6602 Occlusion and stenosis of left middle cerebral artery: Secondary | ICD-10-CM | POA: Diagnosis not present

## 2017-07-02 DIAGNOSIS — I6529 Occlusion and stenosis of unspecified carotid artery: Secondary | ICD-10-CM | POA: Diagnosis not present

## 2017-07-08 ENCOUNTER — Encounter: Payer: Self-pay | Admitting: *Deleted

## 2017-07-08 ENCOUNTER — Encounter: Payer: Medicare Other | Attending: Internal Medicine | Admitting: *Deleted

## 2017-07-08 VITALS — Ht 72.0 in | Wt 220.4 lb

## 2017-07-08 DIAGNOSIS — Z48812 Encounter for surgical aftercare following surgery on the circulatory system: Secondary | ICD-10-CM | POA: Insufficient documentation

## 2017-07-08 DIAGNOSIS — I214 Non-ST elevation (NSTEMI) myocardial infarction: Secondary | ICD-10-CM

## 2017-07-08 DIAGNOSIS — Z955 Presence of coronary angioplasty implant and graft: Secondary | ICD-10-CM | POA: Diagnosis not present

## 2017-07-08 NOTE — Progress Notes (Signed)
Daily Session Note  Patient Details  Name: Cody Burns MRN: [id number] Date of Birth: 03/04/1966 Referring Provider:     Cardiac Rehab from 07/08/2017 in [facility]  Referring Provider  [facility]      Encounter Date: 07/08/2017  Check In: Session Check In - 07/08/17 1402      Check-In   Location  [facility]-Cardiac & Pulmonary Rehab    Staff Present  Renita Papa, RN Vickki Hearing, BA, ACSM CEP, Exercise Physiologist;Krista Frederico Hamman, RN BSN    Supervising physician immediately available to respond to emergencies  See telemetry face sheet for immediately available ER MD    Medication changes reported      No    Fall or balance concerns reported     No    Warm-up and Cool-down  Performed as group-led instruction    Resistance Training Performed  Yes    VAD Patient?  No      Pain Assessment   Currently in Pain?  No/denies        Exercise Prescription Changes - 07/08/17 1300      Response to Exercise   Blood Pressure (Admit)  130/80    Blood Pressure (Exercise)  148/84    Blood Pressure (Exit)  134/82    Heart Rate (Admit)  99 bpm    Heart Rate (Exercise)  114 bpm    Heart Rate (Exit)  98 bpm    Oxygen Saturation (Admit)  98 %    Oxygen Saturation (Exit)  100 %    Rating of Perceived Exertion (Exercise)  13       Social History   Tobacco Use  Smoking Status Never Smoker  Smokeless Tobacco Current User  Tobacco Comment   rarely uses smokeless tobacco    Goals Met:  Independence with exercise equipment Exercise tolerated well Personal goals reviewed No report of cardiac concerns or symptoms Strength training completed today  Goals Unmet:  Not Applicable  Comments: Med Review completed   Dr. Emily Filbert is Medical Director for [facility] and [facility].

## 2017-07-08 NOTE — Progress Notes (Signed)
Cardiac Individual Treatment Plan  Patient Details  Name: Cody Burns MRN: [id number] Date of Birth: February 23, 1966 Referring Provider:     Cardiac Rehab from 07/08/2017 in [facility] Cardiac and Pulmonary Rehab  Referring Provider  [facility]      Initial Encounter Date:    Cardiac Rehab from 07/08/2017 in [facility] Cardiac and Pulmonary Rehab  Date  07/08/17  Referring Provider  [facility]      Visit Diagnosis: NSTEMI (non-ST elevated myocardial infarction) [facility])  Status post coronary artery stent placement  Patient's Home Medications on Admission:  Current Outpatient Medications:  .  amLODipine (NORVASC) 5 MG tablet, Take by mouth., Disp: , Rfl:  .  aspirin EC 81 MG tablet, Take 81 mg by mouth daily., Disp: , Rfl:  .  atorvastatin (LIPITOR) 80 MG tablet, Take 80 mg by mouth at bedtime., Disp: , Rfl:  .  buPROPion (WELLBUTRIN XL) 300 MG 24 hr tablet, Take 300 mg by mouth daily., Disp: , Rfl:  .  calcium-vitamin D (OSCAL WITH D) 500-200 MG-UNIT tablet, Take 1 tablet by mouth daily., Disp: , Rfl:  .  clindamycin (CLEOCIN) 150 MG capsule, Take by mouth., Disp: , Rfl:  .  clopidogrel (PLAVIX) 75 MG tablet, Take 75 mg by mouth daily., Disp: , Rfl:  .  cyanocobalamin 1000 MCG tablet, Take 1,000 mcg by mouth daily., Disp: , Rfl:  .  cyclobenzaprine (FLEXERIL) 10 MG tablet, Take 1 tablet (10 mg total) by mouth 3 (three) times daily., Disp: 30 tablet, Rfl: 0 .  Dimethyl Fumarate (TECFIDERA) 240 MG CPDR, Take 240 mg by mouth 2 (two) times daily., Disp: , Rfl:  .  escitalopram (LEXAPRO) 20 MG tablet, Take 20 mg by mouth daily., Disp: , Rfl:  .  folic acid (FOLVITE) 1 MG tablet, Take 1 mg by mouth daily., Disp: , Rfl:  .  gabapentin (NEURONTIN) 300 MG capsule, Take 900 mg by mouth 3 (three) times daily. , Disp: , Rfl:  .  levothyroxine (SYNTHROID, LEVOTHROID) 25 MCG tablet, Take 25 mcg by mouth daily before breakfast. , Disp: , Rfl:  .  lisinopril (PRINIVIL,ZESTRIL) 20 MG tablet, Take by mouth., Disp: ,  Rfl:  .  LORazepam (ATIVAN) 0.5 MG tablet, Take by mouth., Disp: , Rfl:  .  montelukast (SINGULAIR) 10 MG tablet, Take 10 mg by mouth daily. , Disp: , Rfl:  .  niacin (NIASPAN) 500 MG CR tablet, Take 500 mg by mouth at bedtime., Disp: , Rfl:  .  QUEtiapine (SEROQUEL) 100 MG tablet, Take 100 mg by mouth at bedtime., Disp: , Rfl:  .  sennosides-docusate sodium (SENOKOT-S) 8.6-50 MG tablet, Take 2 tablets by mouth 2 (two) times daily., Disp: , Rfl:  .  traMADol (ULTRAM) 50 MG tablet, Take 1 tablet (50 mg total) by mouth 2 (two) times daily as needed for moderate pain or severe pain., Disp: 12 tablet, Rfl: 0  Past Medical History: Past Medical History:  Diagnosis Date  . 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 cause of his ankle break. awaiting neuro workup  . Hypertension   . Hypothyroidism   . Multiple sclerosis ([place]) 1990  . Myocardial infarct ([place])   . Optic neuritis    x 3. takes steriods and it resolves.  . Seizures ([place])    as a child. diagnosed with MS and once treated, seizures resolved.  . Sleep apnea     Tobacco Use: Social History   Tobacco Use  Smoking Status Never Smoker  Smokeless Tobacco Current  User  Tobacco Comment   rarely uses smokeless tobacco    Labs: Recent Review Flowsheet Data    Labs for ITP Cardiac and Pulmonary Rehab Latest Ref Rng & Units 06/07/2017   Cholestrol 0 - 200 mg/dL 133   LDLCALC 0 - 99 mg/dL 55   HDL >40 mg/dL 54   Trlycerides <150 mg/dL 119       Exercise Target Goals: Date: 07/08/17  Exercise Program Goal: Individual exercise prescription set using results from initial 6 min walk test and THRR while considering  patient's activity barriers and safety.   Exercise Prescription Goal: Initial exercise prescription builds to 30-45 minutes a day of aerobic activity, 2-3 days per week.  Home exercise guidelines will be given to patient during program as part of exercise prescription that the participant will  acknowledge.  Activity Barriers & Risk Stratification: Activity Barriers & Cardiac Risk Stratification - 07/08/17 1423      Activity Barriers & Cardiac Risk Stratification   Activity Barriers  Left Hip Replacement;Joint Problems;Muscular Weakness    Cardiac Risk Stratification  Moderate       6 Minute Walk: 6 Minute Walk    Row Name 07/08/17 1419         6 Minute Walk   Distance  1195 feet     Walk Time  6 minutes     # of Rest Breaks  0     MPH  2.26     METS  3.85     RPE  13     Perceived Dyspnea   0     Symptoms  Yes (comment)     Comments  L hip pain 8/10      Resting HR  98 bpm     Resting BP  130/80     Resting Oxygen Saturation   98 %     Exercise Oxygen Saturation  during 6 min walk  100 %     Max Ex. HR  114 bpm     Max Ex. BP  148/84     2 Minute Post BP  134/82        Oxygen Initial Assessment:   Oxygen Re-Evaluation:   Oxygen Discharge (Final Oxygen Re-Evaluation):   Initial Exercise Prescription: Initial Exercise Prescription - 07/08/17 1400      Date of Initial Exercise RX and Referring Provider   Date  07/08/17    Referring Provider  [facility]      Treadmill   MPH  2    Grade  2    Minutes  15    METs  3      Recumbant Bike   Level  3    RPM  60    Watts  50    Minutes  15    METs  3.5      NuStep   Level  4    SPM  80    Minutes  15    METs  3.5      Prescription Details   Frequency (times per week)  3    Duration  Progress to 30 minutes of continuous aerobic without signs/symptoms of physical distress      Intensity   THRR 40-80% of Max Heartrate  125-153    Ratings of Perceived Exertion  11-13    Perceived Dyspnea  0-4      Resistance Training   Training Prescription  Yes    Weight  4 lb    Reps  10-15       Perform Capillary Blood Glucose checks as needed.  Exercise Prescription Changes: Exercise Prescription Changes    Row Name 07/08/17 1300             Response to Exercise   Blood Pressure (Admit)   130/80       Blood Pressure (Exercise)  148/84       Blood Pressure (Exit)  134/82       Heart Rate (Admit)  99 bpm       Heart Rate (Exercise)  114 bpm       Heart Rate (Exit)  98 bpm       Oxygen Saturation (Admit)  98 %       Oxygen Saturation (Exit)  100 %       Rating of Perceived Exertion (Exercise)  13          Exercise Comments:   Exercise Goals and Review: Exercise Goals    Row Name 07/08/17 1418             Exercise Goals   Increase Physical Activity  Yes       Intervention  Provide advice, education, support and counseling about physical activity/exercise needs.;Develop an individualized exercise prescription for aerobic and resistive training based on initial evaluation findings, risk stratification, comorbidities and participant's personal goals.       Expected Outcomes  Short Term: Attend rehab on a regular basis to increase amount of physical activity.;Long Term: Add in home exercise to make exercise part of routine and to increase amount of physical activity.;Long Term: Exercising regularly at least 3-5 days a week.       Increase Strength and Stamina  Yes       Intervention  Provide advice, education, support and counseling about physical activity/exercise needs.;Develop an individualized exercise prescription for aerobic and resistive training based on initial evaluation findings, risk stratification, comorbidities and participant's personal goals.       Expected Outcomes  Short Term: Increase workloads from initial exercise prescription for resistance, speed, and METs.;Short Term: Perform resistance training exercises routinely during rehab and add in resistance training at home;Long Term: Improve cardiorespiratory fitness, muscular endurance and strength as measured by increased METs and functional capacity (6MWT)       Able to understand and use rate of perceived exertion (RPE) scale  Yes       Intervention  Provide education and explanation on how to use RPE scale        Expected Outcomes  Short Term: Able to use RPE daily in rehab to express subjective intensity level;Long Term:  Able to use RPE to guide intensity level when exercising independently       Able to understand and use Dyspnea scale  Yes       Intervention  Provide education and explanation on how to use Dyspnea scale       Expected Outcomes  Short Term: Able to use Dyspnea scale daily in rehab to express subjective sense of shortness of breath during exertion;Long Term: Able to use Dyspnea scale to guide intensity level when exercising independently       Knowledge and understanding of Target Heart Rate Range (THRR)  Yes       Intervention  Provide education and explanation of THRR including how the numbers were predicted and where they are located for reference       Expected Outcomes  Short Term: Able to state/look up THRR;Long Term: Able to use  THRR to govern intensity when exercising independently;Short Term: Able to use daily as guideline for intensity in rehab       Able to check pulse independently  Yes       Intervention  Provide education and demonstration on how to check pulse in carotid and radial arteries.;Review the importance of being able to check your own pulse for safety during independent exercise       Expected Outcomes  Short Term: Able to explain why pulse checking is important during independent exercise;Long Term: Able to check pulse independently and accurately       Understanding of Exercise Prescription  Yes          Exercise Goals Re-Evaluation :   Discharge Exercise Prescription (Final Exercise Prescription Changes): Exercise Prescription Changes - 07/08/17 1300      Response to Exercise   Blood Pressure (Admit)  130/80    Blood Pressure (Exercise)  148/84    Blood Pressure (Exit)  134/82    Heart Rate (Admit)  99 bpm    Heart Rate (Exercise)  114 bpm    Heart Rate (Exit)  98 bpm    Oxygen Saturation (Admit)  98 %    Oxygen Saturation (Exit)  100 %     Rating of Perceived Exertion (Exercise)  13       Nutrition:  Target Goals: Understanding of nutrition guidelines, daily intake of sodium '1500mg'$ , cholesterol '200mg'$ , calories 30% from fat and 7% or less from saturated fats, daily to have 5 or more servings of fruits and vegetables.  Biometrics: Pre Biometrics - 07/08/17 1417      Pre Biometrics   Height  6' (1.829 m)    Weight  220 lb 6.4 oz (100 kg)    Waist Circumference  41 inches    Hip Circumference  44.5 inches    Waist to Hip Ratio  0.92 %    BMI (Calculated)  29.89    Single Leg Stand  1.48 seconds        Nutrition Therapy Plan and Nutrition Goals: Nutrition Therapy & Goals - 07/08/17 1416      Intervention Plan   Intervention  Prescribe, educate and counsel regarding individualized specific dietary modifications aiming towards targeted core components such as weight, hypertension, lipid management, diabetes, heart failure and other comorbidities.;Nutrition handout(s) given to patient.    Expected Outcomes  Short Term Goal: Understand basic principles of dietary content, such as calories, fat, sodium, cholesterol and nutrients.;Short Term Goal: A plan has been developed with personal nutrition goals set during dietitian appointment.;Long Term Goal: Adherence to prescribed nutrition plan.       Nutrition Assessments: Nutrition Assessments - 07/08/17 1417      MEDFICTS Scores   Pre Score  74       Nutrition Goals Re-Evaluation:   Nutrition Goals Discharge (Final Nutrition Goals Re-Evaluation):   Psychosocial: Target Goals: Acknowledge presence or absence of significant depression and/or stress, maximize coping skills, provide positive support system. Participant is able to verbalize types and ability to use techniques and skills needed for reducing stress and depression.   Initial Review & Psychosocial Screening: Initial Psych Review & Screening - 07/08/17 1417      Initial Review   Current issues with   Current Depression;History of Depression;Current Stress Concerns    Source of Stress Concerns  Unable to perform yard/household activities;Chronic Illness      Family Dynamics   Good Support System?  Yes brother  Barriers   Psychosocial barriers to participate in program  There are no identifiable barriers or psychosocial needs.;The patient should benefit from training in stress management and relaxation.      Screening Interventions   Interventions  Provide feedback about the scores to participant;Encouraged to exercise;Program counselor consult;To provide support and resources with identified psychosocial needs    Expected Outcomes  Short Term goal: Utilizing psychosocial counselor, staff and physician to assist with identification of specific Stressors or current issues interfering with healing process. Setting desired goal for each stressor or current issue identified.;Long Term Goal: Stressors or current issues are controlled or eliminated.;Short Term goal: Identification and review with participant of any Quality of Life or Depression concerns found by scoring the questionnaire.;Long Term goal: The participant improves quality of Life and PHQ9 Scores as seen by post scores and/or verbalization of changes       Quality of Life Scores:  Quality of Life - 07/08/17 1419      Quality of Life Scores   Health/Function Pre  20 %    Socioeconomic Pre  25.07 %    Psych/Spiritual Pre  23.21 %    Family Pre  28.3 %    GLOBAL Pre  22.93 %      Scores of 19 and below usually indicate a poorer quality of life in these areas.  A difference of  2-3 points is a clinically meaningful difference.  A difference of 2-3 points in the total score of the Quality of Life Index has been associated with significant improvement in overall quality of life, self-image, physical symptoms, and general health in studies assessing change in quality of life.  PHQ-9: Recent Review Flowsheet Data    Depression  screen [facility] 2/9 07/08/2017   Decreased Interest 2   Down, Depressed, Hopeless 3   PHQ - 2 Score 5   Altered sleeping 0   Tired, decreased energy 3   Change in appetite 3   Feeling bad or failure about yourself  2   Trouble concentrating 0   Moving slowly or fidgety/restless 0   Suicidal thoughts 0   PHQ-9 Score 13   Difficult doing work/chores Very difficult     Interpretation of Total Score  Total Score Depression Severity:  1-4 = Minimal depression, 5-9 = Mild depression, 10-14 = Moderate depression, 15-19 = Moderately severe depression, 20-27 = Severe depression   Psychosocial Evaluation and Intervention:   Psychosocial Re-Evaluation:   Psychosocial Discharge (Final Psychosocial Re-Evaluation):   Vocational Rehabilitation: Provide vocational rehab assistance to qualifying candidates.   Vocational Rehab Evaluation & Intervention: Vocational Rehab - 07/08/17 1422      Initial Vocational Rehab Evaluation & Intervention   Assessment shows need for Vocational Rehabilitation  No       Education: Education Goals: Education classes will be provided on a variety of topics geared toward better understanding of heart health and risk factor modification. Participant will state understanding/return demonstration of topics presented as noted by education test scores.  Learning Barriers/Preferences: Learning Barriers/Preferences - 07/08/17 1420      Learning Barriers/Preferences   Learning Barriers  None    Learning Preferences  Verbal Instruction       Education Topics:  AED/CPR: - Group verbal and written instruction with the use of models to demonstrate the basic use of the AED with the basic ABC's of resuscitation.   General Nutrition Guidelines/Fats and Fiber: -Group instruction provided by verbal, written material, models and posters to present the general guidelines  for heart healthy nutrition. Gives an explanation and review of dietary fats and  fiber.   Controlling Sodium/Reading Food Labels: -Group verbal and written material supporting the discussion of sodium use in heart healthy nutrition. Review and explanation with models, verbal and written materials for utilization of the food label.   Exercise Physiology & General Exercise Guidelines: - Group verbal and written instruction with models to review the exercise physiology of the cardiovascular system and associated critical values. Provides general exercise guidelines with specific guidelines to those with heart or lung disease.    Aerobic Exercise & Resistance Training: - Gives group verbal and written instruction on the various components of exercise. Focuses on aerobic and resistive training programs and the benefits of this training and how to safely progress through these programs..   Flexibility, Balance, Mind/Body Relaxation: Provides group verbal/written instruction on the benefits of flexibility and balance training, including mind/body exercise modes such as yoga, pilates and tai chi.  Demonstration and skill practice provided.   Stress and Anxiety: - Provides group verbal and written instruction about the health risks of elevated stress and causes of high stress.  Discuss the correlation between heart/lung disease and anxiety and treatment options. Review healthy ways to manage with stress and anxiety.   Depression: - Provides group verbal and written instruction on the correlation between heart/lung disease and depressed mood, treatment options, and the stigmas associated with seeking treatment.   Anatomy & Physiology of the Heart: - Group verbal and written instruction and models provide basic cardiac anatomy and physiology, with the coronary electrical and arterial systems. Review of Valvular disease and Heart Failure   Cardiac Procedures: - Group verbal and written instruction to review commonly prescribed medications for heart disease. Reviews the  medication, class of the drug, and side effects. Includes the steps to properly store meds and maintain the prescription regimen. (beta blockers and nitrates)   Cardiac Medications I: - Group verbal and written instruction to review commonly prescribed medications for heart disease. Reviews the medication, class of the drug, and side effects. Includes the steps to properly store meds and maintain the prescription regimen.   Cardiac Medications II: -Group verbal and written instruction to review commonly prescribed medications for heart disease. Reviews the medication, class of the drug, and side effects. (all other drug classes)    Go Sex-Intimacy & Heart Disease, Get SMART - Goal Setting: - Group verbal and written instruction through game format to discuss heart disease and the return to sexual intimacy. Provides group verbal and written material to discuss and apply goal setting through the application of the S.M.A.R.T. Method.   Other Matters of the Heart: - Provides group verbal, written materials and models to describe Stable Angina and Peripheral Artery. Includes description of the disease process and treatment options available to the cardiac patient.   Exercise & Equipment Safety: - Individual verbal instruction and demonstration of equipment use and safety with use of the equipment.   Cardiac Rehab from 07/08/2017 in [facility] Cardiac and Pulmonary Rehab  Date  07/08/17  Educator  [facility]  Instruction Review Code  1- Verbalizes Understanding      Infection Prevention: - Provides verbal and written material to individual with discussion of infection control including proper hand washing and proper equipment cleaning during exercise session.   Cardiac Rehab from 07/08/2017 in [facility] Cardiac and Pulmonary Rehab  Date  07/08/17  Educator  [facility]  Instruction Review Code  1- [employer]  Prevention: - Provides verbal and written material to individual with discussion  of falls prevention and safety.   Cardiac Rehab from 07/08/2017 in [facility] Cardiac and Pulmonary Rehab  Date  07/08/17  Educator  [facility]  Instruction Review Code  1- Verbalizes Understanding      Diabetes: - Individual verbal and written instruction to review signs/symptoms of diabetes, desired ranges of glucose level fasting, after meals and with exercise. Acknowledge that pre and post exercise glucose checks will be done for 3 sessions at entry of program.   Know Your Numbers and Risk Factors: -Group verbal and written instruction about important numbers in your health.  Discussion of what are risk factors and how they play a role in the disease process.  Review of Cholesterol, Blood Pressure, Diabetes, and BMI and the role they play in your overall health.   Sleep Hygiene: -Provides group verbal and written instruction about how sleep can affect your health.  Define sleep hygiene, discuss sleep cycles and impact of sleep habits. Review good sleep hygiene tips.    Other: -Provides group and verbal instruction on various topics (see comments)   Knowledge Questionnaire Score: Knowledge Questionnaire Score - 07/08/17 1421      Knowledge Questionnaire Score   Pre Score  21/28 correct answers reviewed with Herbie Baltimore       Core Components/Risk Factors/Patient Goals at Admission: Personal Goals and Risk Factors at Admission - 07/08/17 1409      Core Components/Risk Factors/Patient Goals on Admission    Weight Management  Yes;Weight Loss    Intervention  Weight Management: Develop a combined nutrition and exercise program designed to reach desired caloric intake, while maintaining appropriate intake of nutrient and fiber, sodium and fats, and appropriate energy expenditure required for the weight goal.;Weight Management: Provide education and appropriate resources to help participant work on and attain dietary goals.;Weight Management/Obesity: Establish reasonable short term and long term weight  goals.    Admit Weight  220 lb (99.8 kg)    Goal Weight: Short Term  215 lb (97.5 kg)    Goal Weight: Long Term  210 lb (95.3 kg)    Expected Outcomes  Short Term: Continue to assess and modify interventions until short term weight is achieved;Long Term: Adherence to nutrition and physical activity/exercise program aimed toward attainment of established weight goal;Weight Maintenance: Understanding of the daily nutrition guidelines, which includes 25-35% calories from fat, 7% or less cal from saturated fats, less than '200mg'$  cholesterol, less than 1.5gm of sodium, & 5 or more servings of fruits and vegetables daily;Weight Loss: Understanding of general recommendations for a balanced deficit meal plan, which promotes 1-2 lb weight loss per week and includes a negative energy balance of [phone] kcal/d;Understanding recommendations for meals to include 15-35% energy as protein, 25-35% energy from fat, 35-60% energy from carbohydrates, less than '200mg'$  of dietary cholesterol, 20-35 gm of total fiber daily;Understanding of distribution of calorie intake throughout the day with the consumption of 4-5 meals/snacks    Hypertension  Yes    Intervention  Provide education on lifestyle modifcations including regular physical activity/exercise, weight management, moderate sodium restriction and increased consumption of fresh fruit, vegetables, and low fat dairy, alcohol moderation, and smoking cessation.;Monitor prescription use compliance.    Expected Outcomes  Short Term: Continued assessment and intervention until BP is < 140/41m HG in hypertensive participants. < 130/865mHG in hypertensive participants with diabetes, heart failure or chronic kidney disease.;Long Term: Maintenance of blood pressure at goal levels.    Stress  Yes Chronic illness (MS) and other health issues. He is also unable to do things around the house he wished he could    Intervention  Offer individual and/or small group education and  counseling on adjustment to heart disease, stress management and health-related lifestyle change. Teach and support self-help strategies.;Refer participants experiencing significant psychosocial distress to appropriate mental health specialists for further evaluation and treatment. When possible, include family members and significant others in education/counseling sessions.    Expected Outcomes  Short Term: Participant demonstrates changes in health-related behavior, relaxation and other stress management skills, ability to obtain effective social support, and compliance with psychotropic medications if prescribed.;Long Term: Emotional wellbeing is indicated by absence of clinically significant psychosocial distress or social isolation.       Core Components/Risk Factors/Patient Goals Review:    Core Components/Risk Factors/Patient Goals at Discharge (Final Review):    ITP Comments: ITP Comments    Row Name 07/08/17 1403           ITP Comments  Med Review completed. Initial ITP created. Diagnosis can be found in [facility] 06/05/17          Comments: Initial ITP

## 2017-07-08 NOTE — Patient Instructions (Signed)
Patient Instructions  Patient Details  Name: Cody Burns MRN: [id number] Date of Birth: November 28, 1965 Referring Provider:  Alwyn Pea, MD  Below are your personal goals for exercise, nutrition, and risk factors. Our goal is to help you stay on track towards obtaining and maintaining these goals. We will be discussing your progress on these goals with you throughout the program.  Initial Exercise Prescription: Initial Exercise Prescription - 07/08/17 1400      Date of Initial Exercise RX and Referring Provider   Date  07/08/17    Referring Provider  [facility]      Treadmill   MPH  2    Grade  2    Minutes  15    METs  3      Recumbant Bike   Level  3    RPM  60    Watts  50    Minutes  15    METs  3.5      NuStep   Level  4    SPM  80    Minutes  15    METs  3.5      Prescription Details   Frequency (times per week)  3    Duration  Progress to 30 minutes of continuous aerobic without signs/symptoms of physical distress      Intensity   THRR 40-80% of Max Heartrate  125-153    Ratings of Perceived Exertion  11-13    Perceived Dyspnea  0-4      Resistance Training   Training Prescription  Yes    Weight  4 lb    Reps  10-15       Exercise Goals: Frequency: Be able to perform aerobic exercise two to three times per week in program working toward 2-5 days per week of home exercise.  Intensity: Work with a perceived exertion of 11 (fairly light) - 15 (hard) while following your exercise prescription.  We will make changes to your prescription with you as you progress through the program.   Duration: Be able to do 30 to 45 minutes of continuous aerobic exercise in addition to a 5 minute warm-up and a 5 minute cool-down routine.   Nutrition Goals: Your personal nutrition goals will be established when you do your nutrition analysis with the dietician.  The following are general nutrition guidelines to follow: Cholesterol < 200mg /day Sodium <  1500mg /day Fiber: Men over 50 yrs - 30 grams per day  Personal Goals: Personal Goals and Risk Factors at Admission - 07/08/17 1409      Core Components/Risk Factors/Patient Goals on Admission    Weight Management  Yes;Weight Loss    Intervention  Weight Management: Develop a combined nutrition and exercise program designed to reach desired caloric intake, while maintaining appropriate intake of nutrient and fiber, sodium and fats, and appropriate energy expenditure required for the weight goal.;Weight Management: Provide education and appropriate resources to help participant work on and attain dietary goals.;Weight Management/Obesity: Establish reasonable short term and long term weight goals.    Admit Weight  220 lb (99.8 kg)    Goal Weight: Short Term  215 lb (97.5 kg)    Goal Weight: Long Term  210 lb (95.3 kg)    Expected Outcomes  Short Term: Continue to assess and modify interventions until short term weight is achieved;Long Term: Adherence to nutrition and physical activity/exercise program aimed toward attainment of established weight goal;Weight Maintenance: Understanding of the daily nutrition guidelines, which includes 25-35% calories from  fat, 7% or less cal from saturated fats, less than 200mg  cholesterol, less than 1.5gm of sodium, & 5 or more servings of fruits and vegetables daily;Weight Loss: Understanding of general recommendations for a balanced deficit meal plan, which promotes 1-2 lb weight loss per week and includes a negative energy balance of [phone] kcal/d;Understanding recommendations for meals to include 15-35% energy as protein, 25-35% energy from fat, 35-60% energy from carbohydrates, less than 200mg  of dietary cholesterol, 20-35 gm of total fiber daily;Understanding of distribution of calorie intake throughout the day with the consumption of 4-5 meals/snacks    Hypertension  Yes    Intervention  Provide education on lifestyle modifcations including regular physical  activity/exercise, weight management, moderate sodium restriction and increased consumption of fresh fruit, vegetables, and low fat dairy, alcohol moderation, and smoking cessation.;Monitor prescription use compliance.    Expected Outcomes  Short Term: Continued assessment and intervention until BP is < 140/63mm HG in hypertensive participants. < 130/90mm HG in hypertensive participants with diabetes, heart failure or chronic kidney disease.;Long Term: Maintenance of blood pressure at goal levels.    Stress  Yes Chronic illness (MS) and other health issues. He is also unable to do things around the house he wished he could    Intervention  Offer individual and/or small group education and counseling on adjustment to heart disease, stress management and health-related lifestyle change. Teach and support self-help strategies.;Refer participants experiencing significant psychosocial distress to appropriate mental health specialists for further evaluation and treatment. When possible, include family members and significant others in education/counseling sessions.    Expected Outcomes  Short Term: Participant demonstrates changes in health-related behavior, relaxation and other stress management skills, ability to obtain effective social support, and compliance with psychotropic medications if prescribed.;Long Term: Emotional wellbeing is indicated by absence of clinically significant psychosocial distress or social isolation.       Tobacco Use Initial Evaluation: Social History   Tobacco Use  Smoking Status Never Smoker  Smokeless Tobacco Current User  Tobacco Comment   rarely uses smokeless tobacco    Exercise Goals and Review: Exercise Goals    Row Name 07/08/17 1418             Exercise Goals   Increase Physical Activity  Yes       Intervention  Provide advice, education, support and counseling about physical activity/exercise needs.;Develop an individualized exercise prescription for  aerobic and resistive training based on initial evaluation findings, risk stratification, comorbidities and participant's personal goals.       Expected Outcomes  Short Term: Attend rehab on a regular basis to increase amount of physical activity.;Long Term: Add in home exercise to make exercise part of routine and to increase amount of physical activity.;Long Term: Exercising regularly at least 3-5 days a week.       Increase Strength and Stamina  Yes       Intervention  Provide advice, education, support and counseling about physical activity/exercise needs.;Develop an individualized exercise prescription for aerobic and resistive training based on initial evaluation findings, risk stratification, comorbidities and participant's personal goals.       Expected Outcomes  Short Term: Increase workloads from initial exercise prescription for resistance, speed, and METs.;Short Term: Perform resistance training exercises routinely during rehab and add in resistance training at home;Long Term: Improve cardiorespiratory fitness, muscular endurance and strength as measured by increased METs and functional capacity ( )       Able to understand and use rate of perceived exertion (RPE) scale  Yes       Intervention  Provide education and explanation on how to use RPE scale       Expected Outcomes  Short Term: Able to use RPE daily in rehab to express subjective intensity level;Long Term:  Able to use RPE to guide intensity level when exercising independently       Able to understand and use Dyspnea scale  Yes       Intervention  Provide education and explanation on how to use Dyspnea scale       Expected Outcomes  Short Term: Able to use Dyspnea scale daily in rehab to express subjective sense of shortness of breath during exertion;Long Term: Able to use Dyspnea scale to guide intensity level when exercising independently       Knowledge and understanding of Target Heart Rate Range (THRR)  Yes        Intervention  Provide education and explanation of THRR including how the numbers were predicted and where they are located for reference       Expected Outcomes  Short Term: Able to state/look up THRR;Long Term: Able to use THRR to govern intensity when exercising independently;Short Term: Able to use daily as guideline for intensity in rehab       Able to check pulse independently  Yes       Intervention  Provide education and demonstration on how to check pulse in carotid and radial arteries.;Review the importance of being able to check your own pulse for safety during independent exercise       Expected Outcomes  Short Term: Able to explain why pulse checking is important during independent exercise;Long Term: Able to check pulse independently and accurately       Understanding of Exercise Prescription  Yes          Copy of goals given to participant.

## 2017-07-14 ENCOUNTER — Ambulatory Visit: Payer: No Typology Code available for payment source | Admitting: Neurology

## 2017-07-14 DIAGNOSIS — I6521 Occlusion and stenosis of right carotid artery: Secondary | ICD-10-CM | POA: Diagnosis not present

## 2017-07-14 DIAGNOSIS — I6523 Occlusion and stenosis of bilateral carotid arteries: Secondary | ICD-10-CM | POA: Diagnosis not present

## 2017-07-14 DIAGNOSIS — I6522 Occlusion and stenosis of left carotid artery: Secondary | ICD-10-CM | POA: Diagnosis not present

## 2017-07-15 DIAGNOSIS — Z955 Presence of coronary angioplasty implant and graft: Secondary | ICD-10-CM

## 2017-07-15 DIAGNOSIS — I214 Non-ST elevation (NSTEMI) myocardial infarction: Secondary | ICD-10-CM

## 2017-07-15 DIAGNOSIS — Z48812 Encounter for surgical aftercare following surgery on the circulatory system: Secondary | ICD-10-CM | POA: Diagnosis not present

## 2017-07-15 NOTE — Progress Notes (Signed)
Daily Session Note  Patient Details  Name: Cody Burns MRN: [id number] Date of Birth: Jun 02, 1965 Referring Provider:     Cardiac Rehab from 07/08/2017 in [facility]  Referring Provider  [facility]      Encounter Date: 07/15/2017  Check In: Session Check In - 07/15/17 0850      Check-In   Location  [facility]-Cardiac & Pulmonary Rehab    Staff Present  Alberteen Sam, MA, RCEP, CCRP, Exercise Physiologist;Amanda Oletta Darter, BA, ACSM CEP, Exercise Physiologist;Carroll Enterkin, RN, BSN    Supervising physician immediately available to respond to emergencies  See telemetry face sheet for immediately available ER MD    Medication changes reported      No    Fall or balance concerns reported     No    Warm-up and Cool-down  Performed on first and last piece of equipment    Resistance Training Performed  Yes    VAD Patient?  No      Pain Assessment   Currently in Pain?  No/denies    Multiple Pain Sites  No          Social History   Tobacco Use  Smoking Status Never Smoker  Smokeless Tobacco Current User  Tobacco Comment   rarely uses smokeless tobacco    Goals Met:  Exercise tolerated well Personal goals reviewed No report of cardiac concerns or symptoms Strength training completed today  Goals Unmet:  Not Applicable  Comments: First full day of exercise!  Patient was oriented to gym and equipment including functions, settings, policies, and procedures.  Patient's individual exercise prescription and treatment plan were reviewed.  All starting workloads were established based on the results of the 6 minute walk test done at initial orientation visit.  The plan for exercise progression was also introduced and progression will be customized based on patient's performance and goals.    Dr. Emily Filbert is Medical Director for [place] and [facility].

## 2017-07-20 ENCOUNTER — Telehealth: Payer: Self-pay

## 2017-07-20 NOTE — Telephone Encounter (Signed)
Cody Burns is not feeling well and cannot make class today

## 2017-07-21 ENCOUNTER — Encounter: Payer: Self-pay | Admitting: *Deleted

## 2017-07-21 DIAGNOSIS — Z955 Presence of coronary angioplasty implant and graft: Secondary | ICD-10-CM

## 2017-07-21 DIAGNOSIS — I214 Non-ST elevation (NSTEMI) myocardial infarction: Secondary | ICD-10-CM

## 2017-07-21 NOTE — Progress Notes (Signed)
Cardiac Individual Treatment Plan  Patient Details  Name: Cody Burns MRN: [id number] Date of Birth: February 23, 1966 Referring Provider:     Cardiac Rehab from 07/08/2017 in [facility] Cardiac and Pulmonary Rehab  Referring Provider  [facility]      Initial Encounter Date:    Cardiac Rehab from 07/08/2017 in [facility] Cardiac and Pulmonary Rehab  Date  07/08/17  Referring Provider  [facility]      Visit Diagnosis: NSTEMI (non-ST elevated myocardial infarction) [facility])  Status post coronary artery stent placement  Patient's Home Medications on Admission:  Current Outpatient Medications:  .  amLODipine (NORVASC) 5 MG tablet, Take by mouth., Disp: , Rfl:  .  aspirin EC 81 MG tablet, Take 81 mg by mouth daily., Disp: , Rfl:  .  atorvastatin (LIPITOR) 80 MG tablet, Take 80 mg by mouth at bedtime., Disp: , Rfl:  .  buPROPion (WELLBUTRIN XL) 300 MG 24 hr tablet, Take 300 mg by mouth daily., Disp: , Rfl:  .  calcium-vitamin D (OSCAL WITH D) 500-200 MG-UNIT tablet, Take 1 tablet by mouth daily., Disp: , Rfl:  .  clindamycin (CLEOCIN) 150 MG capsule, Take by mouth., Disp: , Rfl:  .  clopidogrel (PLAVIX) 75 MG tablet, Take 75 mg by mouth daily., Disp: , Rfl:  .  cyanocobalamin 1000 MCG tablet, Take 1,000 mcg by mouth daily., Disp: , Rfl:  .  cyclobenzaprine (FLEXERIL) 10 MG tablet, Take 1 tablet (10 mg total) by mouth 3 (three) times daily., Disp: 30 tablet, Rfl: 0 .  Dimethyl Fumarate (TECFIDERA) 240 MG CPDR, Take 240 mg by mouth 2 (two) times daily., Disp: , Rfl:  .  escitalopram (LEXAPRO) 20 MG tablet, Take 20 mg by mouth daily., Disp: , Rfl:  .  folic acid (FOLVITE) 1 MG tablet, Take 1 mg by mouth daily., Disp: , Rfl:  .  gabapentin (NEURONTIN) 300 MG capsule, Take 900 mg by mouth 3 (three) times daily. , Disp: , Rfl:  .  levothyroxine (SYNTHROID, LEVOTHROID) 25 MCG tablet, Take 25 mcg by mouth daily before breakfast. , Disp: , Rfl:  .  lisinopril (PRINIVIL,ZESTRIL) 20 MG tablet, Take by mouth., Disp: ,  Rfl:  .  LORazepam (ATIVAN) 0.5 MG tablet, Take by mouth., Disp: , Rfl:  .  montelukast (SINGULAIR) 10 MG tablet, Take 10 mg by mouth daily. , Disp: , Rfl:  .  niacin (NIASPAN) 500 MG CR tablet, Take 500 mg by mouth at bedtime., Disp: , Rfl:  .  QUEtiapine (SEROQUEL) 100 MG tablet, Take 100 mg by mouth at bedtime., Disp: , Rfl:  .  sennosides-docusate sodium (SENOKOT-S) 8.6-50 MG tablet, Take 2 tablets by mouth 2 (two) times daily., Disp: , Rfl:  .  traMADol (ULTRAM) 50 MG tablet, Take 1 tablet (50 mg total) by mouth 2 (two) times daily as needed for moderate pain or severe pain., Disp: 12 tablet, Rfl: 0  Past Medical History: Past Medical History:  Diagnosis Date  . 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 cause of his ankle break. awaiting neuro workup  . Hypertension   . Hypothyroidism   . Multiple sclerosis ([place]) 1990  . Myocardial infarct ([place])   . Optic neuritis    x 3. takes steriods and it resolves.  . Seizures ([place])    as a child. diagnosed with MS and once treated, seizures resolved.  . Sleep apnea     Tobacco Use: Social History   Tobacco Use  Smoking Status Never Smoker  Smokeless Tobacco Current  User  Tobacco Comment   rarely uses smokeless tobacco    Labs: Recent Review Flowsheet Data    Labs for ITP Cardiac and Pulmonary Rehab Latest Ref Rng & Units 06/07/2017   Cholestrol 0 - 200 mg/dL 133   LDLCALC 0 - 99 mg/dL 55   HDL >40 mg/dL 54   Trlycerides <150 mg/dL 119       Exercise Target Goals:    Exercise Program Goal: Individual exercise prescription set using results from initial 6 min walk test and THRR while considering  patient's activity barriers and safety.   Exercise Prescription Goal: Initial exercise prescription builds to 30-45 minutes a day of aerobic activity, 2-3 days per week.  Home exercise guidelines will be given to patient during program as part of exercise prescription that the participant will acknowledge.  Activity  Barriers & Risk Stratification: Activity Barriers & Cardiac Risk Stratification - 07/08/17 1423      Activity Barriers & Cardiac Risk Stratification   Activity Barriers  Left Hip Replacement;Joint Problems;Muscular Weakness    Cardiac Risk Stratification  Moderate       6 Minute Walk: 6 Minute Walk    Row Name 07/08/17 1419         6 Minute Walk   Distance  1195 feet     Walk Time  6 minutes     # of Rest Breaks  0     MPH  2.26     METS  3.85     RPE  13     Perceived Dyspnea   0     Symptoms  Yes (comment)     Comments  L hip pain 8/10      Resting HR  98 bpm     Resting BP  130/80     Resting Oxygen Saturation   98 %     Exercise Oxygen Saturation  during 6 min walk  100 %     Max Ex. HR  114 bpm     Max Ex. BP  148/84     2 Minute Post BP  134/82        Oxygen Initial Assessment:   Oxygen Re-Evaluation:   Oxygen Discharge (Final Oxygen Re-Evaluation):   Initial Exercise Prescription: Initial Exercise Prescription - 07/08/17 1400      Date of Initial Exercise RX and Referring Provider   Date  07/08/17    Referring Provider  [facility]      Treadmill   MPH  2    Grade  2    Minutes  15    METs  3      Recumbant Bike   Level  3    RPM  60    Watts  50    Minutes  15    METs  3.5      NuStep   Level  4    SPM  80    Minutes  15    METs  3.5      Prescription Details   Frequency (times per week)  3    Duration  Progress to 30 minutes of continuous aerobic without signs/symptoms of physical distress      Intensity   THRR 40-80% of Max Heartrate  125-153    Ratings of Perceived Exertion  11-13    Perceived Dyspnea  0-4      Resistance Training   Training Prescription  Yes    Weight  4 lb    Reps  10-15       Perform Capillary Blood Glucose checks as needed.  Exercise Prescription Changes: Exercise Prescription Changes    Row Name 07/08/17 1300             Response to Exercise   Blood Pressure (Admit)  130/80       Blood  Pressure (Exercise)  148/84       Blood Pressure (Exit)  134/82       Heart Rate (Admit)  99 bpm       Heart Rate (Exercise)  114 bpm       Heart Rate (Exit)  98 bpm       Oxygen Saturation (Admit)  98 %       Oxygen Saturation (Exit)  100 %       Rating of Perceived Exertion (Exercise)  13          Exercise Comments: Exercise Comments    Row Name 07/15/17 0851           Exercise Comments  First full day of exercise!  Patient was oriented to gym and equipment including functions, settings, policies, and procedures.  Patient's individual exercise prescription and treatment plan were reviewed.  All starting workloads were established based on the results of the 6 minute walk test done at initial orientation visit.  The plan for exercise progression was also introduced and progression will be customized based on patient's performance and goals.          Exercise Goals and Review: Exercise Goals    Row Name 07/08/17 1418             Exercise Goals   Increase Physical Activity  Yes       Intervention  Provide advice, education, support and counseling about physical activity/exercise needs.;Develop an individualized exercise prescription for aerobic and resistive training based on initial evaluation findings, risk stratification, comorbidities and participant's personal goals.       Expected Outcomes  Short Term: Attend rehab on a regular basis to increase amount of physical activity.;Long Term: Add in home exercise to make exercise part of routine and to increase amount of physical activity.;Long Term: Exercising regularly at least 3-5 days a week.       Increase Strength and Stamina  Yes       Intervention  Provide advice, education, support and counseling about physical activity/exercise needs.;Develop an individualized exercise prescription for aerobic and resistive training based on initial evaluation findings, risk stratification, comorbidities and participant's personal goals.        Expected Outcomes  Short Term: Increase workloads from initial exercise prescription for resistance, speed, and METs.;Short Term: Perform resistance training exercises routinely during rehab and add in resistance training at home;Long Term: Improve cardiorespiratory fitness, muscular endurance and strength as measured by increased METs and functional capacity (6MWT)       Able to understand and use rate of perceived exertion (RPE) scale  Yes       Intervention  Provide education and explanation on how to use RPE scale       Expected Outcomes  Short Term: Able to use RPE daily in rehab to express subjective intensity level;Long Term:  Able to use RPE to guide intensity level when exercising independently       Able to understand and use Dyspnea scale  Yes       Intervention  Provide education and explanation on how to use Dyspnea scale       Expected  Outcomes  Short Term: Able to use Dyspnea scale daily in rehab to express subjective sense of shortness of breath during exertion;Long Term: Able to use Dyspnea scale to guide intensity level when exercising independently       Knowledge and understanding of Target Heart Rate Range (THRR)  Yes       Intervention  Provide education and explanation of THRR including how the numbers were predicted and where they are located for reference       Expected Outcomes  Short Term: Able to state/look up THRR;Long Term: Able to use THRR to govern intensity when exercising independently;Short Term: Able to use daily as guideline for intensity in rehab       Able to check pulse independently  Yes       Intervention  Provide education and demonstration on how to check pulse in carotid and radial arteries.;Review the importance of being able to check your own pulse for safety during independent exercise       Expected Outcomes  Short Term: Able to explain why pulse checking is important during independent exercise;Long Term: Able to check pulse independently and  accurately       Understanding of Exercise Prescription  Yes          Exercise Goals Re-Evaluation : Exercise Goals Re-Evaluation    Row Name 07/15/17 0851             Exercise Goal Re-Evaluation   Exercise Goals Review  Increase Physical Activity;Increase Strength and Stamina;Able to understand and use rate of perceived exertion (RPE) scale;Able to understand and use Dyspnea scale;Knowledge and understanding of Target Heart Rate Range (THRR);Understanding of Exercise Prescription       Comments  Reviewed RPE scale, THR and program prescription with pt today.  Pt voiced understanding and was given a copy of goals to take home.        Expected Outcomes  Short: Use RPE daily to regulate intensity.  Long: Follow program prescription in THR.          Discharge Exercise Prescription (Final Exercise Prescription Changes): Exercise Prescription Changes - 07/08/17 1300      Response to Exercise   Blood Pressure (Admit)  130/80    Blood Pressure (Exercise)  148/84    Blood Pressure (Exit)  134/82    Heart Rate (Admit)  99 bpm    Heart Rate (Exercise)  114 bpm    Heart Rate (Exit)  98 bpm    Oxygen Saturation (Admit)  98 %    Oxygen Saturation (Exit)  100 %    Rating of Perceived Exertion (Exercise)  13       Nutrition:  Target Goals: Understanding of nutrition guidelines, daily intake of sodium <1578m, cholesterol <2031m calories 30% from fat and 7% or less from saturated fats, daily to have 5 or more servings of fruits and vegetables.  Biometrics: Pre Biometrics - 07/08/17 1417      Pre Biometrics   Height  6' (1.829 m)    Weight  220 lb 6.4 oz (100 kg)    Waist Circumference  41 inches    Hip Circumference  44.5 inches    Waist to Hip Ratio  0.92 %    BMI (Calculated)  29.89    Single Leg Stand  1.48 seconds        Nutrition Therapy Plan and Nutrition Goals: Nutrition Therapy & Goals - 07/08/17 1416      Intervention Plan   Intervention  Prescribe,  educate and  counsel regarding individualized specific dietary modifications aiming towards targeted core components such as weight, hypertension, lipid management, diabetes, heart failure and other comorbidities.;Nutrition handout(s) given to patient.    Expected Outcomes  Short Term Goal: Understand basic principles of dietary content, such as calories, fat, sodium, cholesterol and nutrients.;Short Term Goal: A plan has been developed with personal nutrition goals set during dietitian appointment.;Long Term Goal: Adherence to prescribed nutrition plan.       Nutrition Assessments: Nutrition Assessments - 07/08/17 1417      MEDFICTS Scores   Pre Score  74       Nutrition Goals Re-Evaluation:   Nutrition Goals Discharge (Final Nutrition Goals Re-Evaluation):   Psychosocial: Target Goals: Acknowledge presence or absence of significant depression and/or stress, maximize coping skills, provide positive support system. Participant is able to verbalize types and ability to use techniques and skills needed for reducing stress and depression.   Initial Review & Psychosocial Screening: Initial Psych Review & Screening - 07/08/17 1417      Initial Review   Current issues with  Current Depression;History of Depression;Current Stress Concerns    Source of Stress Concerns  Unable to perform yard/household activities;Chronic Illness      Family Dynamics   Good Support System?  Yes brother      Barriers   Psychosocial barriers to participate in program  There are no identifiable barriers or psychosocial needs.;The patient should benefit from training in stress management and relaxation.      Screening Interventions   Interventions  Provide feedback about the scores to participant;Encouraged to exercise;Program counselor consult;To provide support and resources with identified psychosocial needs    Expected Outcomes  Short Term goal: Utilizing psychosocial counselor, staff and physician to assist with  identification of specific Stressors or current issues interfering with healing process. Setting desired goal for each stressor or current issue identified.;Long Term Goal: Stressors or current issues are controlled or eliminated.;Short Term goal: Identification and review with participant of any Quality of Life or Depression concerns found by scoring the questionnaire.;Long Term goal: The participant improves quality of Life and PHQ9 Scores as seen by post scores and/or verbalization of changes       Quality of Life Scores:  Quality of Life - 07/08/17 1419      Quality of Life Scores   Health/Function Pre  20 %    Socioeconomic Pre  25.07 %    Psych/Spiritual Pre  23.21 %    Family Pre  28.3 %    GLOBAL Pre  22.93 %      Scores of 19 and below usually indicate a poorer quality of life in these areas.  A difference of  2-3 points is a clinically meaningful difference.  A difference of 2-3 points in the total score of the Quality of Life Index has been associated with significant improvement in overall quality of life, self-image, physical symptoms, and general health in studies assessing change in quality of life.  PHQ-9: Recent Review Flowsheet Data    Depression screen [facility] 2/9 07/08/2017   Decreased Interest 2   Down, Depressed, Hopeless 3   PHQ - 2 Score 5   Altered sleeping 0   Tired, decreased energy 3   Change in appetite 3   Feeling bad or failure about yourself  2   Trouble concentrating 0   Moving slowly or fidgety/restless 0   Suicidal thoughts 0   PHQ-9 Score 13   Difficult doing work/chores Very  difficult     Interpretation of Total Score  Total Score Depression Severity:  1-4 = Minimal depression, 5-9 = Mild depression, 10-14 = Moderate depression, 15-19 = Moderately severe depression, 20-27 = Severe depression   Psychosocial Evaluation and Intervention:   Psychosocial Re-Evaluation:   Psychosocial Discharge (Final Psychosocial Re-Evaluation):   Vocational  Rehabilitation: Provide vocational rehab assistance to qualifying candidates.   Vocational Rehab Evaluation & Intervention: Vocational Rehab - 07/08/17 1422      Initial Vocational Rehab Evaluation & Intervention   Assessment shows need for Vocational Rehabilitation  No       Education: Education Goals: Education classes will be provided on a variety of topics geared toward better understanding of heart health and risk factor modification. Participant will state understanding/return demonstration of topics presented as noted by education test scores.  Learning Barriers/Preferences: Learning Barriers/Preferences - 07/08/17 1420      Learning Barriers/Preferences   Learning Barriers  None    Learning Preferences  Verbal Instruction       Education Topics:  AED/CPR: - Group verbal and written instruction with the use of models to demonstrate the basic use of the AED with the basic ABC's of resuscitation.   General Nutrition Guidelines/Fats and Fiber: -Group instruction provided by verbal, written material, models and posters to present the general guidelines for heart healthy nutrition. Gives an explanation and review of dietary fats and fiber.   Controlling Sodium/Reading Food Labels: -Group verbal and written material supporting the discussion of sodium use in heart healthy nutrition. Review and explanation with models, verbal and written materials for utilization of the food label.   Exercise Physiology & General Exercise Guidelines: - Group verbal and written instruction with models to review the exercise physiology of the cardiovascular system and associated critical values. Provides general exercise guidelines with specific guidelines to those with heart or lung disease.    Cardiac Rehab from 07/15/2017 in [facility] Cardiac and Pulmonary Rehab  Date  07/15/17  Educator  [facility]  Instruction Review Code  1- Verbalizes Understanding      Aerobic Exercise & Resistance Training: -  Gives group verbal and written instruction on the various components of exercise. Focuses on aerobic and resistive training programs and the benefits of this training and how to safely progress through these programs..   Flexibility, Balance, Mind/Body Relaxation: Provides group verbal/written instruction on the benefits of flexibility and balance training, including mind/body exercise modes such as yoga, pilates and tai chi.  Demonstration and skill practice provided.   Stress and Anxiety: - Provides group verbal and written instruction about the health risks of elevated stress and causes of high stress.  Discuss the correlation between heart/lung disease and anxiety and treatment options. Review healthy ways to manage with stress and anxiety.   Depression: - Provides group verbal and written instruction on the correlation between heart/lung disease and depressed mood, treatment options, and the stigmas associated with seeking treatment.   Anatomy & Physiology of the Heart: - Group verbal and written instruction and models provide basic cardiac anatomy and physiology, with the coronary electrical and arterial systems. Review of Valvular disease and Heart Failure   Cardiac Procedures: - Group verbal and written instruction to review commonly prescribed medications for heart disease. Reviews the medication, class of the drug, and side effects. Includes the steps to properly store meds and maintain the prescription regimen. (beta blockers and nitrates)   Cardiac Medications I: - Group verbal and written instruction to review commonly prescribed medications for  heart disease. Reviews the medication, class of the drug, and side effects. Includes the steps to properly store meds and maintain the prescription regimen.   Cardiac Medications II: -Group verbal and written instruction to review commonly prescribed medications for heart disease. Reviews the medication, class of the drug, and side  effects. (all other drug classes)    Go Sex-Intimacy & Heart Disease, Get SMART - Goal Setting: - Group verbal and written instruction through game format to discuss heart disease and the return to sexual intimacy. Provides group verbal and written material to discuss and apply goal setting through the application of the S.M.A.R.T. Method.   Other Matters of the Heart: - Provides group verbal, written materials and models to describe Stable Angina and Peripheral Artery. Includes description of the disease process and treatment options available to the cardiac patient.   Exercise & Equipment Safety: - Individual verbal instruction and demonstration of equipment use and safety with use of the equipment.   Cardiac Rehab from 07/15/2017 in [facility] Cardiac and Pulmonary Rehab  Date  07/08/17  Educator  [facility]  Instruction Review Code  1- Verbalizes Understanding      Infection Prevention: - Provides verbal and written material to individual with discussion of infection control including proper hand washing and proper equipment cleaning during exercise session.   Cardiac Rehab from 07/15/2017 in [facility] Cardiac and Pulmonary Rehab  Date  07/08/17  Educator  [facility]  Instruction Review Code  1- Verbalizes Understanding      Falls Prevention: - Provides verbal and written material to individual with discussion of falls prevention and safety.   Cardiac Rehab from 07/15/2017 in [facility] Cardiac and Pulmonary Rehab  Date  07/08/17  Educator  [facility]  Instruction Review Code  1- Verbalizes Understanding      Diabetes: - Individual verbal and written instruction to review signs/symptoms of diabetes, desired ranges of glucose level fasting, after meals and with exercise. Acknowledge that pre and post exercise glucose checks will be done for 3 sessions at entry of program.   Know Your Numbers and Risk Factors: -Group verbal and written instruction about important numbers in your health.  Discussion of what are  risk factors and how they play a role in the disease process.  Review of Cholesterol, Blood Pressure, Diabetes, and BMI and the role they play in your overall health.   Sleep Hygiene: -Provides group verbal and written instruction about how sleep can affect your health.  Define sleep hygiene, discuss sleep cycles and impact of sleep habits. Review good sleep hygiene tips.    Other: -Provides group and verbal instruction on various topics (see comments)   Knowledge Questionnaire Score: Knowledge Questionnaire Score - 07/08/17 1421      Knowledge Questionnaire Score   Pre Score  21/28 correct answers reviewed with Cody Burns       Core Components/Risk Factors/Patient Goals at Admission: Personal Goals and Risk Factors at Admission - 07/08/17 1409      Core Components/Risk Factors/Patient Goals on Admission    Weight Management  Yes;Weight Loss    Intervention  Weight Management: Develop a combined nutrition and exercise program designed to reach desired caloric intake, while maintaining appropriate intake of nutrient and fiber, sodium and fats, and appropriate energy expenditure required for the weight goal.;Weight Management: Provide education and appropriate resources to help participant work on and attain dietary goals.;Weight Management/Obesity: Establish reasonable short term and long term weight goals.    Admit Weight  220 lb (99.8 kg)  Goal Weight: Short Term  215 lb (97.5 kg)    Goal Weight: Long Term  210 lb (95.3 kg)    Expected Outcomes  Short Term: Continue to assess and modify interventions until short term weight is achieved;Long Term: Adherence to nutrition and physical activity/exercise program aimed toward attainment of established weight goal;Weight Maintenance: Understanding of the daily nutrition guidelines, which includes 25-35% calories from fat, 7% or less cal from saturated fats, less than 272m cholesterol, less than 1.5gm of sodium, & 5 or more servings of fruits and  vegetables daily;Weight Loss: Understanding of general recommendations for a balanced deficit meal plan, which promotes 1-2 lb weight loss per week and includes a negative energy balance of [phone] kcal/d;Understanding recommendations for meals to include 15-35% energy as protein, 25-35% energy from fat, 35-60% energy from carbohydrates, less than 2049mof dietary cholesterol, 20-35 gm of total fiber daily;Understanding of distribution of calorie intake throughout the day with the consumption of 4-5 meals/snacks    Hypertension  Yes    Intervention  Provide education on lifestyle modifcations including regular physical activity/exercise, weight management, moderate sodium restriction and increased consumption of fresh fruit, vegetables, and low fat dairy, alcohol moderation, and smoking cessation.;Monitor prescription use compliance.    Expected Outcomes  Short Term: Continued assessment and intervention until BP is < 140/9055mG in hypertensive participants. < 130/27m27m in hypertensive participants with diabetes, heart failure or chronic kidney disease.;Long Term: Maintenance of blood pressure at goal levels.    Stress  Yes Chronic illness (MS) and other health issues. He is also unable to do things around the house he wished he could    Intervention  Offer individual and/or small group education and counseling on adjustment to heart disease, stress management and health-related lifestyle change. Teach and support self-help strategies.;Refer participants experiencing significant psychosocial distress to appropriate mental health specialists for further evaluation and treatment. When possible, include family members and significant others in education/counseling sessions.    Expected Outcomes  Short Term: Participant demonstrates changes in health-related behavior, relaxation and other stress management skills, ability to obtain effective social support, and compliance with psychotropic medications if  prescribed.;Long Term: Emotional wellbeing is indicated by absence of clinically significant psychosocial distress or social isolation.       Core Components/Risk Factors/Patient Goals Review:    Core Components/Risk Factors/Patient Goals at Discharge (Final Review):    ITP Comments: ITP Comments    Row Name 07/08/17 1403 07/21/17 [id number]     ITP Comments  Med Review completed. Initial ITP created. Diagnosis can be found in [facility]2/19  30 day review completed. Continue with ITP unless diercted changes per Medical Director. New to program         Comments:

## 2017-07-22 ENCOUNTER — Encounter: Payer: Medicare Other | Admitting: *Deleted

## 2017-07-22 DIAGNOSIS — Z955 Presence of coronary angioplasty implant and graft: Secondary | ICD-10-CM | POA: Diagnosis not present

## 2017-07-22 DIAGNOSIS — Z48812 Encounter for surgical aftercare following surgery on the circulatory system: Secondary | ICD-10-CM | POA: Diagnosis not present

## 2017-07-22 DIAGNOSIS — I214 Non-ST elevation (NSTEMI) myocardial infarction: Secondary | ICD-10-CM

## 2017-07-22 NOTE — Progress Notes (Signed)
Daily Session Note  Patient Details  Name: Cody Burns MRN: [id number] Date of Birth: 1965-10-17 Referring Provider:     Cardiac Rehab from 07/08/2017 in [facility] Cardiac [facility] Pul[facility]  Referring Provider  [facility]      Encounter Date: 07/22/2017  Check In: Session Check In - 07/22/17 0821      Check-In   Location  [facility]-Cardiac & Pulmonary Rehab    Staff Present  Alberteen Sam, MA, RCEP, CCRP, Exercise Physiologist;Amanda Oletta Darter, BA, ACSM CEP, Exercise Physiologist;Carroll Enterkin, RN, BSN    Supervising physician immediately available to respond to emergencies  See telemetry face sheet for immediately available ER MD    Medication changes reported      No    Fall or balance concerns reported     No    Warm-up and Cool-down  Performed on first and last piece of equipment    Resistance Training Performed  Yes    VAD Patient?  No      Pain Assessment   Currently in Pain?  No/denies          Social History   Tobacco Use  Smoking Status Never Smoker  Smokeless Tobacco Current User  Tobacco Comment   rarely uses smokeless tobacco    Goals Met:  Independence with exercise equipment Exercise tolerated well No report of cardiac concerns or symptoms Strength training completed today  Goals Unmet:  Not Applicable  Comments: Pt able to follow exercise prescription today without complaint.  Will continue to monitor for progression. Reviewed home exercise with pt today.  Pt plans to walk and use treadmill for exercise.  Reviewed THR, pulse, RPE, sign and symptoms, NTG use, and when to call 911 or MD.  Also discussed weather considerations and indoor options.  Pt voiced understanding.    Dr. Emily Filbert is Medical Director for [place] [facility].

## 2017-07-26 ENCOUNTER — Other Ambulatory Visit: Payer: Self-pay

## 2017-07-26 ENCOUNTER — Encounter: Payer: Self-pay | Admitting: Emergency Medicine

## 2017-07-26 ENCOUNTER — Emergency Department
Admission: EM | Admit: 2017-07-26 | Discharge: 2017-07-26 | Disposition: A | Discharge status: Home / Self Care | Payer: Medicare Other | Attending: Emergency Medicine | Admitting: Emergency Medicine

## 2017-07-26 ENCOUNTER — Emergency Department: Payer: Medicare Other

## 2017-07-26 DIAGNOSIS — Z7982 Long term (current) use of aspirin: Secondary | ICD-10-CM | POA: Insufficient documentation

## 2017-07-26 DIAGNOSIS — E039 Hypothyroidism, unspecified: Secondary | ICD-10-CM | POA: Insufficient documentation

## 2017-07-26 DIAGNOSIS — S91111A Laceration without foreign body of right great toe without damage to nail, initial encounter: Secondary | ICD-10-CM | POA: Diagnosis not present

## 2017-07-26 DIAGNOSIS — I252 Old myocardial infarction: Secondary | ICD-10-CM | POA: Insufficient documentation

## 2017-07-26 DIAGNOSIS — G35 Multiple sclerosis: Secondary | ICD-10-CM | POA: Diagnosis not present

## 2017-07-26 DIAGNOSIS — Y9389 Activity, other specified: Secondary | ICD-10-CM | POA: Insufficient documentation

## 2017-07-26 DIAGNOSIS — W01198A Fall on same level from slipping, tripping and stumbling with subsequent striking against other object, initial encounter: Secondary | ICD-10-CM | POA: Diagnosis not present

## 2017-07-26 DIAGNOSIS — Z951 Presence of aortocoronary bypass graft: Secondary | ICD-10-CM | POA: Diagnosis not present

## 2017-07-26 DIAGNOSIS — Z96642 Presence of left artificial hip joint: Secondary | ICD-10-CM | POA: Insufficient documentation

## 2017-07-26 DIAGNOSIS — Z7902 Long term (current) use of antithrombotics/antiplatelets: Secondary | ICD-10-CM | POA: Diagnosis not present

## 2017-07-26 DIAGNOSIS — S81812A Laceration without foreign body, left lower leg, initial encounter: Secondary | ICD-10-CM | POA: Diagnosis not present

## 2017-07-26 DIAGNOSIS — Z79899 Other long term (current) drug therapy: Secondary | ICD-10-CM | POA: Diagnosis not present

## 2017-07-26 DIAGNOSIS — S20212A Contusion of left front wall of thorax, initial encounter: Secondary | ICD-10-CM | POA: Diagnosis not present

## 2017-07-26 DIAGNOSIS — M25552 Pain in left hip: Secondary | ICD-10-CM | POA: Diagnosis not present

## 2017-07-26 DIAGNOSIS — Y998 Other external cause status: Secondary | ICD-10-CM | POA: Insufficient documentation

## 2017-07-26 DIAGNOSIS — Y92000 Kitchen of unspecified non-institutional (private) residence as  the place of occurrence of the external cause: Secondary | ICD-10-CM | POA: Diagnosis not present

## 2017-07-26 DIAGNOSIS — S79912A Unspecified injury of left hip, initial encounter: Secondary | ICD-10-CM | POA: Diagnosis not present

## 2017-07-26 MED ORDER — LIDOCAINE-EPINEPHRINE 1 %-1:100000 IJ SOLN
10.0000 mL | Freq: Once | INTRAMUSCULAR | Status: AC
  Administered 2017-07-26: 10 mL via INTRADERMAL
  Filled 2017-07-26: qty 10

## 2017-07-26 MED ORDER — BACITRACIN ZINC 500 UNIT/GM EX OINT
TOPICAL_OINTMENT | CUTANEOUS | Status: AC
  Filled 2017-07-26: qty 0.9

## 2017-07-26 MED ORDER — LIDOCAINE HCL (PF) 1 % IJ SOLN
INTRAMUSCULAR | Status: AC
  Filled 2017-07-26: qty 5

## 2017-07-26 MED ORDER — TETANUS-DIPHTH-ACELL PERTUSSIS 5-2.5-18.5 LF-MCG/0.5 IM SUSP
0.5000 mL | Freq: Once | INTRAMUSCULAR | Status: AC
  Administered 2017-07-26: 0.5 mL via INTRAMUSCULAR
  Filled 2017-07-26: qty 0.5

## 2017-07-26 NOTE — ED Triage Notes (Addendum)
Pt with history of MS, says occasionally his right leg gives out and he falls; tonight he fell into the fridge and fell; when he hit the fridge he knocked off a ceramic flour container that fell on top of him; laceration to lower back, abrasion down right side of his back, abrasion to left shoulder, laceration to left shin, small laceration to top of right great toe, multiple abrasions to arms and legs; deep purple discoloration around left nipple; tender on palpation; also c/o left hip pain, no abrasions noted; history of surgery to same hip; pt says hip pain increased with ambulation; pt says he keeps his cell phone on him and was able to call his brother for help; pt says he was on the floor for 30-40 minutes

## 2017-07-26 NOTE — ED Provider Notes (Signed)
[facility] Emergency Department Provider Note  ____________________________________________   First MD Initiated Contact with Patient 07/26/17 [phone]     (approximate)  I have reviewed the triage vital signs and the nursing notes.   HISTORY  Chief Complaint Fall and Laceration   HPI Cody Burns is a 52 y.o. male who comes to the emergency department with multiple lacerations after a mechanical fall.  He has a past medical history of multiple sclerosis and has had multiple falls recently.  Tonight he was walking towards the refrigerator and he fell forward hit the refrigerator fell backwards in a ceramic flower container fell on top of him.  He has an abrasion to his lower back along with a laceration to his anterior left shin and to his right great toe.  His last tetanus is unknown.  His pain is moderate severity sudden onset slowly improving worse with movement improved with rest.  Past Medical History:  Diagnosis Date  . 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 cause of his ankle break. awaiting neuro workup  . Hypertension   . Hypothyroidism   . Multiple sclerosis (HCC) 1990  . Myocardial infarct (HCC)   . Optic neuritis    x 3. takes steriods and it resolves.  . Seizures (HCC)    as a child. diagnosed with MS and once treated, seizures resolved.  . Sleep apnea     Patient Active Problem List   Diagnosis Date Noted  . Acute renal failure (ARF) (HCC) 06/05/2017  . Acute kidney injury (HCC) 02/23/2017  . Carotid stenosis, asymptomatic, right 11/16/2016  . 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 Hx of ischemic left MCA stroke 11/13/2016  . Hypertension, essential 11/13/2016  . Multiple sclerosis, relapsing-remitting (HCC) 11/13/2016  . Closed trimalleolar fracture of right ankle 09/25/2016  . Closed right ankle fracture 09/22/2016    Past Surgical History:  Procedure Laterality Date  . CORONARY ARTERY BYPASS GRAFT   2015  . CORONARY STENT INTERVENTION N/A 06/07/2017   Procedure: CORONARY STENT INTERVENTION;  Surgeon: Alwyn Pea, MD;  Location: [facility] INVASIVE CV LAB;  Service: Cardiovascular;  Laterality: N/A;  . JOINT REPLACEMENT Left 2007   left hip replacement  . LEFT HEART CATH AND CORS/GRAFTS ANGIOGRAPHY N/A 06/07/2017   Procedure: LEFT HEART CATH AND CORS/GRAFTS ANGIOGRAPHY;  Surgeon: Lamar Blinks, MD;  Location: [facility] INVASIVE CV LAB;  Service: Cardiovascular;  Laterality: N/A;  . ORIF ANKLE FRACTURE Right 09/22/2016   Procedure: OPEN REDUCTION INTERNAL FIXATION (ORIF) ANKLE FRACTURE;  Surgeon: Christena Flake, MD;  Location: [facility] ORS;  Service: Orthopedics;  Laterality: Right;  . quadruple cardiac bypass  2015    Prior to Admission medications   Medication Sig Start Date End Date Taking? Authorizing Provider  amLODipine (NORVASC) 5 MG tablet Take by mouth.    [provider]  aspirin EC 81 MG tablet Take 81 mg by mouth daily.    [provider]  atorvastatin (LIPITOR) 80 MG tablet Take 80 mg by mouth at bedtime.    [provider]  buPROPion (WELLBUTRIN XL) 300 MG 24 hr tablet Take 300 mg by mouth daily.    [provider]  calcium-vitamin D (OSCAL WITH D) 500-200 MG-UNIT tablet Take 1 tablet by mouth daily.    [provider]  clindamycin (CLEOCIN) 150 MG capsule Take by mouth.    [provider]  clopidogrel (PLAVIX) 75 MG tablet Take 75 mg by mouth daily. 11/25/16 11/25/17  [provider]  cyanocobalamin 1000 MCG tablet Take 1,000 mcg by mouth daily.    [provider]  cyclobenzaprine (FLEXERIL) 10 MG tablet Take 1 tablet (10 mg total) by mouth 3 (three) times daily. 06/11/17   Shaune Pollack, MD  Dimethyl Fumarate (TECFIDERA) 240 MG CPDR Take 240 mg by mouth 2 (two) times daily.    [provider]  escitalopram (LEXAPRO) 20 MG tablet Take 20 mg by mouth daily.    [provider]  folic acid (FOLVITE) 1 MG  tablet Take 1 mg by mouth daily. 11/25/16 11/25/17  [provider]  gabapentin (NEURONTIN) 300 MG capsule Take 900 mg by mouth 3 (three) times daily.     [provider]  levothyroxine (SYNTHROID, LEVOTHROID) 25 MCG tablet Take 25 mcg by mouth daily before breakfast.     [provider]  lisinopril (PRINIVIL,ZESTRIL) 20 MG tablet Take by mouth.    [provider]  LORazepam (ATIVAN) 0.5 MG tablet Take by mouth.    [provider]  montelukast (SINGULAIR) 10 MG tablet Take 10 mg by mouth daily.     [provider]  niacin (NIASPAN) 500 MG CR tablet Take 500 mg by mouth at bedtime. 11/24/16 11/24/17  [provider]  QUEtiapine (SEROQUEL) 100 MG tablet Take 100 mg by mouth at bedtime.    [provider]  sennosides-docusate sodium (SENOKOT-S) 8.6-50 MG tablet Take 2 tablets by mouth 2 (two) times daily.    [provider]  traMADol (ULTRAM) 50 MG tablet Take 1 tablet (50 mg total) by mouth 2 (two) times daily as needed for moderate pain or severe pain. 06/11/17   Shaune Pollack, MD    Allergies Hydrochlorothiazide w-triamterene; Metoprolol; and Penicillins  Family History  Problem Relation Age of Onset  . Cancer Mother     Social History Social History   Tobacco Use  . Smoking status: Never Smoker  . Smokeless tobacco: Current User    Types: Snuff  . Tobacco comment: rarely uses smokeless tobacco  Substance Use Topics  . Alcohol use: Yes    Comment: maybe a 6 pack per week  . Drug use: No    Review of Systems Constitutional: No fever/chills Eyes: No visual changes. ENT: No sore throat. Cardiovascular: Denies chest pain. Respiratory: Denies shortness of breath. Gastrointestinal: No abdominal pain.  No nausea, no vomiting.  No diarrhea.  No constipation. Genitourinary: Negative for dysuria. Musculoskeletal: Negative for back pain. Skin: Positive for wounds Neurological: Negative for headaches, focal weakness  or numbness.   ____________________________________________   PHYSICAL EXAM:  VITAL SIGNS: ED Triage Vitals  Enc Vitals Group     BP 07/26/17 0420 (!) 130/99     Pulse Rate 07/26/17 0420 99     Resp 07/26/17 0420 20     Temp 07/26/17 0420 98.9 F (37.2 C)     Temp Source 07/26/17 0420 Oral     SpO2 07/26/17 0420 99 %     Weight 07/26/17 0421 222 lb (100.7 kg)     Height 07/26/17 0421 6' (1.829 m)     Head Circumference --      Peak Flow --      Pain Score 07/26/17 0421 8     Pain Loc --      Pain Edu? --      Excl. in GC? --     Constitutional: Alert and oriented x4 pleasant cooperative speaks in full clear sentences no diaphoresis Eyes: PERRL EOMI. Head:  Atraumatic. Nose: No congestion/rhinnorhea. Mouth/Throat: No trismus Neck: No stridor.   Cardiovascular: Normal rate, regular rhythm. Grossly normal heart sounds.  Good peripheral circulation. Respiratory: Normal respiratory effort.  No retractions. Lungs CTAB and moving good air Gastrointestinal: Soft nontender Musculoskeletal: No lower extremity edema   Neurologic:  Normal speech and language. No gross focal neurologic deficits are appreciated. Skin: 4 cm abrasion to low back it does not require suturing.  3 cm laceration to distal left anterior shin 2 cm laceration to the anterior right great toe Psychiatric: Mood and affect are normal. Speech and behavior are normal.    ____________________________________________   DIFFERENTIAL includes but not limited to  Mechanical fall, dehydration, syncope, laceration ____________________________________________   LABS (all labs ordered are listed, but only abnormal results are displayed)  Labs Reviewed - No data to display   __________________________________________  EKG   ____________________________________________  RADIOLOGY  X-rays reviewed by me with no acute disease ____________________________________________   PROCEDURES  Procedure(s)  performed: Yes  .Marland KitchenLaceration Repair Date/Time: 07/26/2017 6:58 AM Performed by: Merrily Brittle, MD Authorized by: Merrily Brittle, MD   Consent:    Consent obtained:  Verbal   Consent given by:  Patient   Risks discussed:  Infection, pain, retained foreign body, poor cosmetic result and poor wound healing Anesthesia (see MAR for exact dosages):    Anesthesia method:  Local infiltration   Local anesthetic:  Lidocaine 1% WITH epi Laceration details:    Location:  Leg   Leg location:  L lower leg   Length (cm):  5 Repair type:    Repair type:  Simple Pre-procedure details:    Preparation:  Patient was prepped and draped in usual sterile fashion Exploration:    Hemostasis achieved with:  Direct pressure   Wound exploration: entire depth of wound probed and visualized     Contaminated: no   Treatment:    Area cleansed with:  Saline   Amount of cleaning:  Extensive   Irrigation solution:  Sterile saline   Visualized foreign bodies/material removed: no   Skin repair:    Repair method:  Sutures   Suture size:  4-0   Suture material:  Nylon   Suture technique:  Simple interrupted   Number of sutures:  7 Approximation:    Approximation:  Close Post-procedure details:    Dressing:  Sterile dressing   Patient tolerance of procedure:  Tolerated well, no immediate complications .Marland KitchenLaceration Repair Date/Time: 07/26/2017 6:58 AM Performed by: Merrily Brittle, MD Authorized by: Merrily Brittle, MD   Consent:    Consent obtained:  Verbal   Consent given by:  Patient   Risks discussed:  Infection, pain, retained foreign body, poor cosmetic result and poor wound healing Anesthesia (see MAR for exact dosages):    Anesthesia method:  Local infiltration   Local anesthetic:  Lidocaine 1% w/o epi Laceration details:    Location:  Foot   Length (cm):  2 Repair type:    Repair type:  Simple Exploration:    Hemostasis achieved with:  Direct pressure   Wound exploration: entire depth of  wound probed and visualized     Contaminated: no   Treatment:    Area cleansed with:  Saline   Amount of cleaning:  Extensive   Irrigation solution:  Sterile saline   Visualized foreign bodies/material removed: no   Skin repair:    Repair method:  Sutures   Suture size:  4-0   Suture material:  Nylon   Number of sutures:  2 Approximation:    Approximation:  Close Post-procedure details:    Dressing:  Sterile dressing   Patient tolerance of procedure:  Tolerated well, no immediate complications    Critical Care performed: no  Observation: no ____________________________________________   INITIAL IMPRESSION / ASSESSMENT AND PLAN / ED COURSE  Pertinent labs & imaging results that were available during my care of the patient were reviewed by me and considered in my medical decision making (see chart for details).  Fortunately the patient is x-rays are unremarkable.  His history is consistent with mechanical fall.  All his wounds were washed out and appropriately closed with good cosmesis.  Given 10-14-day suture removal.  The patient and his brother verbalized understanding and agreement the plan.  Tetanus has been updated.      ____________________________________________   FINAL CLINICAL IMPRESSION(S) / ED DIAGNOSES  Final diagnoses:  Laceration of left lower extremity, initial encounter      NEW MEDICATIONS STARTED DURING THIS VISIT:  New Prescriptions   No medications on file     Note:  This document was prepared using Dragon voice recognition software and may include unintentional dictation errors.     Merrily Brittle, MD 07/26/17 [phone]

## 2017-07-26 NOTE — ED Notes (Signed)
Dressing applied to left shin and lower back;

## 2017-07-26 NOTE — Discharge Instructions (Signed)
You had 7 stiches placed in your left leg that need to come out in 10-14 days.  Please return to the ED sooner for any concerns whatsoever.  It was a pleasure to take care of you today, and thank you for coming to our emergency department.  If you have any questions or concerns before leaving please ask the nurse to grab me and I'm more than happy to go through your aftercare instructions again.  If you were prescribed any opioid pain medication today such as Norco, Vicodin, Percocet, morphine, hydrocodone, or oxycodone please make sure you do not drive when you are taking this medication as it can alter your ability to drive safely.  If you have any concerns once you are home that you are not improving or are in fact getting worse before you can make it to your follow-up appointment, please do not hesitate to call 911 and come back for further evaluation.  Merrily Brittle, MD  Results for orders placed or performed during the hospital encounter of 06/05/17  Basic metabolic panel  Result Value Ref Range   Sodium 136 135 - 145 mmol/L   Potassium 3.9 3.5 - 5.1 mmol/L   Chloride 104 101 - 111 mmol/L   CO2 22 22 - 32 mmol/L   Glucose, Bld 147 (H) 65 - 99 mg/dL   BUN 24 (H) 6 - 20 mg/dL   Creatinine, Ser 0.98 (H) 0.61 - 1.24 mg/dL   Calcium 9.0 8.9 - 11.9 mg/dL   GFR calc non Af Amer 30 (L) >60 mL/min   GFR calc Af Amer 34 (L) >60 mL/min   Anion gap 10 5 - 15  CBC  Result Value Ref Range   WBC 9.9 3.8 - 10.6 K/uL   RBC 3.82 (L) 4.40 - 5.90 MIL/uL   Hemoglobin 11.9 (L) 13.0 - 18.0 g/dL   HCT 14.7 (L) 82.9 - 56.2 %   MCV 92.2 80.0 - 100.0 fL   MCH 31.1 26.0 - 34.0 pg   MCHC 33.7 32.0 - 36.0 g/dL   RDW 13.0 (H) 86.5 - 78.4 %   Platelets 359 150 - 440 K/uL  Troponin I  Result Value Ref Range   Troponin I 0.07 (HH) <0.03 ng/mL  Magnesium  Result Value Ref Range   Magnesium 2.1 1.7 - 2.4 mg/dL  TSH  Result Value Ref Range   TSH 1.215 0.350 - 4.500 uIU/mL  Troponin I  Result Value Ref  Range   Troponin I 0.22 (HH) <0.03 ng/mL  Troponin I (q 6hr x 3)  Result Value Ref Range   Troponin I 0.38 (HH) <0.03 ng/mL  APTT  Result Value Ref Range   aPTT 24 24 - 36 seconds  Protime-INR  Result Value Ref Range   Prothrombin Time 11.0 (L) 11.4 - 15.2 seconds   INR 0.80   Heparin level (unfractionated)  Result Value Ref Range   Heparin Unfractionated 0.40 0.30 - 0.70 IU/mL  CBC  Result Value Ref Range   WBC 4.9 3.8 - 10.6 K/uL   RBC 3.02 (L) 4.40 - 5.90 MIL/uL   Hemoglobin 9.2 (L) 13.0 - 18.0 g/dL   HCT 69.6 (L) 29.5 - 28.4 %   MCV 92.5 80.0 - 100.0 fL   MCH 30.6 26.0 - 34.0 pg   MCHC 33.1 32.0 - 36.0 g/dL   RDW 13.2 (H) 44.0 - 10.2 %   Platelets 219 150 - 440 K/uL  Basic metabolic panel  Result Value Ref Range  Sodium 139 135 - 145 mmol/L   Potassium 3.5 3.5 - 5.1 mmol/L   Chloride 109 101 - 111 mmol/L   CO2 24 22 - 32 mmol/L   Glucose, Bld 108 (H) 65 - 99 mg/dL   BUN 14 6 - 20 mg/dL   Creatinine, Ser 9.60 0.61 - 1.24 mg/dL   Calcium 8.4 (L) 8.9 - 10.3 mg/dL   GFR calc non Af Amer >60 >60 mL/min   GFR calc Af Amer >60 >60 mL/min   Anion gap 6 5 - 15  Heparin level (unfractionated)  Result Value Ref Range   Heparin Unfractionated 0.61 0.30 - 0.70 IU/mL  Troponin I  Result Value Ref Range   Troponin I 1.21 (HH) <0.03 ng/mL  Heparin level (unfractionated)  Result Value Ref Range   Heparin Unfractionated 0.32 0.30 - 0.70 IU/mL  CBC  Result Value Ref Range   WBC 6.7 3.8 - 10.6 K/uL   RBC 2.68 (L) 4.40 - 5.90 MIL/uL   Hemoglobin 8.3 (L) 13.0 - 18.0 g/dL   HCT 45.4 (L) 09.8 - 11.9 %   MCV 91.3 80.0 - 100.0 fL   MCH 31.1 26.0 - 34.0 pg   MCHC 34.1 32.0 - 36.0 g/dL   RDW 14.7 82.9 - 56.2 %   Platelets 248 150 - 440 K/uL  Basic metabolic panel  Result Value Ref Range   Sodium 135 135 - 145 mmol/L   Potassium 3.5 3.5 - 5.1 mmol/L   Chloride 101 101 - 111 mmol/L   CO2 25 22 - 32 mmol/L   Glucose, Bld 104 (H) 65 - 99 mg/dL   BUN 11 6 - 20 mg/dL    Creatinine, Ser 1.30 0.61 - 1.24 mg/dL   Calcium 8.7 (L) 8.9 - 10.3 mg/dL   GFR calc non Af Amer >60 >60 mL/min   GFR calc Af Amer >60 >60 mL/min   Anion gap 9 5 - 15  Lipid panel  Result Value Ref Range   Cholesterol 133 0 - 200 mg/dL   Triglycerides 865 <784 mg/dL   HDL 54 >69 mg/dL   Total CHOL/HDL Ratio 2.5 RATIO   VLDL 24 0 - 40 mg/dL   LDL Cholesterol 55 0 - 99 mg/dL  Heparin level (unfractionated)  Result Value Ref Range   Heparin Unfractionated <0.10 (L) 0.30 - 0.70 IU/mL  CBC  Result Value Ref Range   WBC 6.8 3.8 - 10.6 K/uL   RBC 2.58 (L) 4.40 - 5.90 MIL/uL   Hemoglobin 8.0 (L) 13.0 - 18.0 g/dL   HCT 62.9 (L) 52.8 - 41.3 %   MCV 92.5 80.0 - 100.0 fL   MCH 31.0 26.0 - 34.0 pg   MCHC 33.5 32.0 - 36.0 g/dL   RDW 24.4 01.0 - 27.2 %   Platelets 283 150 - 440 K/uL  Basic metabolic panel  Result Value Ref Range   Sodium 135 135 - 145 mmol/L   Potassium 3.7 3.5 - 5.1 mmol/L   Chloride 103 101 - 111 mmol/L   CO2 23 22 - 32 mmol/L   Glucose, Bld 115 (H) 65 - 99 mg/dL   BUN 10 6 - 20 mg/dL   Creatinine, Ser 5.36 0.61 - 1.24 mg/dL   Calcium 8.8 (L) 8.9 - 10.3 mg/dL   GFR calc non Af Amer >60 >60 mL/min   GFR calc Af Amer >60 >60 mL/min   Anion gap 9 5 - 15  Vitamin B12  Result Value Ref Range   Vitamin  B-12 903 180 - 914 pg/mL  Folate  Result Value Ref Range   Folate 15.7 >5.9 ng/mL  Iron and TIBC  Result Value Ref Range   Iron 32 (L) 45 - 182 ug/dL   TIBC 161 (L) 096 - 045 ug/dL   Saturation Ratios 13 (L) 17.9 - 39.5 %   UIBC 213 ug/dL  Ferritin  Result Value Ref Range   Ferritin 95 24 - 336 ng/mL  Reticulocytes  Result Value Ref Range   Retic Ct Pct 4.0 (H) 0.4 - 3.1 %   RBC. 2.59 (L) 4.40 - 5.90 MIL/uL   Retic Count, Absolute 103.6 19.0 - 183.0 K/uL  CBC  Result Value Ref Range   WBC 6.5 3.8 - 10.6 K/uL   RBC 2.59 (L) 4.40 - 5.90 MIL/uL   Hemoglobin 8.0 (L) 13.0 - 18.0 g/dL   HCT 40.9 (L) 81.1 - 91.4 %   MCV 92.3 80.0 - 100.0 fL   MCH 30.7 26.0 -  34.0 pg   MCHC 33.3 32.0 - 36.0 g/dL   RDW 78.2 (H) 95.6 - 21.3 %   Platelets 274 150 - 440 K/uL  Basic metabolic panel  Result Value Ref Range   Sodium 138 135 - 145 mmol/L   Potassium 4.2 3.5 - 5.1 mmol/L   Chloride 104 101 - 111 mmol/L   CO2 25 22 - 32 mmol/L   Glucose, Bld 107 (H) 65 - 99 mg/dL   BUN 12 6 - 20 mg/dL   Creatinine, Ser 0.86 0.61 - 1.24 mg/dL   Calcium 9.3 8.9 - 57.8 mg/dL   GFR calc non Af Amer >60 >60 mL/min   GFR calc Af Amer >60 >60 mL/min   Anion gap 9 5 - 15  CBC  Result Value Ref Range   WBC 6.3 3.8 - 10.6 K/uL   RBC 2.60 (L) 4.40 - 5.90 MIL/uL   Hemoglobin 8.2 (L) 13.0 - 18.0 g/dL   HCT 46.9 (L) 62.9 - 52.8 %   MCV 93.0 80.0 - 100.0 fL   MCH 31.4 26.0 - 34.0 pg   MCHC 33.8 32.0 - 36.0 g/dL   RDW 41.3 (H) 24.4 - 01.0 %   Platelets 314 150 - 440 K/uL  Basic metabolic panel  Result Value Ref Range   Sodium 136 135 - 145 mmol/L   Potassium 4.0 3.5 - 5.1 mmol/L   Chloride 101 101 - 111 mmol/L   CO2 26 22 - 32 mmol/L   Glucose, Bld 105 (H) 65 - 99 mg/dL   BUN 15 6 - 20 mg/dL   Creatinine, Ser 2.72 (H) 0.61 - 1.24 mg/dL   Calcium 9.4 8.9 - 53.6 mg/dL   GFR calc non Af Amer 53 (L) >60 mL/min   GFR calc Af Amer >60 >60 mL/min   Anion gap 9 5 - 15  Basic metabolic panel  Result Value Ref Range   Sodium 136 135 - 145 mmol/L   Potassium 4.6 3.5 - 5.1 mmol/L   Chloride 104 101 - 111 mmol/L   CO2 24 22 - 32 mmol/L   Glucose, Bld 103 (H) 65 - 99 mg/dL   BUN 19 6 - 20 mg/dL   Creatinine, Ser 6.44 0.61 - 1.24 mg/dL   Calcium 9.2 8.9 - 03.4 mg/dL   GFR calc non Af Amer >60 >60 mL/min   GFR calc Af Amer >60 >60 mL/min   Anion gap 8 5 - 15  POCT Activated clotting time  Result Value Ref  Range   Activated Clotting Time 340 seconds   Dg Chest 2 View  Result Date: 07/26/2017 CLINICAL DATA:  Fall with left chest bruising. Abrasion to right back. EXAM: CHEST  2 VIEW COMPARISON:  Radiograph 06/05/2017 FINDINGS: Post median sternotomy. Normal heart size and  mediastinal contours. No pulmonary edema. A skin fold projects over the lower right hemithorax. No pneumothorax. No focal airspace disease or pleural effusion. No visualized rib fracture. IMPRESSION: No acute abnormality or evidence of acute traumatic injury. Electronically Signed   By: Rubye Oaks M.D.   On: 07/26/2017 05:00   Dg Hip Unilat With Pelvis 2-3 Views Left  Result Date: 07/26/2017 CLINICAL DATA:  Fall tonight.  Left hip pain, worse with movement. EXAM: DG HIP (WITH OR WITHOUT PELVIS) 2-3V LEFT COMPARISON:  Radiographs 06/07/2017 FINDINGS: Intact left hip arthroplasty unchanged in alignment. No periprosthetic lucency or fracture. Remainder the bony pelvis is intact including the pubic rami. Pubic symphysis and sacroiliac joints are congruent. Unchanged phleboliths in the pelvis. IMPRESSION: No pelvic fracture. Intact left hip arthroplasty without complication. Electronically Signed   By: Rubye Oaks M.D.   On: 07/26/2017 04:56

## 2017-07-27 ENCOUNTER — Encounter: Payer: Medicare Other | Attending: Internal Medicine

## 2017-07-27 ENCOUNTER — Ambulatory Visit: Payer: Medicare Other | Admitting: Neurology

## 2017-07-27 DIAGNOSIS — I214 Non-ST elevation (NSTEMI) myocardial infarction: Secondary | ICD-10-CM | POA: Insufficient documentation

## 2017-07-27 DIAGNOSIS — Z48812 Encounter for surgical aftercare following surgery on the circulatory system: Secondary | ICD-10-CM | POA: Insufficient documentation

## 2017-07-27 DIAGNOSIS — Z955 Presence of coronary angioplasty implant and graft: Secondary | ICD-10-CM | POA: Insufficient documentation

## 2017-08-10 ENCOUNTER — Telehealth: Payer: Self-pay | Admitting: *Deleted

## 2017-08-10 ENCOUNTER — Encounter: Payer: Self-pay | Admitting: *Deleted

## 2017-08-10 DIAGNOSIS — I214 Non-ST elevation (NSTEMI) myocardial infarction: Secondary | ICD-10-CM

## 2017-08-10 DIAGNOSIS — Z955 Presence of coronary angioplasty implant and graft: Secondary | ICD-10-CM

## 2017-08-10 NOTE — Telephone Encounter (Signed)
Called to check on Cody Burns after his fall.  He is feeling better and all healed up. He is hoping to return to rehab next week.

## 2017-08-11 ENCOUNTER — Encounter: Payer: Self-pay | Admitting: Neurology

## 2017-08-11 ENCOUNTER — Ambulatory Visit: Payer: Medicare Other | Admitting: Neurology

## 2017-08-18 ENCOUNTER — Encounter: Payer: Self-pay | Admitting: *Deleted

## 2017-08-18 DIAGNOSIS — Z955 Presence of coronary angioplasty implant and graft: Secondary | ICD-10-CM

## 2017-08-18 DIAGNOSIS — I214 Non-ST elevation (NSTEMI) myocardial infarction: Secondary | ICD-10-CM

## 2017-08-18 NOTE — Progress Notes (Signed)
Cardiac Individual Treatment Plan  Patient Details  Name: Cody Burns MRN: [id number] Date of Birth: February 23, 1966 Referring Provider:     Cardiac Rehab from 07/08/2017 in [facility] Cardiac and Pulmonary Rehab  Referring Provider  [facility]      Initial Encounter Date:    Cardiac Rehab from 07/08/2017 in [facility] Cardiac and Pulmonary Rehab  Date  07/08/17  Referring Provider  [facility]      Visit Diagnosis: NSTEMI (non-ST elevated myocardial infarction) [facility])  Status post coronary artery stent placement  Patient's Home Medications on Admission:  Current Outpatient Medications:  .  amLODipine (NORVASC) 5 MG tablet, Take by mouth., Disp: , Rfl:  .  aspirin EC 81 MG tablet, Take 81 mg by mouth daily., Disp: , Rfl:  .  atorvastatin (LIPITOR) 80 MG tablet, Take 80 mg by mouth at bedtime., Disp: , Rfl:  .  buPROPion (WELLBUTRIN XL) 300 MG 24 hr tablet, Take 300 mg by mouth daily., Disp: , Rfl:  .  calcium-vitamin D (OSCAL WITH D) 500-200 MG-UNIT tablet, Take 1 tablet by mouth daily., Disp: , Rfl:  .  clindamycin (CLEOCIN) 150 MG capsule, Take by mouth., Disp: , Rfl:  .  clopidogrel (PLAVIX) 75 MG tablet, Take 75 mg by mouth daily., Disp: , Rfl:  .  cyanocobalamin 1000 MCG tablet, Take 1,000 mcg by mouth daily., Disp: , Rfl:  .  cyclobenzaprine (FLEXERIL) 10 MG tablet, Take 1 tablet (10 mg total) by mouth 3 (three) times daily., Disp: 30 tablet, Rfl: 0 .  Dimethyl Fumarate (TECFIDERA) 240 MG CPDR, Take 240 mg by mouth 2 (two) times daily., Disp: , Rfl:  .  escitalopram (LEXAPRO) 20 MG tablet, Take 20 mg by mouth daily., Disp: , Rfl:  .  folic acid (FOLVITE) 1 MG tablet, Take 1 mg by mouth daily., Disp: , Rfl:  .  gabapentin (NEURONTIN) 300 MG capsule, Take 900 mg by mouth 3 (three) times daily. , Disp: , Rfl:  .  levothyroxine (SYNTHROID, LEVOTHROID) 25 MCG tablet, Take 25 mcg by mouth daily before breakfast. , Disp: , Rfl:  .  lisinopril (PRINIVIL,ZESTRIL) 20 MG tablet, Take by mouth., Disp: ,  Rfl:  .  LORazepam (ATIVAN) 0.5 MG tablet, Take by mouth., Disp: , Rfl:  .  montelukast (SINGULAIR) 10 MG tablet, Take 10 mg by mouth daily. , Disp: , Rfl:  .  niacin (NIASPAN) 500 MG CR tablet, Take 500 mg by mouth at bedtime., Disp: , Rfl:  .  QUEtiapine (SEROQUEL) 100 MG tablet, Take 100 mg by mouth at bedtime., Disp: , Rfl:  .  sennosides-docusate sodium (SENOKOT-S) 8.6-50 MG tablet, Take 2 tablets by mouth 2 (two) times daily., Disp: , Rfl:  .  traMADol (ULTRAM) 50 MG tablet, Take 1 tablet (50 mg total) by mouth 2 (two) times daily as needed for moderate pain or severe pain., Disp: 12 tablet, Rfl: 0  Past Medical History: Past Medical History:  Diagnosis Date  . 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 cause of his ankle break. awaiting neuro workup  . Hypertension   . Hypothyroidism   . Multiple sclerosis ([place]) 1990  . Myocardial infarct ([place])   . Optic neuritis    x 3. takes steriods and it resolves.  . Seizures ([place])    as a child. diagnosed with MS and once treated, seizures resolved.  . Sleep apnea     Tobacco Use: Social History   Tobacco Use  Smoking Status Never Smoker  Smokeless Tobacco Current  User  . Types: Snuff  Tobacco Comment   rarely uses smokeless tobacco    Labs: Recent Review Flowsheet Data    Labs for ITP Cardiac and Pulmonary Rehab Latest Ref Rng & Units 06/07/2017   Cholestrol 0 - 200 mg/dL 133   LDLCALC 0 - 99 mg/dL 55   HDL >40 mg/dL 54   Trlycerides <150 mg/dL 119       Exercise Target Goals:    Exercise Program Goal: Individual exercise prescription set using results from initial 6 min walk test and THRR while considering  patient's activity barriers and safety.   Exercise Prescription Goal: Initial exercise prescription builds to 30-45 minutes a day of aerobic activity, 2-3 days per week.  Home exercise guidelines will be given to patient during program as part of exercise prescription that the participant will  acknowledge.  Activity Barriers & Risk Stratification: Activity Barriers & Cardiac Risk Stratification - 07/08/17 1423      Activity Barriers & Cardiac Risk Stratification   Activity Barriers  Left Hip Replacement;Joint Problems;Muscular Weakness    Cardiac Risk Stratification  Moderate       6 Minute Walk: 6 Minute Walk    Row Name 07/08/17 1419         6 Minute Walk   Distance  1195 feet     Walk Time  6 minutes     # of Rest Breaks  0     MPH  2.26     METS  3.85     RPE  13     Perceived Dyspnea   0     Symptoms  Yes (comment)     Comments  L hip pain 8/10      Resting HR  98 bpm     Resting BP  130/80     Resting Oxygen Saturation   98 %     Exercise Oxygen Saturation  during 6 min walk  100 %     Max Ex. HR  114 bpm     Max Ex. BP  148/84     2 Minute Post BP  134/82        Oxygen Initial Assessment:   Oxygen Re-Evaluation:   Oxygen Discharge (Final Oxygen Re-Evaluation):   Initial Exercise Prescription: Initial Exercise Prescription - 07/08/17 1400      Date of Initial Exercise RX and Referring Provider   Date  07/08/17    Referring Provider  [facility]      Treadmill   MPH  2    Grade  2    Minutes  15    METs  3      Recumbant Bike   Level  3    RPM  60    Watts  50    Minutes  15    METs  3.5      NuStep   Level  4    SPM  80    Minutes  15    METs  3.5      Prescription Details   Frequency (times per week)  3    Duration  Progress to 30 minutes of continuous aerobic without signs/symptoms of physical distress      Intensity   THRR 40-80% of Max Heartrate  125-153    Ratings of Perceived Exertion  11-13    Perceived Dyspnea  0-4      Resistance Training   Training Prescription  Yes    Weight  4 lb  Reps  10-15       Perform Capillary Blood Glucose checks as needed.  Exercise Prescription Changes: Exercise Prescription Changes    Row Name 07/08/17 1300 07/22/17 0900 07/28/17 0800         Response to Exercise    Blood Pressure (Admit)  130/80  -  116/72     Blood Pressure (Exercise)  148/84  -  128/82     Blood Pressure (Exit)  134/82  -  112/62     Heart Rate (Admit)  99 bpm  -  104 bpm     Heart Rate (Exercise)  114 bpm  -  130 bpm     Heart Rate (Exit)  98 bpm  -  109 bpm     Oxygen Saturation (Admit)  98 %  -  -     Oxygen Saturation (Exit)  100 %  -  -     Rating of Perceived Exertion (Exercise)  13  -  13     Symptoms  -  -  none     Comments  -  -  second full day of exercise     Duration  -  -  Continue with 30 min of aerobic exercise without signs/symptoms of physical distress.     Intensity  -  -  THRR unchanged       Progression   Progression  -  -  Continue to progress workloads to maintain intensity without signs/symptoms of physical distress.     Average METs  -  -  3.14       Resistance Training   Training Prescription  -  -  Yes     Weight  -  -  4 lb     Reps  -  -  10-15       Interval Training   Interval Training  -  -  No       Treadmill   MPH  -  -  2     Grade  -  -  2     Minutes  -  -  15     METs  -  -  3.08       Recumbant Bike   Level  -  -  5     Watts  -  -  30     Minutes  -  -  15     METs  -  -  2.94       NuStep   Level  -  -  4     Minutes  -  -  15     METs  -  -  3.4       Home Exercise Plan   Plans to continue exercise at  -  Home (comment) walking, treadmill  Home (comment) walking, treadmill     Frequency  -  Add 3 additional days to program exercise sessions.  Add 3 additional days to program exercise sessions.     Initial Home Exercises Provided  -  07/22/17  07/22/17        Exercise Comments: Exercise Comments    Row Name 07/15/17 [phone]           Exercise Comments  First full day of exercise!  Patient was oriented to gym and equipment including functions, settings, policies, and procedures.  Patient's individual exercise prescription and treatment plan were reviewed.  All starting workloads were established based  on the results  of the 6 minute walk test done at initial orientation visit.  The plan for exercise progression was also introduced and progression will be customized based on patient's performance and goals.          Exercise Goals and Review: Exercise Goals    Row Name 07/08/17 1418             Exercise Goals   Increase Physical Activity  Yes       Intervention  Provide advice, education, support and counseling about physical activity/exercise needs.;Develop an individualized exercise prescription for aerobic and resistive training based on initial evaluation findings, risk stratification, comorbidities and participant's personal goals.       Expected Outcomes  Short Term: Attend rehab on a regular basis to increase amount of physical activity.;Long Term: Add in home exercise to make exercise part of routine and to increase amount of physical activity.;Long Term: Exercising regularly at least 3-5 days a week.       Increase Strength and Stamina  Yes       Intervention  Provide advice, education, support and counseling about physical activity/exercise needs.;Develop an individualized exercise prescription for aerobic and resistive training based on initial evaluation findings, risk stratification, comorbidities and participant's personal goals.       Expected Outcomes  Short Term: Increase workloads from initial exercise prescription for resistance, speed, and METs.;Short Term: Perform resistance training exercises routinely during rehab and add in resistance training at home;Long Term: Improve cardiorespiratory fitness, muscular endurance and strength as measured by increased METs and functional capacity (6MWT)       Able to understand and use rate of perceived exertion (RPE) scale  Yes       Intervention  Provide education and explanation on how to use RPE scale       Expected Outcomes  Short Term: Able to use RPE daily in rehab to express subjective intensity level;Long Term:  Able to use RPE to guide  intensity level when exercising independently       Able to understand and use Dyspnea scale  Yes       Intervention  Provide education and explanation on how to use Dyspnea scale       Expected Outcomes  Short Term: Able to use Dyspnea scale daily in rehab to express subjective sense of shortness of breath during exertion;Long Term: Able to use Dyspnea scale to guide intensity level when exercising independently       Knowledge and understanding of Target Heart Rate Range (THRR)  Yes       Intervention  Provide education and explanation of THRR including how the numbers were predicted and where they are located for reference       Expected Outcomes  Short Term: Able to state/look up THRR;Long Term: Able to use THRR to govern intensity when exercising independently;Short Term: Able to use daily as guideline for intensity in rehab       Able to check pulse independently  Yes       Intervention  Provide education and demonstration on how to check pulse in carotid and radial arteries.;Review the importance of being able to check your own pulse for safety during independent exercise       Expected Outcomes  Short Term: Able to explain why pulse checking is important during independent exercise;Long Term: Able to check pulse independently and accurately       Understanding of Exercise Prescription  Yes  Exercise Goals Re-Evaluation : Exercise Goals Re-Evaluation    Row Name 07/15/17 0851 07/22/17 0921 07/28/17 0843 08/10/17 1043       Exercise Goal Re-Evaluation   Exercise Goals Review  Increase Physical Activity;Increase Strength and Stamina;Able to understand and use rate of perceived exertion (RPE) scale;Able to understand and use Dyspnea scale;Knowledge and understanding of Target Heart Rate Range (THRR);Understanding of Exercise Prescription  Increase Physical Activity;Understanding of Exercise Prescription;Increase Strength and Stamina;Knowledge and understanding of Target Heart Rate  Range (THRR);Able to understand and use rate of perceived exertion (RPE) scale;Able to check pulse independently  Increase Physical Activity;Understanding of Exercise Prescription;Increase Strength and Stamina  -    Comments  Reviewed RPE scale, THR and program prescription with pt today.  Pt voiced understanding and was given a copy of goals to take home.   Reviewed home exercise with pt today.  Pt plans to walk and use treadmill for exercise.  Reviewed THR, pulse, RPE, sign and symptoms, NTG use, and when to call 911 or MD.  Also discussed weather considerations and indoor options.  Pt voiced understanding.  Cody Burns has been doing well in rehab.  He has completed two full days of exercise.  We will continue to monitor his progress.   Out since last review    Expected Outcomes  Short: Use RPE daily to regulate intensity.  Long: Follow program prescription in THR.  Short: Start walking and using treadmill at home.  Long: Continue to exericse more to increase activity.   Short: Return to rehab regularly.  Long: Continue to work to build strength and stamina.   -       Discharge Exercise Prescription (Final Exercise Prescription Changes): Exercise Prescription Changes - 07/28/17 0800      Response to Exercise   Blood Pressure (Admit)  116/72    Blood Pressure (Exercise)  128/82    Blood Pressure (Exit)  112/62    Heart Rate (Admit)  104 bpm    Heart Rate (Exercise)  130 bpm    Heart Rate (Exit)  109 bpm    Rating of Perceived Exertion (Exercise)  13    Symptoms  none    Comments  second full day of exercise    Duration  Continue with 30 min of aerobic exercise without signs/symptoms of physical distress.    Intensity  THRR unchanged      Progression   Progression  Continue to progress workloads to maintain intensity without signs/symptoms of physical distress.    Average METs  3.14      Resistance Training   Training Prescription  Yes    Weight  4 lb    Reps  10-15      Interval Training    Interval Training  No      Treadmill   MPH  2    Grade  2    Minutes  15    METs  3.08      Recumbant Bike   Level  5    Watts  30    Minutes  15    METs  2.94      NuStep   Level  4    Minutes  15    METs  3.4      Home Exercise Plan   Plans to continue exercise at  Home (comment) walking, treadmill    Frequency  Add 3 additional days to program exercise sessions.    Initial Home Exercises Provided  07/22/17  Nutrition:  Target Goals: Understanding of nutrition guidelines, daily intake of sodium '1500mg'$ , cholesterol '200mg'$ , calories 30% from fat and 7% or less from saturated fats, daily to have 5 or more servings of fruits and vegetables.  Biometrics: Pre Biometrics - 07/08/17 1417      Pre Biometrics   Height  6' (1.829 m)    Weight  220 lb 6.4 oz (100 kg)    Waist Circumference  41 inches    Hip Circumference  44.5 inches    Waist to Hip Ratio  0.92 %    BMI (Calculated)  29.89    Single Leg Stand  1.48 seconds        Nutrition Therapy Plan and Nutrition Goals: Nutrition Therapy & Goals - 07/08/17 1416      Intervention Plan   Intervention  Prescribe, educate and counsel regarding individualized specific dietary modifications aiming towards targeted core components such as weight, hypertension, lipid management, diabetes, heart failure and other comorbidities.;Nutrition handout(s) given to patient.    Expected Outcomes  Short Term Goal: Understand basic principles of dietary content, such as calories, fat, sodium, cholesterol and nutrients.;Short Term Goal: A plan has been developed with personal nutrition goals set during dietitian appointment.;Long Term Goal: Adherence to prescribed nutrition plan.       Nutrition Assessments: Nutrition Assessments - 07/08/17 1417      MEDFICTS Scores   Pre Score  74       Nutrition Goals Re-Evaluation:   Nutrition Goals Discharge (Final Nutrition Goals Re-Evaluation):   Psychosocial: Target Goals:  Acknowledge presence or absence of significant depression and/or stress, maximize coping skills, provide positive support system. Participant is able to verbalize types and ability to use techniques and skills needed for reducing stress and depression.   Initial Review & Psychosocial Screening: Initial Psych Review & Screening - 07/08/17 1417      Initial Review   Current issues with  Current Depression;History of Depression;Current Stress Concerns    Source of Stress Concerns  Unable to perform yard/household activities;Chronic Illness      Family Dynamics   Good Support System?  Yes brother      Barriers   Psychosocial barriers to participate in program  There are no identifiable barriers or psychosocial needs.;The patient should benefit from training in stress management and relaxation.      Screening Interventions   Interventions  Provide feedback about the scores to participant;Encouraged to exercise;Program counselor consult;To provide support and resources with identified psychosocial needs    Expected Outcomes  Short Term goal: Utilizing psychosocial counselor, staff and physician to assist with identification of specific Stressors or current issues interfering with healing process. Setting desired goal for each stressor or current issue identified.;Long Term Goal: Stressors or current issues are controlled or eliminated.;Short Term goal: Identification and review with participant of any Quality of Life or Depression concerns found by scoring the questionnaire.;Long Term goal: The participant improves quality of Life and PHQ9 Scores as seen by post scores and/or verbalization of changes       Quality of Life Scores:  Quality of Life - 07/08/17 1419      Quality of Life Scores   Health/Function Pre  20 %    Socioeconomic Pre  25.07 %    Psych/Spiritual Pre  23.21 %    Family Pre  28.3 %    GLOBAL Pre  22.93 %      Scores of 19 and below usually indicate a poorer quality of life  in these areas.  A difference of  2-3 points is a clinically meaningful difference.  A difference of 2-3 points in the total score of the Quality of Life Index has been associated with significant improvement in overall quality of life, self-image, physical symptoms, and general health in studies assessing change in quality of life.  PHQ-9: Recent Review Flowsheet Data    Depression screen [facility] 2/9 07/08/2017   Decreased Interest 2   Down, Depressed, Hopeless 3   PHQ - 2 Score 5   Altered sleeping 0   Tired, decreased energy 3   Change in appetite 3   Feeling bad or failure about yourself  2   Trouble concentrating 0   Moving slowly or fidgety/restless 0   Suicidal thoughts 0   PHQ-9 Score 13   Difficult doing work/chores Very difficult     Interpretation of Total Score  Total Score Depression Severity:  1-4 = Minimal depression, 5-9 = Mild depression, 10-14 = Moderate depression, 15-19 = Moderately severe depression, 20-27 = Severe depression   Psychosocial Evaluation and Intervention: Psychosocial Evaluation - 07/22/17 1016      Psychosocial Evaluation & Interventions   Interventions  Encouraged to exercise with the program and follow exercise prescription;Relaxation education;Stress management education    Comments  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  He is a 52 year old who has a history of heart disease with a CABG in 2015 and recent stents in the past month or so.  He lives alone but reports a strong support system with a brother and "good neighbors" locally.  He has struggled with MS for almost 25 years and has mood issues that are being addressed with medication.  Cody reports sleeping well and having a good appetite.  He has a history of depression since 1993 and reports his medications are helping with these symptoms currently.  His current stressors are his health; his mental health and finances since he has been disabled since 2003.       Expected Outcomes  Cody Burns will benefit from consistent exercise to achieve his stated goals.  The educational and psychoeducational components of this program will be helpful in understanding and coping more positively with his condition and life in general.      Continue Psychosocial Services   Follow up required by staff       Psychosocial Re-Evaluation:   Psychosocial Discharge (Final Psychosocial Re-Evaluation):   Vocational Rehabilitation: Provide vocational rehab assistance to qualifying candidates.   Vocational Rehab Evaluation & Intervention: Vocational Rehab - 07/08/17 1422      Initial Vocational Rehab Evaluation & Intervention   Assessment shows need for Vocational Rehabilitation  No       Education: Education Goals: Education classes will be provided on a variety of topics geared toward better understanding of heart health and risk factor modification. Participant will state understanding/return demonstration of topics presented as noted by education test scores.  Learning Barriers/Preferences: Learning Barriers/Preferences - 07/08/17 1420      Learning Barriers/Preferences   Learning Barriers  None    Learning Preferences  Verbal Instruction       Education Topics:  AED/CPR: - Group verbal and written instruction with the use of models to demonstrate the basic use of the AED with the basic ABC's of resuscitation.   General Nutrition Guidelines/Fats and Fiber: -Group instruction provided by verbal, written material, models and posters to present the general guidelines for heart healthy nutrition. Gives an  explanation and review of dietary fats and fiber.   Controlling Sodium/Reading Food Labels: -Group verbal and written material supporting the discussion of sodium use in heart healthy nutrition. Review and explanation with models, verbal and written materials for utilization of the food label.   Exercise Physiology & General Exercise Guidelines: -  Group verbal and written instruction with models to review the exercise physiology of the cardiovascular system and associated critical values. Provides general exercise guidelines with specific guidelines to those with heart or lung disease.    Cardiac Rehab from 07/22/2017 in [facility] Cardiac and Pulmonary Rehab  Date  07/15/17  Educator  [facility]  Instruction Review Code  1- Verbalizes Understanding      Aerobic Exercise & Resistance Training: - Gives group verbal and written instruction on the various components of exercise. Focuses on aerobic and resistive training programs and the benefits of this training and how to safely progress through these programs..   Flexibility, Balance, Mind/Body Relaxation: Provides group verbal/written instruction on the benefits of flexibility and balance training, including mind/body exercise modes such as yoga, pilates and tai chi.  Demonstration and skill practice provided.   Cardiac Rehab from 07/22/2017 in [facility] Cardiac and Pulmonary Rehab  Date  07/22/17  Educator  AS  Instruction Review Code  1- Verbalizes Understanding      Stress and Anxiety: - Provides group verbal and written instruction about the health risks of elevated stress and causes of high stress.  Discuss the correlation between heart/lung disease and anxiety and treatment options. Review healthy ways to manage with stress and anxiety.   Depression: - Provides group verbal and written instruction on the correlation between heart/lung disease and depressed mood, treatment options, and the stigmas associated with seeking treatment.   Anatomy & Physiology of the Heart: - Group verbal and written instruction and models provide basic cardiac anatomy and physiology, with the coronary electrical and arterial systems. Review of Valvular disease and Heart Failure   Cardiac Procedures: - Group verbal and written instruction to review commonly prescribed medications for heart disease. Reviews the  medication, class of the drug, and side effects. Includes the steps to properly store meds and maintain the prescription regimen. (beta blockers and nitrates)   Cardiac Medications I: - Group verbal and written instruction to review commonly prescribed medications for heart disease. Reviews the medication, class of the drug, and side effects. Includes the steps to properly store meds and maintain the prescription regimen.   Cardiac Medications II: -Group verbal and written instruction to review commonly prescribed medications for heart disease. Reviews the medication, class of the drug, and side effects. (all other drug classes)    Go Sex-Intimacy & Heart Disease, Get SMART - Goal Setting: - Group verbal and written instruction through game format to discuss heart disease and the return to sexual intimacy. Provides group verbal and written material to discuss and apply goal setting through the application of the S.M.A.R.T. Method.   Other Matters of the Heart: - Provides group verbal, written materials and models to describe Stable Angina and Peripheral Artery. Includes description of the disease process and treatment options available to the cardiac patient.   Exercise & Equipment Safety: - Individual verbal instruction and demonstration of equipment use and safety with use of the equipment.   Cardiac Rehab from 07/22/2017 in [facility] Cardiac [facility] Pulmonary Rehab  Date  07/08/17  Educator  [facility]  Instruction Review Code  1- Verbalizes Understanding      Infection Prevention: - Provides  verbal and written material to individual with discussion of infection control including proper hand washing and proper equipment cleaning during exercise session.   Cardiac Rehab from 07/22/2017 in [facility] Cardiac and Pulmonary Rehab  Date  07/08/17  Educator  [facility]  Instruction Review Code  1- Verbalizes Understanding      Falls Prevention: - Provides verbal and written material to individual with discussion  of falls prevention and safety.   Cardiac Rehab from 07/22/2017 in [facility] Cardiac and Pulmonary Rehab  Date  07/08/17  Educator  [facility]  Instruction Review Code  1- Verbalizes Understanding      Diabetes: - Individual verbal and written instruction to review signs/symptoms of diabetes, desired ranges of glucose level fasting, after meals and with exercise. Acknowledge that pre and post exercise glucose checks will be done for 3 sessions at entry of program.   Know Your Numbers and Risk Factors: -Group verbal and written instruction about important numbers in your health.  Discussion of what are risk factors and how they play a role in the disease process.  Review of Cholesterol, Blood Pressure, Diabetes, and BMI and the role they play in your overall health.   Sleep Hygiene: -Provides group verbal and written instruction about how sleep can affect your health.  Define sleep hygiene, discuss sleep cycles and impact of sleep habits. Review good sleep hygiene tips.    Other: -Provides group and verbal instruction on various topics (see comments)   Knowledge Questionnaire Score: Knowledge Questionnaire Score - 07/08/17 1421      Knowledge Questionnaire Score   Pre Score  21/28 correct answers reviewed with Cody Burns       Core Components/Risk Factors/Patient Goals at Admission: Personal Goals and Risk Factors at Admission - 07/08/17 1409      Core Components/Risk Factors/Patient Goals on Admission    Weight Management  Yes;Weight Loss    Intervention  Weight Management: Develop a combined nutrition and exercise program designed to reach desired caloric intake, while maintaining appropriate intake of nutrient and fiber, sodium and fats, and appropriate energy expenditure required for the weight goal.;Weight Management: Provide education and appropriate resources to help participant work on and attain dietary goals.;Weight Management/Obesity: Establish reasonable short term and long term weight  goals.    Admit Weight  220 lb (99.8 kg)    Goal Weight: Short Term  215 lb (97.5 kg)    Goal Weight: Long Term  210 lb (95.3 kg)    Expected Outcomes  Short Term: Continue to assess and modify interventions until short term weight is achieved;Long Term: Adherence to nutrition and physical activity/exercise program aimed toward attainment of established weight goal;Weight Maintenance: Understanding of the daily nutrition guidelines, which includes 25-35% calories from fat, 7% or less cal from saturated fats, less than '200mg'$  cholesterol, less than 1.5gm of sodium, & 5 or more servings of fruits and vegetables daily;Weight Loss: Understanding of general recommendations for a balanced deficit meal plan, which promotes 1-2 lb weight loss per week and includes a negative energy balance of [phone] kcal/d;Understanding recommendations for meals to include 15-35% energy as protein, 25-35% energy from fat, 35-60% energy from carbohydrates, less than '200mg'$  of dietary cholesterol, 20-35 gm of total fiber daily;Understanding of distribution of calorie intake throughout the day with the consumption of 4-5 meals/snacks    Hypertension  Yes    Intervention  Provide education on lifestyle modifcations including regular physical activity/exercise, weight management, moderate sodium restriction and increased consumption of fresh fruit, vegetables, and low fat  dairy, alcohol moderation, and smoking cessation.;Monitor prescription use compliance.    Expected Outcomes  Short Term: Continued assessment and intervention until BP is < 140/27m HG in hypertensive participants. < 130/82mHG in hypertensive participants with diabetes, heart failure or chronic kidney disease.;Long Term: Maintenance of blood pressure at goal levels.    Stress  Yes Chronic illness (MS) and other health issues. He is also unable to do things around the house he wished he could    Intervention  Offer individual and/or small group education and  counseling on adjustment to heart disease, stress management and health-related lifestyle change. Teach and support self-help strategies.;Refer participants experiencing significant psychosocial distress to appropriate mental health specialists for further evaluation and treatment. When possible, include family members and significant others in education/counseling sessions.    Expected Outcomes  Short Term: Participant demonstrates changes in health-related behavior, relaxation and other stress management skills, ability to obtain effective social support, and compliance with psychotropic medications if prescribed.;Long Term: Emotional wellbeing is indicated by absence of clinically significant psychosocial distress or social isolation.       Core Components/Risk Factors/Patient Goals Review:    Core Components/Risk Factors/Patient Goals at Discharge (Final Review):    ITP Comments: ITP Comments    Row Name 07/08/17 1403 07/21/17 [id number] 08/10/17 1043 08/18/17 0635   ITP Comments  Med Review completed. Initial ITP created. [facility]  30 day review completed. Continue with ITP unless diercted changes per Medical Director. New to program  ScNicki Reaperent to the ED on Monday after a fall at home that cut his leg.   Called to check on Cody after his fall.  He is feeling better and all healed up. He is hoping to return to rehab next week.   30 Day review. Continue with ITP unless directed changes per Medical Director review.   No visit in March      Comments:

## 2017-08-24 ENCOUNTER — Encounter: Payer: Medicare Other | Attending: Internal Medicine | Admitting: *Deleted

## 2017-08-24 DIAGNOSIS — Z48812 Encounter for surgical aftercare following surgery on the circulatory system: Secondary | ICD-10-CM | POA: Insufficient documentation

## 2017-08-24 DIAGNOSIS — I214 Non-ST elevation (NSTEMI) myocardial infarction: Secondary | ICD-10-CM | POA: Insufficient documentation

## 2017-08-24 DIAGNOSIS — Z955 Presence of coronary angioplasty implant and graft: Secondary | ICD-10-CM | POA: Insufficient documentation

## 2017-08-24 NOTE — Progress Notes (Signed)
Called to check on status of return of pt.  He has not attended since 07/22/17 before his fall. Left message.

## 2017-09-02 ENCOUNTER — Encounter: Payer: Self-pay | Admitting: *Deleted

## 2017-09-02 ENCOUNTER — Ambulatory Visit
Admission: EM | Admit: 2017-09-02 | Discharge: 2017-09-02 | Disposition: A | Discharge status: Home / Self Care | Payer: Medicare Other | Attending: Family Medicine | Admitting: Family Medicine

## 2017-09-02 DIAGNOSIS — W268XXA Contact with other sharp object(s), not elsewhere classified, initial encounter: Secondary | ICD-10-CM

## 2017-09-02 DIAGNOSIS — Z23 Encounter for immunization: Secondary | ICD-10-CM

## 2017-09-02 DIAGNOSIS — S91111A Laceration without foreign body of right great toe without damage to nail, initial encounter: Secondary | ICD-10-CM

## 2017-09-02 MED ORDER — MUPIROCIN 2 % EX OINT: 1.0000 "application " | TOPICAL_OINTMENT | Freq: Three times a day (TID) | CUTANEOUS | 0 refills | Status: DC

## 2017-09-02 MED ORDER — TETANUS-DIPHTH-ACELL PERTUSSIS 5-2.5-18.5 LF-MCG/0.5 IM SUSP
0.5000 mL | Freq: Once | INTRAMUSCULAR | Status: AC
  Administered 2017-09-02: 0.5 mL via INTRAMUSCULAR

## 2017-09-02 NOTE — ED Provider Notes (Signed)
[facility]    CSN: [id number] Arrival date & time: 09/02/17  4098     History   Chief Complaint Chief Complaint  Patient presents with  . Laceration    HPI Cody Burns is a 52 y.o. male.   HPI  52 year old male presents with a laceration over the dorsum of his right big toe.  He states that today he turned and scraped his toe along the edge of a cabinet causing the laceration.  Has multiple comorbidities including carotid stenosis, ischemic stroke, multiple sclerosis and non-STEMI.  UnderWent repair of a laceration on his right lower extremity in March.  Does not think he is current on his tetanus toxoid.        Past Medical History:  Diagnosis Date  . 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 cause of his ankle break. awaiting neuro workup  . Hypertension   . Hypothyroidism   . Multiple sclerosis (HCC) 1990  . Myocardial infarct (HCC)   . Optic neuritis    x 3. takes steriods and it resolves.  . Seizures (HCC)    as a child. diagnosed with MS and once treated, seizures resolved.  . Sleep apnea     Patient Active Problem List   Diagnosis Date Noted  . Acute renal failure (ARF) (HCC) 06/05/2017  . Acute kidney injury (HCC) 02/23/2017  . Carotid stenosis, asymptomatic, right 11/16/2016  . 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 Hx of ischemic left MCA stroke 11/13/2016  . Hypertension, essential 11/13/2016  . Multiple sclerosis, relapsing-remitting (HCC) 11/13/2016  . Closed trimalleolar fracture of right ankle 09/25/2016  . Closed right ankle fracture 09/22/2016    Past Surgical History:  Procedure Laterality Date  . CORONARY ARTERY BYPASS GRAFT  2015  . CORONARY STENT INTERVENTION N/A 06/07/2017   Procedure: CORONARY STENT INTERVENTION;  Surgeon: Alwyn Pea, MD;  Location: [facility] INVASIVE CV LAB;  Service: Cardiovascular;  Laterality: N/A;  . JOINT REPLACEMENT Left 2007   left hip replacement  . LEFT HEART CATH  AND CORS/GRAFTS ANGIOGRAPHY N/A 06/07/2017   Procedure: LEFT HEART CATH AND CORS/GRAFTS ANGIOGRAPHY;  Surgeon: Lamar Blinks, MD;  Location: [facility] INVASIVE CV LAB;  Service: Cardiovascular;  Laterality: N/A;  . ORIF ANKLE FRACTURE Right 09/22/2016   Procedure: OPEN REDUCTION INTERNAL FIXATION (ORIF) ANKLE FRACTURE;  Surgeon: Christena Flake, MD;  Location: [facility] ORS;  Service: Orthopedics;  Laterality: Right;  . quadruple cardiac bypass  2015       Home Medications    Prior to Admission medications   Medication Sig Start Date End Date Taking? Authorizing Provider  amLODipine (NORVASC) 5 MG tablet Take by mouth.    [provider]  aspirin EC 81 MG tablet Take 81 mg by mouth daily.    [provider]  atorvastatin (LIPITOR) 80 MG tablet Take 80 mg by mouth at bedtime.    [provider]  buPROPion (WELLBUTRIN XL) 300 MG 24 hr tablet Take 300 mg by mouth daily.    [provider]  calcium-vitamin D (OSCAL WITH D) 500-200 MG-UNIT tablet Take 1 tablet by mouth daily.    [provider]  clindamycin (CLEOCIN) 150 MG capsule Take by mouth.    [provider]  clopidogrel (PLAVIX) 75 MG tablet Take 75 mg by mouth daily. 11/25/16 11/25/17  [provider]  cyanocobalamin 1000 MCG tablet Take 1,000 mcg by mouth daily.    [provider]  cyclobenzaprine (FLEXERIL) 10 MG tablet  Take 1 tablet (10 mg total) by mouth 3 (three) times daily. 06/11/17   Shaune Pollack, MD  Dimethyl Fumarate (TECFIDERA) 240 MG CPDR Take 240 mg by mouth 2 (two) times daily.    [provider]  escitalopram (LEXAPRO) 20 MG tablet Take 20 mg by mouth daily.    [provider]  folic acid (FOLVITE) 1 MG tablet Take 1 mg by mouth daily. 11/25/16 11/25/17  [provider]  gabapentin (NEURONTIN) 300 MG capsule Take 900 mg by mouth 3 (three) times daily.     [provider]  levothyroxine (SYNTHROID, LEVOTHROID) 25 MCG tablet Take 25 mcg by  mouth daily before breakfast.     [provider]  lisinopril (PRINIVIL,ZESTRIL) 20 MG tablet Take by mouth.    [provider]  LORazepam (ATIVAN) 0.5 MG tablet Take by mouth.    [provider]  montelukast (SINGULAIR) 10 MG tablet Take 10 mg by mouth daily.     [provider]  mupirocin ointment (BACTROBAN) 2 % Apply 1 application topically 3 (three) times daily. 09/02/17   Lutricia Feil, PA-C  niacin (NIASPAN) 500 MG CR tablet Take 500 mg by mouth at bedtime. 11/24/16 11/24/17  [provider]  QUEtiapine (SEROQUEL) 100 MG tablet Take 100 mg by mouth at bedtime.    [provider]  sennosides-docusate sodium (SENOKOT-S) 8.6-50 MG tablet Take 2 tablets by mouth 2 (two) times daily.    [provider]  traMADol (ULTRAM) 50 MG tablet Take 1 tablet (50 mg total) by mouth 2 (two) times daily as needed for moderate pain or severe pain. 06/11/17   Shaune Pollack, MD    Family History Family History  Problem Relation Age of Onset  . Cancer Mother   . Other Father     Social History Social History   Tobacco Use  . Smoking status: Never Smoker  . Smokeless tobacco: Current User    Types: Snuff  . Tobacco comment: rarely uses smokeless tobacco  Substance Use Topics  . Alcohol use: Yes    Comment: maybe a 6 pack per week  . Drug use: No     Allergies   Hydrochlorothiazide w-triamterene; Metoprolol; and Penicillins   Review of Systems Review of Systems  Constitutional: Positive for activity change. Negative for chills, fatigue and fever.  Skin: Positive for wound.  All other systems reviewed and are negative.    Physical Exam Triage Vital Signs ED Triage Vitals  Enc Vitals Group     BP 09/02/17 0955 (!) 159/94     Pulse Rate 09/02/17 0955 (!) 107     Resp 09/02/17 0955 16     Temp 09/02/17 0955 (!) 97.5 F (36.4 C)     Temp Source 09/02/17 0955 Oral     SpO2 09/02/17 0955 100 %     Weight 09/02/17 0958 235 lb  (106.6 kg)     Height 09/02/17 0958 6' (1.829 m)     Head Circumference --      Peak Flow --      Pain Score 09/02/17 0957 0     Pain Loc --      Pain Edu? --      Excl. in GC? --    No data found.  Updated Vital Signs BP (!) 159/94 (BP Location: Left Arm)   Pulse (!) 107   Temp (!) 97.5 F (36.4 C) (Oral)   Resp 16   Ht 6' (1.829 m)   [employer]  235 lb (106.6 kg)   SpO2 100%   BMI 31.87 kg/m   Visual Acuity Right Eye Distance:   Left Eye Distance:   Bilateral Distance:    Right Eye Near:   Left Eye Near:    Bilateral Near:     Physical Exam  Constitutional: He is oriented to person, place, and time. He appears well-developed and well-nourished. No distress.  HENT:  Head: Normocephalic.  Eyes: Pupils are equal, round, and reactive to light. Right eye exhibits no discharge. Left eye exhibits no discharge.  Neck: Normal range of motion.  Musculoskeletal: Normal range of motion.  Examination of the right great toe shows a transverse laceration over lying the IP joint and extending 2.9 cm.  Extends into the subcutaneous tissue but does not involve the extensor tendon  Or the nail.  Neurological: He is alert and oriented to person, place, and time.  Skin: Skin is warm and dry. He is not diaphoretic. There is erythema.  Psychiatric: He has a normal mood and affect. His behavior is normal. Judgment and thought content normal.  Nursing note and vitals reviewed.    UC Treatments / Results  Labs (all labs ordered are listed, but only abnormal results are displayed) Labs Reviewed - No data to display  EKG None Radiology No results found.  Procedures Laceration Repair Date/Time: 09/02/2017 10:59 AM Performed by: Lutricia Feil, PA-C Authorized by: Tommie Sams, DO   Consent:    Consent obtained:  Verbal   Consent given by:  Patient   Risks discussed:  Infection, pain and poor wound healing Anesthesia (see MAR for exact dosages):    Anesthesia method:  Local  infiltration   Local anesthetic:  Lidocaine 1% w/o epi Laceration details:    Location:  Toe   Toe location:  R big toe   Length (cm):  2.9   Depth (mm):  2 Repair type:    Repair type:  Simple Pre-procedure details:    Preparation:  Patient was prepped and draped in usual sterile fashion Exploration:    Hemostasis achieved with:  Direct pressure   Wound exploration: wound explored through full range of motion and entire depth of wound probed and visualized     Contaminated: no   Treatment:    Area cleansed with:  Betadine   Amount of cleaning:  Extensive   Irrigation solution:  Sterile saline   Irrigation volume:  90   Irrigation method:  Pressure wash   Visualized foreign bodies/material removed: no   Skin repair:    Repair method:  Sutures   Suture size:  5-0   Suture material:  Nylon   Suture technique:  Simple interrupted   Number of sutures:  6 Approximation:    Approximation:  Close Post-procedure details:    Dressing:  Sterile dressing   Patient tolerance of procedure:  Tolerated well, no immediate complications Comments:     Return to clinic in 2 days for wound check and plan suture removal in 10 days   (including critical care time)  Medications Ordered in UC Medications  Tdap (BOOSTRIX) injection 0.5 mL (0.5 mLs Intramuscular Given 09/02/17 1012)     Initial Impression / Assessment and Plan / UC Course  I have reviewed the triage vital signs and the nursing notes.  Pertinent labs & imaging results that were available during my care of the patient were reviewed by me and considered in my medical decision making (see chart for details).  Plan: 1. Test/x-ray results and diagnosis reviewed with patient 2. rx as per orders; risks, benefits, potential side effects reviewed with patient 3. Recommend supportive treatment with elevation and ice as necessary for swelling or pain.  Use Bactroban ointment 3 times daily until sutures are removed.  If any signs or  symptoms of infection develop return to our clinic immediately.  It was discussed with the patient.  Plan on suture removal in 10 days.  Recommend he return to our clinic in 2 days for wound recheck 4. F/u prn if symptoms worsen or don't improve   Final Clinical Impressions(s) / UC Diagnoses   Final diagnoses:  Laceration of right great toe without foreign body present or damage to nail, initial encounter    ED Discharge Orders        Ordered    mupirocin ointment (BACTROBAN) 2 %  3 times daily     09/02/17 1043       Controlled Substance Prescriptions [place] Controlled Substance Registry consulted? Not Applicable   Lutricia Feil, PA-C 09/02/17 1106

## 2017-09-02 NOTE — Discharge Instructions (Signed)
Use Bactroban ointment 3 times daily until sutures are removed.  If any signs or symptoms of infection are present return to our clinic immediately.  Elevate as necessary for swelling or pain.

## 2017-09-02 NOTE — ED Triage Notes (Signed)
1 1/2" laceration to right big toe, done on a cabinet corner, bleeding controlled.

## 2017-09-07 ENCOUNTER — Other Ambulatory Visit: Payer: Self-pay

## 2017-09-07 ENCOUNTER — Ambulatory Visit
Admission: EM | Admit: 2017-09-07 | Discharge: 2017-09-07 | Disposition: A | Discharge status: Home / Self Care | Payer: Medicare Other | Attending: Family Medicine | Admitting: Family Medicine

## 2017-09-07 ENCOUNTER — Encounter: Payer: Self-pay | Admitting: Emergency Medicine

## 2017-09-07 DIAGNOSIS — J069 Acute upper respiratory infection, unspecified: Secondary | ICD-10-CM

## 2017-09-07 DIAGNOSIS — R059 Cough, unspecified: Secondary | ICD-10-CM

## 2017-09-07 DIAGNOSIS — R05 Cough: Secondary | ICD-10-CM

## 2017-09-07 DIAGNOSIS — H6691 Otitis media, unspecified, right ear: Secondary | ICD-10-CM

## 2017-09-07 MED ORDER — BENZONATATE 100 MG PO CAPS: 100.0000 mg | ORAL_CAPSULE | Freq: Three times a day (TID) | ORAL | 0 refills | Status: DC | PRN

## 2017-09-07 MED ORDER — DOXYCYCLINE HYCLATE 100 MG PO CAPS: 100.0000 mg | ORAL_CAPSULE | Freq: Two times a day (BID) | ORAL | 0 refills | Status: DC

## 2017-09-07 NOTE — ED Provider Notes (Signed)
[facility] ____________________________________________  Time seen: Approximately 1:59 PM  I have reviewed the triage vital signs and the nursing notes.   HISTORY  Chief Complaint Cough   HPI Cody Burns is a 52 y.o. male presented for evaluation of 4 days of runny nose, nasal congestion, cough.  States occasional throat irritation, states throat irritation is only from coughing.  Denies pain with swallowing.  States having a lot of nasal drainage.  Reports for the last 2 days he has been having right ear discomfort that has persisted.  States prior to current symptoms he was having some nasal congestion more consistent with his normal seasonal allergies.  Reports did try DayQuil earlier without any change.  States cough is often a dry hacking cough.  Denies fevers.  Had occasional chills but does not think he had a fever.  Continues to eat and drink well.  Denies urinary or bowel changes. Denies chest pain, shortness of breath, abdominal pain, dysuria, extremity pain, extremity swelling or rash. Denies recent sickness. Denies recent antibiotic use.  Denies other aggravating or alleviating factors.  [facility]: PCP   Past Medical History:  Diagnosis Date  . 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 cause of his ankle break. awaiting neuro workup  . Hypertension   . Hypothyroidism   . Multiple sclerosis (HCC) 1990  . Myocardial infarct (HCC)   . Optic neuritis    x 3. takes steriods and it resolves.  . Seizures (HCC)    as a child. diagnosed with MS and once treated, seizures resolved.  . Sleep apnea     Patient Active Problem List   Diagnosis Date Noted  . Acute renal failure (ARF) (HCC) 06/05/2017  . Acute kidney injury (HCC) 02/23/2017  . Carotid stenosis, asymptomatic, right 11/16/2016  . 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 Hx of ischemic left MCA stroke 11/13/2016  . Hypertension, essential 11/13/2016  .  Multiple sclerosis, relapsing-remitting (HCC) 11/13/2016  . Closed trimalleolar fracture of right ankle 09/25/2016  . Closed right ankle fracture 09/22/2016    Past Surgical History:  Procedure Laterality Date  . CORONARY ARTERY BYPASS GRAFT  2015  . CORONARY STENT INTERVENTION N/A 06/07/2017   Procedure: CORONARY STENT INTERVENTION;  Surgeon: Alwyn Pea, MD;  Location: [facility] INVASIVE CV LAB;  Service: Cardiovascular;  Laterality: N/A;  . JOINT REPLACEMENT Left 2007   left hip replacement  . LEFT HEART CATH AND CORS/GRAFTS ANGIOGRAPHY N/A 06/07/2017   Procedure: LEFT HEART CATH AND CORS/GRAFTS ANGIOGRAPHY;  Surgeon: Lamar Blinks, MD;  Location: [facility] INVASIVE CV LAB;  Service: Cardiovascular;  Laterality: N/A;  . ORIF ANKLE FRACTURE Right 09/22/2016   Procedure: OPEN REDUCTION INTERNAL FIXATION (ORIF) ANKLE FRACTURE;  Surgeon: Christena Flake, MD;  Location: [facility] ORS;  Service: Orthopedics;  Laterality: Right;  . quadruple cardiac bypass  2015     No current facility-administered medications for this encounter.   Current Outpatient Medications:  .  amLODipine (NORVASC) 5 MG tablet, Take by mouth., Disp: , Rfl:  .  aspirin EC 81 MG tablet, Take 81 mg by mouth daily., Disp: , Rfl:  .  atorvastatin (LIPITOR) 80 MG tablet, Take 80 mg by mouth at bedtime., Disp: , Rfl:  .  buPROPion (WELLBUTRIN XL) 300 MG 24 hr tablet, Take 300 mg by mouth daily., Disp: , Rfl:  .  calcium-vitamin D (OSCAL WITH D) 500-200 MG-UNIT tablet, Take 1 tablet by mouth daily., Disp: , Rfl:  .  clopidogrel (PLAVIX) 75 MG tablet, Take 75 mg by mouth daily., Disp: , Rfl:  .  cyanocobalamin 1000 MCG tablet, Take 1,000 mcg by mouth daily., Disp: , Rfl:  .  cyclobenzaprine (FLEXERIL) 10 MG tablet, Take 1 tablet (10 mg total) by mouth 3 (three) times daily., Disp: 30 tablet, Rfl: 0 .  Dimethyl Fumarate (TECFIDERA) 240 MG CPDR, Take 240 mg by mouth 2 (two) times daily., Disp: , Rfl:  .  escitalopram (LEXAPRO) 20 MG  tablet, Take 20 mg by mouth daily., Disp: , Rfl:  .  folic acid (FOLVITE) 1 MG tablet, Take 1 mg by mouth daily., Disp: , Rfl:  .  gabapentin (NEURONTIN) 300 MG capsule, Take 900 mg by mouth 3 (three) times daily. , Disp: , Rfl:  .  levothyroxine (SYNTHROID, LEVOTHROID) 25 MCG tablet, Take 25 mcg by mouth daily before breakfast. , Disp: , Rfl:  .  lisinopril (PRINIVIL,ZESTRIL) 20 MG tablet, Take by mouth., Disp: , Rfl:  .  LORazepam (ATIVAN) 0.5 MG tablet, Take by mouth., Disp: , Rfl:  .  montelukast (SINGULAIR) 10 MG tablet, Take 10 mg by mouth daily. , Disp: , Rfl:  .  mupirocin ointment (BACTROBAN) 2 %, Apply 1 application topically 3 (three) times daily., Disp: 22 g, Rfl: 0 .  niacin (NIASPAN) 500 MG CR tablet, Take 500 mg by mouth at bedtime., Disp: , Rfl:  .  QUEtiapine (SEROQUEL) 100 MG tablet, Take 100 mg by mouth at bedtime., Disp: , Rfl:  .  sennosides-docusate sodium (SENOKOT-S) 8.6-50 MG tablet, Take 2 tablets by mouth 2 (two) times daily., Disp: , Rfl:  .  traMADol (ULTRAM) 50 MG tablet, Take 1 tablet (50 mg total) by mouth 2 (two) times daily as needed for moderate pain or severe pain., Disp: 12 tablet, Rfl: 0 .  benzonatate (TESSALON PERLES) 100 MG capsule, Take 1 capsule (100 mg total) by mouth 3 (three) times daily as needed for cough., Disp: 15 capsule, Rfl: 0 .  clindamycin (CLEOCIN) 150 MG capsule, Take by mouth., Disp: , Rfl:  .  doxycycline (VIBRAMYCIN) 100 MG capsule, Take 1 capsule (100 mg total) by mouth 2 (two) times daily., Disp: 20 capsule, Rfl: 0  Allergies Hydrochlorothiazide w-triamterene; Metoprolol; and Penicillins  Family History  Problem Relation Age of Onset  . Cancer Mother   . Other Father     Social History Social History   Tobacco Use  . Smoking status: Never Smoker  . Smokeless tobacco: Current User    Types: Snuff  . Tobacco comment: rarely uses smokeless tobacco  Substance Use Topics  . Alcohol use: Yes    Comment: maybe a 6 pack per week    . Drug use: No    Review of Systems Constitutional: As above Eyes: No visual changes. ENT: As above Cardiovascular: Denies chest pain. Respiratory: Denies shortness of breath. Gastrointestinal: No abdominal pain.   Genitourinary: Negative for dysuria. Musculoskeletal: Negative for back pain. Skin: Negative for rash.   ____________________________________________   PHYSICAL EXAM:  VITAL SIGNS: ED Triage Vitals  Enc Vitals Group     BP 09/07/17 1258 (S) (!) 161/103     Pulse Rate 09/07/17 1258 (!) 108 Recheck 90     Resp 09/07/17 1258 16     Temp 09/07/17 1258 98.8 F (37.1 C)     Temp Source 09/07/17 1258 Oral     SpO2 09/07/17 1258 100 %     Weight 09/07/17 1256 232 lb (105.2 kg)  Height 09/07/17 1256 6' (1.829 m)     Head Circumference --      Peak Flow --      Pain Score 09/07/17 1255 0     Pain Loc --      Pain Edu? --      Excl. in GC? --     Constitutional: Alert and oriented. Well appearing and in no acute distress. Eyes: Conjunctivae are normal.  Head: Atraumatic.Mild tenderness to palpation bilateral frontal and maxillary sinuses. No swelling. No erythema.   Ears: Right: Nontender, normal canal, moderate erythema and dull TM.  Left: Nontender, normal canal, no erythema, normal TM.  Nose: nasal congestion with bilateral nasal turbinate erythema and edema.   Mouth/Throat: Mucous membranes are moist.  Oropharynx non-erythematous.No tonsillar swelling or exudate.  Neck: No stridor.  No cervical spine tenderness to palpation. Hematological/Lymphatic/Immunilogical: No cervical lymphadenopathy. Cardiovascular: Normal rate, regular rhythm. Grossly normal heart sounds.  Good peripheral circulation. Respiratory: Normal respiratory effort.  No retractions.No wheezes, rales or rhonchi. Good air movement.  Dry intermittent cough noted in room. Musculoskeletal:  No cervical, thoracic or lumbar tenderness to palpation.  Neurologic:  Normal speech and language.   Skin:  Skin is warm, dry and intact. No rash noted. Psychiatric: Mood and affect are normal. Speech and behavior are normal.  ___________________________________________   LABS (all labs ordered are listed, but only abnormal results are displayed)  Labs Reviewed - No data to display ____________________________________________   PROCEDURES Procedures   Well-appearing patient.  No acute distress.  INITIAL IMPRESSION / ASSESSMENT AND PLAN / ED COURSE  Pertinent labs & imaging results that were available during my care of the patient were reviewed by me and considered in my medical decision making (see chart for details).  Suspect recent allergic rhinitis symptoms with acute onset of viral upper respiratory infection this past weekend.  Patient continues with cough and does have noted mild right otitis.  Patient reports penicillin allergic with tongue swelling, and unsure if ever tolerated cephalosporins.  Patient also with extensive comorbidities of cardiac history and neurological.  Discussed due to medical history and allergies, will treat with oral doxycycline and PRN Tessalon Perles.  Encourage rest, fluids, supportive care.  Discussed strict follow-up and return parameters.  Continue home over-the-counter antihistamine.Discussed indication, risks and benefits of medications with patient.  Discussed follow up with Primary care physician this week. Discussed follow up and return parameters including no resolution or any worsening concerns. Patient verbalized understanding and agreed to plan.   ____________________________________________   FINAL CLINICAL IMPRESSION(S) / ED DIAGNOSES  Final diagnoses:  Right otitis media, unspecified otitis media type  Cough  Upper respiratory tract infection, unspecified type     ED Discharge Orders        Ordered    doxycycline (VIBRAMYCIN) 100 MG capsule  2 times daily     09/07/17 1403    benzonatate (TESSALON PERLES) 100 MG capsule  3  times daily PRN     09/07/17 1403       Note: This dictation was prepared with Dragon dictation along with smaller phrase technology. Any transcriptional errors that result from this process are unintentional.         Renford Dills, NP 09/07/17 1836

## 2017-09-07 NOTE — Discharge Instructions (Addendum)
Take medication as prescribed. Rest. Drink plenty of fluids.  ° °Follow up with your primary care physician this week as needed. Return to Urgent care for new or worsening concerns.  ° °

## 2017-09-07 NOTE — ED Triage Notes (Signed)
Patient c/o cough and chest congestion since last Friday.   

## 2017-09-09 ENCOUNTER — Encounter: Payer: Self-pay | Admitting: *Deleted

## 2017-09-09 DIAGNOSIS — I214 Non-ST elevation (NSTEMI) myocardial infarction: Secondary | ICD-10-CM

## 2017-09-09 DIAGNOSIS — Z955 Presence of coronary angioplasty implant and graft: Secondary | ICD-10-CM

## 2017-09-09 NOTE — Progress Notes (Signed)
Discharge Progress Report  Patient Details  Name: Cody Burns MRN: [id number] Date of Birth: September 05, 1965 Referring Provider:     Cardiac Rehab from 07/08/2017 in [facility]  Referring Provider  [facility]       Number of Visits: 5/36  Reason for Discharge:  Early Exit:  Lack of attendance  Smoking History:  Social History   Tobacco Use  Smoking Status Never Smoker  Smokeless Tobacco Current User  . Types: Snuff  Tobacco Comment   rarely uses smokeless tobacco    Diagnosis:  NSTEMI (non-ST elevated myocardial infarction) (HCC)  Status post coronary artery stent placement  ADL [facility]:   Initial Exercise Prescription: Initial Exercise Prescription - 07/08/17 1400      Date of Initial Exercise RX and Referring Provider   Date  07/08/17    Referring Provider  [place]      Treadmill   MPH  2    Grade  2    Minutes  15    METs  3      Recumbant Bike   Level  3    RPM  60    Watts  50    Minutes  15    METs  3.5      NuStep   Level  4    SPM  80    Minutes  15    METs  3.5      Prescription Details   Frequency (times per week)  3    Duration  Progress to 30 minutes of continuous aerobic without signs/symptoms of physical distress      Intensity   THRR 40-80% of Max Heartrate  125-153    Ratings of Perceived Exertion  11-13    Perceived Dyspnea  0-4      Resistance Training   Training Prescription  Yes    Weight  4 lb    Reps  10-15       Discharge Exercise Prescription (Final Exercise Prescription Changes): Exercise Prescription Changes - 07/28/17 0800      Response to Exercise   Blood Pressure (Admit)  116/72    Blood Pressure (Exercise)  128/82    Blood Pressure (Exit)  112/62    Heart Rate (Admit)  104 bpm    Heart Rate (Exercise)  130 bpm    Heart Rate (Exit)  109 bpm    Rating of Perceived Exertion (Exercise)  13    Symptoms  none    Comments  second full day of exercise    Duration  Continue with 30 min of  aerobic exercise without signs/symptoms of physical distress.    Intensity  THRR unchanged      Progression   Progression  Continue to progress workloads to maintain intensity without signs/symptoms of physical distress.    Average METs  3.14      Resistance Training   Training Prescription  Yes    Weight  4 lb    Reps  10-15      Interval Training   Interval Training  No      Treadmill   MPH  2    Grade  2    Minutes  15    METs  3.08      Recumbant Bike   Level  5    Watts  30    Minutes  15    METs  2.94      NuStep   Level  4  Minutes  15    METs  3.4      Home Exercise Plan   Plans to continue exercise at  Home (comment) walking, treadmill    Frequency  Add 3 additional days to program exercise sessions.    Initial Home Exercises Provided  07/22/17       Functional Capacity: 6 Minute Walk    Row Name 07/08/17 1419         6 Minute Walk   Distance  1195 feet     Walk Time  6 minutes     # of Rest Breaks  0     MPH  2.26     METS  3.85     RPE  13     Perceived Dyspnea   0     Symptoms  Yes (comment)     Comments  L hip pain 8/10      Resting HR  98 bpm     Resting BP  130/80     Resting Oxygen Saturation   98 %     Exercise Oxygen Saturation  during 6 min walk  100 %     Max Ex. HR  114 bpm     Max Ex. BP  148/84     2 Minute Post BP  134/82        Psychological, QOL, Others - Outcomes: PHQ 2/9: Depression screen PHQ 2/9 07/08/2017  Decreased Interest 2  Down, Depressed, Hopeless 3  PHQ - 2 Score 5  Altered sleeping 0  Tired, decreased energy 3  Change in appetite 3  Feeling bad or failure about yourself  2  Trouble concentrating 0  Moving slowly or fidgety/restless 0  Suicidal thoughts 0  PHQ-9 Score 13  Difficult doing work/chores Very difficult    Quality of Life: Quality of Life - 07/08/17 1419      Quality of Life Scores   Health/Function Pre  20 %    Socioeconomic Pre  25.07 %    Psych/Spiritual Pre  23.21 %    Family  Pre  28.3 %    GLOBAL Pre  22.93 %       Personal Goals: Goals established at orientation with interventions provided to work toward goal. Personal Goals and Risk Factors at Admission - 07/08/17 1409      Core Components/Risk Factors/Patient Goals on Admission    Weight Management  Yes;Weight Loss    Intervention  Weight Management: Develop a combined nutrition and exercise program designed to reach desired caloric intake, while maintaining appropriate intake of nutrient and fiber, sodium and fats, and appropriate energy expenditure required for the weight goal.;Weight Management: Provide education and appropriate resources to help participant work on and attain dietary goals.;Weight Management/Obesity: Establish reasonable short term and long term weight goals.    Admit Weight  220 lb (99.8 kg)    Goal Weight: Short Term  215 lb (97.5 kg)    Goal Weight: Long Term  210 lb (95.3 kg)    Expected Outcomes  Short Term: Continue to assess and modify interventions until short term weight is achieved;Long Term: Adherence to nutrition and physical activity/exercise program aimed toward attainment of established weight goal;Weight Maintenance: Understanding of the daily nutrition guidelines, which includes 25-35% calories from fat, 7% or less cal from saturated fats, less than 200mg  cholesterol, less than 1.5gm of sodium, & 5 or more servings of fruits and vegetables daily;Weight Loss: Understanding of general recommendations for a balanced deficit meal plan,  which promotes 1-2 lb weight loss per week and includes a negative energy balance of [phone] kcal/d;Understanding recommendations for meals to include 15-35% energy as protein, 25-35% energy from fat, 35-60% energy from carbohydrates, less than 200mg  of dietary cholesterol, 20-35 gm of total fiber daily;Understanding of distribution of calorie intake throughout the day with the consumption of 4-5 meals/snacks    Hypertension  Yes    Intervention   Provide education on lifestyle modifcations including regular physical activity/exercise, weight management, moderate sodium restriction and increased consumption of fresh fruit, vegetables, and low fat dairy, alcohol moderation, and smoking cessation.;Monitor prescription use compliance.    Expected Outcomes  Short Term: Continued assessment and intervention until BP is < 140/26mm HG in hypertensive participants. < 130/32mm HG in hypertensive participants with diabetes, heart failure or chronic kidney disease.;Long Term: Maintenance of blood pressure at goal levels.    Stress  Yes Chronic illness (MS) and other health issues. He is also unable to do things around the house he wished he could    Intervention  Offer individual and/or small group education and counseling on adjustment to heart disease, stress management and health-related lifestyle change. Teach and support self-help strategies.;Refer participants experiencing significant psychosocial distress to appropriate mental health specialists for further evaluation and treatment. When possible, include family members and significant others in education/counseling sessions.    Expected Outcomes  Short Term: Participant demonstrates changes in health-related behavior, relaxation and other stress management skills, ability to obtain effective social support, and compliance with psychotropic medications if prescribed.;Long Term: Emotional wellbeing is indicated by absence of clinically significant psychosocial distress or social isolation.        Personal Goals Discharge:   Exercise Goals and Review: Exercise Goals    Row Name 07/08/17 1418             Exercise Goals   Increase Physical Activity  Yes       Intervention  Provide advice, education, support and counseling about physical activity/exercise needs.;Develop an individualized exercise prescription for aerobic and resistive training based on initial evaluation findings, risk  stratification, comorbidities and participant's personal goals.       Expected Outcomes  Short Term: Attend rehab on a regular basis to increase amount of physical activity.;Long Term: Add in home exercise to make exercise part of routine and to increase amount of physical activity.;Long Term: Exercising regularly at least 3-5 days a week.       Increase Strength and Stamina  Yes       Intervention  Provide advice, education, support and counseling about physical activity/exercise needs.;Develop an individualized exercise prescription for aerobic and resistive training based on initial evaluation findings, risk stratification, comorbidities and participant's personal goals.       Expected Outcomes  Short Term: Increase workloads from initial exercise prescription for resistance, speed, and METs.;Short Term: Perform resistance training exercises routinely during rehab and add in resistance training at home;Long Term: Improve cardiorespiratory fitness, muscular endurance and strength as measured by increased METs and functional capacity ( )       Able to understand and use rate of perceived exertion (RPE) scale  Yes       Intervention  Provide education and explanation on how to use RPE scale       Expected Outcomes  Short Term: Able to use RPE daily in rehab to express subjective intensity level;Long Term:  Able to use RPE to guide intensity level when exercising independently       Able to  understand and use Dyspnea scale  Yes       Intervention  Provide education and explanation on how to use Dyspnea scale       Expected Outcomes  Short Term: Able to use Dyspnea scale daily in rehab to express subjective sense of shortness of breath during exertion;Long Term: Able to use Dyspnea scale to guide intensity level when exercising independently       Knowledge and understanding of Target Heart Rate Range (THRR)  Yes       Intervention  Provide education and explanation of THRR including how the numbers  were predicted and where they are located for reference       Expected Outcomes  Short Term: Able to state/look up THRR;Long Term: Able to use THRR to govern intensity when exercising independently;Short Term: Able to use daily as guideline for intensity in rehab       Able to check pulse independently  Yes       Intervention  Provide education and demonstration on how to check pulse in carotid and radial arteries.;Review the importance of being able to check your own pulse for safety during independent exercise       Expected Outcomes  Short Term: Able to explain why pulse checking is important during independent exercise;Long Term: Able to check pulse independently and accurately       Understanding of Exercise Prescription  Yes          Nutrition & Weight - Outcomes: Pre Biometrics - 07/08/17 1417      Pre Biometrics   Height  6' (1.829 m)    Weight  220 lb 6.4 oz (100 kg)    Waist Circumference  41 inches    Hip Circumference  44.5 inches    Waist to Hip Ratio  0.92 %    BMI (Calculated)  29.89    Single Leg Stand  1.48 seconds        Nutrition: Nutrition Therapy & Goals - 07/08/17 1416      Intervention Plan   Intervention  Prescribe, educate and counsel regarding individualized specific dietary modifications aiming towards targeted core components such as weight, hypertension, lipid management, diabetes, heart failure and other comorbidities.;Nutrition handout(s) given to patient.    Expected Outcomes  Short Term Goal: Understand basic principles of dietary content, such as calories, fat, sodium, cholesterol and nutrients.;Short Term Goal: A plan has been developed with personal nutrition goals set during dietitian appointment.;Long Term Goal: Adherence to prescribed nutrition plan.       Nutrition Discharge: Nutrition Assessments - 07/08/17 1417      MEDFICTS Scores   Pre Score  74       Education Questionnaire Score: Knowledge Questionnaire Score - 07/08/17 1421       Knowledge Questionnaire Score   Pre Score  21/28 correct answers reviewed with Molly Maduro       Goals reviewed with patient; copy given to patient.

## 2017-09-09 NOTE — Progress Notes (Signed)
Cardiac Individual Treatment Plan  Patient Details  Name: Cody Burns MRN: [id number] Date of Birth: 1966-05-16 Referring Provider:     Cardiac Rehab from 07/08/2017 in [facility] Cardiac and Pulmonary Rehab  Referring Provider  [facility]      Initial Encounter Date:    Cardiac Rehab from 07/08/2017 in [facility] Cardiac and Pulmonary Rehab  Date  07/08/17  Referring Provider  [facility]      Visit Diagnosis: NSTEMI (non-ST elevated myocardial infarction) [facility])  Status post coronary artery stent placement  Patient's Home Medications on Admission:  Current Outpatient Medications:  .  amLODipine (NORVASC) 5 MG tablet, Take by mouth., Disp: , Rfl:  .  aspirin EC 81 MG tablet, Take 81 mg by mouth daily., Disp: , Rfl:  .  atorvastatin (LIPITOR) 80 MG tablet, Take 80 mg by mouth at bedtime., Disp: , Rfl:  .  benzonatate (TESSALON PERLES) 100 MG capsule, Take 1 capsule (100 mg total) by mouth 3 (three) times daily as needed for cough., Disp: 15 capsule, Rfl: 0 .  buPROPion (WELLBUTRIN XL) 300 MG 24 hr tablet, Take 300 mg by mouth daily., Disp: , Rfl:  .  calcium-vitamin D (OSCAL WITH D) 500-200 MG-UNIT tablet, Take 1 tablet by mouth daily., Disp: , Rfl:  .  clindamycin (CLEOCIN) 150 MG capsule, Take by mouth., Disp: , Rfl:  .  clopidogrel (PLAVIX) 75 MG tablet, Take 75 mg by mouth daily., Disp: , Rfl:  .  cyanocobalamin 1000 MCG tablet, Take 1,000 mcg by mouth daily., Disp: , Rfl:  .  cyclobenzaprine (FLEXERIL) 10 MG tablet, Take 1 tablet (10 mg total) by mouth 3 (three) times daily., Disp: 30 tablet, Rfl: 0 .  Dimethyl Fumarate (TECFIDERA) 240 MG CPDR, Take 240 mg by mouth 2 (two) times daily., Disp: , Rfl:  .  doxycycline (VIBRAMYCIN) 100 MG capsule, Take 1 capsule (100 mg total) by mouth 2 (two) times daily., Disp: 20 capsule, Rfl: 0 .  escitalopram (LEXAPRO) 20 MG tablet, Take 20 mg by mouth daily., Disp: , Rfl:  .  folic acid (FOLVITE) 1 MG tablet, Take 1 mg by mouth daily., Disp: , Rfl:  .   gabapentin (NEURONTIN) 300 MG capsule, Take 900 mg by mouth 3 (three) times daily. , Disp: , Rfl:  .  levothyroxine (SYNTHROID, LEVOTHROID) 25 MCG tablet, Take 25 mcg by mouth daily before breakfast. , Disp: , Rfl:  .  lisinopril (PRINIVIL,ZESTRIL) 20 MG tablet, Take by mouth., Disp: , Rfl:  .  LORazepam (ATIVAN) 0.5 MG tablet, Take by mouth., Disp: , Rfl:  .  montelukast (SINGULAIR) 10 MG tablet, Take 10 mg by mouth daily. , Disp: , Rfl:  .  mupirocin ointment (BACTROBAN) 2 %, Apply 1 application topically 3 (three) times daily., Disp: 22 g, Rfl: 0 .  niacin (NIASPAN) 500 MG CR tablet, Take 500 mg by mouth at bedtime., Disp: , Rfl:  .  QUEtiapine (SEROQUEL) 100 MG tablet, Take 100 mg by mouth at bedtime., Disp: , Rfl:  .  sennosides-docusate sodium (SENOKOT-S) 8.6-50 MG tablet, Take 2 tablets by mouth 2 (two) times daily., Disp: , Rfl:  .  traMADol (ULTRAM) 50 MG tablet, Take 1 tablet (50 mg total) by mouth 2 (two) times daily as needed for moderate pain or severe pain., Disp: 12 tablet, Rfl: 0  Past Medical History: Past Medical History:  Diagnosis Date  . 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 cause of his ankle break. awaiting neuro workup  . Hypertension   .  Hypothyroidism   . Multiple sclerosis ([place]) 1990  . Myocardial infarct ([place])   . Optic neuritis    x 3. takes steriods and it resolves.  . Seizures ([place])    as a child. diagnosed with MS and once treated, seizures resolved.  . Sleep apnea     Tobacco Use: Social History   Tobacco Use  Smoking Status Never Smoker  Smokeless Tobacco Current User  . Types: Snuff  Tobacco Comment   rarely uses smokeless tobacco    Labs: Recent Review Flowsheet Data    Labs for ITP Cardiac and Pulmonary Rehab Latest Ref Rng & Units 06/07/2017   Cholestrol 0 - 200 mg/dL 133   LDLCALC 0 - 99 mg/dL 55   HDL >40 mg/dL 54   Trlycerides <150 mg/dL 119       Exercise Target Goals:    Exercise Program Goal: Individual  exercise prescription set using results from initial 6 min walk test and THRR while considering  patient's activity barriers and safety.   Exercise Prescription Goal: Initial exercise prescription builds to 30-45 minutes a day of aerobic activity, 2-3 days per week.  Home exercise guidelines will be given to patient during program as part of exercise prescription that the participant will acknowledge.  Activity Barriers & Risk Stratification: Activity Barriers & Cardiac Risk Stratification - 07/08/17 1423      Activity Barriers & Cardiac Risk Stratification   Activity Barriers  Left Hip Replacement;Joint Problems;Muscular Weakness    Cardiac Risk Stratification  Moderate       6 Minute Walk: 6 Minute Walk    Row Name 07/08/17 1419         6 Minute Walk   Distance  1195 feet     Walk Time  6 minutes     # of Rest Breaks  0     MPH  2.26     METS  3.85     RPE  13     Perceived Dyspnea   0     Symptoms  Yes (comment)     Comments  L hip pain 8/10      Resting HR  98 bpm     Resting BP  130/80     Resting Oxygen Saturation   98 %     Exercise Oxygen Saturation  during 6 min walk  100 %     Max Ex. HR  114 bpm     Max Ex. BP  148/84     2 Minute Post BP  134/82        Oxygen Initial Assessment:   Oxygen Re-Evaluation:   Oxygen Discharge (Final Oxygen Re-Evaluation):   Initial Exercise Prescription: Initial Exercise Prescription - 07/08/17 1400      Date of Initial Exercise RX and Referring Provider   Date  07/08/17    Referring Provider  [facility]      Treadmill   MPH  2    Grade  2    Minutes  15    METs  3      Recumbant Bike   Level  3    RPM  60    Watts  50    Minutes  15    METs  3.5      NuStep   Level  4    SPM  80    Minutes  15    METs  3.5      Prescription Details   Frequency (times per  week)  3    Duration  Progress to 30 minutes of continuous aerobic without signs/symptoms of physical distress      Intensity   THRR 40-80% of Max  Heartrate  125-153    Ratings of Perceived Exertion  11-13    Perceived Dyspnea  0-4      Resistance Training   Training Prescription  Yes    Weight  4 lb    Reps  10-15       Perform Capillary Blood Glucose checks as needed.  Exercise Prescription Changes: Exercise Prescription Changes    Row Name 07/08/17 1300 07/22/17 0900 07/28/17 0800         Response to Exercise   Blood Pressure (Admit)  130/80  -  116/72     Blood Pressure (Exercise)  148/84  -  128/82     Blood Pressure (Exit)  134/82  -  112/62     Heart Rate (Admit)  99 bpm  -  104 bpm     Heart Rate (Exercise)  114 bpm  -  130 bpm     Heart Rate (Exit)  98 bpm  -  109 bpm     Oxygen Saturation (Admit)  98 %  -  -     Oxygen Saturation (Exit)  100 %  -  -     Rating of Perceived Exertion (Exercise)  13  -  13     Symptoms  -  -  none     Comments  -  -  second full day of exercise     Duration  -  -  Continue with 30 min of aerobic exercise without signs/symptoms of physical distress.     Intensity  -  -  THRR unchanged       Progression   Progression  -  -  Continue to progress workloads to maintain intensity without signs/symptoms of physical distress.     Average METs  -  -  3.14       Resistance Training   Training Prescription  -  -  Yes     Weight  -  -  4 lb     Reps  -  -  10-15       Interval Training   Interval Training  -  -  No       Treadmill   MPH  -  -  2     Grade  -  -  2     Minutes  -  -  15     METs  -  -  3.08       Recumbant Bike   Level  -  -  5     Watts  -  -  30     Minutes  -  -  15     METs  -  -  2.94       NuStep   Level  -  -  4     Minutes  -  -  15     METs  -  -  3.4       Home Exercise Plan   Plans to continue exercise at  -  Home (comment) walking, treadmill  Home (comment) walking, treadmill     Frequency  -  Add 3 additional days to program exercise sessions.  Add 3 additional days to program exercise sessions.     Initial Home Exercises  Provided  -   07/22/17  07/22/17        Exercise Comments: Exercise Comments    Row Name 07/15/17 [phone]           Exercise Comments  First full day of exercise!  Patient was oriented to gym and equipment including functions, settings, policies, and procedures.  Patient's individual exercise prescription and treatment plan were reviewed.  All starting workloads were established based on the results of the 6 minute walk test done at initial orientation visit.  The plan for exercise progression was also introduced and progression will be customized based on patient's performance and goals.          Exercise Goals and Review: Exercise Goals    Row Name 07/08/17 1418             Exercise Goals   Increase Physical Activity  Yes       Intervention  Provide advice, education, support and counseling about physical activity/exercise needs.;Develop an individualized exercise prescription for aerobic and resistive training based on initial evaluation findings, risk stratification, comorbidities and participant's personal goals.       Expected Outcomes  Short Term: Attend rehab on a regular basis to increase amount of physical activity.;Long Term: Add in home exercise to make exercise part of routine and to increase amount of physical activity.;Long Term: Exercising regularly at least 3-5 days a week.       Increase Strength and Stamina  Yes       Intervention  Provide advice, education, support and counseling about physical activity/exercise needs.;Develop an individualized exercise prescription for aerobic and resistive training based on initial evaluation findings, risk stratification, comorbidities and participant's personal goals.       Expected Outcomes  Short Term: Increase workloads from initial exercise prescription for resistance, speed, and METs.;Short Term: Perform resistance training exercises routinely during rehab and add in resistance training at home;Long Term: Improve cardiorespiratory fitness,  muscular endurance and strength as measured by increased METs and functional capacity (6MWT)       Able to understand and use rate of perceived exertion (RPE) scale  Yes       Intervention  Provide education and explanation on how to use RPE scale       Expected Outcomes  Short Term: Able to use RPE daily in rehab to express subjective intensity level;Long Term:  Able to use RPE to guide intensity level when exercising independently       Able to understand and use Dyspnea scale  Yes       Intervention  Provide education and explanation on how to use Dyspnea scale       Expected Outcomes  Short Term: Able to use Dyspnea scale daily in rehab to express subjective sense of shortness of breath during exertion;Long Term: Able to use Dyspnea scale to guide intensity level when exercising independently       Knowledge and understanding of Target Heart Rate Range (THRR)  Yes       Intervention  Provide education and explanation of THRR including how the numbers were predicted and where they are located for reference       Expected Outcomes  Short Term: Able to state/look up THRR;Long Term: Able to use THRR to govern intensity when exercising independently;Short Term: Able to use daily as guideline for intensity in rehab       Able to check pulse independently  Yes       Intervention  Provide education  and demonstration on how to check pulse in carotid and radial arteries.;Review the importance of being able to check your own pulse for safety during independent exercise       Expected Outcomes  Short Term: Able to explain why pulse checking is important during independent exercise;Long Term: Able to check pulse independently and accurately       Understanding of Exercise Prescription  Yes          Exercise Goals Re-Evaluation : Exercise Goals Re-Evaluation    Row Name 07/15/17 0851 07/22/17 0921 07/28/17 0843 08/10/17 1043       Exercise Goal Re-Evaluation   Exercise Goals Review  Increase Physical  Activity;Increase Strength and Stamina;Able to understand and use rate of perceived exertion (RPE) scale;Able to understand and use Dyspnea scale;Knowledge and understanding of Target Heart Rate Range (THRR);Understanding of Exercise Prescription  Increase Physical Activity;Understanding of Exercise Prescription;Increase Strength and Stamina;Knowledge and understanding of Target Heart Rate Range (THRR);Able to understand and use rate of perceived exertion (RPE) scale;Able to check pulse independently  Increase Physical Activity;Understanding of Exercise Prescription;Increase Strength and Stamina  -    Comments  Reviewed RPE scale, THR and program prescription with pt today.  Pt voiced understanding and was given a copy of goals to take home.   Reviewed home exercise with pt today.  Pt plans to walk and use treadmill for exercise.  Reviewed THR, pulse, RPE, sign and symptoms, NTG use, and when to call 911 or MD.  Also discussed weather considerations and indoor options.  Pt voiced understanding.  Nicki Reaper has been doing well in rehab.  He has completed two full days of exercise.  We will continue to monitor his progress.   Out since last review    Expected Outcomes  Short: Use RPE daily to regulate intensity.  Long: Follow program prescription in THR.  Short: Start walking and using treadmill at home.  Long: Continue to exericse more to increase activity.   Short: Return to rehab regularly.  Long: Continue to work to build strength and stamina.   -       Discharge Exercise Prescription (Final Exercise Prescription Changes): Exercise Prescription Changes - 07/28/17 0800      Response to Exercise   Blood Pressure (Admit)  116/72    Blood Pressure (Exercise)  128/82    Blood Pressure (Exit)  112/62    Heart Rate (Admit)  104 bpm    Heart Rate (Exercise)  130 bpm    Heart Rate (Exit)  109 bpm    Rating of Perceived Exertion (Exercise)  13    Symptoms  none    Comments  second full day of exercise     Duration  Continue with 30 min of aerobic exercise without signs/symptoms of physical distress.    Intensity  THRR unchanged      Progression   Progression  Continue to progress workloads to maintain intensity without signs/symptoms of physical distress.    Average METs  3.14      Resistance Training   Training Prescription  Yes    Weight  4 lb    Reps  10-15      Interval Training   Interval Training  No      Treadmill   MPH  2    Grade  2    Minutes  15    METs  3.08      Recumbant Bike   Level  5    Watts  30  Minutes  15    METs  2.94      NuStep   Level  4    Minutes  15    METs  3.4      Home Exercise Plan   Plans to continue exercise at  Home (comment) walking, treadmill    Frequency  Add 3 additional days to program exercise sessions.    Initial Home Exercises Provided  07/22/17       Nutrition:  Target Goals: Understanding of nutrition guidelines, daily intake of sodium <1568m, cholesterol <2080m calories 30% from fat and 7% or less from saturated fats, daily to have 5 or more servings of fruits and vegetables.  Biometrics: Pre Biometrics - 07/08/17 1417      Pre Biometrics   Height  6' (1.829 m)    Weight  220 lb 6.4 oz (100 kg)    Waist Circumference  41 inches    Hip Circumference  44.5 inches    Waist to Hip Ratio  0.92 %    BMI (Calculated)  29.89    Single Leg Stand  1.48 seconds        Nutrition Therapy Plan and Nutrition Goals: Nutrition Therapy & Goals - 07/08/17 1416      Intervention Plan   Intervention  Prescribe, educate and counsel regarding individualized specific dietary modifications aiming towards targeted core components such as weight, hypertension, lipid management, diabetes, heart failure and other comorbidities.;Nutrition handout(s) given to patient.    Expected Outcomes  Short Term Goal: Understand basic principles of dietary content, such as calories, fat, sodium, cholesterol and nutrients.;Short Term Goal: A plan has  been developed with personal nutrition goals set during dietitian appointment.;Long Term Goal: Adherence to prescribed nutrition plan.       Nutrition Assessments: Nutrition Assessments - 07/08/17 1417      MEDFICTS Scores   Pre Score  74       Nutrition Goals Re-Evaluation:   Nutrition Goals Discharge (Final Nutrition Goals Re-Evaluation):   Psychosocial: Target Goals: Acknowledge presence or absence of significant depression and/or stress, maximize coping skills, provide positive support system. Participant is able to verbalize types and ability to use techniques and skills needed for reducing stress and depression.   Initial Review & Psychosocial Screening: Initial Psych Review & Screening - 07/08/17 1417      Initial Review   Current issues with  Current Depression;History of Depression;Current Stress Concerns    Source of Stress Concerns  Unable to perform yard/household activities;Chronic Illness      Family Dynamics   Good Support System?  Yes brother      Barriers   Psychosocial barriers to participate in program  There are no identifiable barriers or psychosocial needs.;The patient should benefit from training in stress management and relaxation.      Screening Interventions   Interventions  Provide feedback about the scores to participant;Encouraged to exercise;Program counselor consult;To provide support and resources with identified psychosocial needs    Expected Outcomes  Short Term goal: Utilizing psychosocial counselor, staff and physician to assist with identification of specific Stressors or current issues interfering with healing process. Setting desired goal for each stressor or current issue identified.;Long Term Goal: Stressors or current issues are controlled or eliminated.;Short Term goal: Identification and review with participant of any Quality of Life or Depression concerns found by scoring the questionnaire.;Long Term goal: The participant improves  quality of Life and PHQ9 Scores as seen by post scores and/or verbalization of changes  Quality of Life Scores:  Quality of Life - 07/08/17 1419      Quality of Life Scores   Health/Function Pre  20 %    Socioeconomic Pre  25.07 %    Psych/Spiritual Pre  23.21 %    Family Pre  28.3 %    GLOBAL Pre  22.93 %      Scores of 19 and below usually indicate a poorer quality of life in these areas.  A difference of  2-3 points is a clinically meaningful difference.  A difference of 2-3 points in the total score of the Quality of Life Index has been associated with significant improvement in overall quality of life, self-image, physical symptoms, and general health in studies assessing change in quality of life.  PHQ-9: Recent Review Flowsheet Data    Depression screen [facility] 2/9 07/08/2017   Decreased Interest 2   Down, Depressed, Hopeless 3   PHQ - 2 Score 5   Altered sleeping 0   Tired, decreased energy 3   Change in appetite 3   Feeling bad or failure about yourself  2   Trouble concentrating 0   Moving slowly or fidgety/restless 0   Suicidal thoughts 0   PHQ-9 Score 13   Difficult doing work/chores Very difficult     Interpretation of Total Score  Total Score Depression Severity:  1-4 = Minimal depression, 5-9 = Mild depression, 10-14 = Moderate depression, 15-19 = Moderately severe depression, 20-27 = Severe depression   Psychosocial Evaluation and Intervention: Psychosocial Evaluation - 07/22/17 1016      Psychosocial Evaluation & Interventions   Interventions  Encouraged to exercise with the program and follow exercise prescription;Relaxation education;Stress management education    Comments  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  He is a 52 year old who has a history of heart disease with a CABG in 2015 and recent stents in the past month or so.  He lives alone but reports a strong support system with a brother and "good  neighbors" locally.  He has struggled with MS for almost 25 years and has mood issues that are being addressed with medication.  Scott reports sleeping well and having a good appetite.  He has a history of depression since 1993 and reports his medications are helping with these symptoms currently.  His current stressors are his health; his mental health and finances since he has been disabled since 2003.      Expected Outcomes  Nicki Reaper will benefit from consistent exercise to achieve his stated goals.  The educational and psychoeducational components of this program will be helpful in understanding and coping more positively with his condition and life in general.      Continue Psychosocial Services   Follow up required by staff       Psychosocial Re-Evaluation:   Psychosocial Discharge (Final Psychosocial Re-Evaluation):   Vocational Rehabilitation: Provide vocational rehab assistance to qualifying candidates.   Vocational Rehab Evaluation & Intervention: Vocational Rehab - 07/08/17 1422      Initial Vocational Rehab Evaluation & Intervention   Assessment shows need for Vocational Rehabilitation  No       Education: Education Goals: Education classes will be provided on a variety of topics geared toward better understanding of heart health and risk factor modification. Participant will state understanding/return demonstration of topics presented as noted by education test scores.  Learning Barriers/Preferences: Learning Barriers/Preferences - 07/08/17 1420      Learning Barriers/Preferences  Learning Barriers  None    Learning Preferences  Verbal Instruction       Education Topics:  AED/CPR: - Group verbal and written instruction with the use of models to demonstrate the basic use of the AED with the basic ABC's of resuscitation.   General Nutrition Guidelines/Fats and Fiber: -Group instruction provided by verbal, written material, models and posters to present the general  guidelines for heart healthy nutrition. Gives an explanation and review of dietary fats and fiber.   Controlling Sodium/Reading Food Labels: -Group verbal and written material supporting the discussion of sodium use in heart healthy nutrition. Review and explanation with models, verbal and written materials for utilization of the food label.   Cardiac Rehab from 08/24/2017 in [facility] Cardiac and Pulmonary Rehab  Date  08/24/17  Educator  PI  Instruction Review Code  1- Verbalizes Understanding      Exercise Physiology & General Exercise Guidelines: - Group verbal and written instruction with models to review the exercise physiology of the cardiovascular system and associated critical values. Provides general exercise guidelines with specific guidelines to those with heart or lung disease.    Cardiac Rehab from 08/24/2017 in [facility] Cardiac and Pulmonary Rehab  Date  07/15/17  Educator  [facility]  Instruction Review Code  1- Verbalizes Understanding      Aerobic Exercise & Resistance Training: - Gives group verbal and written instruction on the various components of exercise. Focuses on aerobic and resistive training programs and the benefits of this training and how to safely progress through these programs..   Flexibility, Balance, Mind/Body Relaxation: Provides group verbal/written instruction on the benefits of flexibility and balance training, including mind/body exercise modes such as yoga, pilates and tai chi.  Demonstration and skill practice provided.   Cardiac Rehab from 08/24/2017 in [facility] Cardiac and Pulmonary Rehab  Date  07/22/17  Educator  AS  Instruction Review Code  1- Verbalizes Understanding      Stress and Anxiety: - Provides group verbal and written instruction about the health risks of elevated stress and causes of high stress.  Discuss the correlation between heart/lung disease and anxiety and treatment options. Review healthy ways to manage with stress and  anxiety.   Depression: - Provides group verbal and written instruction on the correlation between heart/lung disease and depressed mood, treatment options, and the stigmas associated with seeking treatment.   Anatomy & Physiology of the Heart: - Group verbal and written instruction and models provide basic cardiac anatomy and physiology, with the coronary electrical and arterial systems. Review of Valvular disease and Heart Failure   Cardiac Procedures: - Group verbal and written instruction to review commonly prescribed medications for heart disease. Reviews the medication, class of the drug, and side effects. Includes the steps to properly store meds and maintain the prescription regimen. (beta blockers and nitrates)   Cardiac Medications I: - Group verbal and written instruction to review commonly prescribed medications for heart disease. Reviews the medication, class of the drug, and side effects. Includes the steps to properly store meds and maintain the prescription regimen.   Cardiac Medications II: -Group verbal and written instruction to review commonly prescribed medications for heart disease. Reviews the medication, class of the drug, and side effects. (all other drug classes)    Go Sex-Intimacy & Heart Disease, Get SMART - Goal Setting: - Group verbal and written instruction through game format to discuss heart disease and the return to sexual intimacy. Provides group verbal and written material to discuss  and apply goal setting through the application of the S.M.A.R.T. Method.   Other Matters of the Heart: - Provides group verbal, written materials and models to describe Stable Angina and Peripheral Artery. Includes description of the disease process and treatment options available to the cardiac patient.   Exercise & Equipment Safety: - Individual verbal instruction and demonstration of equipment use and safety with use of the equipment.   Cardiac Rehab from 08/24/2017  in [facility] Cardiac and Pulmonary Rehab  Date  07/08/17  Educator  [facility]  Instruction Review Code  1- Verbalizes Understanding      Infection Prevention: - Provides verbal and written material to individual with discussion of infection control including proper hand washing and proper equipment cleaning during exercise session.   Cardiac Rehab from 08/24/2017 in [facility] Cardiac and Pulmonary Rehab  Date  07/08/17  Educator  [facility]  Instruction Review Code  1- Verbalizes Understanding      Falls Prevention: - Provides verbal and written material to individual with discussion of falls prevention and safety.   Cardiac Rehab from 08/24/2017 in [facility] Cardiac and Pulmonary Rehab  Date  07/08/17  Educator  [facility]  Instruction Review Code  1- Verbalizes Understanding      Diabetes: - Individual verbal and written instruction to review signs/symptoms of diabetes, desired ranges of glucose level fasting, after meals and with exercise. Acknowledge that pre and post exercise glucose checks will be done for 3 sessions at entry of program.   Know Your Numbers and Risk Factors: -Group verbal and written instruction about important numbers in your health.  Discussion of what are risk factors and how they play a role in the disease process.  Review of Cholesterol, Blood Pressure, Diabetes, and BMI and the role they play in your overall health.   Sleep Hygiene: -Provides group verbal and written instruction about how sleep can affect your health.  Define sleep hygiene, discuss sleep cycles and impact of sleep habits. Review good sleep hygiene tips.    Other: -Provides group and verbal instruction on various topics (see comments)   Knowledge Questionnaire Score: Knowledge Questionnaire Score - 07/08/17 1421      Knowledge Questionnaire Score   Pre Score  21/28 correct answers reviewed with Herbie Baltimore       Core Components/Risk Factors/Patient Goals at Admission: Personal Goals and Risk Factors at Admission -  07/08/17 1409      Core Components/Risk Factors/Patient Goals on Admission    Weight Management  Yes;Weight Loss    Intervention  Weight Management: Develop a combined nutrition and exercise program designed to reach desired caloric intake, while maintaining appropriate intake of nutrient and fiber, sodium and fats, and appropriate energy expenditure required for the weight goal.;Weight Management: Provide education and appropriate resources to help participant work on and attain dietary goals.;Weight Management/Obesity: Establish reasonable short term and long term weight goals.    Admit Weight  220 lb (99.8 kg)    Goal Weight: Short Term  215 lb (97.5 kg)    Goal Weight: Long Term  210 lb (95.3 kg)    Expected Outcomes  Short Term: Continue to assess and modify interventions until short term weight is achieved;Long Term: Adherence to nutrition and physical activity/exercise program aimed toward attainment of established weight goal;Weight Maintenance: Understanding of the daily nutrition guidelines, which includes 25-35% calories from fat, 7% or less cal from saturated fats, less than 216m cholesterol, less than 1.5gm of sodium, & 5 or more servings of fruits and vegetables daily;Weight  Loss: Understanding of general recommendations for a balanced deficit meal plan, which promotes 1-2 lb weight loss per week and includes a negative energy balance of [phone] kcal/d;Understanding recommendations for meals to include 15-35% energy as protein, 25-35% energy from fat, 35-60% energy from carbohydrates, less than 235m of dietary cholesterol, 20-35 gm of total fiber daily;Understanding of distribution of calorie intake throughout the day with the consumption of 4-5 meals/snacks    Hypertension  Yes    Intervention  Provide education on lifestyle modifcations including regular physical activity/exercise, weight management, moderate sodium restriction and increased consumption of fresh fruit, vegetables, and  low fat dairy, alcohol moderation, and smoking cessation.;Monitor prescription use compliance.    Expected Outcomes  Short Term: Continued assessment and intervention until BP is < 140/912mHG in hypertensive participants. < 130/8078mG in hypertensive participants with diabetes, heart failure or chronic kidney disease.;Long Term: Maintenance of blood pressure at goal levels.    Stress  Yes Chronic illness (MS) and other health issues. He is also unable to do things around the house he wished he could    Intervention  Offer individual and/or small group education and counseling on adjustment to heart disease, stress management and health-related lifestyle change. Teach and support self-help strategies.;Refer participants experiencing significant psychosocial distress to appropriate mental health specialists for further evaluation and treatment. When possible, include family members and significant others in education/counseling sessions.    Expected Outcomes  Short Term: Participant demonstrates changes in health-related behavior, relaxation and other stress management skills, ability to obtain effective social support, and compliance with psychotropic medications if prescribed.;Long Term: Emotional wellbeing is indicated by absence of clinically significant psychosocial distress or social isolation.       Core Components/Risk Factors/Patient Goals Review:    Core Components/Risk Factors/Patient Goals at Discharge (Final Review):    ITP Comments: ITP Comments    Row Name 07/08/17 1403 07/21/17 0632620/06/19 0844 08/10/17 1043 08/18/17 0635   ITP Comments  Med Review completed. Initial ITP created. [facility]  30 day review completed. Continue with ITP unless diercted changes per Medical Director. New to program  ScoNicki Reapernt to the ED on Monday after a fall at home that cut his leg.   Called to check on Scott after his fall.  He is feeling better and all healed up. He is  hoping to return to rehab next week.   30 Day review. Continue with ITP unless directed changes per Medical Director review.   No visit in March   Row Name 08/24/17 1524 09/09/17 0921         ITP Comments  Called to check on status of return of pt.  He has not attended since 07/22/17 before his fall. Left message.   ScoNicki Reapers not attended rehab since 07/22/17.  Sent letter to discharge.  Discharge ITP sent and signed by Dr. MilSabra HeckDischarge Summary routed to PCP and cardiologist.         Comments: Discharge ITP

## 2017-09-22 ENCOUNTER — Telehealth (INDEPENDENT_AMBULATORY_CARE_PROVIDER_SITE_OTHER): Payer: Medicare Other

## 2017-09-22 ENCOUNTER — Ambulatory Visit
Admission: EM | Admit: 2017-09-22 | Discharge: 2017-09-22 | Disposition: A | Discharge status: Home / Self Care | Payer: Medicare Other | Attending: Family Medicine | Admitting: Family Medicine

## 2017-09-22 DIAGNOSIS — N3001 Acute cystitis with hematuria: Secondary | ICD-10-CM | POA: Diagnosis not present

## 2017-09-22 DIAGNOSIS — E039 Hypothyroidism, unspecified: Secondary | ICD-10-CM | POA: Diagnosis not present

## 2017-09-22 DIAGNOSIS — Z951 Presence of aortocoronary bypass graft: Secondary | ICD-10-CM | POA: Diagnosis not present

## 2017-09-22 DIAGNOSIS — I10 Essential (primary) hypertension: Secondary | ICD-10-CM | POA: Insufficient documentation

## 2017-09-22 DIAGNOSIS — I252 Old myocardial infarction: Secondary | ICD-10-CM | POA: Insufficient documentation

## 2017-09-22 DIAGNOSIS — G35 Multiple sclerosis: Secondary | ICD-10-CM | POA: Insufficient documentation

## 2017-09-22 DIAGNOSIS — Z7902 Long term (current) use of antithrombotics/antiplatelets: Secondary | ICD-10-CM | POA: Insufficient documentation

## 2017-09-22 DIAGNOSIS — Z880 Allergy status to penicillin: Secondary | ICD-10-CM | POA: Diagnosis not present

## 2017-09-22 DIAGNOSIS — Z7982 Long term (current) use of aspirin: Secondary | ICD-10-CM | POA: Diagnosis not present

## 2017-09-22 DIAGNOSIS — Z79890 Hormone replacement therapy (postmenopausal): Secondary | ICD-10-CM | POA: Insufficient documentation

## 2017-09-22 DIAGNOSIS — G4730 Sleep apnea, unspecified: Secondary | ICD-10-CM | POA: Diagnosis not present

## 2017-09-22 DIAGNOSIS — Z79899 Other long term (current) drug therapy: Secondary | ICD-10-CM | POA: Diagnosis not present

## 2017-09-22 DIAGNOSIS — Z888 Allergy status to other drugs, medicaments and biological substances status: Secondary | ICD-10-CM | POA: Diagnosis not present

## 2017-09-22 DIAGNOSIS — Z955 Presence of coronary angioplasty implant and graft: Secondary | ICD-10-CM | POA: Insufficient documentation

## 2017-09-22 DIAGNOSIS — N390 Urinary tract infection, site not specified: Secondary | ICD-10-CM | POA: Diagnosis present

## 2017-09-22 LAB — URINALYSIS, COMPLETE (UACMP) WITH MICROSCOPIC
BACTERIA UA: NONE SEEN
Squamous Epithelial / LPF: NONE SEEN (ref 0–5)

## 2017-09-22 MED ORDER — NITROFURANTOIN MONOHYD MACRO 100 MG PO CAPS: 100.0000 mg | ORAL_CAPSULE | Freq: Two times a day (BID) | ORAL | 0 refills | Status: DC

## 2017-09-22 NOTE — ED Triage Notes (Signed)
Pt here for uti. Cody Burns he is having dysuria and started yesterday. Said he does use a cath. when needed. Also complains of frequency and orange colored urine.

## 2017-09-22 NOTE — ED Provider Notes (Signed)
[facility]    CSN: [id number] Arrival date & time: 09/22/17  0945     History   Chief Complaint Chief Complaint  Patient presents with  . Urinary Tract Infection    HPI Cody Burns is a 52 y.o. male.   The history is provided by the patient.  Urinary Tract Infection  Pertinent negatives include no chest pain, no abdominal pain, no headaches and no shortness of breath.  Dysuria  This is a new problem. The current episode started yesterday. The problem occurs constantly. The problem has been gradually worsening. Pertinent negatives include no chest pain, no abdominal pain, no headaches and no shortness of breath. Nothing relieves the symptoms.    Past Medical History:  Diagnosis Date  . 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 cause of his ankle break. awaiting neuro workup  . Hypertension   . Hypothyroidism   . Multiple sclerosis (HCC) 1990  . Myocardial infarct (HCC)   . Optic neuritis    x 3. takes steriods and it resolves.  . Seizures (HCC)    as a child. diagnosed with MS and once treated, seizures resolved.  . Sleep apnea     Patient Active Problem List   Diagnosis Date Noted  . Acute renal failure (ARF) (HCC) 06/05/2017  . Left hip pain 04/05/2017  . Acute kidney injury (HCC) 02/23/2017  . Zoster 02/05/2017  . Syncope 02/03/2017  . Stenosis of carotid artery 01/05/2017  . Carotid stenosis, asymptomatic, right 11/16/2016  . 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 Hx of ischemic left MCA stroke 11/13/2016  . Hypertension, essential 11/13/2016  . Multiple sclerosis, relapsing-remitting (HCC) 11/13/2016  . Carotid occlusion, left 11/13/2016  . Hyperlipidemia LDL goal <70 11/13/2016  . Slurred speech 11/13/2016  . Closed trimalleolar fracture of right ankle 09/25/2016  . Closed right ankle fracture 09/22/2016    Past Surgical History:  Procedure Laterality Date  . CORONARY  ARTERY BYPASS GRAFT  2015  . CORONARY STENT INTERVENTION N/A 06/07/2017   Procedure: CORONARY STENT INTERVENTION;  Surgeon: Alwyn Pea, MD;  Location: [facility] INVASIVE CV LAB;  Service: Cardiovascular;  Laterality: N/A;  . JOINT REPLACEMENT Left 2007   left hip replacement  . LEFT HEART CATH AND CORS/GRAFTS ANGIOGRAPHY N/A 06/07/2017   Procedure: LEFT HEART CATH AND CORS/GRAFTS ANGIOGRAPHY;  Surgeon: Lamar Blinks, MD;  Location: [facility] INVASIVE CV LAB;  Service: Cardiovascular;  Laterality: N/A;  . ORIF ANKLE FRACTURE Right 09/22/2016   Procedure: OPEN REDUCTION INTERNAL FIXATION (ORIF) ANKLE FRACTURE;  Surgeon: Christena Flake, MD;  Location: [facility] ORS;  Service: Orthopedics;  Laterality: Right;  . quadruple cardiac bypass  2015       Home Medications    Prior to Admission medications   Medication Sig Start Date End Date Taking? Authorizing Provider  aspirin 81 MG chewable tablet Chew by mouth. 11/24/16 11/24/17 Yes [provider]  atorvastatin (LIPITOR) 80 MG tablet Take by mouth. 11/24/16 11/24/17 Yes [provider]  buPROPion (WELLBUTRIN XL) 300 MG 24 hr tablet Take by mouth. 11/25/16 11/25/17 Yes [provider]  clopidogrel (PLAVIX) 75 MG tablet Take by mouth. 11/25/16 11/25/17 Yes [provider]  Dimethyl Fumarate (TECFIDERA) 240 MG CPDR Take by mouth. 11/24/16  Yes [provider]  folic acid (FOLVITE) 1 MG tablet Take by mouth. 11/25/16 11/25/17 Yes [provider]  gabapentin (NEURONTIN) 300 MG capsule Take by mouth. 11/24/16 11/24/17 Yes [provider]  montelukast (SINGULAIR) 10 MG tablet Take by mouth. 07/09/09  Yes [provider]  niacin (NIASPAN) 500 MG CR tablet Take by mouth. 11/24/16 11/24/17 Yes [provider]  QUEtiapine (SEROQUEL) 100 MG tablet Take by mouth. 02/06/17  Yes [provider]  senna-docusate (SENOKOT-S) 8.6-50 MG tablet Take by mouth. 11/24/16 11/24/17 Yes [provider]  vitamin B-12  (CYANOCOBALAMIN) 1000 MCG tablet Take by mouth. 07/09/09  Yes [provider]  amLODipine (NORVASC) 5 MG tablet Take by mouth.    [provider]  aspirin EC 81 MG tablet Take 81 mg by mouth daily.    [provider]  atorvastatin (LIPITOR) 80 MG tablet Take 80 mg by mouth at bedtime.    [provider]  benzonatate (TESSALON PERLES) 100 MG capsule Take 1 capsule (100 mg total) by mouth 3 (three) times daily as needed for cough. 09/07/17   Renford Dills, NP  buPROPion (WELLBUTRIN XL) 300 MG 24 hr tablet Take 300 mg by mouth daily.    [provider]  calcium-vitamin D (OSCAL WITH D) 500-200 MG-UNIT tablet Take 1 tablet by mouth daily.    [provider]  calcium-vitamin D (OSCAL WITH D) 500-200 MG-UNIT TABS tablet Take by mouth.    [provider]  clindamycin (CLEOCIN) 150 MG capsule Take by mouth.    [provider]  clopidogrel (PLAVIX) 75 MG tablet Take 75 mg by mouth daily. 11/25/16 11/25/17  [provider]  cyanocobalamin 1000 MCG tablet Take 1,000 mcg by mouth daily.    [provider]  cyclobenzaprine (FLEXERIL) 10 MG tablet Take 1 tablet (10 mg total) by mouth 3 (three) times daily. 06/11/17   Shaune Pollack, MD  cyclobenzaprine (FLEXERIL) 10 MG tablet Take by mouth.    [provider]  Dimethyl Fumarate (TECFIDERA) 240 MG CPDR Take 240 mg by mouth 2 (two) times daily.    [provider]  doxycycline (VIBRAMYCIN) 100 MG capsule Take 1 capsule (100 mg total) by mouth 2 (two) times daily. 09/07/17   Renford Dills, NP  escitalopram (LEXAPRO) 20 MG tablet Take 20 mg by mouth daily.    [provider]  escitalopram (LEXAPRO) 20 MG tablet Take by mouth.    [provider]  folic acid (FOLVITE) 1 MG tablet Take 1 mg by mouth daily. 11/25/16 11/25/17  [provider]  gabapentin (NEURONTIN) 300 MG capsule Take 900 mg by mouth 3 (three) times daily.     [provider]  levothyroxine (SYNTHROID, LEVOTHROID) 25 MCG tablet Take 25 mcg by mouth daily before breakfast.     [provider]  levothyroxine (SYNTHROID, LEVOTHROID) 25 MCG tablet Take by mouth.    [provider]  lisinopril (PRINIVIL,ZESTRIL) 20 MG tablet Take by mouth.    [provider]  LORazepam (ATIVAN) 0.5 MG tablet Take by mouth.    [provider]  montelukast (SINGULAIR) 10 MG tablet Take 10 mg by mouth daily.     [provider]  mupirocin ointment (BACTROBAN) 2 % Apply 1 application topically 3 (three) times daily. 09/02/17   Lutricia Feil, PA-C  niacin (NIASPAN) 500 MG CR tablet Take 500 mg by mouth at bedtime. 11/24/16 11/24/17  [provider]  nitrofurantoin, macrocrystal-monohydrate, (MACROBID) 100 MG capsule Take 1 capsule (100 mg total) by mouth 2 (two) times daily. 09/22/17   Payton Mccallum, MD  oxyCODONE (OXY IR/ROXICODONE) 5 MG immediate release tablet Take by mouth.    [provider]  QUEtiapine (SEROQUEL) 100 MG tablet Take 100 mg by mouth at bedtime.    [provider]  sennosides-docusate sodium (SENOKOT-S) 8.6-50 MG tablet Take 2 tablets by mouth 2 (two) times daily.    [provider]  traMADol (ULTRAM) 50 MG tablet Take 1 tablet (50 mg total) by mouth 2 (two) times daily as needed for moderate pain or severe pain. 06/11/17   Shaune Pollack, MD  traMADol Janean Sark) 50 MG tablet Take by mouth.    [provider]    Family History Family History  Problem Relation Age of Onset  . Cancer Mother   . Other Father     Social History Social History   Tobacco Use  . Smoking status: Never Smoker  . Smokeless tobacco: Current User    Types: Snuff  . Tobacco comment: rarely uses smokeless tobacco  Substance Use Topics  . Alcohol use: Yes    Comment: maybe a 6 pack per week  . Drug use: No     Allergies   Hydrochlorothiazide w-triamterene; Metoprolol; and Penicillins   Review of  Systems Review of Systems  Respiratory: Negative for shortness of breath.   Cardiovascular: Negative for chest pain.  Gastrointestinal: Negative for abdominal pain.  Genitourinary: Positive for dysuria.  Neurological: Negative for headaches.     Physical Exam Triage Vital Signs ED Triage Vitals  Enc Vitals Group     BP 09/22/17 1005 (!) 140/115     Pulse Rate 09/22/17 1005 (!) 105     Resp 09/22/17 1005 18     Temp 09/22/17 1005 97.6 F (36.4 C)     Temp Source 09/22/17 1005 Oral     SpO2 09/22/17 1005 100 %     Weight --      Height --      Head Circumference --      Peak Flow --      Pain Score 09/22/17 1007 7     Pain Loc --      Pain Edu? --      Excl. in GC? --    No data found.  Updated Vital Signs BP (!) 140/115 (BP Location: Left Arm)   Pulse (!) 105   Temp 97.6 F (36.4 C) (Oral)   Resp 18   SpO2 100%   Visual Acuity Right Eye Distance:   Left Eye Distance:   Bilateral Distance:    Right Eye Near:   Left Eye Near:    Bilateral Near:     Physical Exam  Constitutional: He is oriented to person, place, and time. He appears well-developed and well-nourished. No distress.  HENT:  Head: Normocephalic and atraumatic.  Cardiovascular: Normal rate, regular rhythm and normal heart sounds.  Pulmonary/Chest: Effort normal and breath sounds normal. No respiratory distress. He has no wheezes. He has no rales.  Abdominal: Soft. Bowel sounds are normal. He exhibits no distension and no mass. There is no tenderness. There is no rebound and no guarding.  Neurological: He is alert and oriented to person, place, and time.  Skin: No rash noted. He is not diaphoretic.  Nursing note and vitals reviewed.    UC Treatments / Results  Labs (all labs ordered are listed, but only abnormal results are displayed) Labs Reviewed  URINALYSIS, COMPLETE (UACMP) WITH MICROSCOPIC - Abnormal; Notable for the following components:      Result Value   Color, Urine RED (*)     APPearance CLOUDY (*)    Glucose, UA   (*)  Value: TEST NOT REPORTED DUE TO COLOR INTERFERENCE OF URINE PIGMENT   Hgb urine dipstick   (*)    Value: TEST NOT REPORTED DUE TO COLOR INTERFERENCE OF URINE PIGMENT   Bilirubin Urine   (*)    Value: TEST NOT REPORTED DUE TO COLOR INTERFERENCE OF URINE PIGMENT   Ketones, ur   (*)    Value: TEST NOT REPORTED DUE TO COLOR INTERFERENCE OF URINE PIGMENT   Protein, ur   (*)    Value: TEST NOT REPORTED DUE TO COLOR INTERFERENCE OF URINE PIGMENT   Nitrite   (*)    Value: TEST NOT REPORTED DUE TO COLOR INTERFERENCE OF URINE PIGMENT   Leukocytes, UA   (*)    Value: TEST NOT REPORTED DUE TO COLOR INTERFERENCE OF URINE PIGMENT   All other components within normal limits    EKG None  Radiology No results found.  Procedures Procedures (including critical care time)  Medications Ordered in UC Medications - No data to display  Initial Impression / Assessment and Plan / UC Course  I have reviewed the triage vital signs and the nursing notes.  Pertinent labs & imaging results that were available during my care of the patient were reviewed by me and considered in my medical decision making (see chart for details).     Final Clinical Impressions(s) / UC Diagnoses   Final diagnoses:  Acute cystitis with hematuria    ED Prescriptions    Medication Sig Dispense Auth. Provider   nitrofurantoin, macrocrystal-monohydrate, (MACROBID) 100 MG capsule Take 1 capsule (100 mg total) by mouth 2 (two) times daily. 14 capsule Payton Mccallum, MD     1. Lab results and diagnosis reviewed with patient 2. rx as per orders above; reviewed possible side effects, interactions, risks and benefits  3. Recommend supportive treatment with increased fluids, water intake, otc analgesics prn 4. Follow-up prn if symptoms worsen or don't improve    Controlled Substance Prescriptions [place] Controlled Substance Registry consulted? Not Applicable   Payton Mccallum,  MD 09/22/17 [phone]

## 2017-09-23 LAB — URINE CULTURE: Culture: NO GROWTH

## 2017-12-28 ENCOUNTER — Encounter: Payer: Self-pay | Admitting: Neurology

## 2017-12-28 ENCOUNTER — Ambulatory Visit (INDEPENDENT_AMBULATORY_CARE_PROVIDER_SITE_OTHER): Payer: Medicare Other | Admitting: Neurology

## 2017-12-28 VITALS — BP 181/100 | HR 65 | Ht 72.0 in | Wt 229.5 lb

## 2017-12-28 DIAGNOSIS — Z5181 Encounter for therapeutic drug level monitoring: Secondary | ICD-10-CM

## 2017-12-28 DIAGNOSIS — G35 Multiple sclerosis: Secondary | ICD-10-CM

## 2017-12-28 NOTE — Patient Instructions (Signed)
We will try to get started on the Ocrevus in the near future.

## 2017-12-28 NOTE — Progress Notes (Signed)
Reason for visit: Multiple sclerosis   Referring physician: Dr. Larene Pickett is a 52 y.o. male  History of present illness:  Cody Burns is a 53 year old right-handed white male with a history of multiple sclerosis that was initially diagnosed in 68.  The patient indicated that the presentation of the multiple sclerosis was associated with numbness and weakness on the left side of the body and a left-sided optic neuritis.  Over the years, the patient has eventually developed bilateral optic neuritis.  He initially was treated with Betaseron, then he switched to Copaxone, eventually went on Rebif and then Avonex.  Most recently, he has been on Tecfidera, he has been taking this medication for 2 years.  He has had progression of his symptoms on Tecfidera, his symptoms are now progressing slowly, and are not associated with individual relapses.  He has had some change in his speech over the last 1 year, he has had increasing problems with spasticity of the legs, and difficulty with walking.  He has had episodes of urinary incontinence.  He does fall on occasion, he has fallen 4 times in the last year.  He reports a mild short-term memory problem.  He is followed through the [facility] hospital in [place], [place].  He is referred to this practice for further evaluation, his neurologist has suggested Ocrevus for his multiple sclerosis.  He did have MRI evaluation of the brain, cervical spine, and thoracic spine done on 01 April 2017.  The disc for this was brought for my review.  The patient has multiple ovoid subcortical and periventricular white matter changes, there does not appear to be any significant involvement of the cervical or thoracic spinal cord.  The MRI findings are typical for multiple sclerosis.  The patient does have cerebrovascular disease, he has a known chronic occlusion of the left internal carotid artery, he has 65% stenosis of the right internal carotid artery, and some  disease affecting the vertebral arteries bilaterally.  He is followed through the [facility] hospital for this.    Past Medical History:  Diagnosis Date  . 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 cause of his ankle break. awaiting neuro workup  . Hypertension   . Hypothyroidism   . Multiple sclerosis (HCC) 1990  . Myocardial infarct (HCC)   . Optic neuritis    x 3. takes steriods and it resolves.  . Seizures (HCC)    as a child. diagnosed with MS and once treated, seizures resolved.  . Sleep apnea     Past Surgical History:  Procedure Laterality Date  . CORONARY ARTERY BYPASS GRAFT  2015  . CORONARY STENT INTERVENTION N/A 06/07/2017   Procedure: CORONARY STENT INTERVENTION;  Surgeon: Alwyn Pea, MD;  Location: [facility] INVASIVE CV LAB;  Service: Cardiovascular;  Laterality: N/A;  . JOINT REPLACEMENT Left 2007   left hip replacement  . LEFT HEART CATH AND CORS/GRAFTS ANGIOGRAPHY N/A 06/07/2017   Procedure: LEFT HEART CATH AND CORS/GRAFTS ANGIOGRAPHY;  Surgeon: Lamar Blinks, MD;  Location: [facility] INVASIVE CV LAB;  Service: Cardiovascular;  Laterality: N/A;  . ORIF ANKLE FRACTURE Right 09/22/2016   Procedure: OPEN REDUCTION INTERNAL FIXATION (ORIF) ANKLE FRACTURE;  Surgeon: Christena Flake, MD;  Location: [facility] ORS;  Service: Orthopedics;  Laterality: Right;  . quadruple cardiac bypass  2015    Family History  Problem Relation Age of Onset  . Cancer Mother   . Other Father     Social history:  reports that he has never smoked. He has quit using smokeless tobacco. His smokeless tobacco use included chew. He reports that he drinks alcohol. He reports that he does not use drugs.  Medications:  Prior to Admission medications   Medication Sig Start Date End Date Taking? Authorizing Provider  aspirin EC 81 MG tablet Take 81 mg by mouth daily.   Yes [provider]  atorvastatin (LIPITOR) 80 MG tablet Take 80 mg by mouth at bedtime.   Yes [provider]    buPROPion (WELLBUTRIN XL) 300 MG 24 hr tablet Take 300 mg by mouth daily.   Yes [provider]  clopidogrel (PLAVIX) 75 MG tablet Take 75 mg by mouth daily.   Yes [provider]  cyclobenzaprine (FLEXERIL) 10 MG tablet Take 1 tablet (10 mg total) by mouth 3 (three) times daily. 06/11/17  Yes Shaune Pollack, MD  Dimethyl Fumarate (TECFIDERA) 240 MG CPDR Take 240 mg by mouth 2 (two) times daily.   Yes [provider]  docusate sodium (COLACE) 50 MG capsule Take 50 mg by mouth 2 (two) times daily.   Yes [provider]  escitalopram (LEXAPRO) 20 MG tablet Take 20 mg by mouth daily.   Yes [provider]  folic acid (FOLVITE) 400 MCG tablet Take 400 mcg by mouth daily.   Yes [provider]  gabapentin (NEURONTIN) 300 MG capsule Take 900 mg by mouth 3 (three) times daily.    Yes [provider]  lamoTRIgine (LAMICTAL) 25 MG tablet Take 25 mg by mouth daily.   Yes [provider]  levothyroxine (SYNTHROID, LEVOTHROID) 25 MCG tablet Take 25 mcg by mouth daily before breakfast.    Yes [provider]  lisinopril (PRINIVIL,ZESTRIL) 20 MG tablet Take 20 mg by mouth daily.    Yes [provider]  montelukast (SINGULAIR) 10 MG tablet Take 10 mg by mouth daily.    Yes [provider]  niacin (NIASPAN) 500 MG CR tablet Take 500 mg by mouth at bedtime. 11/24/16 12/28/17 Yes [provider]  QUEtiapine (SEROQUEL) 100 MG tablet Take 100 mg by mouth at bedtime.   Yes [provider]  vitamin B-12 (CYANOCOBALAMIN) 1000 MCG tablet Take 1,000 mcg by mouth daily.  07/09/09  Yes [provider]      Allergies  Allergen Reactions  . Hydrochlorothiazide W-Triamterene Other (See Comments)    High blood pressure  . Metoprolol Other (See Comments)    Exacerbates MS, High blood pressure  . Penicillins Other (See Comments)    Sores in mouth Has patient had a PCN reaction causing immediate rash,  facial/tongue/throat swelling, SOB or lightheadedness with hypotension: No Has patient had a PCN reaction causing severe rash involving mucus membranes or skin necrosis: No Has patient had a PCN reaction that required hospitalization: No Has patient had a PCN reaction occurring within the last 10 years: No If all of the above answers are "NO", then may proceed with Cephalosporin use.     ROS:  Out of a complete 14 system review of symptoms, the patient complains only of the following symptoms, and all other reviewed systems are negative.  Weight gain, fatigue Easy bruising, easy bleeding Allergies Numbness, weakness Depression  Blood pressure (!) 181/100, pulse 65, height 6' (1.829 m), weight 229 lb 8 oz (104.1 kg).  Physical Exam  General: The patient is alert and cooperative at the time of the examination.  Eyes: Pupils are equal, round, and reactive to light. Discs are  flat bilaterally.  Optic atrophy is seen bilaterally.  Neck: The neck is supple, no carotid bruits are noted.  Respiratory: The respiratory examination is clear.  Cardiovascular: The cardiovascular examination reveals a regular rate and rhythm, no obvious murmurs or rubs are noted.  Skin: Extremities are without significant edema.  Neurologic Exam  Mental status: The patient is alert and oriented x 3 at the time of the examination. The patient has apparent normal recent and remote memory, with an apparently normal attention span and concentration ability.  Cranial nerves: Facial symmetry is not present.  There is some depression of the right nasolabial fold.  There is good sensation of the face to pinprick and soft touch bilaterally. The strength of the facial muscles and the muscles to head turning and shoulder shrug are normal bilaterally. Speech is well enunciated, no aphasia or dysarthria is noted. Extraocular movements are full. Visual fields are full. The tongue is midline, and the patient has symmetric  elevation of the soft palate. No obvious hearing deficits are noted.  Motor: The motor testing reveals 5 over 5 strength of all 4 extremities. Good symmetric motor tone is noted throughout.  Sensory: Sensory testing is intact to pinprick, soft touch, vibration sensation, and position sense on all 4 extremities. No evidence of extinction is noted.  Coordination: Cerebellar testing reveals good finger-nose-finger and heel-to-shin bilaterally.  Gait and station: Gait is minimally wide-based, the patient has a mild circumduction type gait with the left leg.  Reflexes: Deep tendon reflexes are symmetric, but are somewhat brisk bilaterally. Toes are downgoing on the right, upgoing on the left.    Assessment/Plan:  1.  Multiple sclerosis  2.  Cerebrovascular disease  The patient currently is on Tecfidera, he is having some gradual progression of the symptoms over time.  The patient may be developing a secondary progressive form of multiple sclerosis.  The use of Ocrevus may be indicated therefore.  The patient will have blood work done today, we will try to initiate treatment with Ocrevus in the near future.  He will stop the Tecfidera prior to initiation of the Ocrevus.  He will otherwise follow-up in 6 months, he will still be followed through the [facility].  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  Marlan Palau MD 12/28/2017 11:55 AM  [facility] [address] [place], [place] [address]  Phone [phone] Fax [phone]

## 2018-01-03 LAB — VARICELLA ZOSTER ANTIBODY, IGG: Varicella zoster IgG: 3767 index (ref >165)

## 2018-01-03 LAB — COMPREHENSIVE METABOLIC PANEL
ALBUMIN: 4.8 g/dL (ref 3.5–5.5)
ALT: 35 IU/L (ref 0–44)
AST: 30 IU/L (ref 0–40)
Albumin/Globulin Ratio: 2.1 (ref 1.2–2.2)
Alkaline Phosphatase: 75 IU/L (ref 39–117)
BUN / CREAT RATIO: 11 (ref 9–20)
BUN: 16 mg/dL (ref 6–24)
Bilirubin Total: 0.6 mg/dL (ref 0.0–1.2)
CO2: 26 mmol/L (ref 20–29)
CREATININE: 1.4 mg/dL — AB (ref 0.76–1.27)
Calcium: 10.6 mg/dL — ABNORMAL HIGH (ref 8.7–10.2)
Chloride: 100 mmol/L (ref 96–106)
GFR calc Af Amer: 66 mL/min/{1.73_m2} (ref >59)
GFR calc non Af Amer: 57 mL/min/{1.73_m2} — ABNORMAL LOW (ref >59)
GLOBULIN, TOTAL: 2.3 g/dL (ref 1.5–4.5)
GLUCOSE: 89 mg/dL (ref 65–99)
Potassium: 4.9 mmol/L (ref 3.5–5.2)
SODIUM: 139 mmol/L (ref 134–144)
Total Protein: 7.1 g/dL (ref 6.0–8.5)

## 2018-01-03 LAB — HEPATITIS B SURFACE ANTIBODY,QUALITATIVE: HEP B SURFACE AB, QUAL: NONREACTIVE

## 2018-01-03 LAB — CBC WITH DIFFERENTIAL/PLATELET
Basophils Absolute: 0 10*3/uL (ref 0.0–0.2)
Basos: 0 %
EOS (ABSOLUTE): 0.1 10*3/uL (ref 0.0–0.4)
EOS: 1 %
HEMATOCRIT: 39.7 % (ref 37.5–51.0)
HEMOGLOBIN: 13.1 g/dL (ref 13.0–17.7)
Immature Grans (Abs): 0 10*3/uL (ref 0.0–0.1)
Immature Granulocytes: 1 %
LYMPHS ABS: 1.6 10*3/uL (ref 0.7–3.1)
Lymphs: 25 %
MCH: 30.8 pg (ref 26.6–33.0)
MCHC: 33 g/dL (ref 31.5–35.7)
MCV: 93 fL (ref 79–97)
MONOCYTES: 7 %
Monocytes Absolute: 0.4 10*3/uL (ref 0.1–0.9)
NEUTROS ABS: 4.4 10*3/uL (ref 1.4–7.0)
Neutrophils: 66 %
Platelets: 259 10*3/uL (ref 150–450)
RBC: 4.25 x10E6/uL (ref 4.14–5.80)
RDW: 15.1 % (ref 12.3–15.4)
WBC: 6.6 10*3/uL (ref 3.4–10.8)

## 2018-01-03 LAB — HEPATITIS C ANTIBODY: Hep C Virus Ab: <0.1 s/co ratio (ref 0.0–0.9)

## 2018-01-03 LAB — QUANTIFERON-TB GOLD PLUS
QUANTIFERON TB1 AG VALUE: 0.14 [IU]/mL
QuantiFERON Mitogen Value: >10 IU/mL
QuantiFERON Nil Value: 0.12 IU/mL
QuantiFERON TB2 Ag Value: 0.12 IU/mL
QuantiFERON-TB Gold Plus: NEGATIVE

## 2018-01-03 LAB — HEPATITIS B CORE ANTIBODY, TOTAL: Hep B Core Total Ab: NEGATIVE

## 2018-01-04 ENCOUNTER — Telehealth: Payer: Self-pay

## 2018-01-04 NOTE — Telephone Encounter (Signed)
Cody Spaniel, MD  Rocky Link, CMA        Blood work shows mild chronic stable renal insufficiency, similar to what was seen 6 months ago. Calcium level is minimally elevated, of no clinical concern. Okay for start on Ocrevus.    I called patient and left a detailed message on voicemail, ok per dpr. I advised patient to call with any questions or concerns.

## 2018-01-04 NOTE — Telephone Encounter (Signed)
I have printed this message and given it to the infusion suite to make them aware that patient is good to start the Ocrevus.

## 2018-01-12 DIAGNOSIS — I6522 Occlusion and stenosis of left carotid artery: Secondary | ICD-10-CM | POA: Diagnosis not present

## 2018-01-12 DIAGNOSIS — I6521 Occlusion and stenosis of right carotid artery: Secondary | ICD-10-CM | POA: Diagnosis not present

## 2018-01-13 ENCOUNTER — Telehealth: Payer: Self-pay | Admitting: *Deleted

## 2018-01-13 NOTE — Telephone Encounter (Signed)
Tim called back. He states Melton Krebs helps with pt coordination. She can help with situation. If they are unavailable, can contact Tim anytime by email: [email or web address]. He is going to send a new authorization valid until 02/21/2018. He will email me an updated copy of authorization. He is going to Psychiatrist Dr. Dewain Penning (community care director) to see how to get this service approved for pt.   He mentioned we would have to be in network with [employer] after auth runs out on 02/21/18 via [place]. He was unable to tell me if we are.

## 2018-01-13 NOTE — Telephone Encounter (Signed)
I called pt. Advised we have been in contact with the [facility] to try and get approval. We will call him with any updates. He verbalized understanding and appreciation for call.

## 2018-01-13 NOTE — Telephone Encounter (Signed)
I called [facility] at [phone] and spoke with Belinda Block. Explained we are trying to initiate treatment with Ocrevus for pt MS. Pt informed us we would have to get approval through them to be able to start this therapy. Tim also got on phone call (employee there). He is going to call me back to discuss further. He states he will have nurse/PA review OV note from 12/28/17 and may have to submit new consult for this.

## 2018-01-13 NOTE — Telephone Encounter (Signed)
I will give this information to Inetta Fermo in intrafusion and give them any updates we may receive from [place].

## 2018-01-14 NOTE — Telephone Encounter (Signed)
Tim/[facility] [phone] called said ocrevus has been approved. He is sending Tax inspector (he got her email address from [place] yesterday). If you need to speak with him please call

## 2018-01-14 NOTE — Telephone Encounter (Signed)
I received Tim's email but am unable to open it because the [place] encryption is not compatible with [facility]'s email. I called Tim, left him a message asking him to fax [place] the information regarding this authorization, such as how long the approval is valid and where the pt can have the ocrevus infusion. I asked Tim to call us back with further questions/concerns.

## 2018-01-14 NOTE — Telephone Encounter (Signed)
Inetta Fermo, RN is aware of this situation.

## 2018-01-17 NOTE — Telephone Encounter (Signed)
Received documentation from the [place] regarding pt's ocrevus infusion. Will give a copy to [place], Charity fundraiser in infusions and Emma,RN.

## 2018-02-18 ENCOUNTER — Other Ambulatory Visit: Payer: Self-pay | Admitting: Neurology

## 2018-02-21 ENCOUNTER — Telehealth: Payer: Self-pay | Admitting: *Deleted

## 2018-02-21 NOTE — Telephone Encounter (Signed)
Faxed back signed rx refill for Ocrevus to [facility] by Dr. Anne Hahn. He provided: 300mg  IV once and then repeat in 14 days once, 1 refill. After that, 600mg  IV q1months w/ 2 refills. Fax: [phone]. Received fax confirmation.

## 2018-03-02 DIAGNOSIS — G35 Multiple sclerosis: Secondary | ICD-10-CM | POA: Diagnosis not present

## 2018-03-16 DIAGNOSIS — G35 Multiple sclerosis: Secondary | ICD-10-CM | POA: Diagnosis not present

## 2018-03-22 ENCOUNTER — Telehealth: Payer: Self-pay | Admitting: Neurology

## 2018-03-22 NOTE — Telephone Encounter (Signed)
Spoke with pt.  He has had both parts A and B of first Ocrevus infusion, with last infusion being yesterday. On Sunday night, he noticed several yellowish spots that resemble bruises--one on each arm, one on his back. Other than these spots, he sts skin does not look yellow. He denies abd./back pain. Sts. since first noticing spots on Sunday, they seem to be getting better. Does not believe spots on his arms are where IV's were started. He will continue to monitor areas and call back if they do not resolve or if they get worse or he develops other sx.  Will let Dr. Anne Hahn know of this conversation/fim

## 2018-03-22 NOTE — Telephone Encounter (Signed)
Patient had an infusion of Ocrevus last week and yesterday he noticed his arms have a yellowish rash or bruising. Please call and discuss.

## 2018-03-22 NOTE — Telephone Encounter (Signed)
Events noted, agree with nursing asessment.

## 2018-05-25 ENCOUNTER — Other Ambulatory Visit: Payer: Self-pay

## 2018-05-25 ENCOUNTER — Encounter: Payer: Self-pay | Admitting: Emergency Medicine

## 2018-05-25 ENCOUNTER — Ambulatory Visit
Admission: EM | Admit: 2018-05-25 | Discharge: 2018-05-25 | Disposition: A | Discharge status: Home / Self Care | Payer: Medicare Other | Attending: Family Medicine | Admitting: Family Medicine

## 2018-05-25 DIAGNOSIS — W01190A Fall on same level from slipping, tripping and stumbling with subsequent striking against furniture, initial encounter: Secondary | ICD-10-CM | POA: Diagnosis not present

## 2018-05-25 DIAGNOSIS — S8992XA Unspecified injury of left lower leg, initial encounter: Secondary | ICD-10-CM | POA: Insufficient documentation

## 2018-05-25 NOTE — ED Triage Notes (Signed)
Patient states a dog jumped on him last night causing him to fall back and he hit the back of his left thigh on a steel table. Patient has a cut to the back of his left leg.

## 2018-05-25 NOTE — ED Provider Notes (Signed)
[facility]    CSN: [id number] Arrival date & time: 05/25/18  1342  History   Chief Complaint Chief Complaint  Patient presents with  . Fall   HPI  53 year old male presents for evaluation after injuring his left leg last night.  Patient states that a dog jumped on him last night causing him to fall backwards and hit the back of his thigh on a table.  Patient has a superficial wound and diffuse bruising on the back of his left thigh.  Mild pain.  Patient is concerned that he may need stitches.  No fever.  No chills.  No other associated symptoms.  No other complaints.  PMH, Surgical Hx, Family Hx, Social History reviewed and updated as below.  Past Medical History:  Diagnosis Date  . 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 cause of his ankle break. awaiting neuro workup  . Hypertension   . Hypothyroidism   . Multiple sclerosis (HCC) 1990  . Myocardial infarct (HCC)   . Optic neuritis    x 3. takes steriods and it resolves.  . Seizures (HCC)    as a child. diagnosed with MS and once treated, seizures resolved.  . Sleep apnea     Patient Active Problem List   Diagnosis Date Noted  . Acute renal failure (ARF) (HCC) 06/05/2017  . Left hip pain 04/05/2017  . Acute kidney injury (HCC) 02/23/2017  . Zoster 02/05/2017  . Syncope 02/03/2017  . Stenosis of carotid artery 01/05/2017  . Carotid stenosis, asymptomatic, right 11/16/2016  . 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 Hx of ischemic left MCA stroke 11/13/2016  . Hypertension, essential 11/13/2016  . Multiple sclerosis, relapsing-remitting (HCC) 11/13/2016  . Carotid occlusion, left 11/13/2016  . Hyperlipidemia LDL goal <70 11/13/2016  . Slurred speech 11/13/2016  . Closed trimalleolar fracture of right ankle 09/25/2016  . Closed right ankle fracture 09/22/2016    Past Surgical History:  Procedure Laterality Date  . CORONARY ARTERY BYPASS  GRAFT  2015  . CORONARY STENT INTERVENTION N/A 06/07/2017   Procedure: CORONARY STENT INTERVENTION;  Surgeon: Alwyn Pea, MD;  Location: [facility] INVASIVE CV LAB;  Service: Cardiovascular;  Laterality: N/A;  . JOINT REPLACEMENT Left 2007   left hip replacement  . LEFT HEART CATH AND CORS/GRAFTS ANGIOGRAPHY N/A 06/07/2017   Procedure: LEFT HEART CATH AND CORS/GRAFTS ANGIOGRAPHY;  Surgeon: Lamar Blinks, MD;  Location: [facility] INVASIVE CV LAB;  Service: Cardiovascular;  Laterality: N/A;  . ORIF ANKLE FRACTURE Right 09/22/2016   Procedure: OPEN REDUCTION INTERNAL FIXATION (ORIF) ANKLE FRACTURE;  Surgeon: Christena Flake, MD;  Location: [facility] ORS;  Service: Orthopedics;  Laterality: Right;  . quadruple cardiac bypass  2015       Home Medications    Prior to Admission medications   Medication Sig Start Date End Date Taking? Authorizing Provider  aspirin EC 81 MG tablet Take 81 mg by mouth daily.   Yes [provider]  atorvastatin (LIPITOR) 80 MG tablet Take 80 mg by mouth at bedtime.   Yes [provider]  buPROPion (WELLBUTRIN XL) 300 MG 24 hr tablet Take 300 mg by mouth daily.   Yes [provider]  clopidogrel (PLAVIX) 75 MG tablet Take 75 mg by mouth daily.   Yes [provider]  cyclobenzaprine (FLEXERIL) 10 MG tablet Take 1 tablet (10 mg total) by mouth 3 (three) times daily. 06/11/17  Yes Shaune Pollack, MD  docusate sodium (  COLACE) 50 MG capsule Take 50 mg by mouth 2 (two) times daily.   Yes [provider]  escitalopram (LEXAPRO) 20 MG tablet Take 20 mg by mouth daily.   Yes [provider]  folic acid (FOLVITE) 400 MCG tablet Take 400 mcg by mouth daily.   Yes [provider]  gabapentin (NEURONTIN) 300 MG capsule Take 900 mg by mouth 3 (three) times daily.    Yes [provider]  lamoTRIgine (LAMICTAL) 25 MG tablet Take 25 mg by mouth daily.   Yes [provider]  levothyroxine (SYNTHROID, LEVOTHROID) 25 MCG  tablet Take 25 mcg by mouth daily before breakfast.    Yes [provider]  lisinopril (PRINIVIL,ZESTRIL) 20 MG tablet Take 20 mg by mouth daily.    Yes [provider]  montelukast (SINGULAIR) 10 MG tablet Take 10 mg by mouth daily.    Yes [provider]  QUEtiapine (SEROQUEL) 100 MG tablet Take 100 mg by mouth at bedtime.   Yes [provider]  vitamin B-12 (CYANOCOBALAMIN) 1000 MCG tablet Take 1,000 mcg by mouth daily.  07/09/09  Yes [provider]  niacin (NIASPAN) 500 MG CR tablet Take 500 mg by mouth at bedtime. 11/24/16 12/28/17  [provider]    Family History Family History  Problem Relation Age of Onset  . Cancer Mother   . Other Father     Social History Social History   Tobacco Use  . Smoking status: Never Smoker  . Smokeless tobacco: Former Neurosurgeon    Types: Chew  . Tobacco comment: rarely uses smokeless tobacco  Substance Use Topics  . Alcohol use: Yes    Comment: maybe a 6 pack per week  . Drug use: No     Allergies   Hydrochlorothiazide w-triamterene; Metoprolol; and Penicillins   Review of Systems Review of Systems  Constitutional: Negative.   Musculoskeletal:       Left leg injury.  Skin: Positive for wound.   Physical Exam Triage Vital Signs ED Triage Vitals  Enc Vitals Group     BP 05/25/18 1451 (!) 175/112     Pulse Rate 05/25/18 1451 100     Resp 05/25/18 1451 18     Temp 05/25/18 1451 97.6 F (36.4 C)     Temp Source 05/25/18 1451 Oral     SpO2 05/25/18 1451 99 %     Weight 05/25/18 1450 230 lb (104.3 kg)     Height 05/25/18 1450 6' (1.829 m)     Head Circumference --      Peak Flow --      Pain Score 05/25/18 1450 0     Pain Loc --      Pain Edu? --      Excl. in GC? --    Updated Vital Signs BP (!) 175/112 (BP Location: Right Arm)   Pulse 100   Temp 97.6 F (36.4 C) (Oral)   Resp 18   Ht 6' (1.829 m)   Wt 104.3 kg   SpO2 99%   BMI 31.19 kg/m   Visual Acuity Right Eye  Distance:   Left Eye Distance:   Bilateral Distance:    Right Eye Near:   Left Eye Near:    Bilateral Near:     Physical Exam Vitals signs and nursing note reviewed.  Constitutional:      General: He is not in acute distress. HENT:     Head: Normocephalic and atraumatic.  Eyes:  General: No scleral icterus.    Conjunctiva/sclera: Conjunctivae normal.  Pulmonary:     Effort: Pulmonary effort is normal. No respiratory distress.  Skin:    Comments: Posterior left thigh -patient with a long, linear superficial abrasion with surrounding bruising.  Neurological:     General: No focal deficit present.     Mental Status: He is alert and oriented to person, place, and time.  Psychiatric:        Mood and Affect: Mood normal.        Behavior: Behavior normal.    UC Treatments / Results  Labs (all labs ordered are listed, but only abnormal results are displayed) Labs Reviewed - No data to display  EKG None  Radiology No results found.  Procedures Procedures (including critical care time)  Medications Ordered in UC Medications - No data to display  Initial Impression / Assessment and Plan / UC Course  I have reviewed the triage vital signs and the nursing notes.  Pertinent labs & imaging results that were available during my care of the patient were reviewed by me and considered in my medical decision making (see chart for details).    53 year old male presents for left leg injury.  Superficial.  No need for suturing.  Diffuse bruising.  Advised over-the-counter Tylenol and ibuprofen as needed for pain.  Supportive care.  Final Clinical Impressions(s) / UC Diagnoses   Final diagnoses:  Injury of left lower extremity, initial encounter     Discharge Instructions     OTC Tylenol/motrin as needed.  Take care  Dr. Adriana Simas    ED Prescriptions    None     Controlled Substance Prescriptions  Controlled Substance Registry consulted? Not Applicable   Tommie Sams, DO 05/25/18 1546

## 2018-05-25 NOTE — Discharge Instructions (Signed)
OTC Tylenol/motrin as needed.  Take care  Dr. Adriana Simas

## 2018-07-04 ENCOUNTER — Ambulatory Visit: Payer: Medicare Other | Admitting: Neurology

## 2018-07-04 ENCOUNTER — Telehealth: Payer: Self-pay | Admitting: Neurology

## 2018-07-04 NOTE — Telephone Encounter (Signed)
This patient did not show for a revisit appointment today. 

## 2018-07-05 ENCOUNTER — Encounter: Payer: Self-pay | Admitting: Neurology

## 2018-07-14 DIAGNOSIS — Z09 Encounter for follow-up examination after completed treatment for conditions other than malignant neoplasm: Secondary | ICD-10-CM | POA: Diagnosis not present

## 2018-07-14 DIAGNOSIS — G35 Multiple sclerosis: Secondary | ICD-10-CM | POA: Diagnosis not present

## 2018-07-14 DIAGNOSIS — I6522 Occlusion and stenosis of left carotid artery: Secondary | ICD-10-CM | POA: Diagnosis not present

## 2018-07-21 NOTE — Progress Notes (Deleted)
PATIENT: Cody Burns DOB: 01/13/1966  REASON FOR VISIT: follow up HISTORY FROM: patient  HISTORY OF PRESENT ILLNESS: Today 07/27/18  Cody Burns is a 53 year old male with history of multiple sclerosis on Ocrevus treatment.  First infusion was in October 2019.  After his first treatment he noticed several yellowish spots one on each arm and one on his back.  HISTORY  Cody Burns is a 53 year old right-handed white male with a history of multiple sclerosis that was initially diagnosed in 54.  The patient indicated that the presentation of the multiple sclerosis was associated with numbness and weakness on the left side of the body and a left-sided optic neuritis.  Over the years, the patient has eventually developed bilateral optic neuritis.  He initially was treated with Betaseron, then he switched to Copaxone, eventually went on Rebif and then Avonex.  Most recently, he has been on Tecfidera, he has been taking this medication for 2 years.  He has had progression of his symptoms on Tecfidera, his symptoms are now progressing slowly, and are not associated with individual relapses.  He has had some change in his speech over the last 1 year, he has had increasing problems with spasticity of the legs, and difficulty with walking.  He has had episodes of urinary incontinence.  He does fall on occasion, he has fallen 4 times in the last year.  He reports a mild short-term memory problem.  He is followed through the [facility] hospital in [place], [place].  He is referred to this practice for further evaluation, his neurologist has suggested Ocrevus for his multiple sclerosis.  He did have MRI evaluation of the brain, cervical spine, and thoracic spine done on 01 April 2017.  The disc for this was brought for my review.  The patient has multiple ovoid subcortical and periventricular white matter changes, there does not appear to be any significant involvement of the cervical or thoracic spinal cord.   The MRI findings are typical for multiple sclerosis.  The patient does have cerebrovascular disease, he has a known chronic occlusion of the left internal carotid artery, he has 65% stenosis of the right internal carotid artery, and some disease affecting the vertebral arteries bilaterally.  He is followed through the [facility] hospital for this.   REVIEW OF SYSTEMS: Out of a complete 14 system review of symptoms, the patient complains only of the following symptoms, and all other reviewed systems are negative.  ALLERGIES: Allergies  Allergen Reactions  . Hydrochlorothiazide W-Triamterene Other (See Comments)    High blood pressure  . Metoprolol Other (See Comments)    Exacerbates MS, High blood pressure  . Penicillins Other (See Comments)    Sores in mouth Has patient had a PCN reaction causing immediate rash, facial/tongue/throat swelling, SOB or lightheadedness with hypotension: No Has patient had a PCN reaction causing severe rash involving mucus membranes or skin necrosis: No Has patient had a PCN reaction that required hospitalization: No Has patient had a PCN reaction occurring within the last 10 years: No If all of the above answers are "NO", then may proceed with Cephalosporin use.     HOME MEDICATIONS: Outpatient Medications Prior to Visit  Medication Sig Dispense Refill  . aspirin EC 81 MG tablet Take 81 mg by mouth daily.    Marland Kitchen atorvastatin (LIPITOR) 80 MG tablet Take 80 mg by mouth at bedtime.    Marland Kitchen buPROPion (WELLBUTRIN XL) 300 MG 24 hr tablet Take 300 mg by mouth daily.    Marland Kitchen  clopidogrel (PLAVIX) 75 MG tablet Take 75 mg by mouth daily.    . cyclobenzaprine (FLEXERIL) 10 MG tablet Take 1 tablet (10 mg total) by mouth 3 (three) times daily. 30 tablet 0  . docusate sodium (COLACE) 50 MG capsule Take 50 mg by mouth 2 (two) times daily.    Marland Kitchen escitalopram (LEXAPRO) 20 MG tablet Take 20 mg by mouth daily.    . folic acid (FOLVITE) 400 MCG tablet Take 400 mcg by mouth daily.    Marland Kitchen  gabapentin (NEURONTIN) 300 MG capsule Take 900 mg by mouth 3 (three) times daily.     Marland Kitchen lamoTRIgine (LAMICTAL) 25 MG tablet Take 25 mg by mouth daily.    Marland Kitchen levothyroxine (SYNTHROID, LEVOTHROID) 25 MCG tablet Take 25 mcg by mouth daily before breakfast.     . lisinopril (PRINIVIL,ZESTRIL) 20 MG tablet Take 20 mg by mouth daily.     . montelukast (SINGULAIR) 10 MG tablet Take 10 mg by mouth daily.     . niacin (NIASPAN) 500 MG CR tablet Take 500 mg by mouth at bedtime.    Marland Kitchen QUEtiapine (SEROQUEL) 100 MG tablet Take 100 mg by mouth at bedtime.    . vitamin B-12 (CYANOCOBALAMIN) 1000 MCG tablet Take 1,000 mcg by mouth daily.      No facility-administered medications prior to visit.     PAST MEDICAL HISTORY: Past Medical History:  Diagnosis Date  . 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 cause of his ankle break. awaiting neuro workup  . Hypertension   . Hypothyroidism   . Multiple sclerosis (HCC) 1990  . Myocardial infarct (HCC)   . Optic neuritis    x 3. takes steriods and it resolves.  . Seizures (HCC)    as a child. diagnosed with MS and once treated, seizures resolved.  . Sleep apnea     PAST SURGICAL HISTORY: Past Surgical History:  Procedure Laterality Date  . CORONARY ARTERY BYPASS GRAFT  2015  . CORONARY STENT INTERVENTION N/A 06/07/2017   Procedure: CORONARY STENT INTERVENTION;  Surgeon: Alwyn Pea, MD;  Location: [facility] INVASIVE CV LAB;  Service: Cardiovascular;  Laterality: N/A;  . JOINT REPLACEMENT Left 2007   left hip replacement  . LEFT HEART CATH AND CORS/GRAFTS ANGIOGRAPHY N/A 06/07/2017   Procedure: LEFT HEART CATH AND CORS/GRAFTS ANGIOGRAPHY;  Surgeon: Lamar Blinks, MD;  Location: [facility] INVASIVE CV LAB;  Service: Cardiovascular;  Laterality: N/A;  . ORIF ANKLE FRACTURE Right 09/22/2016   Procedure: OPEN REDUCTION INTERNAL FIXATION (ORIF) ANKLE FRACTURE;  Surgeon: Christena Flake, MD;  Location: [facility] ORS;  Service: Orthopedics;  Laterality: Right;   . quadruple cardiac bypass  2015    FAMILY HISTORY: Family History  Problem Relation Age of Onset  . Cancer Mother   . Other Father     SOCIAL HISTORY: Social History   Socioeconomic History  . Marital status: Divorced    Spouse name: Not on file  . Number of children: 1  . Years of education: 4  . Highest education level: Not on file  Occupational History  . Occupation: Disabled  Social Needs  . Financial resource strain: Not on file  . Food insecurity:    Worry: Not on file    Inability: Not on file  . Transportation needs:    Medical: Not on file    Non-medical: Not on file  Tobacco Use  . Smoking status: Never Smoker  . Smokeless tobacco: Former Neurosurgeon    Types: Chew  .  Tobacco comment: rarely uses smokeless tobacco  Substance and Sexual Activity  . Alcohol use: Yes    Comment: maybe a 6 pack per week  . Drug use: No  . Sexual activity: Not on file  Lifestyle  . Physical activity:    Days per week: Not on file    Minutes per session: Not on file  . Stress: Not on file  Relationships  . Social connections:    Talks on phone: Not on file    Gets together: Not on file    Attends religious service: Not on file    Active member of club or organization: Not on file    Attends meetings of clubs or organizations: Not on file    Relationship status: Not on file  . Intimate partner violence:    Fear of current or ex partner: Not on file    Emotionally abused: Not on file    Physically abused: Not on file    Forced sexual activity: Not on file  Other Topics Concern  . Not on file  Social History Narrative   Lives alone   Caffeine use: Soda daily   Right handed      PHYSICAL EXAM  There were no vitals filed for this visit. There is no height or weight on file to calculate BMI.  Generalized: Well developed, in no acute distress   Neurological examination  Mentation: Alert oriented to time, place, history taking. Follows all commands speech and  language fluent Cranial nerve II-XII: Pupils were equal round reactive to light. Extraocular movements were full, visual field were full on confrontational test. Facial sensation and strength were normal. Uvula tongue midline. Head turning and shoulder shrug  were normal and symmetric. Motor: The motor testing reveals 5 over 5 strength of all 4 extremities. Good symmetric motor tone is noted throughout.  Sensory: Sensory testing is intact to soft touch on all 4 extremities. No evidence of extinction is noted.  Coordination: Cerebellar testing reveals good finger-nose-finger and heel-to-shin bilaterally.  Gait and station: Gait is normal. Tandem gait is normal. Romberg is negative. No drift is seen.  Reflexes: Deep tendon reflexes are symmetric and normal bilaterally.   DIAGNOSTIC DATA (LABS, IMAGING, TESTING) - I reviewed patient records, labs, notes, testing and imaging myself where available.  Lab Results  Component Value Date   WBC 6.6 12/28/2017   HGB 13.1 12/28/2017   HCT 39.7 12/28/2017   MCV 93 12/28/2017   PLT 259 12/28/2017      Component Value Date/Time   NA 139 12/28/2017 1156   K 4.9 12/28/2017 1156   CL 100 12/28/2017 1156   CO2 26 12/28/2017 1156   GLUCOSE 89 12/28/2017 1156   GLUCOSE 103 (H) 06/11/2017 0529   BUN 16 12/28/2017 1156   CREATININE 1.40 (H) 12/28/2017 1156   CALCIUM 10.6 (H) 12/28/2017 1156   PROT 7.1 12/28/2017 1156   ALBUMIN 4.8 12/28/2017 1156   AST 30 12/28/2017 1156   ALT 35 12/28/2017 1156   ALKPHOS 75 12/28/2017 1156   BILITOT 0.6 12/28/2017 1156   GFRNONAA 57 (L) 12/28/2017 1156   GFRAA 66 12/28/2017 1156   Lab Results  Component Value Date   CHOL 133 06/07/2017   HDL 54 06/07/2017   LDLCALC 55 06/07/2017   TRIG 119 06/07/2017   CHOLHDL 2.5 06/07/2017   No results found for: HGBA1C Lab Results  Component Value Date   VITAMINB12 903 06/08/2017   Lab Results  Component Value Date  TSH 1.215 06/05/2017      ASSESSMENT AND  PLAN 53 y.o. year old male  has a past medical history of Blackout (2018), Hypertension, Hypothyroidism, Multiple sclerosis (HCC) (1990), Myocardial infarct (HCC), Optic neuritis, Seizures (HCC), and Sleep apnea. here with ***   I spent 15 minutes with the patient. 50% of this time was spent   Margie Ege, [place], DNP 07/27/2018, 3:49 PM [facility] [address], [address] [place], [place] [address] [phone]

## 2018-07-27 DIAGNOSIS — I10 Essential (primary) hypertension: Secondary | ICD-10-CM | POA: Diagnosis not present

## 2018-07-27 DIAGNOSIS — M6281 Muscle weakness (generalized): Secondary | ICD-10-CM | POA: Diagnosis not present

## 2018-07-27 DIAGNOSIS — R41841 Cognitive communication deficit: Secondary | ICD-10-CM | POA: Diagnosis not present

## 2018-07-27 DIAGNOSIS — F3289 Other specified depressive episodes: Secondary | ICD-10-CM | POA: Diagnosis not present

## 2018-07-27 DIAGNOSIS — K5909 Other constipation: Secondary | ICD-10-CM | POA: Diagnosis not present

## 2018-07-27 DIAGNOSIS — G35 Multiple sclerosis: Secondary | ICD-10-CM | POA: Diagnosis not present

## 2018-07-27 DIAGNOSIS — R52 Pain, unspecified: Secondary | ICD-10-CM | POA: Diagnosis not present

## 2018-07-27 DIAGNOSIS — Z96642 Presence of left artificial hip joint: Secondary | ICD-10-CM | POA: Diagnosis not present

## 2018-07-27 DIAGNOSIS — F0151 Vascular dementia with behavioral disturbance: Secondary | ICD-10-CM | POA: Diagnosis not present

## 2018-07-27 DIAGNOSIS — F418 Other specified anxiety disorders: Secondary | ICD-10-CM | POA: Diagnosis not present

## 2018-07-27 DIAGNOSIS — M62838 Other muscle spasm: Secondary | ICD-10-CM | POA: Diagnosis not present

## 2018-07-27 DIAGNOSIS — I2510 Atherosclerotic heart disease of native coronary artery without angina pectoris: Secondary | ICD-10-CM | POA: Diagnosis not present

## 2018-07-27 DIAGNOSIS — G4709 Other insomnia: Secondary | ICD-10-CM | POA: Diagnosis not present

## 2018-07-27 DIAGNOSIS — E569 Vitamin deficiency, unspecified: Secondary | ICD-10-CM | POA: Diagnosis not present

## 2018-07-27 DIAGNOSIS — Z471 Aftercare following joint replacement surgery: Secondary | ICD-10-CM | POA: Diagnosis not present

## 2018-07-27 DIAGNOSIS — Z8673 Personal history of transient ischemic attack (TIA), and cerebral infarction without residual deficits: Secondary | ICD-10-CM | POA: Diagnosis not present

## 2018-07-27 DIAGNOSIS — K5900 Constipation, unspecified: Secondary | ICD-10-CM | POA: Diagnosis not present

## 2018-07-27 DIAGNOSIS — E039 Hypothyroidism, unspecified: Secondary | ICD-10-CM | POA: Diagnosis not present

## 2018-07-27 DIAGNOSIS — T84091D Other mechanical complication of internal left hip prosthesis, subsequent encounter: Secondary | ICD-10-CM | POA: Diagnosis not present

## 2018-07-27 DIAGNOSIS — J3089 Other allergic rhinitis: Secondary | ICD-10-CM | POA: Diagnosis not present

## 2018-07-27 DIAGNOSIS — E785 Hyperlipidemia, unspecified: Secondary | ICD-10-CM | POA: Diagnosis not present

## 2018-07-27 DIAGNOSIS — M25552 Pain in left hip: Secondary | ICD-10-CM | POA: Diagnosis not present

## 2018-07-28 ENCOUNTER — Telehealth: Payer: Self-pay

## 2018-07-28 ENCOUNTER — Ambulatory Visit: Payer: Medicare Other | Admitting: Neurology

## 2018-07-28 DIAGNOSIS — I2510 Atherosclerotic heart disease of native coronary artery without angina pectoris: Secondary | ICD-10-CM | POA: Diagnosis not present

## 2018-07-28 DIAGNOSIS — I10 Essential (primary) hypertension: Secondary | ICD-10-CM | POA: Diagnosis not present

## 2018-07-28 DIAGNOSIS — Z96642 Presence of left artificial hip joint: Secondary | ICD-10-CM | POA: Diagnosis not present

## 2018-07-28 DIAGNOSIS — Z8673 Personal history of transient ischemic attack (TIA), and cerebral infarction without residual deficits: Secondary | ICD-10-CM | POA: Diagnosis not present

## 2018-07-28 DIAGNOSIS — K5900 Constipation, unspecified: Secondary | ICD-10-CM | POA: Diagnosis not present

## 2018-07-28 DIAGNOSIS — F418 Other specified anxiety disorders: Secondary | ICD-10-CM | POA: Diagnosis not present

## 2018-07-28 DIAGNOSIS — G35 Multiple sclerosis: Secondary | ICD-10-CM | POA: Diagnosis not present

## 2018-07-28 DIAGNOSIS — E785 Hyperlipidemia, unspecified: Secondary | ICD-10-CM | POA: Diagnosis not present

## 2018-07-28 DIAGNOSIS — M25552 Pain in left hip: Secondary | ICD-10-CM | POA: Diagnosis not present

## 2018-07-28 DIAGNOSIS — E039 Hypothyroidism, unspecified: Secondary | ICD-10-CM | POA: Diagnosis not present

## 2018-07-28 NOTE — Telephone Encounter (Signed)
Patient was a no call/no show for their appointment today.   

## 2018-07-29 ENCOUNTER — Encounter: Payer: Self-pay | Admitting: Neurology

## 2018-08-08 DIAGNOSIS — Z96642 Presence of left artificial hip joint: Secondary | ICD-10-CM | POA: Diagnosis not present

## 2018-08-08 DIAGNOSIS — Z8673 Personal history of transient ischemic attack (TIA), and cerebral infarction without residual deficits: Secondary | ICD-10-CM | POA: Diagnosis not present

## 2018-08-08 DIAGNOSIS — I2510 Atherosclerotic heart disease of native coronary artery without angina pectoris: Secondary | ICD-10-CM | POA: Diagnosis not present

## 2018-08-08 DIAGNOSIS — E039 Hypothyroidism, unspecified: Secondary | ICD-10-CM | POA: Diagnosis not present

## 2018-08-08 DIAGNOSIS — E785 Hyperlipidemia, unspecified: Secondary | ICD-10-CM | POA: Diagnosis not present

## 2018-08-08 DIAGNOSIS — G35 Multiple sclerosis: Secondary | ICD-10-CM | POA: Diagnosis not present

## 2018-08-08 DIAGNOSIS — M25552 Pain in left hip: Secondary | ICD-10-CM | POA: Diagnosis not present

## 2018-08-08 DIAGNOSIS — F418 Other specified anxiety disorders: Secondary | ICD-10-CM | POA: Diagnosis not present

## 2018-08-08 DIAGNOSIS — K5900 Constipation, unspecified: Secondary | ICD-10-CM | POA: Diagnosis not present

## 2018-08-14 DIAGNOSIS — Z7901 Long term (current) use of anticoagulants: Secondary | ICD-10-CM | POA: Diagnosis not present

## 2018-08-14 DIAGNOSIS — F329 Major depressive disorder, single episode, unspecified: Secondary | ICD-10-CM | POA: Diagnosis not present

## 2018-08-14 DIAGNOSIS — G35 Multiple sclerosis: Secondary | ICD-10-CM | POA: Diagnosis not present

## 2018-08-14 DIAGNOSIS — E785 Hyperlipidemia, unspecified: Secondary | ICD-10-CM | POA: Diagnosis not present

## 2018-08-14 DIAGNOSIS — Z7982 Long term (current) use of aspirin: Secondary | ICD-10-CM | POA: Diagnosis not present

## 2018-08-14 DIAGNOSIS — E039 Hypothyroidism, unspecified: Secondary | ICD-10-CM | POA: Diagnosis not present

## 2018-08-14 DIAGNOSIS — T84091D Other mechanical complication of internal left hip prosthesis, subsequent encounter: Secondary | ICD-10-CM | POA: Diagnosis not present

## 2018-08-14 DIAGNOSIS — Z9181 History of falling: Secondary | ICD-10-CM | POA: Diagnosis not present

## 2018-08-14 DIAGNOSIS — I10 Essential (primary) hypertension: Secondary | ICD-10-CM | POA: Diagnosis not present

## 2018-08-14 DIAGNOSIS — F0151 Vascular dementia with behavioral disturbance: Secondary | ICD-10-CM | POA: Diagnosis not present

## 2018-08-14 DIAGNOSIS — Z79891 Long term (current) use of opiate analgesic: Secondary | ICD-10-CM | POA: Diagnosis not present

## 2018-08-15 DIAGNOSIS — F329 Major depressive disorder, single episode, unspecified: Secondary | ICD-10-CM | POA: Diagnosis not present

## 2018-08-15 DIAGNOSIS — T84091D Other mechanical complication of internal left hip prosthesis, subsequent encounter: Secondary | ICD-10-CM | POA: Diagnosis not present

## 2018-08-15 DIAGNOSIS — F0151 Vascular dementia with behavioral disturbance: Secondary | ICD-10-CM | POA: Diagnosis not present

## 2018-08-15 DIAGNOSIS — E039 Hypothyroidism, unspecified: Secondary | ICD-10-CM | POA: Diagnosis not present

## 2018-08-15 DIAGNOSIS — G35 Multiple sclerosis: Secondary | ICD-10-CM | POA: Diagnosis not present

## 2018-08-15 DIAGNOSIS — I10 Essential (primary) hypertension: Secondary | ICD-10-CM | POA: Diagnosis not present

## 2018-08-16 DIAGNOSIS — T84091D Other mechanical complication of internal left hip prosthesis, subsequent encounter: Secondary | ICD-10-CM | POA: Diagnosis not present

## 2018-08-16 DIAGNOSIS — F329 Major depressive disorder, single episode, unspecified: Secondary | ICD-10-CM | POA: Diagnosis not present

## 2018-08-16 DIAGNOSIS — I10 Essential (primary) hypertension: Secondary | ICD-10-CM | POA: Diagnosis not present

## 2018-08-16 DIAGNOSIS — F0151 Vascular dementia with behavioral disturbance: Secondary | ICD-10-CM | POA: Diagnosis not present

## 2018-08-16 DIAGNOSIS — E039 Hypothyroidism, unspecified: Secondary | ICD-10-CM | POA: Diagnosis not present

## 2018-08-16 DIAGNOSIS — G35 Multiple sclerosis: Secondary | ICD-10-CM | POA: Diagnosis not present

## 2018-08-18 DIAGNOSIS — T84091D Other mechanical complication of internal left hip prosthesis, subsequent encounter: Secondary | ICD-10-CM | POA: Diagnosis not present

## 2018-08-18 DIAGNOSIS — I10 Essential (primary) hypertension: Secondary | ICD-10-CM | POA: Diagnosis not present

## 2018-08-18 DIAGNOSIS — G35 Multiple sclerosis: Secondary | ICD-10-CM | POA: Diagnosis not present

## 2018-08-18 DIAGNOSIS — F0151 Vascular dementia with behavioral disturbance: Secondary | ICD-10-CM | POA: Diagnosis not present

## 2018-08-18 DIAGNOSIS — E039 Hypothyroidism, unspecified: Secondary | ICD-10-CM | POA: Diagnosis not present

## 2018-08-18 DIAGNOSIS — F329 Major depressive disorder, single episode, unspecified: Secondary | ICD-10-CM | POA: Diagnosis not present

## 2018-08-19 DIAGNOSIS — I10 Essential (primary) hypertension: Secondary | ICD-10-CM | POA: Diagnosis not present

## 2018-08-19 DIAGNOSIS — F329 Major depressive disorder, single episode, unspecified: Secondary | ICD-10-CM | POA: Diagnosis not present

## 2018-08-19 DIAGNOSIS — F0151 Vascular dementia with behavioral disturbance: Secondary | ICD-10-CM | POA: Diagnosis not present

## 2018-08-19 DIAGNOSIS — G35 Multiple sclerosis: Secondary | ICD-10-CM | POA: Diagnosis not present

## 2018-08-19 DIAGNOSIS — T84091D Other mechanical complication of internal left hip prosthesis, subsequent encounter: Secondary | ICD-10-CM | POA: Diagnosis not present

## 2018-08-19 DIAGNOSIS — E039 Hypothyroidism, unspecified: Secondary | ICD-10-CM | POA: Diagnosis not present

## 2018-08-22 DIAGNOSIS — F0151 Vascular dementia with behavioral disturbance: Secondary | ICD-10-CM | POA: Diagnosis not present

## 2018-08-22 DIAGNOSIS — T84091D Other mechanical complication of internal left hip prosthesis, subsequent encounter: Secondary | ICD-10-CM | POA: Diagnosis not present

## 2018-08-22 DIAGNOSIS — I10 Essential (primary) hypertension: Secondary | ICD-10-CM | POA: Diagnosis not present

## 2018-08-22 DIAGNOSIS — E039 Hypothyroidism, unspecified: Secondary | ICD-10-CM | POA: Diagnosis not present

## 2018-08-22 DIAGNOSIS — G35 Multiple sclerosis: Secondary | ICD-10-CM | POA: Diagnosis not present

## 2018-08-22 DIAGNOSIS — F329 Major depressive disorder, single episode, unspecified: Secondary | ICD-10-CM | POA: Diagnosis not present

## 2018-08-23 DIAGNOSIS — T84091D Other mechanical complication of internal left hip prosthesis, subsequent encounter: Secondary | ICD-10-CM | POA: Diagnosis not present

## 2018-08-23 DIAGNOSIS — G35 Multiple sclerosis: Secondary | ICD-10-CM | POA: Diagnosis not present

## 2018-08-23 DIAGNOSIS — F0151 Vascular dementia with behavioral disturbance: Secondary | ICD-10-CM | POA: Diagnosis not present

## 2018-08-23 DIAGNOSIS — I10 Essential (primary) hypertension: Secondary | ICD-10-CM | POA: Diagnosis not present

## 2018-08-23 DIAGNOSIS — F329 Major depressive disorder, single episode, unspecified: Secondary | ICD-10-CM | POA: Diagnosis not present

## 2018-08-23 DIAGNOSIS — E039 Hypothyroidism, unspecified: Secondary | ICD-10-CM | POA: Diagnosis not present

## 2018-08-24 DIAGNOSIS — E039 Hypothyroidism, unspecified: Secondary | ICD-10-CM | POA: Diagnosis not present

## 2018-08-24 DIAGNOSIS — F0151 Vascular dementia with behavioral disturbance: Secondary | ICD-10-CM | POA: Diagnosis not present

## 2018-08-24 DIAGNOSIS — G35 Multiple sclerosis: Secondary | ICD-10-CM | POA: Diagnosis not present

## 2018-08-24 DIAGNOSIS — I10 Essential (primary) hypertension: Secondary | ICD-10-CM | POA: Diagnosis not present

## 2018-08-24 DIAGNOSIS — T84091D Other mechanical complication of internal left hip prosthesis, subsequent encounter: Secondary | ICD-10-CM | POA: Diagnosis not present

## 2018-08-24 DIAGNOSIS — F329 Major depressive disorder, single episode, unspecified: Secondary | ICD-10-CM | POA: Diagnosis not present

## 2018-08-25 DIAGNOSIS — F329 Major depressive disorder, single episode, unspecified: Secondary | ICD-10-CM | POA: Diagnosis not present

## 2018-08-25 DIAGNOSIS — F0151 Vascular dementia with behavioral disturbance: Secondary | ICD-10-CM | POA: Diagnosis not present

## 2018-08-25 DIAGNOSIS — E039 Hypothyroidism, unspecified: Secondary | ICD-10-CM | POA: Diagnosis not present

## 2018-08-25 DIAGNOSIS — T84091D Other mechanical complication of internal left hip prosthesis, subsequent encounter: Secondary | ICD-10-CM | POA: Diagnosis not present

## 2018-08-25 DIAGNOSIS — I10 Essential (primary) hypertension: Secondary | ICD-10-CM | POA: Diagnosis not present

## 2018-08-25 DIAGNOSIS — G35 Multiple sclerosis: Secondary | ICD-10-CM | POA: Diagnosis not present

## 2018-08-26 DIAGNOSIS — G35 Multiple sclerosis: Secondary | ICD-10-CM | POA: Diagnosis not present

## 2018-08-26 DIAGNOSIS — F0151 Vascular dementia with behavioral disturbance: Secondary | ICD-10-CM | POA: Diagnosis not present

## 2018-08-26 DIAGNOSIS — I10 Essential (primary) hypertension: Secondary | ICD-10-CM | POA: Diagnosis not present

## 2018-08-26 DIAGNOSIS — T84091D Other mechanical complication of internal left hip prosthesis, subsequent encounter: Secondary | ICD-10-CM | POA: Diagnosis not present

## 2018-08-26 DIAGNOSIS — F329 Major depressive disorder, single episode, unspecified: Secondary | ICD-10-CM | POA: Diagnosis not present

## 2018-08-26 DIAGNOSIS — E039 Hypothyroidism, unspecified: Secondary | ICD-10-CM | POA: Diagnosis not present

## 2018-08-29 DIAGNOSIS — G35 Multiple sclerosis: Secondary | ICD-10-CM | POA: Diagnosis not present

## 2018-08-29 DIAGNOSIS — F0151 Vascular dementia with behavioral disturbance: Secondary | ICD-10-CM | POA: Diagnosis not present

## 2018-08-29 DIAGNOSIS — I10 Essential (primary) hypertension: Secondary | ICD-10-CM | POA: Diagnosis not present

## 2018-08-29 DIAGNOSIS — E039 Hypothyroidism, unspecified: Secondary | ICD-10-CM | POA: Diagnosis not present

## 2018-08-29 DIAGNOSIS — T84091D Other mechanical complication of internal left hip prosthesis, subsequent encounter: Secondary | ICD-10-CM | POA: Diagnosis not present

## 2018-08-29 DIAGNOSIS — F329 Major depressive disorder, single episode, unspecified: Secondary | ICD-10-CM | POA: Diagnosis not present

## 2018-08-31 DIAGNOSIS — F329 Major depressive disorder, single episode, unspecified: Secondary | ICD-10-CM | POA: Diagnosis not present

## 2018-08-31 DIAGNOSIS — G35 Multiple sclerosis: Secondary | ICD-10-CM | POA: Diagnosis not present

## 2018-08-31 DIAGNOSIS — T84091D Other mechanical complication of internal left hip prosthesis, subsequent encounter: Secondary | ICD-10-CM | POA: Diagnosis not present

## 2018-08-31 DIAGNOSIS — F0151 Vascular dementia with behavioral disturbance: Secondary | ICD-10-CM | POA: Diagnosis not present

## 2018-08-31 DIAGNOSIS — E039 Hypothyroidism, unspecified: Secondary | ICD-10-CM | POA: Diagnosis not present

## 2018-08-31 DIAGNOSIS — I10 Essential (primary) hypertension: Secondary | ICD-10-CM | POA: Diagnosis not present

## 2018-09-01 ENCOUNTER — Encounter: Payer: Self-pay | Admitting: Emergency Medicine

## 2018-09-01 ENCOUNTER — Emergency Department
Admission: EM | Admit: 2018-09-01 | Discharge: 2018-09-02 | Disposition: A | Discharge status: Home / Self Care | Payer: Medicare Other | Attending: Emergency Medicine | Admitting: Emergency Medicine

## 2018-09-01 ENCOUNTER — Other Ambulatory Visit: Payer: Self-pay

## 2018-09-01 ENCOUNTER — Emergency Department: Payer: Medicare Other

## 2018-09-01 DIAGNOSIS — S79912A Unspecified injury of left hip, initial encounter: Secondary | ICD-10-CM | POA: Diagnosis present

## 2018-09-01 DIAGNOSIS — Z8673 Personal history of transient ischemic attack (TIA), and cerebral infarction without residual deficits: Secondary | ICD-10-CM | POA: Insufficient documentation

## 2018-09-01 DIAGNOSIS — Y999 Unspecified external cause status: Secondary | ICD-10-CM | POA: Diagnosis not present

## 2018-09-01 DIAGNOSIS — W010XXA Fall on same level from slipping, tripping and stumbling without subsequent striking against object, initial encounter: Secondary | ICD-10-CM | POA: Insufficient documentation

## 2018-09-01 DIAGNOSIS — T84021A Dislocation of internal left hip prosthesis, initial encounter: Secondary | ICD-10-CM | POA: Diagnosis not present

## 2018-09-01 DIAGNOSIS — Z471 Aftercare following joint replacement surgery: Secondary | ICD-10-CM | POA: Diagnosis not present

## 2018-09-01 DIAGNOSIS — R52 Pain, unspecified: Secondary | ICD-10-CM | POA: Diagnosis not present

## 2018-09-01 DIAGNOSIS — Z79899 Other long term (current) drug therapy: Secondary | ICD-10-CM | POA: Insufficient documentation

## 2018-09-01 DIAGNOSIS — I10 Essential (primary) hypertension: Secondary | ICD-10-CM | POA: Diagnosis not present

## 2018-09-01 DIAGNOSIS — E039 Hypothyroidism, unspecified: Secondary | ICD-10-CM | POA: Diagnosis not present

## 2018-09-01 DIAGNOSIS — W19XXXA Unspecified fall, initial encounter: Secondary | ICD-10-CM

## 2018-09-01 DIAGNOSIS — I252 Old myocardial infarction: Secondary | ICD-10-CM | POA: Insufficient documentation

## 2018-09-01 DIAGNOSIS — S73005A Unspecified dislocation of left hip, initial encounter: Secondary | ICD-10-CM

## 2018-09-01 DIAGNOSIS — Y92000 Kitchen of unspecified non-institutional (private) residence as  the place of occurrence of the external cause: Secondary | ICD-10-CM | POA: Insufficient documentation

## 2018-09-01 DIAGNOSIS — Y93G1 Activity, food preparation and clean up: Secondary | ICD-10-CM | POA: Diagnosis not present

## 2018-09-01 DIAGNOSIS — Y792 Prosthetic and other implants, materials and accessory orthopedic devices associated with adverse incidents: Secondary | ICD-10-CM | POA: Insufficient documentation

## 2018-09-01 DIAGNOSIS — Z96642 Presence of left artificial hip joint: Secondary | ICD-10-CM | POA: Diagnosis not present

## 2018-09-01 DIAGNOSIS — M25552 Pain in left hip: Secondary | ICD-10-CM | POA: Diagnosis not present

## 2018-09-01 DIAGNOSIS — Z7982 Long term (current) use of aspirin: Secondary | ICD-10-CM | POA: Diagnosis not present

## 2018-09-01 LAB — CBC
HCT: 38 % — ABNORMAL LOW (ref 39.0–52.0)
Hemoglobin: 12.1 g/dL — ABNORMAL LOW (ref 13.0–17.0)
MCH: 29.4 pg (ref 26.0–34.0)
MCHC: 31.8 g/dL (ref 30.0–36.0)
MCV: 92.5 fL (ref 80.0–100.0)
Platelets: 454 10*3/uL — ABNORMAL HIGH (ref 150–400)
RBC: 4.11 MIL/uL — ABNORMAL LOW (ref 4.22–5.81)
RDW: 14.1 % (ref 11.5–15.5)
WBC: 14.5 10*3/uL — ABNORMAL HIGH (ref 4.0–10.5)
nRBC: 0 % (ref 0.0–0.2)

## 2018-09-01 LAB — COMPREHENSIVE METABOLIC PANEL
ALT: 46 U/L — ABNORMAL HIGH (ref 0–44)
AST: 141 U/L — ABNORMAL HIGH (ref 15–41)
Albumin: 3.9 g/dL (ref 3.5–5.0)
Alkaline Phosphatase: 125 U/L (ref 38–126)
Anion gap: 11 (ref 5–15)
BUN: 17 mg/dL (ref 6–20)
CO2: 23 mmol/L (ref 22–32)
Calcium: 9.1 mg/dL (ref 8.9–10.3)
Chloride: 107 mmol/L (ref 98–111)
Creatinine, Ser: 1.4 mg/dL — ABNORMAL HIGH (ref 0.61–1.24)
GFR calc Af Amer: >60 mL/min (ref >60)
GFR calc non Af Amer: 57 mL/min — ABNORMAL LOW (ref >60)
Glucose, Bld: 114 mg/dL — ABNORMAL HIGH (ref 70–99)
Potassium: 3.6 mmol/L (ref 3.5–5.1)
Sodium: 141 mmol/L (ref 135–145)
Total Bilirubin: 1.6 mg/dL — ABNORMAL HIGH (ref 0.3–1.2)
Total Protein: 7 g/dL (ref 6.5–8.1)

## 2018-09-01 MED ORDER — ONDANSETRON HCL 4 MG/2ML IJ SOLN
4.0000 mg | Freq: Once | INTRAMUSCULAR | Status: AC
  Administered 2018-09-01: 17:00:00 4 mg via INTRAVENOUS
  Filled 2018-09-01: qty 2

## 2018-09-01 MED ORDER — MORPHINE SULFATE (PF) 4 MG/ML IV SOLN
INTRAVENOUS | Status: AC
  Administered 2018-09-01: 4 mg
  Filled 2018-09-01: qty 1

## 2018-09-01 MED ORDER — MORPHINE SULFATE (PF) 4 MG/ML IV SOLN
4.0000 mg | Freq: Once | INTRAVENOUS | Status: AC
  Administered 2018-09-01: 17:00:00 4 mg via INTRAVENOUS
  Filled 2018-09-01: qty 1

## 2018-09-01 MED ORDER — ETOMIDATE 2 MG/ML IV SOLN
INTRAVENOUS | Status: AC
  Filled 2018-09-01: qty 20

## 2018-09-01 MED ORDER — ETOMIDATE 2 MG/ML IV SOLN
INTRAVENOUS | Status: AC | PRN
  Administered 2018-09-01: 10 mg via INTRAVENOUS
  Administered 2018-09-01: 5 mg via INTRAVENOUS

## 2018-09-01 MED ORDER — SODIUM CHLORIDE 0.9 % IV BOLUS
1000.0000 mL | Freq: Once | INTRAVENOUS | Status: AC
  Administered 2018-09-01: 17:00:00 1000 mL via INTRAVENOUS

## 2018-09-01 NOTE — ED Notes (Signed)
Pt ambulated with walker. He states his gate is normal for how he walk at home r/t MS

## 2018-09-01 NOTE — ED Notes (Signed)
Call to family. Brother will be here to pick him up at 9pm

## 2018-09-01 NOTE — ED Notes (Addendum)
Pt dorsalis pedis pulse located via doppler. Foot is warm and appropriately colored. Cap refill <3 seconds

## 2018-09-01 NOTE — ED Triage Notes (Signed)
Pt via ems from home with mechanical fall. States he tripped on kool aid and fell. He reports that he had hip replacement 3 weeks ago at [facility] hospital. Pt alert & oriented with NAD noted.

## 2018-09-01 NOTE — ED Notes (Signed)
Pt went out to car with family and couldn't get out of the wheelchair into vehicle. Family states he cannot care for himself and they cannot take him home. Pt back to room 17 and plan to transfer to [place]

## 2018-09-01 NOTE — ED Notes (Signed)
Pt signed consent to transfer to [facility]

## 2018-09-01 NOTE — ED Provider Notes (Addendum)
[facility] Emergency Department Provider Note  Time seen: 4:42 PM  I have reviewed the triage vital signs and the nursing notes.   HISTORY  Chief Complaint Fall   HPI Cody Burns is a 53 y.o. male with a past medical history of syncopal episodes in the past, hypertension, MS, prior MI, presents to the emergency department with left hip pain after a fall.  According to the patient he had a left hip replacement 12 years ago, it was causing him a lot of issues approximately 2 to 3 weeks ago had a repeat left hip replacement performed at the [facility] hospital.  Patient states today he was making Kool-Aid when it spilled on the ground he then slipped on the Kool-Aid when he was trying to clean it up causing him to fall onto the left hip.  Patient states significant pain in the left hip is not been able to ambulate since the fall.  Patient denies hitting his head, does not believe he lost consciousness.  Has no other complaints at this time.  Denies any fever cough congestion or recent travel.  Describes his left hip pain as severe 10/10 pain, worse with any type of movement.   Past Medical History:  Diagnosis Date  . 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 cause of his ankle break. awaiting neuro workup  . Hypertension   . Hypothyroidism   . Multiple sclerosis (HCC) 1990  . Myocardial infarct (HCC)   . Optic neuritis    x 3. takes steriods and it resolves.  . Seizures (HCC)    as a child. diagnosed with MS and once treated, seizures resolved.  . Sleep apnea     Patient Active Problem List   Diagnosis Date Noted  . Acute renal failure (ARF) (HCC) 06/05/2017  . Left hip pain 04/05/2017  . Acute kidney injury (HCC) 02/23/2017  . Zoster 02/05/2017  . Syncope 02/03/2017  . Stenosis of carotid artery 01/05/2017  . Carotid stenosis, asymptomatic, right 11/16/2016  . 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 Hx of ischemic left MCA stroke 11/13/2016  . Hypertension, essential 11/13/2016  . Multiple sclerosis, relapsing-remitting (HCC) 11/13/2016  . Carotid occlusion, left 11/13/2016  . Hyperlipidemia LDL goal <70 11/13/2016  . Slurred speech 11/13/2016  . Closed trimalleolar fracture of right ankle 09/25/2016  . Closed right ankle fracture 09/22/2016    Past Surgical History:  Procedure Laterality Date  . CORONARY ARTERY BYPASS GRAFT  2015  . CORONARY STENT INTERVENTION N/A 06/07/2017   Procedure: CORONARY STENT INTERVENTION;  Surgeon: Alwyn Pea, MD;  Location: [facility] INVASIVE CV LAB;  Service: Cardiovascular;  Laterality: N/A;  . JOINT REPLACEMENT Left 2007   left hip replacement  . LEFT HEART CATH AND CORS/GRAFTS ANGIOGRAPHY N/A 06/07/2017   Procedure: LEFT HEART CATH AND CORS/GRAFTS ANGIOGRAPHY;  Surgeon: Lamar Blinks, MD;  Location: [facility] INVASIVE CV LAB;  Service: Cardiovascular;  Laterality: N/A;  . ORIF ANKLE FRACTURE Right 09/22/2016   Procedure: OPEN REDUCTION INTERNAL FIXATION (ORIF) ANKLE FRACTURE;  Surgeon: Christena Flake, MD;  Location: [facility] ORS;  Service: Orthopedics;  Laterality: Right;  . quadruple cardiac bypass  2015    Prior to Admission medications   Medication Sig Start Date End Date Taking? Authorizing Provider  aspirin EC 81 MG tablet Take 81 mg by mouth daily.    [provider]  atorvastatin (LIPITOR) 80 MG tablet Take 80 mg by mouth at bedtime.  [provider]  buPROPion (WELLBUTRIN XL) 300 MG 24 hr tablet Take 300 mg by mouth daily.    [provider]  clopidogrel (PLAVIX) 75 MG tablet Take 75 mg by mouth daily.    [provider]  cyclobenzaprine (FLEXERIL) 10 MG tablet Take 1 tablet (10 mg total) by mouth 3 (three) times daily. 06/11/17   Shaune Pollack, MD  docusate sodium (COLACE) 50 MG capsule Take 50 mg by mouth 2 (two) times daily.    [provider]  escitalopram (LEXAPRO) 20 MG tablet Take 20 mg by  mouth daily.    [provider]  folic acid (FOLVITE) 400 MCG tablet Take 400 mcg by mouth daily.    [provider]  gabapentin (NEURONTIN) 300 MG capsule Take 900 mg by mouth 3 (three) times daily.     [provider]  lamoTRIgine (LAMICTAL) 25 MG tablet Take 25 mg by mouth daily.    [provider]  levothyroxine (SYNTHROID, LEVOTHROID) 25 MCG tablet Take 25 mcg by mouth daily before breakfast.     [provider]  lisinopril (PRINIVIL,ZESTRIL) 20 MG tablet Take 20 mg by mouth daily.     [provider]  montelukast (SINGULAIR) 10 MG tablet Take 10 mg by mouth daily.     [provider]  niacin (NIASPAN) 500 MG CR tablet Take 500 mg by mouth at bedtime. 11/24/16 12/28/17  [provider]  QUEtiapine (SEROQUEL) 100 MG tablet Take 100 mg by mouth at bedtime.    [provider]  vitamin B-12 (CYANOCOBALAMIN) 1000 MCG tablet Take 1,000 mcg by mouth daily.  07/09/09   [provider]    Allergies  Allergen Reactions  . Hydrochlorothiazide W-Triamterene Other (See Comments)    High blood pressure  . Metoprolol Other (See Comments)    Exacerbates MS, High blood pressure  . Penicillins Other (See Comments)    Sores in mouth Has patient had a PCN reaction causing immediate rash, facial/tongue/throat swelling, SOB or lightheadedness with hypotension: No Has patient had a PCN reaction causing severe rash involving mucus membranes or skin necrosis: No Has patient had a PCN reaction that required hospitalization: No Has patient had a PCN reaction occurring within the last 10 years: No If all of the above answers are "NO", then may proceed with Cephalosporin use.     Family History  Problem Relation Age of Onset  . Cancer Mother   . Other Father     Social History Social History   Tobacco Use  . Smoking status: Never Smoker  . Smokeless tobacco: Former Neurosurgeon    Types: Chew  . Tobacco comment: rarely uses  smokeless tobacco  Substance Use Topics  . Alcohol use: Yes    Comment: maybe a 6 pack per week  . Drug use: No    Review of Systems Constitutional: Negative for fever. ENT: Negative for recent illness/congestion Cardiovascular: Negative for chest pain. Respiratory: Negative for shortness of breath. Gastrointestinal: Negative for abdominal pain Musculoskeletal: Left hip pain. Skin: Negative for skin complaints  Neurological: Negative for headache All other ROS negative  ____________________________________________   PHYSICAL EXAM:  VITAL SIGNS: ED Triage Vitals  Enc Vitals Group     BP 09/01/18 1631 (!) 160/93     Pulse Rate 09/01/18 1631 (!) 115     Resp 09/01/18 1631 (!) 28     Temp 09/01/18 1631 98.6 F (37 C)     Temp Source 09/01/18 1631 Oral  SpO2 09/01/18 1631 100 %     Weight 09/01/18 1633 229 lb 15 oz (104.3 kg)     Height 09/01/18 1633 5\' 8"  (1.727 m)     Head Circumference --      Peak Flow --      Pain Score 09/01/18 1632 10     Pain Loc --      Pain Edu? --      Excl. in GC? --    Constitutional: Alert and oriented. Well appearing and in no distress. Eyes: Normal exam ENT   Head: Normocephalic and atraumatic.   Mouth/Throat: Mucous membranes are moist. Cardiovascular: Normal rate, regular rhythm. No murmurs, rubs, or gallops. Respiratory: Normal respiratory effort without tachypnea nor retractions. Breath sounds are clear  Gastrointestinal: Soft and nontender. No distention. Musculoskeletal: Moderate left hip tenderness to palpation.  Significant pain with any attempted range of motion.  Neurovascular intact distally. Neurologic:  Normal speech and language. No gross focal neurologic deficits Skin:  Skin is warm, dry and intact.  Psychiatric: Mood and affect are normal.  ____________________________________________    RADIOLOGY   IMPRESSION: Dislocation left total hip  replacement.  ____________________________________________   INITIAL IMPRESSION / ASSESSMENT AND PLAN / ED COURSE  Pertinent labs & imaging results that were available during my care of the patient were reviewed by me and considered in my medical decision making (see chart for details).  Patient presents to the emergency department after a fall with left hip pain.  Recent hip replacement 2 to 3 weeks ago.  Patient has significant pain in the leg.  Neurovascular intact distally.  We will treat with pain medication, check basic labs and obtain repeat x-rays.  Patient has a dislocated prosthetic left hip.  We will attempt hip reduction in the emergency department.  I discussed with the patient risk and benefits of attempted hip reduction including risk of sedation as well as fracture.  Patient agreeable to plan of care.  We will plan moderate/deep sedation with etomidate.  Patient remains neurovascular intact distally.  Patient sedated and hip reduction performed successfully.  Repeat x-ray shows successful hip reduction.  We will attempt to speak to the [place] orth[facility] group to discuss disposition.  I discussed the patient with orthopedics at the [facility], recommends attempted ambulation.  Patient has ambulated without difficulty using a walker.  Patient does admit to heavy alcohol use, I had a discussion with the patient regarding his alcohol use he does not want detox.  Patient states he has pain medication at home.  We will provide outpatient resources for the patient, during [place] [facility] said they will follow-up with the patient.  I discussed precautions to be taken with the patient including not flexing, sleeping with a pillow between his legs to keep in abduction.  Patient agreeable to plan of care.  Patient was unable to get into a vehicle to be discharged home.  Family states the patient has a flight of stairs at home to get into his apartment.  Patient states there is no way he would be  able to do that in his current condition or state.  [facility]  Reduction of dislocation Date/Time: 7:05 PM Performed by: Minna Antis Authorized by: Minna Antis Consent: Verbal consent obtained. Risks and benefits: risks, benefits and alternatives were discussed Consent given by: patient Required items: required blood products, implants, devices, and special equipment available Time out: Immediately prior to procedure a "time out" was called to  verify the correct patient, procedure, equipment, support staff and site/side marked as required.  Patient sedated: Yes  Vitals: Vital signs were monitored during sedation. Patient tolerance: Patient tolerated the procedure well with no immediate complications. Joint: Left hp Reduction technique: traction    .Sedation Date/Time: 09/01/2018 6:39 PM Performed by: Minna AntisPaduchowski, Bently Wyss, MD Authorized by: Minna AntisPaduchowski, Christop Hippert, MD   Consent:    Consent obtained:  Written (electronic informed consent)   Consent given by:  Patient   Risks discussed:  Allergic reaction, dysrhythmia, inadequate sedation, nausea, vomiting, respiratory compromise necessitating ventilatory assistance and intubation, prolonged sedation necessitating reversal and prolonged hypoxia resulting in organ damage   Alternatives discussed:  Analgesia without sedation Universal protocol:    Procedure explained and questions answered to patient or proxy's satisfaction: yes     Relevant documents present and verified: yes     Test results available and properly labeled: yes     Imaging studies available: yes     Required blood products, implants, devices, and special equipment available: yes     Immediately prior to procedure a time out was called: yes     Patient identity confirmation method:  Arm band Indications:    Procedure performed:  Dislocation reduction   Procedure necessitating sedation performed by:  Physician performing  sedation Pre-sedation assessment:    Time since last food or drink:  Approx 4 hours   NPO status caution: urgency dictates proceeding with non-ideal NPO status     ASA classification: class 2 - patient with mild systemic disease     Neck mobility: normal     Mouth opening:  3 or more finger widths   Mallampati score:  II - soft palate, uvula, fauces visible   Pre-sedation assessments completed and reviewed: airway patency, cardiovascular function, mental status and respiratory function   Immediate pre-procedure details:    Reassessment: Patient reassessed immediately prior to procedure     Reviewed: vital signs, relevant labs/tests and NPO status     Verified: bag valve mask available, emergency equipment available, intubation equipment available, IV patency confirmed, oxygen available, reversal medications available and suction available   Procedure details (see MAR for exact dosages):    Preoxygenation:  Nasal cannula   Sedation:  Etomidate   Analgesia:  Morphine   Intra-procedure monitoring:  Blood pressure monitoring, continuous pulse oximetry, cardiac monitor, frequent vital sign checks and frequent LOC assessments   Total Provider sedation time (minutes):  20 Post-procedure details:    Attendance: Constant attendance by certified staff until patient recovered     Recovery: Patient returned to pre-procedure baseline     Post-sedation assessments completed and reviewed: airway patency, cardiovascular function, hydration status, mental status and respiratory function     Patient is stable for discharge or admission: yes     Patient tolerance:  Tolerated well, no immediate complications     ____________________________________________   FINAL CLINICAL IMPRESSION(S) / ED DIAGNOSES  Left hip pain Left hip prosthetic dislocation   Minna AntisPaduchowski, Duayne Brideau, MD 09/01/18 2031    Minna AntisPaduchowski, Jerie Basford, MD 09/01/18 2214

## 2018-09-02 DIAGNOSIS — W19XXXA Unspecified fall, initial encounter: Secondary | ICD-10-CM | POA: Diagnosis not present

## 2018-09-02 DIAGNOSIS — M2550 Pain in unspecified joint: Secondary | ICD-10-CM | POA: Diagnosis not present

## 2018-09-02 DIAGNOSIS — I10 Essential (primary) hypertension: Secondary | ICD-10-CM | POA: Diagnosis not present

## 2018-09-02 DIAGNOSIS — Z7401 Bed confinement status: Secondary | ICD-10-CM | POA: Diagnosis not present

## 2018-09-12 ENCOUNTER — Encounter: Payer: Self-pay | Admitting: *Deleted

## 2018-09-12 ENCOUNTER — Other Ambulatory Visit: Payer: Self-pay

## 2018-09-12 ENCOUNTER — Other Ambulatory Visit: Payer: Self-pay | Admitting: *Deleted

## 2018-09-12 NOTE — Addendum Note (Signed)
Addended by: Almetta Lovely on: 09/12/2018 02:30 PM   Modules accepted: Orders

## 2018-09-12 NOTE — Patient Outreach (Signed)
[facility]N Screening. Call to [facility] to follow up on pt. Was advised he was discharged. Called and spoke the Cody Burns, who reports he is now at [facility] for rehab.  Pt verified his name and DOB. I advised him of our services. He does not know when he is going to be discharged to his independent home. Will send him our information for his future reference to [facility].  Zara Council. Burgess Estelle, MSN, [facility] Gerontological Nurse Practitioner [facility] Care Management [phone]

## 2018-09-13 DIAGNOSIS — G35 Multiple sclerosis: Secondary | ICD-10-CM | POA: Diagnosis not present

## 2018-09-13 DIAGNOSIS — Z7982 Long term (current) use of aspirin: Secondary | ICD-10-CM | POA: Diagnosis not present

## 2018-09-13 DIAGNOSIS — I10 Essential (primary) hypertension: Secondary | ICD-10-CM | POA: Diagnosis not present

## 2018-09-13 DIAGNOSIS — T84091D Other mechanical complication of internal left hip prosthesis, subsequent encounter: Secondary | ICD-10-CM | POA: Diagnosis not present

## 2018-09-13 DIAGNOSIS — E785 Hyperlipidemia, unspecified: Secondary | ICD-10-CM | POA: Diagnosis not present

## 2018-09-13 DIAGNOSIS — E039 Hypothyroidism, unspecified: Secondary | ICD-10-CM | POA: Diagnosis not present

## 2018-09-13 DIAGNOSIS — Z9181 History of falling: Secondary | ICD-10-CM | POA: Diagnosis not present

## 2018-09-13 DIAGNOSIS — F0151 Vascular dementia with behavioral disturbance: Secondary | ICD-10-CM | POA: Diagnosis not present

## 2018-09-13 DIAGNOSIS — Z79891 Long term (current) use of opiate analgesic: Secondary | ICD-10-CM | POA: Diagnosis not present

## 2018-09-13 DIAGNOSIS — Z7901 Long term (current) use of anticoagulants: Secondary | ICD-10-CM | POA: Diagnosis not present

## 2018-09-13 DIAGNOSIS — F329 Major depressive disorder, single episode, unspecified: Secondary | ICD-10-CM | POA: Diagnosis not present

## 2018-09-14 ENCOUNTER — Other Ambulatory Visit: Payer: Self-pay | Admitting: *Deleted

## 2018-09-14 NOTE — Patient Outreach (Signed)
[facility]) Care Management  09/14/2018  Cody Burns 1966/02/21 [id number]   Care Coordination with [name], NP. Patient is residing at [facility].  Patient will need [facility] Care management upon discharge for level of care and care coordination needs.  Plan to monitor for discharge for [facility] and LCSW [name]. [name], MSN, RN, [facility], CCM  Post Acute Chartered loss adjuster [phone]) Business Cell  [phone]) Toll Free Office

## 2018-09-20 DIAGNOSIS — T84021D Dislocation of internal left hip prosthesis, subsequent encounter: Secondary | ICD-10-CM | POA: Diagnosis not present

## 2018-09-26 ENCOUNTER — Telehealth: Payer: Self-pay | Admitting: *Deleted

## 2018-09-26 NOTE — Patient Outreach (Signed)
[facility]) Care Management  09/26/2018  Cody Burns May 19, 1966 [id number]   CSW had received referral from [facility] NP, Almetta Lovely that patient needs in home help and to start planning for permament long term care due to his MS and lives alone. He will likely need independent or assisted living unless he can pay for 24/7 in home care. Patient has had multiple falls with fractures. CSW made an initial attempt to try and contact patient today to perform phone assessment, as well as assess and assist with social needs and services, without success. CSW was unable to leave a voicemail for patient as his phone went straight to voicemail and his mailbox had not been setup yet. CSW attempted to call & speak with patient at [facility]  (ph#: [phone]) where he was admitted on 09/08/18 but CSW was informed by his nurse that he had just discharged this morning. CSW will have CMA mail unsuccessful outreach letter & make a second outreach attempt within the next 3-4 days, if CSW does not receive a return call from patient in the meantime.    Lincoln Maxin, LCSW [facility]  Clinical Social Worker cell #: [phone]

## 2018-09-30 DIAGNOSIS — F329 Major depressive disorder, single episode, unspecified: Secondary | ICD-10-CM | POA: Diagnosis not present

## 2018-09-30 DIAGNOSIS — F0151 Vascular dementia with behavioral disturbance: Secondary | ICD-10-CM | POA: Diagnosis not present

## 2018-09-30 DIAGNOSIS — I10 Essential (primary) hypertension: Secondary | ICD-10-CM | POA: Diagnosis not present

## 2018-09-30 DIAGNOSIS — T84091D Other mechanical complication of internal left hip prosthesis, subsequent encounter: Secondary | ICD-10-CM | POA: Diagnosis not present

## 2018-09-30 DIAGNOSIS — E039 Hypothyroidism, unspecified: Secondary | ICD-10-CM | POA: Diagnosis not present

## 2018-09-30 DIAGNOSIS — G35 Multiple sclerosis: Secondary | ICD-10-CM | POA: Diagnosis not present

## 2018-10-03 DIAGNOSIS — F0151 Vascular dementia with behavioral disturbance: Secondary | ICD-10-CM | POA: Diagnosis not present

## 2018-10-03 DIAGNOSIS — T84091D Other mechanical complication of internal left hip prosthesis, subsequent encounter: Secondary | ICD-10-CM | POA: Diagnosis not present

## 2018-10-03 DIAGNOSIS — F329 Major depressive disorder, single episode, unspecified: Secondary | ICD-10-CM | POA: Diagnosis not present

## 2018-10-03 DIAGNOSIS — E039 Hypothyroidism, unspecified: Secondary | ICD-10-CM | POA: Diagnosis not present

## 2018-10-03 DIAGNOSIS — G35 Multiple sclerosis: Secondary | ICD-10-CM | POA: Diagnosis not present

## 2018-10-03 DIAGNOSIS — I10 Essential (primary) hypertension: Secondary | ICD-10-CM | POA: Diagnosis not present

## 2018-10-04 DIAGNOSIS — T84091D Other mechanical complication of internal left hip prosthesis, subsequent encounter: Secondary | ICD-10-CM | POA: Diagnosis not present

## 2018-10-04 DIAGNOSIS — I10 Essential (primary) hypertension: Secondary | ICD-10-CM | POA: Diagnosis not present

## 2018-10-04 DIAGNOSIS — G35 Multiple sclerosis: Secondary | ICD-10-CM | POA: Diagnosis not present

## 2018-10-04 DIAGNOSIS — E039 Hypothyroidism, unspecified: Secondary | ICD-10-CM | POA: Diagnosis not present

## 2018-10-04 DIAGNOSIS — F0151 Vascular dementia with behavioral disturbance: Secondary | ICD-10-CM | POA: Diagnosis not present

## 2018-10-04 DIAGNOSIS — F329 Major depressive disorder, single episode, unspecified: Secondary | ICD-10-CM | POA: Diagnosis not present

## 2018-10-07 DIAGNOSIS — F0151 Vascular dementia with behavioral disturbance: Secondary | ICD-10-CM | POA: Diagnosis not present

## 2018-10-07 DIAGNOSIS — T84091D Other mechanical complication of internal left hip prosthesis, subsequent encounter: Secondary | ICD-10-CM | POA: Diagnosis not present

## 2018-10-07 DIAGNOSIS — F329 Major depressive disorder, single episode, unspecified: Secondary | ICD-10-CM | POA: Diagnosis not present

## 2018-10-07 DIAGNOSIS — E039 Hypothyroidism, unspecified: Secondary | ICD-10-CM | POA: Diagnosis not present

## 2018-10-07 DIAGNOSIS — I10 Essential (primary) hypertension: Secondary | ICD-10-CM | POA: Diagnosis not present

## 2018-10-07 DIAGNOSIS — G35 Multiple sclerosis: Secondary | ICD-10-CM | POA: Diagnosis not present

## 2018-10-10 DIAGNOSIS — F329 Major depressive disorder, single episode, unspecified: Secondary | ICD-10-CM | POA: Diagnosis not present

## 2018-10-10 DIAGNOSIS — E039 Hypothyroidism, unspecified: Secondary | ICD-10-CM | POA: Diagnosis not present

## 2018-10-10 DIAGNOSIS — G35 Multiple sclerosis: Secondary | ICD-10-CM | POA: Diagnosis not present

## 2018-10-10 DIAGNOSIS — F0151 Vascular dementia with behavioral disturbance: Secondary | ICD-10-CM | POA: Diagnosis not present

## 2018-10-10 DIAGNOSIS — I10 Essential (primary) hypertension: Secondary | ICD-10-CM | POA: Diagnosis not present

## 2018-10-10 DIAGNOSIS — T84091D Other mechanical complication of internal left hip prosthesis, subsequent encounter: Secondary | ICD-10-CM | POA: Diagnosis not present

## 2018-10-12 ENCOUNTER — Telehealth: Payer: Self-pay | Admitting: *Deleted

## 2018-10-12 DIAGNOSIS — F329 Major depressive disorder, single episode, unspecified: Secondary | ICD-10-CM | POA: Diagnosis not present

## 2018-10-12 DIAGNOSIS — T84091D Other mechanical complication of internal left hip prosthesis, subsequent encounter: Secondary | ICD-10-CM | POA: Diagnosis not present

## 2018-10-12 DIAGNOSIS — E039 Hypothyroidism, unspecified: Secondary | ICD-10-CM | POA: Diagnosis not present

## 2018-10-12 DIAGNOSIS — I10 Essential (primary) hypertension: Secondary | ICD-10-CM | POA: Diagnosis not present

## 2018-10-12 DIAGNOSIS — G35 Multiple sclerosis: Secondary | ICD-10-CM | POA: Diagnosis not present

## 2018-10-12 DIAGNOSIS — F0151 Vascular dementia with behavioral disturbance: Secondary | ICD-10-CM | POA: Diagnosis not present

## 2018-10-12 NOTE — Patient Outreach (Signed)
[facility]) Care Management  10/12/2018  Cody Burns 02-09-1966 [id number]   CSW called & spoke with patient to discuss referral received from [facility] NP, Almetta Lovely regarding need for long term planning regarding assistance in-home or independent/assisted living. Patient states that he has been home from [facility] for about 2 weeks now and has progressed to where home health had discharged him and he feels "much stronger now". CSW spoke with patient about his history of falls and concerns about him living alone. Patient declined assistance in the home or discussion about looking into independent or assisted living facilities at this time stating "I think I'm getting stronger everyday, I just do things without thinking but now I've learned to slow down and think about what I'm going to do before doing it". Patient gave the example of cooking, that he would get all the ingredients and things and lay them out on the counter before starting to cook so he wouldn't have to go back and forth as much. Patient reports that he has a brother, Cody Burns who lives around the corner from him and is able to help out from time to time when needed, but he still drives and transports himself to his own doctors appointments. Patient states that he has an appointment coming up May 29th at the [place] in [place] that he plans to drive himself to. CSW encouraged patient to discuss with the caseworker about "Aide & Attendance" as it is a process to apply and get qualified for, that it would behoove him to go ahead & get the process started. Patient seemed apprehensive but agreed to discuss with a caseworker at the [place] next time he went. Patient declined further CSW assistance & CSW encourgaed patient to call if needed. CSW will close case at this time.    Lincoln Maxin, LCSW [facility]  Clinical Social Worker cell #: [phone]

## 2018-10-22 DIAGNOSIS — I959 Hypotension, unspecified: Secondary | ICD-10-CM | POA: Diagnosis not present

## 2018-10-22 DIAGNOSIS — R231 Pallor: Secondary | ICD-10-CM | POA: Diagnosis not present

## 2018-10-22 DIAGNOSIS — R0902 Hypoxemia: Secondary | ICD-10-CM | POA: Diagnosis not present

## 2018-10-22 DIAGNOSIS — R42 Dizziness and giddiness: Secondary | ICD-10-CM | POA: Diagnosis not present

## 2018-10-22 DIAGNOSIS — R52 Pain, unspecified: Secondary | ICD-10-CM | POA: Diagnosis not present

## 2018-11-09 ENCOUNTER — Telehealth: Payer: Self-pay

## 2018-11-09 NOTE — Telephone Encounter (Signed)
Received message from [facility] with intrafusion stating the pt no-showed his ocrevus infusion today. Leanne has reached out to the pt and left a message in regards to miss appointment.

## 2019-03-26 IMAGING — CT CT CERVICAL SPINE W/O CM
3 of 4 series · 10 of 33 positions shown, 12 images · non-contrast
Comparison: 06/05/2017

CLINICAL DATA: Neck pain, fall

EXAM:
CT CERVICAL SPINE WITHOUT CONTRAST
TECHNIQUE: Multidetector CT imaging of the cervical spine was performed without
intravenous contrast. Multiplanar CT image reconstructions were also
generated.

[Series 6: sagittal bone · sagittal · 0.24mm/px · 5 of 83 slices shown, 6 images]
[im 28/83  bone]
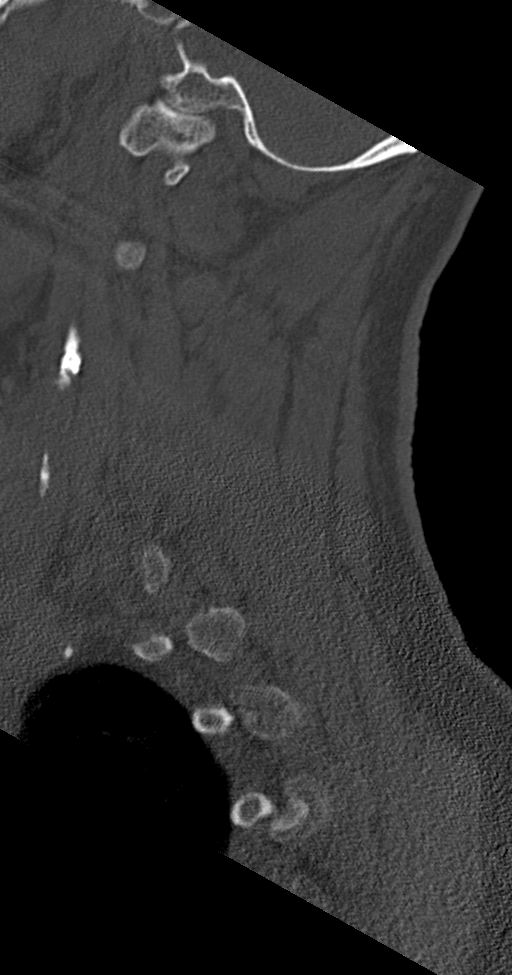
[im 35/83  bone]
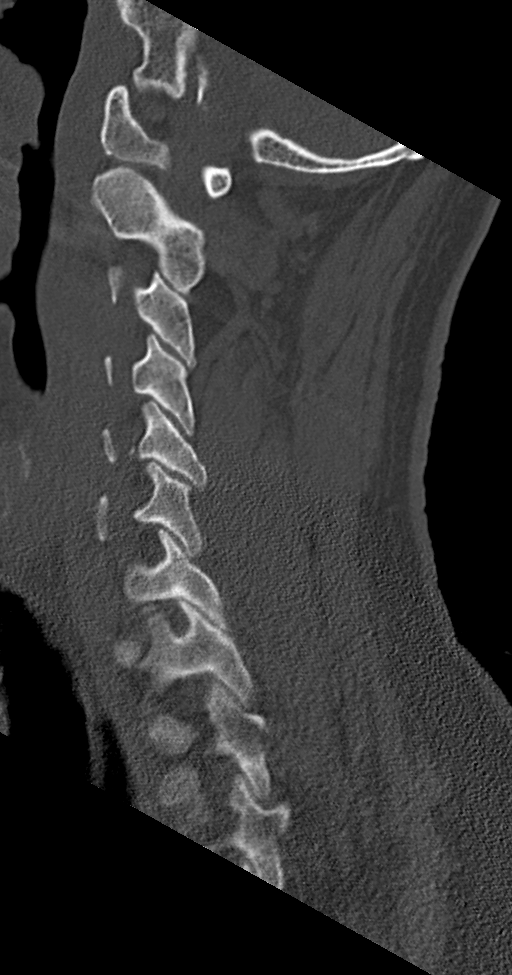
[im 42/83  soft-tissue]
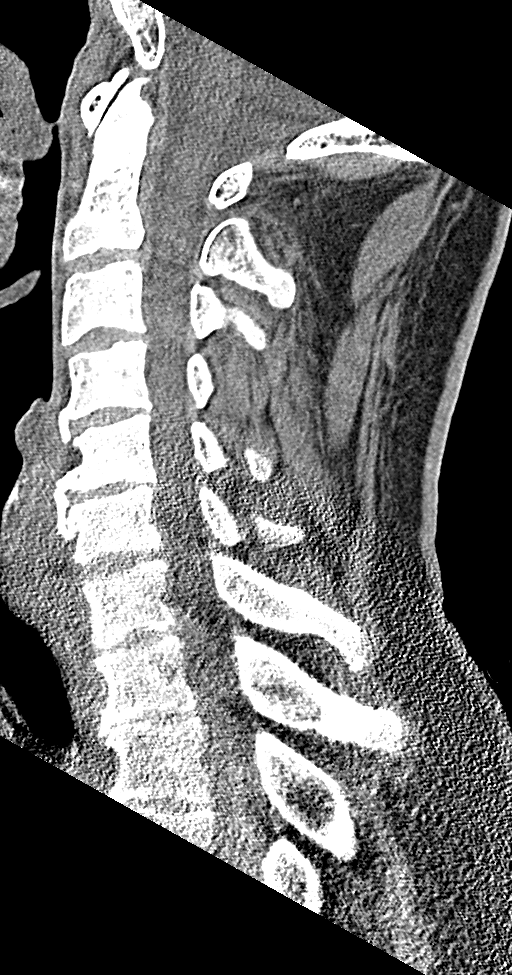
[im 42/83  bone]
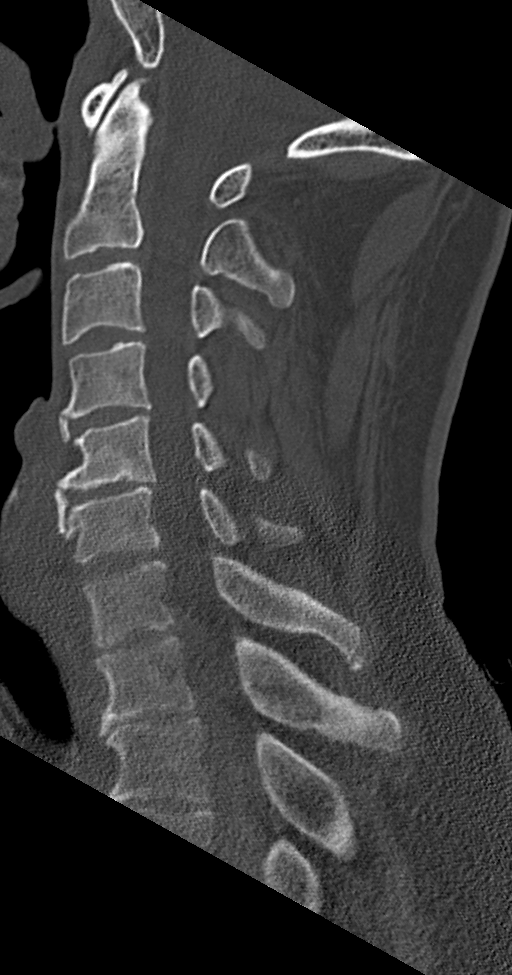
[im 48/83  bone]
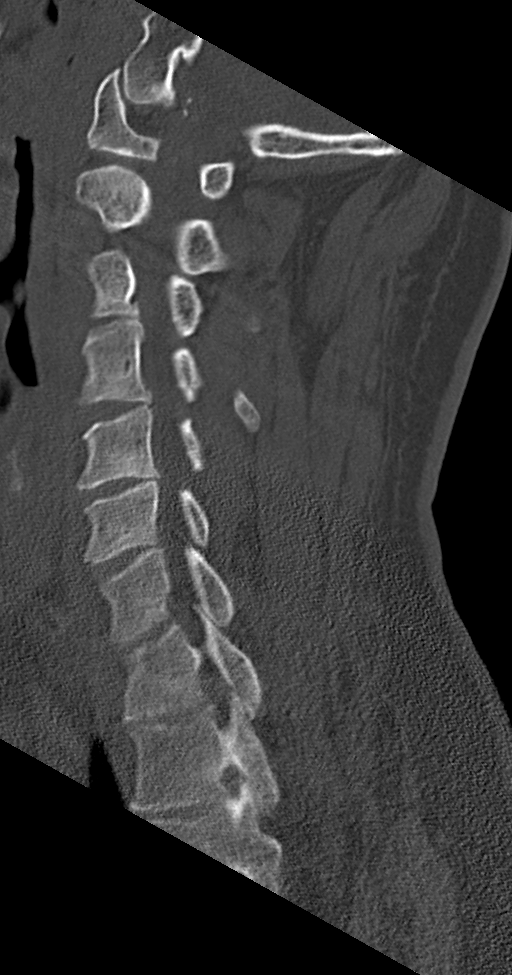
[im 55/83  bone]
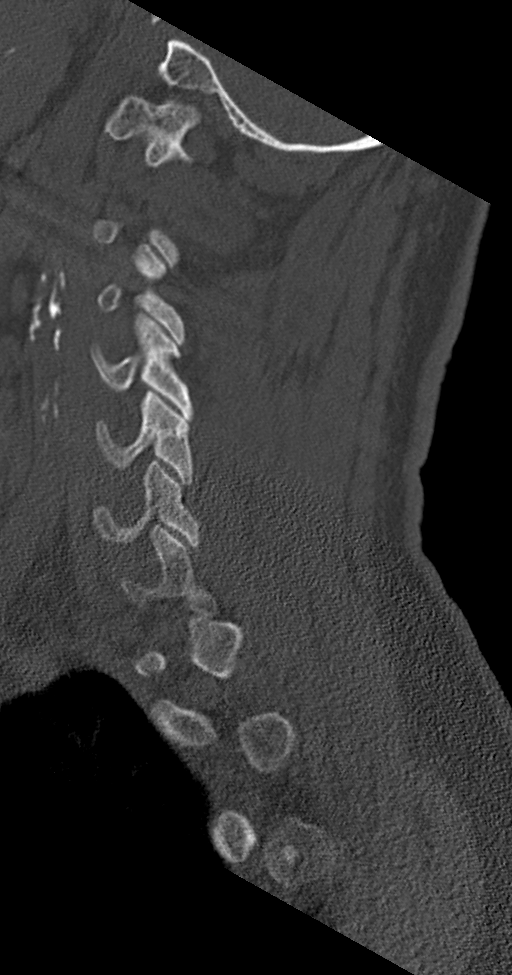

[Series 7: coronal bone · coronal · 0.32mm/px · 3 of 63 slices shown]
[im 17/63  bone]
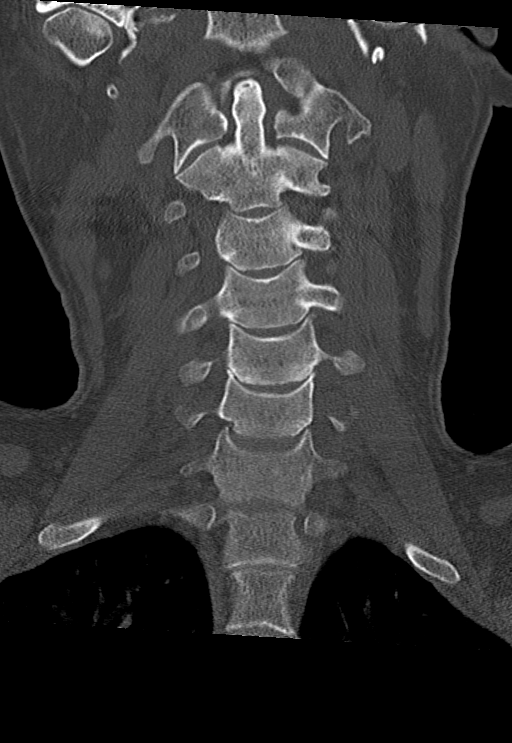
[im 27/63  bone]
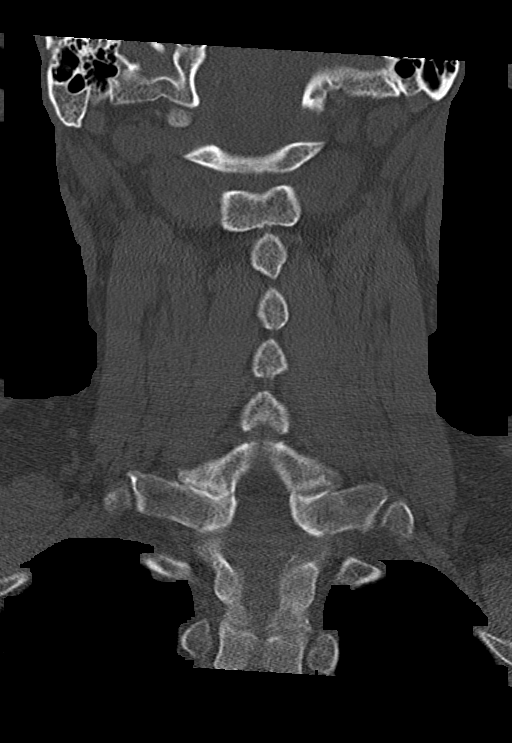
[im 37/63  bone]
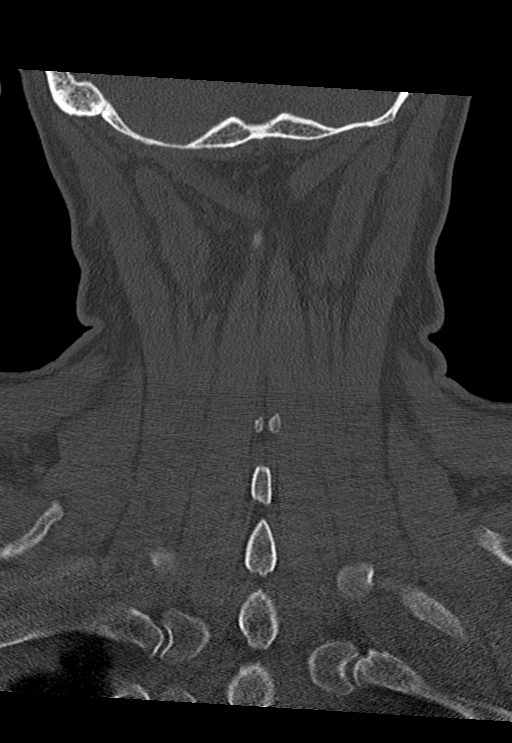

[Series 8: orthogonal bone · axial · 0.24mm/px · z∈[-314,-225]mm · 2 of 120 slices shown, 3 images]
[im 35/120  soft-tissue]
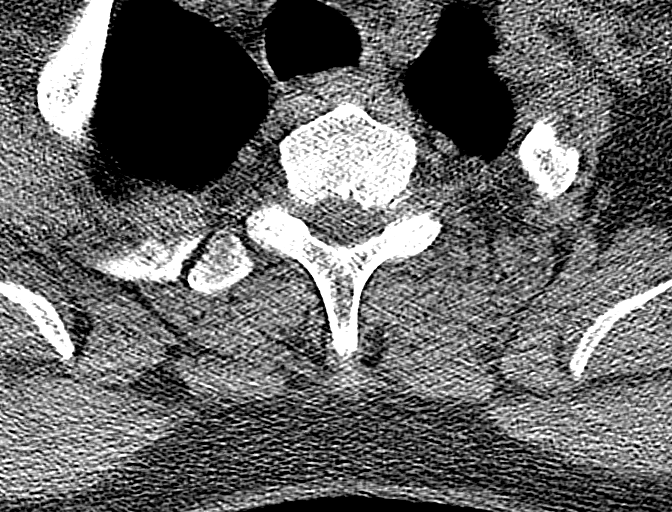
[im 35/120  bone]
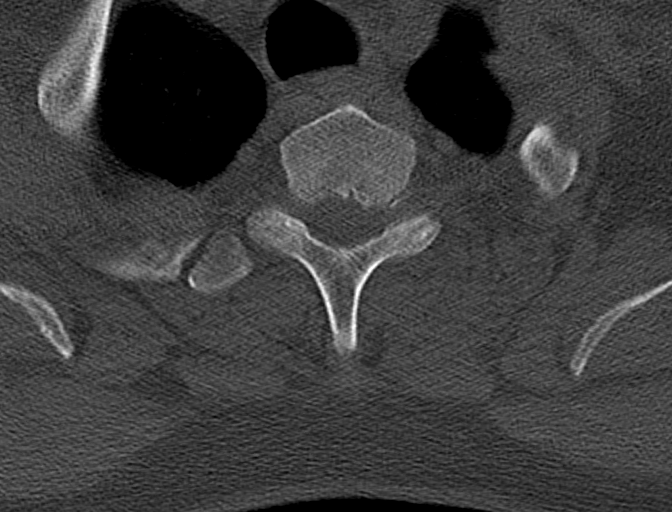
[im 86/120  bone]
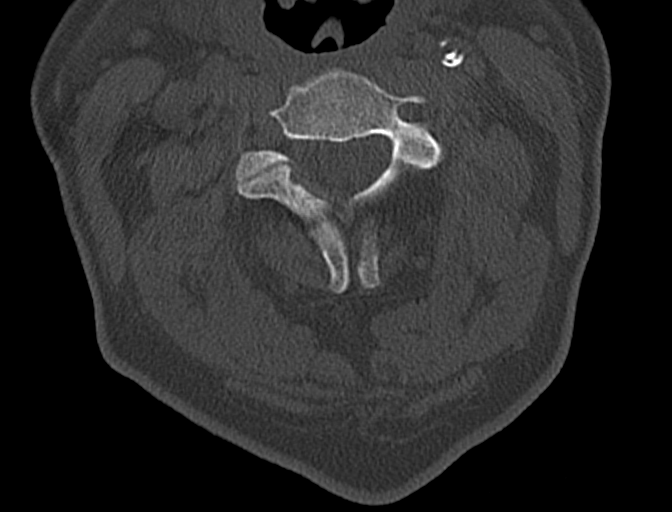

[10 of 33 positions shown; findings below may reference images not displayed]

FINDINGS: Alignment: Normal

Skull base and vertebrae: No fracture

Soft tissues and spinal canal: Prevertebral soft tissues are normal.
No epidural or paraspinal hematoma.

Disc levels: Anterior degenerative spurring at C4-5 and C5-6 and in
the upper thoracic spine.

Upper chest: No acute findings

Other: No acute findings carotid artery calcifications.
IMPRESSION: No acute bony abnormality.

## 2019-03-26 IMAGING — DX DG CHEST 1V PORT
1 series · 1 of 1 positions shown · non-contrast
Comparison: None.

CLINICAL DATA: Chest pressure since this afternoon.

EXAM:
PORTABLE CHEST 1 VIEW

[chest ap]
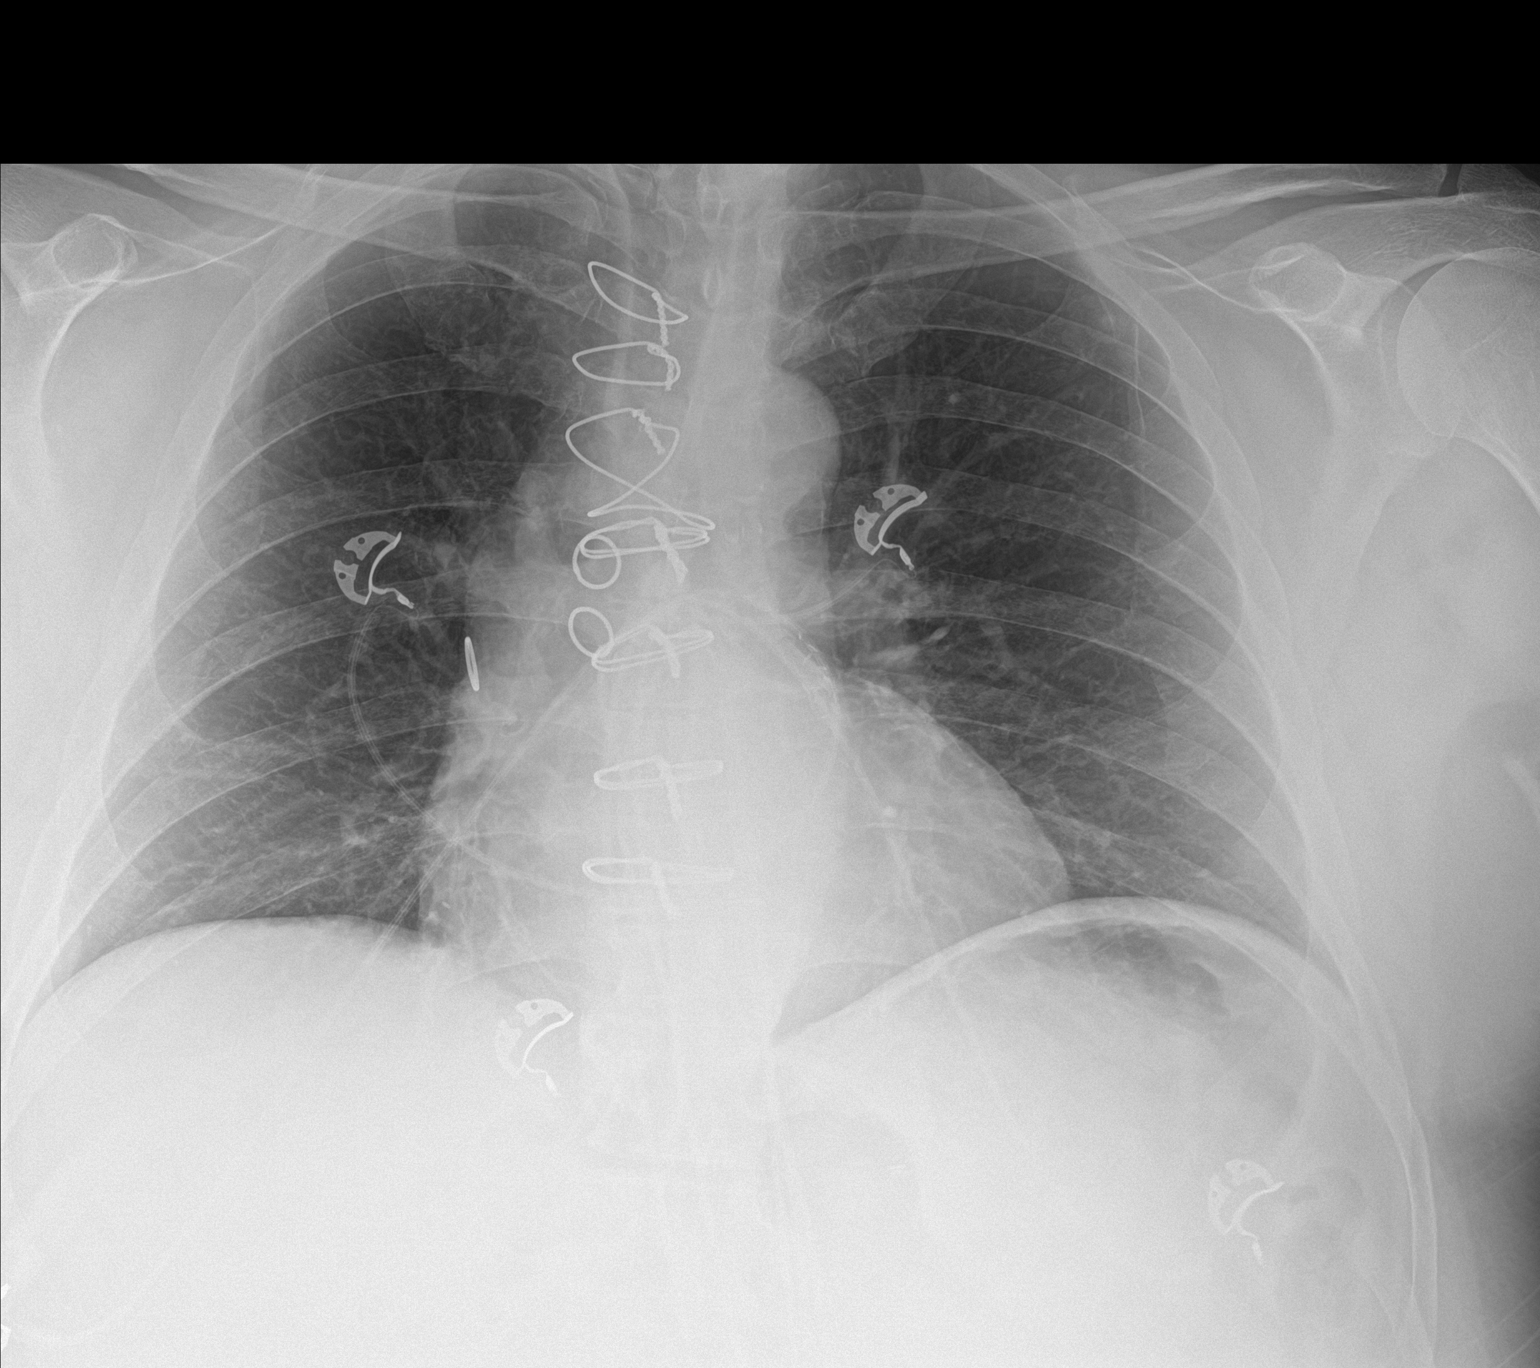

[1 of 1 positions shown; findings below may reference images not displayed]

FINDINGS: Normal sized heart. Tortuous aorta. Post CABG changes. Clear lungs
with normal vascularity. Unremarkable bones.
IMPRESSION: No acute abnormality.

## 2019-12-08 ENCOUNTER — Ambulatory Visit
Admission: EM | Admit: 2019-12-08 | Discharge: 2019-12-08 | Disposition: A | Discharge status: Other Acute Inpatient Hospital | Payer: Medicare Other | Attending: Emergency Medicine | Admitting: Emergency Medicine

## 2019-12-08 ENCOUNTER — Other Ambulatory Visit: Payer: Self-pay

## 2019-12-08 DIAGNOSIS — S51812A Laceration without foreign body of left forearm, initial encounter: Secondary | ICD-10-CM

## 2019-12-08 NOTE — Discharge Instructions (Addendum)
We have placed Steri-Strips and a pressure dressing.  I think that you are stable to drive yourself.  Tell the ED that I am concerned that you have cut an arteriole or artery.

## 2019-12-08 NOTE — ED Provider Notes (Signed)
HPI  SUBJECTIVE:  Cody Burns is a 54 y.o. male who presents with laceration to his left forearm sustained immediately prior to arrival.  Patient states he accidentally cut himself with a clean box cutter.  He put tape on it and came immediately here.  Denies numbness, tingling,  wrist, finger, hand weakness.  He has a past medical history of MS, MI, stroke on Eliquis.    Past Medical History:  Diagnosis Date  . 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 cause of his ankle break. awaiting neuro workup  . Hypertension   . Hypothyroidism   . Multiple sclerosis (HCC) 1990  . Myocardial infarct (HCC)   . Optic neuritis    x 3. takes steriods and it resolves.  . Seizures (HCC)    as a child. diagnosed with MS and once treated, seizures resolved.  . Sleep apnea     Past Surgical History:  Procedure Laterality Date  . CORONARY ARTERY BYPASS GRAFT  2015  . CORONARY STENT INTERVENTION N/A 06/07/2017   Procedure: CORONARY STENT INTERVENTION;  Surgeon: Alwyn Pea, MD;  Location: [facility];  Service: Cardiovascular;  Laterality: N/A;  . JOINT REPLACEMENT Left 2007   left hip replacement  . LEFT HEART CATH AND CORS/GRAFTS ANGIOGRAPHY N/A 06/07/2017   Procedure: LEFT HEART CATH AND CORS/GRAFTS ANGIOGRAPHY;  Surgeon: Lamar Blinks, MD;  Location: [facility];  Service: Cardiovascular;  Laterality: N/A;  . ORIF ANKLE FRACTURE Right 09/22/2016   Procedure: OPEN REDUCTION INTERNAL FIXATION (ORIF) ANKLE FRACTURE;  Surgeon: Christena Flake, MD;  Location: [facility] ORS;  Service: Orthopedics;  Laterality: Right;  . quadruple cardiac bypass  2015    Family History  Problem Relation Age of Onset  . Cancer Mother   . Other Father     Social History   Tobacco Use  . Smoking status: Never Smoker  . Smokeless tobacco: Former Neurosurgeon    Types: Chew  . Tobacco comment: rarely uses smokeless tobacco  Vaping Use  . Vaping Use: Never used  Substance Use Topics  .  Alcohol use: Yes    Comment: maybe a 6 pack per week  . Drug use: No    No current facility-administered medications for this encounter.  Current Outpatient Medications:  .  aspirin EC 81 MG tablet, Take 81 mg by mouth daily., Disp: , Rfl:  .  atorvastatin (LIPITOR) 80 MG tablet, Take 80 mg by mouth at bedtime., Disp: , Rfl:  .  buPROPion (WELLBUTRIN XL) 300 MG 24 hr tablet, Take 300 mg by mouth daily., Disp: , Rfl:  .  clopidogrel (PLAVIX) 75 MG tablet, Take 75 mg by mouth daily., Disp: , Rfl:  .  cyclobenzaprine (FLEXERIL) 10 MG tablet, Take 1 tablet (10 mg total) by mouth 3 (three) times daily., Disp: 30 tablet, Rfl: 0 .  docusate sodium (COLACE) 50 MG capsule, Take 50 mg by mouth 2 (two) times daily., Disp: , Rfl:  .  escitalopram (LEXAPRO) 20 MG tablet, Take 20 mg by mouth daily., Disp: , Rfl:  .  folic acid (FOLVITE) 400 MCG tablet, Take 400 mcg by mouth daily., Disp: , Rfl:  .  gabapentin (NEURONTIN) 300 MG capsule, Take 900 mg by mouth 3 (three) times daily. , Disp: , Rfl:  .  lamoTRIgine (LAMICTAL) 25 MG tablet, Take 25 mg by mouth daily., Disp: , Rfl:  .  levothyroxine (SYNTHROID, LEVOTHROID) 25 MCG tablet, Take 25 mcg by mouth daily before breakfast. ,  Disp: , Rfl:  .  lisinopril (PRINIVIL,ZESTRIL) 20 MG tablet, Take 20 mg by mouth daily. , Disp: , Rfl:  .  montelukast (SINGULAIR) 10 MG tablet, Take 10 mg by mouth daily. , Disp: , Rfl:  .  niacin (NIASPAN) 500 MG CR tablet, Take 500 mg by mouth at bedtime., Disp: , Rfl:  .  QUEtiapine (SEROQUEL) 100 MG tablet, Take 100 mg by mouth at bedtime., Disp: , Rfl:  .  vitamin B-12 (CYANOCOBALAMIN) 1000 MCG tablet, Take 1,000 mcg by mouth daily. , Disp: , Rfl:   Allergies  Allergen Reactions  . Hydrochlorothiazide W-Triamterene Other (See Comments)    High blood pressure  . Metoprolol Other (See Comments)    Exacerbates MS, High blood pressure  . Penicillins Other (See Comments)    Sores in mouth Has patient had a PCN reaction  causing immediate rash, facial/tongue/throat swelling, SOB or lightheadedness with hypotension: No Has patient had a PCN reaction causing severe rash involving mucus membranes or skin necrosis: No Has patient had a PCN reaction that required hospitalization: No Has patient had a PCN reaction occurring within the last 10 years: No If all of the above answers are "NO", then may proceed with Cephalosporin use.      ROS  As noted in HPI.   Physical Exam  BP (!) 136/99 (BP Location: Right Arm)   Pulse (!) 105   Temp 99.2 F (37.3 C) (Oral)   Resp 18   SpO2 100%   Constitutional: Well developed, well nourished, no acute distress Eyes:  EOMI, conjunctiva normal bilaterally HENT: Normocephalic, atraumatic,mucus membranes moist Respiratory: Normal inspiratory effort Cardiovascular: Normal rate GI: nondistended skin: 2 cm clean linear laceration with bright red pulsatile bleeding left lateral forearm Musculoskeletal: Sensation to hand intact.  Patient able to flex wrist and all fingers against resistance. Neurologic: Alert & oriented x 3, no focal neuro deficits Psychiatric: Speech and behavior appropriate   ED Course   Medications - No data to display  No orders of the defined types were placed in this encounter.   No results found for this or any previous visit (from the past 24 hour(s)). No results found.  ED Clinical Impression  1. Laceration of left forearm, initial encounter      ED Assessment/Plan  Patient with a laceration to the left distal forearm.  Suspect arterial/arteriolar bleed.  RP 2+.  No evidence of tendon injury on brief exam.  Placed Steri-Strips and pressure dressing to stabilize the wound. Sending to the [facility] emergency department for evaluation.   Feel that patient is stable go by private vehicle.  No orders of the defined types were placed in this encounter.   *This clinic note was created using Dragon dictation software. Therefore, there  may be occasional mistakes despite careful proofreading.   ?    Domenick Gong, MD 12/08/19 1735

## 2019-12-08 NOTE — ED Triage Notes (Signed)
Patient with laceration to his left forearm that occurred 30 mins prior to arrival. Currently on Eliquis. Bleeding through bandages with a pulsating movement. I went and got Dr. Chaney Malling who helped with pulling off bandages. Patient deep laceration to his forearm potentially hit an artery. Applied Steri-Strips with Dr. Chaney Malling and a pressure dressing, advised to seek further treatment at [facility], asked to go straight there.

## 2019-12-13 ENCOUNTER — Encounter: Payer: Self-pay | Admitting: Emergency Medicine

## 2019-12-13 ENCOUNTER — Other Ambulatory Visit: Payer: Self-pay

## 2019-12-13 ENCOUNTER — Ambulatory Visit
Admission: EM | Admit: 2019-12-13 | Discharge: 2019-12-13 | Disposition: A | Discharge status: Home / Self Care | Payer: Medicare Other | Attending: Family Medicine | Admitting: Family Medicine

## 2019-12-13 DIAGNOSIS — Z4802 Encounter for removal of sutures: Secondary | ICD-10-CM | POA: Diagnosis not present

## 2019-12-13 NOTE — ED Provider Notes (Signed)
[facility] ____________________________________________  Time seen: Approximately 12:49 PM  I have reviewed the triage vital signs and the nursing notes.   HISTORY  Chief Complaint Suture / Staple Removal   HPI Cody Burns is a 54 y.o. male present prior suture removal to the left forearm.  Patient had 3 sutures placed at [facility] emergency room 5 days ago.  Patient reports area is healing well and doing much better.  Denies any drainage, fevers or pain.  States his bruising is healing and doing much better as well.  Denies other complaints.  Past Medical History:  Diagnosis Date   Blackout 2018   several episodes with unknown etiology. cause of his ankle break. awaiting neuro workup   Hypertension    Hypothyroidism    Multiple sclerosis (HCC) 1990   Myocardial infarct (HCC)    Optic neuritis    x 3. takes steriods and it resolves.   Seizures (HCC)    as a child. diagnosed with MS and once treated, seizures resolved.   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   Hx of ischemic left MCA stroke 11/13/2016   Hypertension, essential 11/13/2016   Multiple sclerosis, relapsing-remitting (HCC) 11/13/2016   Carotid occlusion, left 11/13/2016   Hyperlipidemia LDL goal <70 11/13/2016   Slurred speech 11/13/2016   Closed trimalleolar fracture of right ankle 09/25/2016   Closed right ankle fracture 09/22/2016    Past Surgical History:  Procedure Laterality Date   CORONARY ARTERY BYPASS GRAFT  2015   CORONARY STENT INTERVENTION N/A 06/07/2017   Procedure: CORONARY STENT INTERVENTION;   Surgeon: Alwyn Pea, MD;  Location: [facility];  Service: Cardiovascular;  Laterality: N/A;   JOINT REPLACEMENT Left 2007   left hip replacement   LEFT HEART CATH AND CORS/GRAFTS ANGIOGRAPHY N/A 06/07/2017   Procedure: LEFT HEART CATH AND CORS/GRAFTS ANGIOGRAPHY;  Surgeon: Lamar Blinks, MD;  Location: [facility];  Service: Cardiovascular;  Laterality: N/A;   ORIF ANKLE FRACTURE Right 09/22/2016   Procedure: OPEN REDUCTION INTERNAL FIXATION (ORIF) ANKLE FRACTURE;  Surgeon: Christena Flake, MD;  Location: [facility] ORS;  Service: Orthopedics;  Laterality: Right;   quadruple cardiac bypass  2015     No current facility-administered medications for this encounter.  Current Outpatient Medications:    aspirin EC 81 MG tablet, Take 81 mg by mouth daily., Disp: , Rfl:    atorvastatin (LIPITOR) 80 MG tablet, Take 80 mg by mouth at bedtime., Disp: , Rfl:    buPROPion (WELLBUTRIN XL) 300 MG 24 hr tablet, Take 300 mg by mouth daily., Disp: , Rfl:    clopidogrel (PLAVIX) 75 MG tablet, Take 75 mg by mouth daily., Disp: , Rfl:    cyclobenzaprine (FLEXERIL) 10 MG tablet, Take 1 tablet (10 mg total) by mouth 3 (three) times daily., Disp: 30 tablet, Rfl: 0   docusate sodium (COLACE) 50 MG capsule, Take 50 mg by mouth 2 (two) times daily., Disp: , Rfl:    escitalopram (LEXAPRO) 20 MG tablet, Take 20 mg by mouth daily., Disp: , Rfl:    folic acid (FOLVITE) 400 MCG tablet, Take 400  mcg by mouth daily., Disp: , Rfl:    gabapentin (NEURONTIN) 300 MG capsule, Take 900 mg by mouth 3 (three) times daily. , Disp: , Rfl:    lamoTRIgine (LAMICTAL) 25 MG tablet, Take 25 mg by mouth daily., Disp: , Rfl:    levothyroxine (SYNTHROID, LEVOTHROID) 25 MCG tablet, Take 25 mcg by mouth daily before breakfast. , Disp: , Rfl:    lisinopril (PRINIVIL,ZESTRIL) 20 MG tablet, Take 20 mg by mouth daily. , Disp: , Rfl:    montelukast (SINGULAIR) 10 MG tablet, Take 10 mg by mouth daily. , Disp: ,  Rfl:    QUEtiapine (SEROQUEL) 100 MG tablet, Take 100 mg by mouth at bedtime., Disp: , Rfl:    vitamin B-12 (CYANOCOBALAMIN) 1000 MCG tablet, Take 1,000 mcg by mouth daily. , Disp: , Rfl:    niacin (NIASPAN) 500 MG CR tablet, Take 500 mg by mouth at bedtime., Disp: , Rfl:   Allergies Hydrochlorothiazide w-triamterene, Metoprolol, and Penicillins  Family History  Problem Relation Age of Onset   Cancer Mother    Other Father     Social History Social History   Tobacco Use   Smoking status: Never Smoker   Smokeless tobacco: Former Neurosurgeon    Types: Chew   Tobacco comment: rarely uses smokeless tobacco  Vaping Use   Vaping Use: Never used  Substance Use Topics   Alcohol use: Yes    Comment: maybe a 6 pack per week   Drug use: No    Review of Systems Constitutional: No fever/chills Skin: Left forearm sutures.  ____________________________________________   PHYSICAL EXAM:  VITAL SIGNS: ED Triage Vitals  Enc Vitals Group     BP 12/13/19 1241 117/82     Pulse Rate 12/13/19 1241 76     Resp 12/13/19 1241 18     Temp 12/13/19 1241 98 F (36.7 C)     Temp Source 12/13/19 1241 Oral     SpO2 12/13/19 1241 97 %     Weight 12/13/19 1239 229 lb 15 oz (104.3 kg)     Height 12/13/19 1239 5\' 8"  (1.727 m)     Head Circumference --      Peak Flow --      Pain Score 12/13/19 1239 0     Pain Loc --      Pain Edu? --      Excl. in GC? --     Constitutional: Alert and oriented. Well appearing and in no acute distress. ENT      Head: Normocephalic and atraumatic. Cardiovascular: Good peripheral circulation. Musculoskeletal: Steady gait. Skin:  Skin is warm, dry.  Except: Left forearm 3 sutures present well approximated, no erythema, no drainage, nontender, mild medial ecchymosis improving, bilateral distal radial pulses equal. Psychiatric: Mood and affect are normal. Speech and behavior are normal. Patient exhibits appropriate insight and judgment    ___________________________________________   LABS (all labs ordered are listed, but only abnormal results are displayed)  Labs Reviewed - No data to display   PROCEDURES Procedures    INITIAL IMPRESSION / ASSESSMENT AND PLAN / ED COURSE  Pertinent labs & imaging results that were available during my care of the patient were reviewed by me and considered in my medical decision making (see chart for details).  Well-appearing patient.  Left forearm laceration appears to be healing well.  Sutures removed by nursing staff.  Supportive care.  Discussed follow up with Primary care physician this week as needed. Discussed follow up and return parameters  including no resolution or any worsening concerns. Patient verbalized understanding and agreed to plan.   ____________________________________________   FINAL CLINICAL IMPRESSION(S) / ED DIAGNOSES  Final diagnoses:  Visit for suture removal     ED Discharge Orders    None       Note: This dictation was prepared with Dragon dictation along with smaller phrase technology. Any transcriptional errors that result from this process are unintentional.         Renford Dills, NP 12/13/19 1311

## 2019-12-13 NOTE — ED Triage Notes (Signed)
Patient here for suture removal in his left arm. Had 3 sutures placed 5 days ago at [facility].

## 2020-03-06 DIAGNOSIS — S02841A Fracture of lateral orbital wall, right side, initial encounter for closed fracture: Secondary | ICD-10-CM | POA: Diagnosis not present

## 2020-03-06 DIAGNOSIS — M25551 Pain in right hip: Secondary | ICD-10-CM | POA: Diagnosis not present

## 2020-03-06 DIAGNOSIS — R0789 Other chest pain: Secondary | ICD-10-CM | POA: Diagnosis not present

## 2020-03-06 DIAGNOSIS — S0240EA Zygomatic fracture, right side, initial encounter for closed fracture: Secondary | ICD-10-CM | POA: Diagnosis not present

## 2020-03-06 DIAGNOSIS — W06XXXA Fall from bed, initial encounter: Secondary | ICD-10-CM | POA: Diagnosis not present

## 2020-03-06 DIAGNOSIS — M6281 Muscle weakness (generalized): Secondary | ICD-10-CM | POA: Diagnosis not present

## 2020-03-06 DIAGNOSIS — D62 Acute posthemorrhagic anemia: Secondary | ICD-10-CM | POA: Diagnosis not present

## 2020-03-06 DIAGNOSIS — H05239 Hemorrhage of unspecified orbit: Secondary | ICD-10-CM | POA: Diagnosis not present

## 2020-03-06 DIAGNOSIS — G8911 Acute pain due to trauma: Secondary | ICD-10-CM | POA: Diagnosis not present

## 2020-03-06 DIAGNOSIS — R58 Hemorrhage, not elsewhere classified: Secondary | ICD-10-CM | POA: Diagnosis not present

## 2020-03-06 DIAGNOSIS — E785 Hyperlipidemia, unspecified: Secondary | ICD-10-CM | POA: Diagnosis not present

## 2020-03-06 DIAGNOSIS — R41841 Cognitive communication deficit: Secondary | ICD-10-CM | POA: Diagnosis not present

## 2020-03-06 DIAGNOSIS — Y9289 Other specified places as the place of occurrence of the external cause: Secondary | ICD-10-CM | POA: Diagnosis not present

## 2020-03-06 DIAGNOSIS — S0240ED Zygomatic fracture, right side, subsequent encounter for fracture with routine healing: Secondary | ICD-10-CM | POA: Diagnosis not present

## 2020-03-06 DIAGNOSIS — E039 Hypothyroidism, unspecified: Secondary | ICD-10-CM | POA: Diagnosis not present

## 2020-03-06 DIAGNOSIS — S199XXA Unspecified injury of neck, initial encounter: Secondary | ICD-10-CM | POA: Diagnosis not present

## 2020-03-06 DIAGNOSIS — I6782 Cerebral ischemia: Secondary | ICD-10-CM | POA: Diagnosis not present

## 2020-03-06 DIAGNOSIS — F419 Anxiety disorder, unspecified: Secondary | ICD-10-CM | POA: Diagnosis not present

## 2020-03-06 DIAGNOSIS — F32A Depression, unspecified: Secondary | ICD-10-CM | POA: Diagnosis not present

## 2020-03-06 DIAGNOSIS — M79661 Pain in right lower leg: Secondary | ICD-10-CM | POA: Diagnosis not present

## 2020-03-06 DIAGNOSIS — S0231XA Fracture of orbital floor, right side, initial encounter for closed fracture: Secondary | ICD-10-CM | POA: Diagnosis not present

## 2020-03-06 DIAGNOSIS — W01190A Fall on same level from slipping, tripping and stumbling with subsequent striking against furniture, initial encounter: Secondary | ICD-10-CM | POA: Diagnosis not present

## 2020-03-06 DIAGNOSIS — Z9889 Other specified postprocedural states: Secondary | ICD-10-CM | POA: Diagnosis not present

## 2020-03-06 DIAGNOSIS — S0511XA Contusion of eyeball and orbital tissues, right eye, initial encounter: Secondary | ICD-10-CM | POA: Diagnosis not present

## 2020-03-06 DIAGNOSIS — N178 Other acute kidney failure: Secondary | ICD-10-CM | POA: Diagnosis not present

## 2020-03-06 DIAGNOSIS — I6522 Occlusion and stenosis of left carotid artery: Secondary | ICD-10-CM | POA: Diagnosis not present

## 2020-03-06 DIAGNOSIS — S299XXA Unspecified injury of thorax, initial encounter: Secondary | ICD-10-CM | POA: Diagnosis not present

## 2020-03-06 DIAGNOSIS — R489 Unspecified symbolic dysfunctions: Secondary | ICD-10-CM | POA: Diagnosis not present

## 2020-03-06 DIAGNOSIS — H0520 Unspecified exophthalmos: Secondary | ICD-10-CM | POA: Diagnosis not present

## 2020-03-06 DIAGNOSIS — S022XXA Fracture of nasal bones, initial encounter for closed fracture: Secondary | ICD-10-CM | POA: Diagnosis not present

## 2020-03-06 DIAGNOSIS — I6521 Occlusion and stenosis of right carotid artery: Secondary | ICD-10-CM | POA: Diagnosis not present

## 2020-03-06 DIAGNOSIS — K219 Gastro-esophageal reflux disease without esophagitis: Secondary | ICD-10-CM | POA: Diagnosis not present

## 2020-03-06 DIAGNOSIS — R402362 Coma scale, best motor response, obeys commands, at arrival to emergency department: Secondary | ICD-10-CM | POA: Diagnosis not present

## 2020-03-06 DIAGNOSIS — I6523 Occlusion and stenosis of bilateral carotid arteries: Secondary | ICD-10-CM | POA: Diagnosis not present

## 2020-03-06 DIAGNOSIS — R402142 Coma scale, eyes open, spontaneous, at arrival to emergency department: Secondary | ICD-10-CM | POA: Diagnosis not present

## 2020-03-06 DIAGNOSIS — H05231 Hemorrhage of right orbit: Secondary | ICD-10-CM | POA: Diagnosis not present

## 2020-03-06 DIAGNOSIS — S0240CA Maxillary fracture, right side, initial encounter for closed fracture: Secondary | ICD-10-CM | POA: Diagnosis not present

## 2020-03-06 DIAGNOSIS — I6501 Occlusion and stenosis of right vertebral artery: Secondary | ICD-10-CM | POA: Diagnosis not present

## 2020-03-06 DIAGNOSIS — S20211A Contusion of right front wall of thorax, initial encounter: Secondary | ICD-10-CM | POA: Diagnosis not present

## 2020-03-06 DIAGNOSIS — I10 Essential (primary) hypertension: Secondary | ICD-10-CM | POA: Diagnosis not present

## 2020-03-06 DIAGNOSIS — N179 Acute kidney failure, unspecified: Secondary | ICD-10-CM | POA: Diagnosis not present

## 2020-03-06 DIAGNOSIS — S022XXD Fracture of nasal bones, subsequent encounter for fracture with routine healing: Secondary | ICD-10-CM | POA: Diagnosis not present

## 2020-03-06 DIAGNOSIS — M25552 Pain in left hip: Secondary | ICD-10-CM | POA: Diagnosis not present

## 2020-03-06 DIAGNOSIS — W19XXXA Unspecified fall, initial encounter: Secondary | ICD-10-CM | POA: Diagnosis not present

## 2020-03-06 DIAGNOSIS — R402252 Coma scale, best verbal response, oriented, at arrival to emergency department: Secondary | ICD-10-CM | POA: Diagnosis not present

## 2020-03-06 DIAGNOSIS — J302 Other seasonal allergic rhinitis: Secondary | ICD-10-CM | POA: Diagnosis not present

## 2020-03-06 DIAGNOSIS — Z20822 Contact with and (suspected) exposure to covid-19: Secondary | ICD-10-CM | POA: Diagnosis not present

## 2020-03-06 DIAGNOSIS — G35 Multiple sclerosis: Secondary | ICD-10-CM | POA: Diagnosis not present

## 2020-03-12 DIAGNOSIS — Z03818 Encounter for observation for suspected exposure to other biological agents ruled out: Secondary | ICD-10-CM | POA: Diagnosis not present

## 2020-03-12 DIAGNOSIS — M62838 Other muscle spasm: Secondary | ICD-10-CM | POA: Diagnosis not present

## 2020-03-12 DIAGNOSIS — M6281 Muscle weakness (generalized): Secondary | ICD-10-CM | POA: Diagnosis not present

## 2020-03-12 DIAGNOSIS — I6990 Unspecified sequelae of unspecified cerebrovascular disease: Secondary | ICD-10-CM | POA: Diagnosis not present

## 2020-03-12 DIAGNOSIS — W19XXXA Unspecified fall, initial encounter: Secondary | ICD-10-CM | POA: Diagnosis not present

## 2020-03-12 DIAGNOSIS — G894 Chronic pain syndrome: Secondary | ICD-10-CM | POA: Diagnosis not present

## 2020-03-12 DIAGNOSIS — S022XXD Fracture of nasal bones, subsequent encounter for fracture with routine healing: Secondary | ICD-10-CM | POA: Diagnosis not present

## 2020-03-12 DIAGNOSIS — S0240ED Zygomatic fracture, right side, subsequent encounter for fracture with routine healing: Secondary | ICD-10-CM | POA: Diagnosis not present

## 2020-03-12 DIAGNOSIS — H05231 Hemorrhage of right orbit: Secondary | ICD-10-CM | POA: Diagnosis not present

## 2020-03-12 DIAGNOSIS — R296 Repeated falls: Secondary | ICD-10-CM | POA: Diagnosis not present

## 2020-03-12 DIAGNOSIS — R41841 Cognitive communication deficit: Secondary | ICD-10-CM | POA: Diagnosis not present

## 2020-03-12 DIAGNOSIS — G35 Multiple sclerosis: Secondary | ICD-10-CM | POA: Diagnosis not present

## 2020-03-25 DIAGNOSIS — Z03818 Encounter for observation for suspected exposure to other biological agents ruled out: Secondary | ICD-10-CM | POA: Diagnosis not present

## 2020-04-04 DIAGNOSIS — G894 Chronic pain syndrome: Secondary | ICD-10-CM | POA: Diagnosis not present

## 2020-04-04 DIAGNOSIS — G35 Multiple sclerosis: Secondary | ICD-10-CM | POA: Diagnosis not present

## 2020-04-04 DIAGNOSIS — R296 Repeated falls: Secondary | ICD-10-CM | POA: Diagnosis not present

## 2020-04-04 DIAGNOSIS — I6990 Unspecified sequelae of unspecified cerebrovascular disease: Secondary | ICD-10-CM | POA: Diagnosis not present

## 2020-04-04 DIAGNOSIS — M62838 Other muscle spasm: Secondary | ICD-10-CM | POA: Diagnosis not present

## 2020-04-13 DIAGNOSIS — I639 Cerebral infarction, unspecified: Secondary | ICD-10-CM | POA: Diagnosis not present

## 2020-05-13 DIAGNOSIS — I639 Cerebral infarction, unspecified: Secondary | ICD-10-CM | POA: Diagnosis not present

## 2021-02-12 ENCOUNTER — Ambulatory Visit
Admission: EM | Admit: 2021-02-12 | Discharge: 2021-02-12 | Disposition: A | Discharge status: Home / Self Care | Payer: Medicare Other | Attending: Family Medicine | Admitting: Family Medicine

## 2021-02-12 ENCOUNTER — Other Ambulatory Visit: Payer: Self-pay

## 2021-02-12 ENCOUNTER — Ambulatory Visit (INDEPENDENT_AMBULATORY_CARE_PROVIDER_SITE_OTHER): Payer: Medicare Other

## 2021-02-12 DIAGNOSIS — M79672 Pain in left foot: Secondary | ICD-10-CM | POA: Diagnosis not present

## 2021-02-12 DIAGNOSIS — S99922A Unspecified injury of left foot, initial encounter: Secondary | ICD-10-CM | POA: Diagnosis not present

## 2021-02-12 MED ORDER — MUPIROCIN 2 % EX OINT: 1.0000 "application " | TOPICAL_OINTMENT | Freq: Two times a day (BID) | CUTANEOUS | 0 refills | Status: AC

## 2021-02-12 NOTE — Discharge Instructions (Signed)
Xray looked good.  No evidence of foreign body.  Keep the area clean.  Medication as prescribed.  Take care  Dr. Adriana Simas

## 2021-02-12 NOTE — ED Triage Notes (Signed)
Pt states he has MS and foot drop and fell getting out of the shower 2 days ago. Left foot pain to plantar region and unsure what he stepped on.

## 2021-02-14 NOTE — ED Provider Notes (Signed)
[facility]    CSN: [id number] Arrival date & time: 02/12/21  1059      History   Chief Complaint Chief Complaint  Patient presents with   Foot Pain    HPI  55 year old male presents with the above complaint.  Patient states that he fell getting out of the shower 2 days ago.  He somehow stepped on something and injured his left foot.  He is unsure what he stepped on.  He has a small wound to the left foot.  He is concerned about the possibility of foreign body.  His pain is 8/10 in severity.  No relieving factors.  No other complaints.  Past Medical History:  Diagnosis Date   Blackout 2018   several episodes with unknown etiology. cause of his ankle break. awaiting neuro workup   Hypertension    Hypothyroidism    Multiple sclerosis (HCC) 1990   Myocardial infarct (HCC)    Optic neuritis    x 3. takes steriods and it resolves.   Seizures (HCC)    as a child. diagnosed with MS and once treated, seizures resolved.   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   Hx of ischemic left MCA stroke 11/13/2016   Hypertension, essential 11/13/2016   Multiple sclerosis, relapsing-remitting (HCC) 11/13/2016   Carotid occlusion, left 11/13/2016   Hyperlipidemia LDL goal <70 11/13/2016   Slurred speech 11/13/2016   Closed trimalleolar fracture of right ankle 09/25/2016   Closed right ankle fracture 09/22/2016    Past Surgical History:  Procedure Laterality Date   CORONARY ARTERY BYPASS GRAFT  2015   CORONARY STENT INTERVENTION N/A 06/07/2017   Procedure: CORONARY STENT INTERVENTION;  Surgeon: Alwyn Pea,  MD;  Location: [facility];  Service: Cardiovascular;  Laterality: N/A;   JOINT REPLACEMENT Left 2007   left hip replacement   LEFT HEART CATH AND CORS/GRAFTS ANGIOGRAPHY N/A 06/07/2017   Procedure: LEFT HEART CATH AND CORS/GRAFTS ANGIOGRAPHY;  Surgeon: Lamar Blinks, MD;  Location: [facility];  Service: Cardiovascular;  Laterality: N/A;   ORIF ANKLE FRACTURE Right 09/22/2016   Procedure: OPEN REDUCTION INTERNAL FIXATION (ORIF) ANKLE FRACTURE;  Surgeon: Christena Flake, MD;  Location: [facility] ORS;  Service: Orthopedics;  Laterality: Right;   quadruple cardiac bypass  2015       Home Medications    Prior to Admission medications   Medication Sig Start Date End Date Taking? Authorizing Provider  mupirocin ointment (BACTROBAN) 2 % Apply 1 application topically 2 (two) times daily for 7 days. 02/12/21 02/19/21 Yes Kita Neace G, DO  OXcarbazepine (TRILEPTAL) 150 MG tablet Take 1 tablet by mouth 2 (two) times daily. 02/23/20  Yes [provider]  aspirin EC 81 MG tablet Take 81 mg by mouth daily.    [provider]  atorvastatin (LIPITOR) 80 MG tablet Take 80 mg by mouth at bedtime.    [provider]  buPROPion (WELLBUTRIN XL) 300 MG 24 hr tablet Take 300 mg by mouth daily.    [provider]  clopidogrel (PLAVIX) 75 MG tablet Take 75 mg by mouth daily.  [provider]  cyclobenzaprine (FLEXERIL) 10 MG tablet Take 1 tablet (10 mg total) by mouth 3 (three) times daily. 06/11/17   Shaune Pollack, MD  docusate sodium (COLACE) 50 MG capsule Take 50 mg by mouth 2 (two) times daily.    [provider]  escitalopram (LEXAPRO) 20 MG tablet Take 20 mg by mouth daily.    [provider]  folic acid (FOLVITE) 400 MCG tablet Take 400 mcg by mouth daily.    [provider]  gabapentin (NEURONTIN) 300 MG capsule Take 900 mg by mouth 3 (three) times daily.     [provider]  lamoTRIgine (LAMICTAL) 25 MG tablet Take  25 mg by mouth daily.    [provider]  levothyroxine (SYNTHROID, LEVOTHROID) 25 MCG tablet Take 25 mcg by mouth daily before breakfast.     [provider]  lisinopril (PRINIVIL,ZESTRIL) 20 MG tablet Take 20 mg by mouth daily.     [provider]  montelukast (SINGULAIR) 10 MG tablet Take 10 mg by mouth daily.     [provider]  niacin (NIASPAN) 500 MG CR tablet Take 500 mg by mouth at bedtime. 11/24/16 12/28/17  [provider]  QUEtiapine (SEROQUEL) 100 MG tablet Take 100 mg by mouth at bedtime.    [provider]  vitamin B-12 (CYANOCOBALAMIN) 1000 MCG tablet Take 1,000 mcg by mouth daily.  07/09/09   [provider]    Family History Family History  Problem Relation Age of Onset   Cancer Mother    Other Father     Social History Social History   Tobacco Use   Smoking status: Never   Smokeless tobacco: Former    Types: Chew   Tobacco comments:    rarely uses smokeless tobacco  Vaping Use   Vaping Use: Never used  Substance Use Topics   Alcohol use: Not Currently    Comment: maybe a 6 pack per week   Drug use: No     Allergies   Hydrochlorothiazide w-triamterene, Metoprolol, and Penicillins   Review of Systems Review of Systems Per HPI  Physical Exam Triage Vital Signs ED Triage Vitals  Enc Vitals Group     BP 02/12/21 1136 (!) 147/94     Pulse Rate 02/12/21 1136 76     Resp 02/12/21 1136 18     Temp 02/12/21 1136 98 F (36.7 C)     Temp Source 02/12/21 1136 Oral     SpO2 02/12/21 1136 100 %     Weight 02/12/21 1135 195 lb (88.5 kg)     Height 02/12/21 1135 6' (1.829 m)     Head Circumference --      Peak Flow --      Pain Score 02/12/21 1135 8     Pain Loc --      Pain Edu? --      Excl. in GC? --    Updated Vital Signs BP (!) 147/94 (BP Location: Left Arm)   Pulse 76   Temp 98 F (36.7 C) (Oral)   Resp 18   Ht 6' (1.829 m)   Wt 88.5 kg   SpO2 100%   BMI 26.45 kg/m   Visual  Acuity Right Eye Distance:   Left Eye Distance:   Bilateral Distance:    Right Eye Near:   Left Eye Near:    Bilateral Near:     Physical Exam Vitals and nursing note reviewed.  Constitutional:  General: He is not in acute distress.    Appearance: Normal appearance. He is not ill-appearing.  HENT:     Head: Normocephalic and atraumatic.  Eyes:     General:        Right eye: No discharge.        Left eye: No discharge.     Conjunctiva/sclera: Conjunctivae normal.  Pulmonary:     Effort: Pulmonary effort is normal. No respiratory distress.  Musculoskeletal:     Comments: Left foot - small wound noted on the plantar aspect at the 5th MTP joint.   Neurological:     Mental Status: He is alert.  Psychiatric:        Mood and Affect: Mood normal.        Behavior: Behavior normal.     UC Treatments / Results  Labs (all labs ordered are listed, but only abnormal results are displayed) Labs Reviewed - No data to display  EKG   Radiology DG Foot Complete Left  Result Date: 02/12/2021 CLINICAL DATA:  Left foot pain, foot drop, sore under fifth MTP joint EXAM: LEFT FOOT - COMPLETE 3+ VIEW COMPARISON:  None. FINDINGS: No acute fracture or dislocation. Mild osteoarthritis of the first MTP joint with subtle contour irregularity of the first metatarsal head likely degenerative. Small bidirectional calcaneal enthesophytes. Mild soft tissue prominence adjacent to the fifth MTP joint. No erosion or periosteal elevation. No definite soft tissue gas. IMPRESSION: 1. No acute osseous abnormality of the left foot. 2. Mild soft tissue prominence adjacent to the fifth MTP joint. No radiographic evidence of osteomyelitis. 3. Mild osteoarthritis of the first MTP joint. Electronically Signed   By: Duanne Guess D.O.   On: 02/12/2021 12:35    Procedures Procedures (including critical care time)  Medications Ordered in UC Medications - No data to display  Initial Impression / Assessment and  Plan / UC Course  I have reviewed the triage vital signs and the nursing notes.  Pertinent labs & imaging results that were available during my care of the patient were reviewed by me and considered in my medical decision making (see chart for details).    55 year old male presents with an injury to his left foot.  He has a small wound on the plantar aspect.  There is no evidence of infection.  X-ray was obtained and was independently reviewed by me.  Interpretation: No acute abnormalities.  There is soft tissue swelling around the fifth MTP joint.  Advised to keep his wound clean.  Bactroban ointment as prescribed.  Supportive care.  Final Clinical Impressions(s) / UC Diagnoses   Final diagnoses:  Injury of left foot, initial encounter     Discharge Instructions      Xray looked good.  No evidence of foreign body.  Keep the area clean.  Medication as prescribed.  Take care  Dr. Adriana Simas    ED Prescriptions     Medication Sig Dispense Auth. Provider   mupirocin ointment (BACTROBAN) 2 % Apply 1 application topically 2 (two) times daily for 7 days. 30 g Tommie Sams, DO      PDMP not reviewed this encounter.   Everlene Other [place], [place] 02/14/21 [phone]

## 2022-09-23 ENCOUNTER — Ambulatory Visit (INDEPENDENT_AMBULATORY_CARE_PROVIDER_SITE_OTHER): Payer: Non-veteran care

## 2022-09-23 ENCOUNTER — Ambulatory Visit
Admission: EM | Admit: 2022-09-23 | Discharge: 2022-09-23 | Disposition: A | Discharge status: Home / Self Care | Payer: Non-veteran care | Attending: Physician Assistant | Admitting: Physician Assistant

## 2022-09-23 DIAGNOSIS — M25552 Pain in left hip: Secondary | ICD-10-CM

## 2022-09-23 DIAGNOSIS — M5442 Lumbago with sciatica, left side: Secondary | ICD-10-CM

## 2022-09-23 DIAGNOSIS — Z043 Encounter for examination and observation following other accident: Secondary | ICD-10-CM | POA: Diagnosis not present

## 2022-09-23 DIAGNOSIS — G35 Multiple sclerosis: Secondary | ICD-10-CM

## 2022-09-23 DIAGNOSIS — Z96642 Presence of left artificial hip joint: Secondary | ICD-10-CM

## 2022-09-23 MED ORDER — OXYCODONE HCL 5 MG PO TABS: 5.0000 mg | ORAL_TABLET | Freq: Four times a day (QID) | ORAL | 0 refills | Status: AC | PRN

## 2022-09-23 NOTE — ED Triage Notes (Addendum)
Pt states he had a fall last Friday in the bathtub, pt states he hit LT lower back and LT hip. Pt states he had a hip replacement in this hip x4 years ago. Pt states he is using his walker to get around.

## 2022-09-23 NOTE — Discharge Instructions (Addendum)
-  X-rays do not show any acute abnormality but as we discussed x-rays cannot see discs, nerves and soft tissues. -Continue Lyrica for pain/neuropathy -Continue at home muscle relaxer as needed for spasms -Take pain meds if needed. -Follow up with PCP or ortho. May need MRI - Go to ER for any acute worsening of your pain, falls, increased weakness in your legs or increased numbness or if you have loss of bowel or bladder control.

## 2022-09-24 NOTE — ED Provider Notes (Signed)
[facility]    CSN: [id number] Arrival date & time: 09/23/22  1834      History   Chief Complaint Chief Complaint  Patient presents with   Back Pain   Hip Pain    LT hip    HPI Ell Tiso is a 57 y.o. male with history of multiple sclerosis and, hypertension, hypothyroidism, previous MI and sleep apnea.  Patient presents today for evaluation of left lower back pain for the past couple of weeks.  He says that he fell in his bathtub and has been having pain ever since.  He reports that he initially went to the [facility] emergency department and had some imaging performed.  States that he was told the imaging was fine and he probably had some sort of pinched nerve.  He reports he was referred to physical therapy.  States he really wanted to attend physical therapy and recently tried to go.  He says that everything became a lot more flared up after PT and the physical therapist recommended that he be seen and evaluated again regarding his hip discomfort before proceeding with physical therapy.  Patient reports most pain is of the left lower back, left buttocks and left posterior upper leg.  He says sometimes he will have numbness and tingling in his feet but he does have a history of neuropathy.  He takes Lyrica 3 times daily for this and says it is really helpful.  He also takes a muscle relaxer 3 times a day for frequent muscle spasms.  He reports increased muscle spasms in the left leg when he straightens it out.  Has not noticed any increased weakness.  He does use a walker.  He says he does not have any history of chronic back pain or problems.  History of left hip replacement.  He has no other complaints or concerns.  HPI  Past Medical History:  Diagnosis Date   Blackout 2018   several episodes with unknown etiology. cause of his ankle break. awaiting neuro workup   Hypertension    Hypothyroidism    Multiple sclerosis (HCC) 1990   Myocardial infarct (HCC)    Optic neuritis     x 3. takes steriods and it resolves.   Seizures (HCC)    as a child. diagnosed with MS and once treated, seizures resolved.   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   Hx of ischemic left MCA stroke 11/13/2016   Hypertension, essential 11/13/2016   Multiple sclerosis, relapsing-remitting (HCC) 11/13/2016   Carotid occlusion, left 11/13/2016   Hyperlipidemia LDL goal <70 11/13/2016   Slurred speech 11/13/2016   Closed trimalleolar fracture of right ankle 09/25/2016   Closed right ankle fracture 09/22/2016    Past Surgical History:  Procedure Laterality Date   CORONARY ARTERY BYPASS GRAFT  2015   CORONARY STENT INTERVENTION N/A 06/07/2017   Procedure: CORONARY STENT INTERVENTION;  Surgeon: Alwyn Pea, MD;  Location: [facility] INVASIVE CV LAB;  Service: Cardiovascular;  Laterality: N/A;   JOINT REPLACEMENT Left 2007   left hip replacement   LEFT HEART CATH AND CORS/GRAFTS ANGIOGRAPHY N/A 06/07/2017  Procedure: LEFT HEART CATH AND CORS/GRAFTS ANGIOGRAPHY;  Surgeon: Lamar Blinks, MD;  Location: [facility] INVASIVE CV LAB;  Service: Cardiovascular;  Laterality: N/A;   ORIF ANKLE FRACTURE Right 09/22/2016   Procedure: OPEN REDUCTION INTERNAL FIXATION (ORIF) ANKLE FRACTURE;  Surgeon: Christena Flake, MD;  Location: [facility] ORS;  Service: Orthopedics;  Laterality: Right;   quadruple cardiac bypass  2015       Home Medications    Prior to Admission medications   Medication Sig Start Date End Date Taking? Authorizing Provider  oxyCODONE (ROXICODONE) 5 MG immediate release tablet Take 1 tablet (5 mg total) by mouth every 6 (six) hours as  needed for up to 3 days for severe pain. 09/23/22 09/26/22 Yes Shirlee Latch, PA-C  aspirin EC 81 MG tablet Take 81 mg by mouth daily.    [provider]  atorvastatin (LIPITOR) 80 MG tablet Take 80 mg by mouth at bedtime.    [provider]  buPROPion (WELLBUTRIN XL) 300 MG 24 hr tablet Take 300 mg by mouth daily.    [provider]  clopidogrel (PLAVIX) 75 MG tablet Take 75 mg by mouth daily.    [provider]  cyclobenzaprine (FLEXERIL) 10 MG tablet Take 1 tablet (10 mg total) by mouth 3 (three) times daily. 06/11/17   Shaune Pollack, MD  docusate sodium (COLACE) 50 MG capsule Take 50 mg by mouth 2 (two) times daily.    [provider]  escitalopram (LEXAPRO) 20 MG tablet Take 20 mg by mouth daily.    [provider]  folic acid (FOLVITE) 400 MCG tablet Take 400 mcg by mouth daily.    [provider]  gabapentin (NEURONTIN) 300 MG capsule Take 900 mg by mouth 3 (three) times daily.     [provider]  lamoTRIgine (LAMICTAL) 25 MG tablet Take 25 mg by mouth daily.    [provider]  levothyroxine (SYNTHROID, LEVOTHROID) 25 MCG tablet Take 25 mcg by mouth daily before breakfast.     [provider]  lisinopril (PRINIVIL,ZESTRIL) 20 MG tablet Take 20 mg by mouth daily.     [provider]  montelukast (SINGULAIR) 10 MG tablet Take 10 mg by mouth daily.     [provider]  niacin (NIASPAN) 500 MG CR tablet Take 500 mg by mouth at bedtime. 11/24/16 12/28/17  [provider]  OXcarbazepine (TRILEPTAL) 150 MG tablet Take 1 tablet by mouth 2 (two) times daily. 02/23/20   [provider]  QUEtiapine (SEROQUEL) 100 MG tablet Take 100 mg by mouth at bedtime.    [provider]  vitamin B-12 (CYANOCOBALAMIN) 1000 MCG tablet Take 1,000 mcg by mouth daily.  07/09/09   [provider]    Family History Family History  Problem Relation Age of Onset   Cancer Mother    Other  Father     Social History Social History   Tobacco Use   Smoking status: Never   Smokeless tobacco: Former    Types: Chew   Tobacco comments:    rarely uses smokeless tobacco  Vaping Use   Vaping Use: Never used  Substance Use Topics   Alcohol use: Not Currently    Comment: maybe a 6 pack per week   Drug use: No     Allergies   Hydrochlorothiazide w-triamterene, Metoprolol, Penicillins, and Aripiprazole   Review of Systems Review of Systems  Musculoskeletal:  Positive for arthralgias, back pain and gait problem. Negative for joint swelling and  myalgias.  Neurological:  Positive for numbness. Negative for weakness.     Physical Exam Triage Vital Signs ED Triage Vitals  Enc Vitals Group     BP 09/23/22 1858 131/74     Pulse Rate 09/23/22 1858 95     Resp --      Temp 09/23/22 1858 98.2 F (36.8 C)     Temp Source 09/23/22 1858 Oral     SpO2 09/23/22 1858 97 %     Weight 09/23/22 1857 240 lb (108.9 kg)     Height 09/23/22 1857 6' (1.829 m)     Head Circumference --      Peak Flow --      Pain Score 09/23/22 1857 8     Pain Loc --      Pain Edu? --      Excl. in GC? --    No data found.  Updated Vital Signs BP 131/74 (BP Location: Left Arm)   Pulse 95   Temp 98.2 F (36.8 C) (Oral)   Ht 6' (1.829 m)   Wt 240 lb (108.9 kg)   SpO2 97%   BMI 32.55 kg/m       Physical Exam Vitals and nursing note reviewed.  Constitutional:      General: He is not in acute distress.    Appearance: Normal appearance. He is well-developed. He is not ill-appearing.     Comments: Ambulates with a walker.  HENT:     Head: Normocephalic and atraumatic.  Eyes:     General: No scleral icterus.    Conjunctiva/sclera: Conjunctivae normal.  Cardiovascular:     Rate and Rhythm: Normal rate and regular rhythm.     Heart sounds: Normal heart sounds.  Pulmonary:     Effort: Pulmonary effort is normal. No respiratory distress.     Breath sounds: Normal breath sounds.   Musculoskeletal:     Cervical back: Neck supple.     Lumbar back: Tenderness (TTP left paralumbar muscles, left upper buttocks) present. No bony tenderness. Decreased range of motion. Positive left straight leg raise test. Negative right straight leg raise test.  Skin:    General: Skin is warm and dry.     Capillary Refill: Capillary refill takes less than 2 seconds.  Neurological:     General: No focal deficit present.     Mental Status: He is alert. Mental status is at baseline.     Coordination: Coordination normal.     Gait: Gait abnormal.  Psychiatric:        Mood and Affect: Mood normal.        Behavior: Behavior normal.      UC Treatments / Results  Labs (all labs ordered are listed, but only abnormal results are displayed) Labs Reviewed - No data to display  EKG   Radiology DG Lumbar Spine Complete  Result Date: 09/23/2022 CLINICAL DATA:  Fall EXAM: LUMBAR SPINE - COMPLETE 4+ VIEW COMPARISON:  None Available. FINDINGS: No evidence for lumbar spine fracture. Alignment is anatomic. Disc spaces are maintained. Minimal degenerative endplate osteophytes are seen throughout the lumbar spine. Left hip arthroplasty appears grossly within normal limits. IMPRESSION: No evidence for fracture. Minimal degenerative changes. Electronically Signed   By: Darliss Cheney M.D.   On: 09/23/2022 19:45   DG Hip Unilat W or Wo Pelvis 2-3 Views Left  Result Date: 09/23/2022 CLINICAL DATA:  Fall EXAM: DG HIP (WITH OR WITHOUT PELVIS) 2-3V LEFT COMPARISON:  None Available. FINDINGS: Left hip arthroplasty  is present in anatomic alignment. No evidence for hardware loosening. No acute fracture. Joint spaces are maintained. IMPRESSION: Left hip arthroplasty in anatomic alignment. Electronically Signed   By: Darliss Cheney M.D.   On: 09/23/2022 19:41    Procedures Procedures (including critical care time)  Medications Ordered in UC Medications - No data to display  Initial Impression / Assessment and  Plan / UC Course  I have reviewed the triage vital signs and the nursing notes.  Pertinent labs & imaging results that were available during my care of the patient were reviewed by me and considered in my medical decision making (see chart for details).   57 year old male with history of MS presents for evaluation of left hip pain and lower back pain for the past 2 weeks after a fall in his shower.  Initially seen by [facility].  Says he had x-rays which were negative.  He does not think he had any x-rays done of his hip and thinks that was his back that was x-rayed.  He takes Lyrica for neuropathy.  States he is continue to have pain and discomfort which has not improved over the past couple weeks.  Tried to go to physical therapy but this caused increased pain in his left hip.  X-ray of L-spine and hip obtained today shows no acute abnormality, only mild degenerative changes.  He does have history of left hip replacement which is stable in the x-ray.  Reviewed results of x-ray with patient.  Advised him that he likely needs a different type of imaging such as MRI or CT scan to evaluate for other causes of his suspected lumbar radiculopathy.  Possible bulging or herniated disc, spinal stenosis, other arthropathy, etc.  Patient plans to follow-up with PCP through the [place].  In the meantime advised him to continue with Lyrica and muscle relaxer.  Sent oxycodone as needed for severe pain if Tylenol is not strong enough.  Reviewed holding off on PT until he has further imaging performed.  ED precautions discussed.   Final Clinical Impressions(s) / UC Diagnoses   Final diagnoses:  Left hip pain  Acute left-sided low back pain with left-sided sciatica  Multiple sclerosis (HCC)  History of left hip replacement     Discharge Instructions      -X-rays do not show any acute abnormality but as we discussed x-rays cannot see discs, nerves and soft tissues. -Continue Lyrica for pain/neuropathy -Continue at  home muscle relaxer as needed for spasms -Take pain meds if needed. -Follow up with PCP or ortho. May need MRI - Go to ER for any acute worsening of your pain, falls, increased weakness in your legs or increased numbness or if you have loss of bowel or bladder control.    ED Prescriptions     Medication Sig Dispense Auth. Provider   oxyCODONE (ROXICODONE) 5 MG immediate release tablet Take 1 tablet (5 mg total) by mouth every 6 (six) hours as needed for up to 3 days for severe pain. 12 tablet Shirlee Latch, PA-C      I have reviewed the PDMP during this encounter.   Shirlee Latch, PA-C 09/24/22 1028

## 2022-10-12 ENCOUNTER — Emergency Department: Payer: No Typology Code available for payment source

## 2022-10-12 ENCOUNTER — Encounter: Payer: Self-pay | Admitting: Internal Medicine

## 2022-10-12 ENCOUNTER — Other Ambulatory Visit: Payer: Self-pay

## 2022-10-12 ENCOUNTER — Inpatient Hospital Stay
Admission: EM | Admit: 2022-10-12 | Discharge: 2022-10-14 | DRG: 684 | Disposition: A | Discharge status: Home Health | Payer: No Typology Code available for payment source | Attending: Student | Admitting: Student

## 2022-10-12 DIAGNOSIS — W19XXXA Unspecified fall, initial encounter: Secondary | ICD-10-CM | POA: Diagnosis present

## 2022-10-12 DIAGNOSIS — I951 Orthostatic hypotension: Secondary | ICD-10-CM | POA: Diagnosis present

## 2022-10-12 DIAGNOSIS — E876 Hypokalemia: Secondary | ICD-10-CM | POA: Diagnosis present

## 2022-10-12 DIAGNOSIS — Z87891 Personal history of nicotine dependence: Secondary | ICD-10-CM

## 2022-10-12 DIAGNOSIS — G40909 Epilepsy, unspecified, not intractable, without status epilepticus: Secondary | ICD-10-CM | POA: Diagnosis present

## 2022-10-12 DIAGNOSIS — S0181XA Laceration without foreign body of other part of head, initial encounter: Secondary | ICD-10-CM

## 2022-10-12 DIAGNOSIS — I2510 Atherosclerotic heart disease of native coronary artery without angina pectoris: Secondary | ICD-10-CM | POA: Diagnosis present

## 2022-10-12 DIAGNOSIS — Z955 Presence of coronary angioplasty implant and graft: Secondary | ICD-10-CM

## 2022-10-12 DIAGNOSIS — Z8673 Personal history of transient ischemic attack (TIA), and cerebral infarction without residual deficits: Secondary | ICD-10-CM

## 2022-10-12 DIAGNOSIS — R739 Hyperglycemia, unspecified: Secondary | ICD-10-CM | POA: Diagnosis present

## 2022-10-12 DIAGNOSIS — Z888 Allergy status to other drugs, medicaments and biological substances status: Secondary | ICD-10-CM | POA: Diagnosis not present

## 2022-10-12 DIAGNOSIS — Z880 Allergy status to penicillin: Secondary | ICD-10-CM

## 2022-10-12 DIAGNOSIS — Z8249 Family history of ischemic heart disease and other diseases of the circulatory system: Secondary | ICD-10-CM | POA: Diagnosis not present

## 2022-10-12 DIAGNOSIS — Z7902 Long term (current) use of antithrombotics/antiplatelets: Secondary | ICD-10-CM

## 2022-10-12 DIAGNOSIS — I10 Essential (primary) hypertension: Secondary | ICD-10-CM | POA: Diagnosis present

## 2022-10-12 DIAGNOSIS — F32A Depression, unspecified: Secondary | ICD-10-CM | POA: Diagnosis present

## 2022-10-12 DIAGNOSIS — I252 Old myocardial infarction: Secondary | ICD-10-CM

## 2022-10-12 DIAGNOSIS — R55 Syncope and collapse: Secondary | ICD-10-CM | POA: Diagnosis present

## 2022-10-12 DIAGNOSIS — Z951 Presence of aortocoronary bypass graft: Secondary | ICD-10-CM | POA: Diagnosis not present

## 2022-10-12 DIAGNOSIS — Z9181 History of falling: Secondary | ICD-10-CM

## 2022-10-12 DIAGNOSIS — Z96642 Presence of left artificial hip joint: Secondary | ICD-10-CM | POA: Diagnosis present

## 2022-10-12 DIAGNOSIS — S01111A Laceration without foreign body of right eyelid and periocular area, initial encounter: Secondary | ICD-10-CM | POA: Diagnosis not present

## 2022-10-12 DIAGNOSIS — G35 Multiple sclerosis: Secondary | ICD-10-CM | POA: Diagnosis present

## 2022-10-12 DIAGNOSIS — E785 Hyperlipidemia, unspecified: Secondary | ICD-10-CM | POA: Diagnosis present

## 2022-10-12 DIAGNOSIS — N179 Acute kidney failure, unspecified: Principal | ICD-10-CM | POA: Diagnosis present

## 2022-10-12 DIAGNOSIS — Z79890 Hormone replacement therapy (postmenopausal): Secondary | ICD-10-CM | POA: Diagnosis not present

## 2022-10-12 DIAGNOSIS — G4733 Obstructive sleep apnea (adult) (pediatric): Secondary | ICD-10-CM | POA: Diagnosis present

## 2022-10-12 DIAGNOSIS — Z79899 Other long term (current) drug therapy: Secondary | ICD-10-CM | POA: Diagnosis not present

## 2022-10-12 DIAGNOSIS — Z7982 Long term (current) use of aspirin: Secondary | ICD-10-CM

## 2022-10-12 DIAGNOSIS — R0781 Pleurodynia: Secondary | ICD-10-CM | POA: Diagnosis not present

## 2022-10-12 DIAGNOSIS — E039 Hypothyroidism, unspecified: Secondary | ICD-10-CM | POA: Diagnosis present

## 2022-10-12 LAB — CBC WITH DIFFERENTIAL/PLATELET
Abs Immature Granulocytes: 0.04 K/uL (ref 0.00–0.07)
Basophils Absolute: 0 K/uL (ref 0.0–0.1)
Basophils Relative: 0 %
Eosinophils Absolute: 0.3 K/uL (ref 0.0–0.5)
Eosinophils Relative: 3 %
HCT: 42.2 % (ref 39.0–52.0)
Hemoglobin: 14.1 g/dL (ref 13.0–17.0)
Immature Granulocytes: 0 %
Lymphocytes Relative: 23 %
Lymphs Abs: 2.3 K/uL (ref 0.7–4.0)
MCH: 30.9 pg (ref 26.0–34.0)
MCHC: 33.4 g/dL (ref 30.0–36.0)
MCV: 92.5 fL (ref 80.0–100.0)
Monocytes Absolute: 0.8 K/uL (ref 0.1–1.0)
Monocytes Relative: 8 %
Neutro Abs: 6.7 K/uL (ref 1.7–7.7)
Neutrophils Relative %: 66 %
Platelets: 506 K/uL — ABNORMAL HIGH (ref 150–400)
RBC: 4.56 MIL/uL (ref 4.22–5.81)
RDW: 14 % (ref 11.5–15.5)
WBC: 10.1 K/uL (ref 4.0–10.5)
nRBC: 0 % (ref 0.0–0.2)

## 2022-10-12 LAB — CBC
HCT: 46.1 % (ref 39.0–52.0)
HCT: 41.9 % (ref 39.0–52.0)
Hemoglobin: 14.6 g/dL (ref 13.0–17.0)
Hemoglobin: 14.1 g/dL (ref 13.0–17.0)
MCH: 30.9 pg (ref 26.0–34.0)
MCH: 31.1 pg (ref 26.0–34.0)
MCHC: 31.7 g/dL (ref 30.0–36.0)
MCHC: 33.7 g/dL (ref 30.0–36.0)
MCV: 97.7 fL (ref 80.0–100.0)
MCV: 92.3 fL (ref 80.0–100.0)
Platelets: 504 10*3/uL — ABNORMAL HIGH (ref 150–400)
Platelets: 477 10*3/uL — ABNORMAL HIGH (ref 150–400)
RBC: 4.72 MIL/uL (ref 4.22–5.81)
RBC: 4.54 MIL/uL (ref 4.22–5.81)
RDW: 13.7 % (ref 11.5–15.5)
RDW: 14 % (ref 11.5–15.5)
WBC: 11.2 10*3/uL — ABNORMAL HIGH (ref 4.0–10.5)
WBC: 10.1 10*3/uL (ref 4.0–10.5)
nRBC: 0 % (ref 0.0–0.2)
nRBC: 0 % (ref 0.0–0.2)

## 2022-10-12 LAB — URINALYSIS, COMPLETE (UACMP) WITH MICROSCOPIC
Bilirubin Urine: NEGATIVE
Glucose, UA: NEGATIVE mg/dL
Hgb urine dipstick: NEGATIVE
Ketones, ur: 5 mg/dL — AB
Leukocytes,Ua: NEGATIVE
Nitrite: NEGATIVE
Protein, ur: 30 mg/dL — AB
Specific Gravity, Urine: 1.016 (ref 1.005–1.030)
pH: 5 (ref 5.0–8.0)

## 2022-10-12 LAB — BASIC METABOLIC PANEL
Anion gap: 12 (ref 5–15)
Anion gap: 10 (ref 5–15)
BUN: 28 mg/dL — ABNORMAL HIGH (ref 6–20)
BUN: 29 mg/dL — ABNORMAL HIGH (ref 6–20)
CO2: 17 mmol/L — ABNORMAL LOW (ref 22–32)
CO2: 25 mmol/L (ref 22–32)
Calcium: 8.8 mg/dL — ABNORMAL LOW (ref 8.9–10.3)
Calcium: 9.4 mg/dL (ref 8.9–10.3)
Chloride: 107 mmol/L (ref 98–111)
Chloride: 100 mmol/L (ref 98–111)
Creatinine, Ser: 2.57 mg/dL — ABNORMAL HIGH (ref 0.61–1.24)
Creatinine, Ser: 2.46 mg/dL — ABNORMAL HIGH (ref 0.61–1.24)
GFR, Estimated: 28 mL/min — ABNORMAL LOW (ref >60)
GFR, Estimated: 30 mL/min — ABNORMAL LOW (ref >60)
Glucose, Bld: 83 mg/dL (ref 70–99)
Glucose, Bld: 127 mg/dL — ABNORMAL HIGH (ref 70–99)
Potassium: 3.6 mmol/L (ref 3.5–5.1)
Potassium: 3 mmol/L — ABNORMAL LOW (ref 3.5–5.1)
Sodium: 136 mmol/L (ref 135–145)
Sodium: 135 mmol/L (ref 135–145)

## 2022-10-12 LAB — OSMOLALITY, URINE: Osmolality, Ur: 470 mOsm/kg (ref 300–900)

## 2022-10-12 LAB — TSH: TSH: 1.214 u[IU]/mL (ref 0.350–4.500)

## 2022-10-12 LAB — CK: Total CK: 343 U/L (ref 49–397)

## 2022-10-12 LAB — OSMOLALITY: Osmolality: 296 mOsm/kg — ABNORMAL HIGH (ref 275–295)

## 2022-10-12 LAB — HIV ANTIBODY (ROUTINE TESTING W REFLEX): HIV Screen 4th Generation wRfx: NONREACTIVE

## 2022-10-12 LAB — SODIUM, URINE, RANDOM: Sodium, Ur: 27 mmol/L

## 2022-10-12 MED ORDER — FOLIC ACID 1 MG PO TABS
500.0000 ug | ORAL_TABLET | Freq: Every day | ORAL | Status: DC
  Administered 2022-10-13 – 2022-10-14 (×2): 0.5 mg via ORAL
  Filled 2022-10-12 (×2): qty 1

## 2022-10-12 MED ORDER — CLOPIDOGREL BISULFATE 75 MG PO TABS
75.0000 mg | ORAL_TABLET | Freq: Every day | ORAL | Status: DC
  Administered 2022-10-12 – 2022-10-14 (×3): 75 mg via ORAL
  Filled 2022-10-12 (×2): qty 1

## 2022-10-12 MED ORDER — DOCUSATE SODIUM 100 MG PO CAPS
100.0000 mg | ORAL_CAPSULE | Freq: Two times a day (BID) | ORAL | Status: DC
  Administered 2022-10-12 – 2022-10-14 (×4): 100 mg via ORAL
  Filled 2022-10-12 (×4): qty 1

## 2022-10-12 MED ORDER — SODIUM CHLORIDE 0.9 % IV SOLN: Freq: Once | INTRAVENOUS | Status: AC

## 2022-10-12 MED ORDER — OXCARBAZEPINE 150 MG PO TABS
150.0000 mg | ORAL_TABLET | Freq: Two times a day (BID) | ORAL | Status: DC
  Administered 2022-10-12 – 2022-10-14 (×4): 150 mg via ORAL
  Filled 2022-10-12 (×4): qty 1

## 2022-10-12 MED ORDER — ATORVASTATIN CALCIUM 20 MG PO TABS
80.0000 mg | ORAL_TABLET | Freq: Every day | ORAL | Status: DC
  Administered 2022-10-12 – 2022-10-13 (×2): 80 mg via ORAL
  Filled 2022-10-12 (×2): qty 4

## 2022-10-12 MED ORDER — ESCITALOPRAM OXALATE 10 MG PO TABS
20.0000 mg | ORAL_TABLET | Freq: Every day | ORAL | Status: DC
  Administered 2022-10-13 – 2022-10-14 (×2): 20 mg via ORAL
  Filled 2022-10-12 (×2): qty 2

## 2022-10-12 MED ORDER — MONTELUKAST SODIUM 10 MG PO TABS
10.0000 mg | ORAL_TABLET | Freq: Every day | ORAL | Status: DC
  Administered 2022-10-12 – 2022-10-14 (×3): 10 mg via ORAL
  Filled 2022-10-12 (×2): qty 1

## 2022-10-12 MED ORDER — BISACODYL 5 MG PO TBEC: 5.0000 mg | DELAYED_RELEASE_TABLET | Freq: Every day | ORAL | Status: DC | PRN

## 2022-10-12 MED ORDER — LEVOTHYROXINE SODIUM 50 MCG PO TABS: 25.0000 ug | ORAL_TABLET | Freq: Every day | ORAL | Status: DC

## 2022-10-12 MED ORDER — ENOXAPARIN SODIUM 60 MG/0.6ML IJ SOSY
50.0000 mg | PREFILLED_SYRINGE | INTRAMUSCULAR | Status: DC
  Administered 2022-10-12 – 2022-10-13 (×2): 50 mg via SUBCUTANEOUS
  Filled 2022-10-12 (×2): qty 0.6

## 2022-10-12 MED ORDER — LIDOCAINE HCL (PF) 1 % IJ SOLN
5.0000 mL | Freq: Once | INTRAMUSCULAR | Status: DC
  Administered 2022-10-12: 5 mL

## 2022-10-12 MED ORDER — TIZANIDINE HCL 4 MG PO TABS
4.0000 mg | ORAL_TABLET | Freq: Three times a day (TID) | ORAL | Status: DC | PRN
  Administered 2022-10-13: 4 mg via ORAL
  Filled 2022-10-12 (×2): qty 1

## 2022-10-12 MED ORDER — LACTATED RINGERS IV SOLN: INTRAVENOUS | Status: DC

## 2022-10-12 MED ORDER — VITAMIN B-12 1000 MCG PO TABS
1000.0000 ug | ORAL_TABLET | Freq: Every day | ORAL | Status: DC
  Administered 2022-10-13 – 2022-10-14 (×2): 1000 ug via ORAL
  Filled 2022-10-12 (×2): qty 1

## 2022-10-12 MED ORDER — LIDOCAINE-EPINEPHRINE 2 %-1:100000 IJ SOLN
20.0000 mL | Freq: Once | INTRAMUSCULAR | Status: AC
  Administered 2022-10-12: 20 mL
  Filled 2022-10-12: qty 1

## 2022-10-12 MED ORDER — LEVOTHYROXINE SODIUM 25 MCG PO TABS
25.0000 ug | ORAL_TABLET | Freq: Every day | ORAL | Status: DC
  Administered 2022-10-13 – 2022-10-14 (×2): 25 ug via ORAL
  Filled 2022-10-12 (×2): qty 1

## 2022-10-12 MED ORDER — ASPIRIN 81 MG PO TBEC
81.0000 mg | DELAYED_RELEASE_TABLET | Freq: Every day | ORAL | Status: DC
  Administered 2022-10-12 – 2022-10-14 (×3): 81 mg via ORAL
  Filled 2022-10-12 (×2): qty 1

## 2022-10-12 MED ORDER — OXYCODONE HCL 5 MG PO TABS
5.0000 mg | ORAL_TABLET | ORAL | Status: DC | PRN
  Administered 2022-10-13 – 2022-10-14 (×4): 5 mg via ORAL
  Filled 2022-10-12 (×4): qty 1

## 2022-10-12 MED ORDER — QUETIAPINE FUMARATE 25 MG PO TABS
100.0000 mg | ORAL_TABLET | Freq: Every day | ORAL | Status: DC
  Administered 2022-10-12 – 2022-10-13 (×2): 100 mg via ORAL
  Filled 2022-10-12 (×2): qty 4

## 2022-10-12 MED ORDER — POTASSIUM CHLORIDE 20 MEQ PO PACK
20.0000 meq | PACK | Freq: Two times a day (BID) | ORAL | Status: DC
  Administered 2022-10-13: 20 meq via ORAL
  Filled 2022-10-12: qty 1

## 2022-10-12 MED ORDER — ACETAMINOPHEN 325 MG PO TABS
650.0000 mg | ORAL_TABLET | Freq: Four times a day (QID) | ORAL | Status: DC | PRN
  Administered 2022-10-12: 650 mg via ORAL

## 2022-10-12 NOTE — ED Triage Notes (Signed)
Pt to ED [facility] from home for fall last night. PT came today and noticed laceration to top of right eyebrow. +blood thinners. Reports right sided rib pain from fall and generalized aches all over Hypotensive with EMS. 22g to left hand with NS Pt unsure what caused fall

## 2022-10-12 NOTE — ED Provider Notes (Signed)
PROCEDURES:  Critical Care performed:   Marland KitchenMarland KitchenLaceration Repair  Date/Time: 10/12/2022 2:10 PM  Performed by: Jackelyn Hoehn, PA-C Authorized by: Jackelyn Hoehn, PA-C   Consent:    Consent obtained:  Verbal   Consent given by:  Patient   Risks, benefits, and alternatives were discussed: yes     Risks discussed:  Infection, poor cosmetic result, pain, poor wound healing, need for additional repair, nerve damage, tendon damage, vascular damage and retained foreign body   Alternatives discussed:  No treatment Universal protocol:    Procedure explained and questions answered to patient or proxy's satisfaction: yes     Relevant documents present and verified: yes     Test results available: yes     Imaging studies available: yes     Required blood products, implants, devices, and special equipment available: yes     Site/side marked: yes     Immediately prior to procedure, a time out was called: yes     Patient identity confirmed:  Verbally with patient Anesthesia:    Anesthesia method:  Local infiltration   Local anesthetic:  Lidocaine 1% WITH epi Laceration details:    Location:  Face   Face location:  R eyebrow   Length (cm):  2   Depth (mm):  2 Pre-procedure details:    Preparation:  Patient was prepped and draped in usual sterile fashion and imaging obtained to evaluate for foreign bodies Exploration:    Hemostasis achieved with:  Direct pressure and epinephrine   Imaging outcome: foreign body not noted     Wound exploration: wound explored through full range of motion and entire depth of wound visualized     Wound extent: areolar tissue not violated, fascia not violated, no foreign body, no nerve damage, no underlying fracture and no vascular damage     Contaminated: no   Treatment:    Area cleansed with:  Saline   Amount of cleaning:  Standard   Irrigation solution:  Sterile saline   Irrigation method:  Pressure wash and syringe   Debridement:  None   Undermining:   None   Scar revision: no   Skin repair:    Repair method:  Sutures   Suture size:  5-0   Suture material:  Nylon   Suture technique:  Simple interrupted   Number of sutures:  2 Approximation:    Approximation:  Close Repair type:    Repair type:  Simple Post-procedure details:    Dressing:  Open (no dressing)   Procedure completion:  Tolerated well, no immediate complications .Marland KitchenLaceration Repair  Date/Time: 10/12/2022 2:11 PM  Performed by: Jackelyn Hoehn, PA-C Authorized by: Jackelyn Hoehn, PA-C   Consent:    Consent obtained:  Verbal   Consent given by:  Patient   Risks, benefits, and alternatives were discussed: yes     Risks discussed:  Nerve damage, infection, need for additional repair, pain, poor cosmetic result, poor wound healing, vascular damage, tendon damage and retained foreign body   Alternatives discussed:  No treatment Universal protocol:    Procedure explained and questions answered to patient or proxy's satisfaction: yes     Relevant documents present and verified: yes     Test results available: yes     Imaging studies available: yes     Required blood products, implants, devices, and special equipment available: yes     Site/side marked: yes     Immediately prior to procedure, a time out was called: yes  Patient identity confirmed:  Verbally with patient Anesthesia:    Anesthesia method:  Local infiltration   Local anesthetic:  Lidocaine 1% WITH epi Laceration details:    Location:  Face   Face location:  R eyebrow   Length (cm):  3   Depth (mm):  3 Pre-procedure details:    Preparation:  Patient was prepped and draped in usual sterile fashion and imaging obtained to evaluate for foreign bodies Exploration:    Limited defect created (wound extended): no     Hemostasis achieved with:  Direct pressure and epinephrine   Imaging outcome: foreign body not noted     Wound exploration: wound explored through full range of motion and entire depth of  wound visualized     Wound extent: areolar tissue not violated, fascia not violated, no foreign body, no tendon damage, no underlying fracture and no vascular damage     Contaminated: no   Treatment:    Area cleansed with:  Saline   Amount of cleaning:  Standard   Irrigation solution:  Sterile saline   Irrigation method:  Pressure wash and syringe   Debridement:  None   Undermining:  None   Scar revision: no   Skin repair:    Repair method:  Sutures   Suture size:  5-0   Suture material:  Nylon   Suture technique:  Simple interrupted   Number of sutures:  3 Repair type:    Repair type:  Simple Post-procedure details:    Dressing:  Open (no dressing)   Procedure completion:  Tolerated well, no immediate complications     Jackelyn Hoehn, PA-C 10/12/22 1413    Phineas Semen, MD 10/12/22 1444

## 2022-10-12 NOTE — ED Notes (Signed)
Transferred from recliner chair to stretcher well, 2 assist.  Patient stood well and tolerated transfer well.  Patient to Xray via stretcher.

## 2022-10-12 NOTE — ED Notes (Signed)
Patient transported to CT 

## 2022-10-12 NOTE — ED Provider Notes (Signed)
[facility] Provider Note    Event Date/Time   First MD Initiated Contact with Patient 10/12/22 [phone]     (approximate)   History   Fall   HPI  Cody Burns is a 57 y.o. male who presents to the emergency department today after suffering a fall yesterday.  He is unsure why he fell yesterday.  Does not think that he lost consciousness.  Apparently he was advised to come to the emergency department today because of generalized aches and right-sided rib pain.  Patient denies any significant shortness of breath.  Denies any recent illness.  Denies any fevers.     Physical Exam   Triage Vital Signs: ED Triage Vitals  Enc Vitals Group     BP 10/12/22 0935 (!) 86/75     Pulse Rate 10/12/22 0935 88     Resp 10/12/22 0935 16     Temp 10/12/22 0938 98.5 F (36.9 C)     Temp src --      SpO2 10/12/22 0935 100 %     Weight 10/12/22 0934 230 lb (104.3 kg)     Height 10/12/22 0934 6' (1.829 m)     Head Circumference --      Peak Flow --      Pain Score 10/12/22 0933 8     Pain Loc --      Pain Edu? --      Excl. in GC? --     Most recent vital signs: Vitals:   10/12/22 0935 10/12/22 0938  BP: (!) 86/75   Pulse: 88   Resp: 16   Temp:  98.5 F (36.9 C)  SpO2: 100%    General: Awake, alert, oriented. CV:  Good peripheral perfusion. Regular rate and rhythm. Resp:  Normal effort. Lungs clear Abd:  No distention.  Skin:  Lacerations to   ED Results / Procedures / Treatments   Labs (all labs ordered are listed, but only abnormal results are displayed) Labs Reviewed  CBC - Abnormal; Notable for the following components:      Result Value   WBC 11.2 (*)    Platelets 504 (*)    All other components within normal limits  BASIC METABOLIC PANEL - Abnormal; Notable for the following components:   CO2 17 (*)    BUN 28 (*)    Creatinine, Ser 2.57 (*)    Calcium 8.8 (*)    GFR, Estimated 28 (*)    All other components within normal limits      EKG  I, Phineas Semen, attending physician, personally viewed and interpreted this EKG  EKG Time: 0938 Rate: 90 Rhythm: normal sinus rhythm Axis: left axis deviation Intervals: qtc 450 QRS: narrow ST changes: no st elevation Impression: abnormal ekg   RADIOLOGY I independently interpreted and visualized the CT head. My interpretation: no intracranial bleed Radiology interpretation:  IMPRESSION:  1. No acute intracranial abnormalities.  2. Chronic left basal ganglia lacunar infarcts with ex vacuo  dilatation of the anterior horn of the left lateral ventricle.  3. Chronic microvascular disease.    I independently interpreted and visualized the CXR. My interpretation: no pneumonia Radiology interpretation:  IMPRESSION:  No acute cardiopulmonary abnormality or acute traumatic injury  identified.     PROCEDURES:  Critical Care performed: No   MEDICATIONS ORDERED IN ED: Medications - No data to display   IMPRESSION / MDM / ASSESSMENT AND PLAN / ED COURSE  I reviewed the triage vital signs and  the nursing notes.                              Differential diagnosis includes, but is not limited to, rib fracture, intracranial bleed, anemia, electrolyte abnormality, dehydration  Patient's presentation is most consistent with acute presentation with potential threat to life or bodily function.  Patient presented to the emergency department today because of concerns for pain after a fall yesterday.  On exam patient does have small lacerations to the right eyebrow. Head CT fortunately without any acute intracranial traumatic findings. CXR without obvious rib fracture and no pneumothorax. Blood work however is concerning for AKI. Most recent creatinine found through care everywhere was normal. Will start IV fluids. Discussed with Dr. Criselda Peaches with the hospitalist service who will plan on admission.  FINAL CLINICAL IMPRESSION(S) / ED DIAGNOSES   Final diagnoses:  Fall,  initial encounter  AKI (acute kidney injury) (HCC)  Facial laceration, initial encounter        Note:  This document was prepared using Dragon voice recognition software and may include unintentional dictation errors.    Phineas Semen, MD 10/13/22 [phone]

## 2022-10-12 NOTE — H&P (Signed)
History and Physical    Cody Burns:[id number] DOB: Nov 13, 1965 DOA: 10/12/2022  PCP: [facility]  Patient coming from: Home  Chief Complaint: Fall  HPI: Cody Burns is a 57 y.o. male with medical history significant of multiple sclerosis, HTN, hypothyroidism, CAD s/p CABG X 4, OSA, seizures as a child who presented for a fall.  He notes a long history of falls and black outs related to his MS.  He reports that yesterday evening, he was in his normal state of health, when he awoke on the floor.  He does not remember this happening.  He did not trip, he lost consciousness.  He has no other symptoms, no chest pain, no SOB, no headache, no change in vision, no weakness on one side or the other.  He does have a gash on his right eyebrow and a bruise on the right back.  He believes he was out for about 30 minutes, but could have been longer.  When he awoke, he got up and went to bed.  This morning his caretaker and PT came to the home and told him to get checked out in the ED.  He was last in the ED about 1 month ago and had normal renal function at that time.  He notes only that he is hungry, but otherwise is in good spirits.  We went through his medications.  He is not aware of all of them, but he is on quite a few centrally acting medications.   ED Course: In the ED, he was evaluated and found to have an AKI with BUN of 28 and Cr of 2.57.  At last check in Care everywhere his Cr was 1.1-1.3 and he had very low K at that time as well.  Today K is 3.6.  WBC is 11.2, differential is pending.  UA and CK are ordered.  CT head showed no acute abnormalities, but some chronic microvascular changes. CXR showed some chronic rib fractures, no acute.  EKG showed a very tremulous baseline, TWI in V2.    Review of Systems: As per HPI otherwise all other systems reviewed and are negative.   Past Medical History:  Diagnosis Date   Blackout 2018   several episodes with unknown etiology. cause of  his ankle break. awaiting neuro workup   Hypertension    Hypothyroidism    Multiple sclerosis (HCC) 1990   Myocardial infarct (HCC)    Optic neuritis    x 3. takes steriods and it resolves.   Seizures (HCC)    as a child. diagnosed with MS and once treated, seizures resolved.   Sleep apnea     Past Surgical History:  Procedure Laterality Date   CORONARY ARTERY BYPASS GRAFT  2015   CORONARY STENT INTERVENTION N/A 06/07/2017   Procedure: CORONARY STENT INTERVENTION;  Surgeon: Alwyn Pea, MD;  Location: [facility] INVASIVE CV LAB;  Service: Cardiovascular;  Laterality: N/A;   JOINT REPLACEMENT Left 2007   left hip replacement   LEFT HEART CATH AND CORS/GRAFTS ANGIOGRAPHY N/A 06/07/2017   Procedure: LEFT HEART CATH AND CORS/GRAFTS ANGIOGRAPHY;  Surgeon: Lamar Blinks, MD;  Location: [facility] INVASIVE CV LAB;  Service: Cardiovascular;  Laterality: N/A;   ORIF ANKLE FRACTURE Right 09/22/2016   Procedure: OPEN REDUCTION INTERNAL FIXATION (ORIF) ANKLE FRACTURE;  Surgeon: Christena Flake, MD;  Location: [facility] ORS;  Service: Orthopedics;  Laterality: Right;   quadruple cardiac bypass  2015    Social History  reports that he  has never smoked. He has quit using smokeless tobacco.  His smokeless tobacco use included chew. He reports that he does not currently use alcohol. He reports that he does not use drugs.  Allergies  Allergen Reactions   Hydrochlorothiazide W-Triamterene Other (See Comments) and Palpitations    High blood pressure   Metoprolol Other (See Comments) and Palpitations    Exacerbates MS, High blood pressure   Penicillins Other (See Comments)    Sores in mouth  Has patient had a PCN reaction causing immediate rash, facial/tongue/throat swelling, SOB or lightheadedness with hypotension: No  Has patient had a PCN reaction causing severe rash involving mucus membranes or skin necrosis: No  Has patient had a PCN reaction that required hospitalization: No  Has patient had a PCN  reaction occurring within the last 10 years: No  If all of the above answers are "NO", then may proceed with Cephalosporin use.  MOUTH SORES   Aripiprazole Other (See Comments)    WORSENED SLEEP WALKING    Family History  Problem Relation Age of Onset   Cancer Mother    Other Father    CAD Brother     Prior to Admission medications   Medication Sig Start Date End Date Taking? Authorizing Provider  aspirin EC 81 MG tablet Take 81 mg by mouth daily.    [provider]  atorvastatin (LIPITOR) 80 MG tablet Take 80 mg by mouth at bedtime.    [provider]  buPROPion (WELLBUTRIN XL) 300 MG 24 hr tablet Take 300 mg by mouth daily.    [provider]  clopidogrel (PLAVIX) 75 MG tablet Take 75 mg by mouth daily.    [provider]  cyclobenzaprine (FLEXERIL) 10 MG tablet REPORTS NOT TAKING - On Tizanidine Take 1 tablet (10 mg total) by mouth 3 (three) times daily. 06/11/17   Shaune Pollack, MD  docusate sodium (COLACE) 50 MG capsule Take 50 mg by mouth 2 (two) times daily.    [provider]  escitalopram (LEXAPRO) 20 MG tablet Take 20 mg by mouth daily.    [provider]  folic acid (FOLVITE) 400 MCG tablet Take 400 mcg by mouth daily.    [provider]  gabapentin (NEURONTIN) 300 MG capsule Take 900 mg by mouth 3 (three) times daily.     [provider]  lamoTRIgine (LAMICTAL) 25 MG tablet - UNSURE IF TAKING, CARE EVERYWHERE SAYS 150mg  Take 25 mg by mouth daily.    [provider]  levothyroxine (SYNTHROID, LEVOTHROID) 25 MCG tablet Take 25 mcg by mouth daily before breakfast.     [provider]  lisinopril (PRINIVIL,ZESTRIL) 20 MG tablet Take 20 mg by mouth daily.     [provider]  montelukast (SINGULAIR) 10 MG tablet Take 10 mg by mouth daily.     [provider]         OXcarbazepine (TRILEPTAL) 150 MG tablet Take 1 tablet by mouth 2 (two) times daily. 02/23/20   [provider]  QUEtiapine (SEROQUEL) 100 MG tablet Take 100 mg by mouth at bedtime.    [provider]  vitamin B-12 (CYANOCOBALAMIN) 1000 MCG tablet Take 1,000 mcg by mouth daily.  07/09/09   [provider]    Physical Exam: Vitals:   10/12/22 0934 10/12/22 0935 10/12/22 0938 10/12/22 1118  BP:  (!) 86/75  92/76  Pulse:  88  86  Resp:  16  16  Temp:   98.5 F (36.9 C)  98.5 F (36.9 C)  SpO2:  100%  100%  Weight: 104.3 kg     Height: 6' (1.829 m)       Constitutional: NAD, calm, comfortable, lying in bed Eyes: PERRL, lids and conjunctivae normal ENMT: Mucous membranes are mildly dry Neck: normal, supple Respiratory: Breathing comfortably, no wheezing Cardiovascular: RR, NR, no murmur, no LE swelling Abdomen: +BS, NT, ND, soft Musculoskeletal: normal tone and bulk for age and MS.   Skin: He has darkened pigmentation to bilateral LE and LUE, IV in place in left arm Neurologic: Speech is normal, CN grossly intact, sensation intact to light touch, Strength 5/5 in LE bilaterally  Psychiatric: Normal judgment and insight. Alert and oriented x 3. Normal mood.    Labs on Admission: I have personally reviewed following labs and imaging studies  CBC: Recent Labs  Lab 10/12/22 0937  WBC 11.2*  HGB 14.6  HCT 46.1  MCV 97.7  PLT 504*    Basic Metabolic Panel: Recent Labs  Lab 10/12/22 0937  NA 136  K 3.6  CL 107  CO2 17*  GLUCOSE 83  BUN 28*  CREATININE 2.57*  CALCIUM 8.8*    GFR: Estimated Creatinine Clearance: 39.1 mL/min (A) (by C-G formula based on SCr of 2.57 mg/dL (H)).  Liver Function Tests: No results for input(s): "AST", "ALT", "ALKPHOS", "BILITOT", "PROT", "ALBUMIN" in the last 168 hours.  Urine analysis: Pending  Radiological Exams on Admission: CT Head Wo Contrast  Result Date: 10/12/2022 CLINICAL DATA:  Status post fall. EXAM: CT HEAD WITHOUT CONTRAST TECHNIQUE: Contiguous axial images were obtained from the base of the skull  through the vertex without intravenous contrast. RADIATION DOSE REDUCTION: This exam was performed according to the departmental dose-optimization program which includes automated exposure control, adjustment of the mA and/or kV according to patient size and/or use of iterative reconstruction technique. COMPARISON:  06/05/2017 FINDINGS: Brain: No evidence of acute infarction, hemorrhage, hydrocephalus, extra-axial collection or mass lesion/mass effect. Chronic left basal ganglia lacunar infarcts are identified with ex vacuo dilatation of the anterior horn of the left lateral ventricle. There is low-attenuation within the subcortical and periventricular white matter compatible with chronic microvascular disease. Vascular: No hyperdense vessel or unexpected calcification. Skull: Previous left parietal craniotomy defect. Negative for acute fracture or focal lesion. Sinuses/Orbits: No acute abnormality. Other: None. IMPRESSION: 1. No acute intracranial abnormalities. 2. Chronic left basal ganglia lacunar infarcts with ex vacuo dilatation of the anterior horn of the left lateral ventricle. 3. Chronic microvascular disease. Electronically Signed   By: Signa Kell M.D.   On: 10/12/2022 11:13   DG Chest 2 View  Result Date: 10/12/2022 CLINICAL DATA:  57 year old male status post fall last night. On blood thinners. Right side pain. EXAM: CHEST - 2 VIEW COMPARISON:  Chest radiographs 07/26/2017. FINDINGS: AP seated and lateral views of the chest at 0956 hours. Chronic CABG. Normal cardiac size and mediastinal contours. Lung volumes remain normal. Both lungs appear clear. No pneumothorax or pleural effusion. Chronic sternotomy. Subtle fractures of the right anterior 5th and 7th ribs, although probably chronic and 7th rib deformity identified last time. No acute osseous abnormality identified. Paucity of bowel gas in the upper abdomen. Visualized tracheal air column is within normal limits. IMPRESSION: No acute  cardiopulmonary abnormality or acute traumatic injury identified. Electronically Signed   By: Odessa Fleming M.D.   On: 10/12/2022 10:13    EKG: Independently reviewed. TWI in V2, wavering baseline  Assessment/Plan  AKI (acute kidney injury) (HCC) -  Urine studies (osm, na) and UA pending - CK and osmolality pending - S/P 500cc of NS - LR at 100cc/hr for 10 hours - Repeat BMET this afternoon - Check TSH  Hypertension - Hold lisinopril and amlodipine - Will attempt to get home medication records - Hold renally dosed medications    Multiple sclerosis, relapsing-remitting (HCC) Syncope - On Ocrevus outpatient - Reports history of blacking out related to his MS, however, with no prodrome, seems reasonable to further work up given h/o CAD s/p CABG - TTE ordered - Telemetry ordered - will need review - Hold lyrica given renal failure (reported to be on 150mg  TID) - continue oxcarbazepine (adjustment for starting the medication, not chronic dosages) and quetiapine (no dosage adjustment needed) - hold wellbutrin until renal function improves - Continue lexapro, monitor Qtc - EKG ordered for the AM    Hyperlipidemia LDL goal <70 - Continue statin    Hypothyroidism - Check TSH - Continue synthroid  H/O CAD, H/O Stroke (ICH in 2018, small lacunar previously as well) - CT did not show acute bleeding - Continue asa, plavix, statin  Forehead laceration Chronic rib fractures - Oxycodone and tylenol for pain - Avoid NSAIDs    DVT prophylaxis: Lovenox  Code Status:   Full, confirmed with patient  Family Communication:  None at bedside, brother is HCPOA  Disposition Plan:   Patient is from:  Home  Anticipated DC to:  HOme  Anticipated DC date:  10/14/22  Anticipated DC barriers: Improvement in renal function  Consults called:  None  Admission status:  Med Surg   Severity of Illness: The appropriate patient status for this patient is INPATIENT. Inpatient status is judged to be  reasonable and necessary in order to provide the required intensity of service to ensure the patient's safety. The patient's presenting symptoms, physical exam findings, and initial radiographic and laboratory data in the context of their chronic comorbidities is felt to place them at high risk for further clinical deterioration. Furthermore, it is not anticipated that the patient will be medically stable for discharge from the hospital within 2 midnights of admission.   * I certify that at the point of admission it is my clinical judgment that the patient will require inpatient hospital care spanning beyond 2 midnights from the point of admission due to high intensity of service, high risk for further deterioration and high frequency of surveillance required.Debe Coder MD [facility]  How to contact the [facility] Attending or Consulting provider 7A - 7P or covering provider during after hours 7P -7A, for this patient?   Check the care team in [facility] and look for a) attending/consulting [facility] provider listed and b) the [facility] team listed Log into [email or web address] and use [place]'s universal password to access. If you do not have the password, please contact the hospital operator. Locate the [facility] provider you are looking for under [facility] and page to a number that you can be directly reached. If you still have difficulty reaching the provider, please page the [facility] (Director on Call) for the Hospitalists listed on amion for assistance.  10/12/2022, 1:46 PM

## 2022-10-13 ENCOUNTER — Inpatient Hospital Stay
Admit: 2022-10-13 | Discharge: 2022-10-13 | Disposition: A | Discharge status: Home / Self Care | Payer: No Typology Code available for payment source | Attending: Internal Medicine | Admitting: Internal Medicine

## 2022-10-13 DIAGNOSIS — N179 Acute kidney failure, unspecified: Secondary | ICD-10-CM

## 2022-10-13 LAB — DIFFERENTIAL

## 2022-10-13 LAB — BASIC METABOLIC PANEL
Anion gap: 8 (ref 5–15)
BUN: 25 mg/dL — ABNORMAL HIGH (ref 6–20)
CO2: 24 mmol/L (ref 22–32)
Calcium: 9.2 mg/dL (ref 8.9–10.3)
Chloride: 106 mmol/L (ref 98–111)
Creatinine, Ser: 1.72 mg/dL — ABNORMAL HIGH (ref 0.61–1.24)
GFR, Estimated: 46 mL/min — ABNORMAL LOW (ref >60)
Glucose, Bld: 99 mg/dL (ref 70–99)
Potassium: 3.3 mmol/L — ABNORMAL LOW (ref 3.5–5.1)
Sodium: 138 mmol/L (ref 135–145)

## 2022-10-13 LAB — CBC
HCT: 37.8 % — ABNORMAL LOW (ref 39.0–52.0)
Hemoglobin: 12.6 g/dL — ABNORMAL LOW (ref 13.0–17.0)
MCH: 30.9 pg (ref 26.0–34.0)
MCHC: 33.3 g/dL (ref 30.0–36.0)
MCV: 92.6 fL (ref 80.0–100.0)
Platelets: 374 10*3/uL (ref 150–400)
RBC: 4.08 MIL/uL — ABNORMAL LOW (ref 4.22–5.81)
RDW: 13.7 % (ref 11.5–15.5)
WBC: 6.7 10*3/uL (ref 4.0–10.5)
nRBC: 0 % (ref 0.0–0.2)

## 2022-10-13 LAB — ECHOCARDIOGRAM COMPLETE

## 2022-10-13 MED ORDER — POTASSIUM CHLORIDE CRYS ER 20 MEQ PO TBCR
40.0000 meq | EXTENDED_RELEASE_TABLET | Freq: Once | ORAL | Status: AC
  Administered 2022-10-13: 40 meq via ORAL
  Filled 2022-10-13: qty 2

## 2022-10-13 MED ORDER — ACETAMINOPHEN 325 MG PO TABS: 650.0000 mg | ORAL_TABLET | Freq: Four times a day (QID) | ORAL | Status: DC | PRN

## 2022-10-13 MED ORDER — SODIUM CHLORIDE 0.9 % IV SOLN: INTRAVENOUS | Status: DC

## 2022-10-13 MED ORDER — LAMOTRIGINE 25 MG PO TABS
25.0000 mg | ORAL_TABLET | Freq: Every day | ORAL | Status: DC
  Administered 2022-10-13 – 2022-10-14 (×2): 25 mg via ORAL
  Filled 2022-10-13 (×2): qty 1

## 2022-10-13 NOTE — Progress Notes (Signed)
*  PRELIMINARY RESULTS* Echocardiogram 2D Echocardiogram has been performed.  Cody Burns 10/13/2022, 9:27 AM

## 2022-10-13 NOTE — Progress Notes (Signed)
[facility] Progress Note  Patient: Cody Burns    WUJ:[id number]  DOA: 10/12/2022     Date of Service: the patient was seen and examined on 10/13/2022  Chief Complaint  Patient presents with   Fall   Brief hospital course: Cody Burns is a 57 y.o. male with medical history significant of multiple sclerosis, HTN, hypothyroidism, CAD s/p CABG X 4, OSA, seizures as a child who presented for a fall.  He notes a long history of falls and black outs related to his MS.  ED w/up: BP 86/75, HR 88, saturating well on room air Hypokalemia potassium 3.0, AKI creatinine 2.46, mild hyperglycemia glucose 127, CK3 43 normal [facility] hospitalist consulted for admission and further management as below.  Assessment and Plan:  # Syncope and fall, most likely secondary to orthostatic hypotension Check orthostatic vital signs, continue fall precautions Held lisinopril for now Continue IV fluid for hydration Follow TTE Follow PT and OT eval  AKI secondary to hypotension, prerenal Creatinine 2.46 on admission Continue IV fluid for hydration Monitor renal functions and urine output Creatinine 1.72, gradually improving  # Hypokalemia, potassium repleted. Monitor electrolytes and replete as needed.  # Hypertension, presented with orthostatic hypotension, syncope and fall Held lisinopril for now Monitor BP and titrate medications accordingly Target to keep systolic BP 140-150 mmHg to prevent orthostatic hypotension and falls in future.  Patient was advised to monitor BP at home multiple times and will resume lisinopril if systolic BP greater than 150 mmHg.   # CAD s/p CABG, history of CVA (ICH in 2018, small lacunar previously as well)  Hyperlipidemia - CT did not show acute bleeding - Continue asa, plavix, statin  # Hypothyroid, continue Synthroid # Multiple sclerosis, recommend to follow-up with neurology as an outpatient # Seizure disorder, patient is on Lamictal 25 mg p.o. daily, Trileptal  150 mg p.o. twice daily # Depression, continued Seroquel and Lexapro home dose  Body mass index is 31.22 kg/m.  Interventions:       Diet: Heart healthy diet DVT Prophylaxis: Subcutaneous Lovenox   Advance goals of care discussion: Full code  Family Communication: family was not present at bedside, at the time of interview.  The pt provided permission to discuss medical plan with the family. Opportunity was given to ask question and all questions were answered satisfactorily.   Disposition:  Pt is from Home, admitted with syncope, developed AKI and hypokalemia, still has elevated creatinine, on IV fluids, which precludes a safe discharge. Discharge to home, when clinically stable.  Subjective: No significant events overnight, currently patient denies any pain, no chest pain or palpitations, no shortness of breath.  Denies any dizziness.   Physical Exam: General: NAD, lying comfortably Appear in no distress, affect appropriate Eyes: PERRLA, Right lateral eyebrow laceration s/p stitches ENT: Oral Mucosa Clear, moist  Neck: no JVD,  Cardiovascular: S1 and S2 Present, no Murmur,  Respiratory: good respiratory effort, Bilateral Air entry equal and Decreased, no Crackles, no wheezes Abdomen: Bowel Sound present, Soft and no tenderness,  Skin: no rashes Extremities: no Pedal edema, no calf tenderness Neurologic: without any new focal findings Gait not checked due to patient safety concerns  Vitals:   10/12/22 1958 10/13/22 0006 10/13/22 0427 10/13/22 0700  BP: 113/65 (!) 140/79 134/81 130/80  Pulse: 83 95 88 84  Resp: 14 18 19 18   Temp: 98.4 F (36.9 C) 98 F (36.7 C) 97.9 F (36.6 C) 97.9 F (36.6 C)  TempSrc:   Oral Oral  SpO2: 100% 100% 100%   Weight:      Height:        Intake/Output Summary (Last 24 hours) at 10/13/2022 1545 Last data filed at 10/13/2022 1518 Gross per 24 hour  Intake 1456.52 ml  Output 900 ml  Net 556.52 ml   Filed Weights   10/12/22 1631  10/12/22 1643 10/12/22 1700  Weight: 104.4 kg 104.4 kg 104.4 kg    Data Reviewed: I have personally reviewed and interpreted daily labs, tele strips, imagings as discussed above. I reviewed all nursing notes, pharmacy notes, vitals, pertinent old records I have discussed plan of care as described above with RN and patient/family.  CBC: Recent Labs  Lab 10/12/22 0937 10/12/22 1726 10/12/22 1727 10/13/22 0542  WBC 11.2* 10.1 10.1 6.7  NEUTROABS  --  6.7 THIS TEST WAS ORDERED IN ERROR AND HAS BEEN CREDITED.  --   HGB 14.6 14.1 14.1 12.6*  HCT 46.1 42.2 41.9 37.8*  MCV 97.7 92.5 92.3 92.6  PLT 504* 506* 477* 374   Basic Metabolic Panel: Recent Labs  Lab 10/12/22 0937 10/12/22 1727 10/13/22 0542  NA 136 135 138  K 3.6 3.0* 3.3*  CL 107 100 106  CO2 17* 25 24  GLUCOSE 83 127* 99  BUN 28* 29* 25*  CREATININE 2.57* 2.46* 1.72*  CALCIUM 8.8* 9.4 9.2    Studies: No results found.  Scheduled Meds:  aspirin EC  81 mg Oral Daily   atorvastatin  80 mg Oral QHS   clopidogrel  75 mg Oral Daily   cyanocobalamin  1,000 mcg Oral Daily   docusate sodium  100 mg Oral BID   enoxaparin (LOVENOX) injection  50 mg Subcutaneous Q24H   escitalopram  20 mg Oral Daily   folic acid  500 mcg Oral Daily   levothyroxine  25 mcg Oral Q0600   montelukast  10 mg Oral Daily   OXcarbazepine  150 mg Oral BID   QUEtiapine  100 mg Oral QHS   Continuous Infusions:  sodium chloride 50 mL/hr at 10/13/22 1518   PRN Meds: acetaminophen, bisacodyl, oxyCODONE, tiZANidine  Time spent: 35 minutes  Author: Gillis Santa. MD [facility] 10/13/2022 3:45 PM  To reach On-call, see care teams to locate the attending and reach out to them via [email or web address]. If 7PM-7AM, please contact night-coverage If you still have difficulty reaching the attending provider, please page the [facility] (Director on Call) for [facility] on amion for assistance.

## 2022-10-13 NOTE — Plan of Care (Signed)

## 2022-10-14 LAB — ECHOCARDIOGRAM COMPLETE
AR max vel: 3 cm2
AV Area VTI: 2.91 cm2
AV Area mean vel: 3.32 cm2
AV Mean grad: 2 mmHg
AV Peak grad: 4.3 mmHg
Ao pk vel: 1.04 m/s
Area-P 1/2: 4.26 cm2
Height: 72 in
MV VTI: 2.6 cm2
S' Lateral: 4.5 cm
Weight: 3682.56 oz

## 2022-10-14 LAB — CBC
HCT: 35.2 % — ABNORMAL LOW (ref 39.0–52.0)
Hemoglobin: 11.8 g/dL — ABNORMAL LOW (ref 13.0–17.0)
MCH: 31.1 pg (ref 26.0–34.0)
MCHC: 33.5 g/dL (ref 30.0–36.0)
MCV: 92.6 fL (ref 80.0–100.0)
Platelets: 363 10*3/uL (ref 150–400)
RBC: 3.8 MIL/uL — ABNORMAL LOW (ref 4.22–5.81)
RDW: 13.7 % (ref 11.5–15.5)
WBC: 6.1 10*3/uL (ref 4.0–10.5)
nRBC: 0 % (ref 0.0–0.2)

## 2022-10-14 LAB — VITAMIN B12: Vitamin B-12: 259 pg/mL (ref 180–914)

## 2022-10-14 LAB — PHOSPHORUS: Phosphorus: 3.2 mg/dL (ref 2.5–4.6)

## 2022-10-14 LAB — FOLATE: Folate: 12.1 ng/mL (ref >5.9)

## 2022-10-14 LAB — BASIC METABOLIC PANEL
Anion gap: 5 (ref 5–15)
BUN: 17 mg/dL (ref 6–20)
CO2: 26 mmol/L (ref 22–32)
Calcium: 8.9 mg/dL (ref 8.9–10.3)
Chloride: 105 mmol/L (ref 98–111)
Creatinine, Ser: 1.3 mg/dL — ABNORMAL HIGH (ref 0.61–1.24)
GFR, Estimated: >60 mL/min (ref >60)
Glucose, Bld: 102 mg/dL — ABNORMAL HIGH (ref 70–99)
Potassium: 3.7 mmol/L (ref 3.5–5.1)
Sodium: 136 mmol/L (ref 135–145)

## 2022-10-14 LAB — MAGNESIUM: Magnesium: 1.9 mg/dL (ref 1.7–2.4)

## 2022-10-14 NOTE — TOC Transition Note (Signed)
Transition of Care [facility]) - CM/SW Discharge Note   Patient Details  Name: Locryn Biggers MRN: [id number] Date of Birth: April 27, 1966  [facility] CM/SW Contact:  Allena Katz, LCSW Phone Number: 10/14/2022, 11:13 AM   Clinical Narrative:   Pt has orders to discharge home. Pt agreeable to [facility] and reports no preference. Pt active with [facility] for Aide services through the [facility] Monday 8-3 and Friday 9-3. [facility] contacted to let them know he was discharging. Pt reports he ambulates with a RW and has a BSC, rollator and wheelchair at home and has no other DME needs. Pt is active with [facility] for primary care. Pt reports no barriers to transportation as he actively is driving to appointments. Barbara Cower with [facility] accepted for [facility] PT/OT/AID.           Patient Goals and CMS Choice      Discharge Placement                         Discharge Plan and Services Additional resources added to the After Visit Summary for                                       Social Determinants of Health (SDOH) Interventions SDOH Screenings   Food Insecurity: No Food Insecurity (10/12/2022)  Housing: Low Risk  (10/12/2022)  Transportation Needs: No Transportation Needs (10/12/2022)  Utilities: Not At Risk (10/12/2022)  Tobacco Use: Medium Risk (10/12/2022)     Readmission Risk Interventions     No data to display

## 2022-10-14 NOTE — Discharge Summary (Signed)
[facility] Discharge Summary   Patient: Cody Burns ZOX:[id number]  PCP: [facility]  Date of admission: 10/12/2022   Date of discharge:  10/14/2022     Discharge Diagnoses:  Principal Problem:   AKI (acute kidney injury) (HCC) Active Problems:   Hypertension   Multiple sclerosis, relapsing-remitting (HCC)   Hyperlipidemia LDL goal <70   Syncope   Hypothyroidism   Admitted From: Home Disposition:  Home with home services  Recommendations for Outpatient Follow-up:  F/u with PCP in 1 wk, monitor BP at home, skip the dose of losartan if systolic BP less than 150 mmHg.  Patient presented with orthostatic hypotension, BP improved with IV hydration and renal failure resolved.  Follow-up with PCP to titrate medications accordingly.  Patient was advised to move slowly while changing position from lying to sitting and from sitting to standing and walking.  Continue fall precautions. Follow up LABS/TEST:     Follow-up Information     [facility] Follow up in 1 week(s).   Specialty: General Practice Contact information: [address] [place] [address] [phone]                Diet recommendation: Cardiac diet  Activity: The patient is advised to gradually reintroduce usual activities, as tolerated  Discharge Condition: stable  Code Status: Full code   History of present illness: As per the H and P dictated on admission  Hospital Course:  Cody Burns is a 57 y.o. male with medical history significant of multiple sclerosis, HTN, hypothyroidism, CAD s/p CABG X 4, OSA, seizures as a child who presented for a fall.  He notes a long history of falls and black outs related to his MS.  ED w/up: BP 86/75, HR 88, saturating well on room air Hypokalemia potassium 3.0, AKI creatinine 2.46, mild hyperglycemia glucose 127, CK3 43 normal [facility] hospitalist consulted for admission and further management as below.   Assessment and Plan:   # Syncope  and fall, secondary to orthostatic hypotension Orthostatic BP improved before discharge.  Patient was hydrated with IV fluid.  Held antihypertensive medications during hospital stay.  Patient's symptoms resolved.  Patient was able to ambulate with PT without any symptoms.  Patient was advised to keep blood pressure slightly high systolic BP around 140 to 150 mmHg.  # AKI secondary to hypotension, prerenal. Creatinine 2.46 on admission. S/p IV fluid for hydration.  Creatinine 1.3 today, gradually improved.  Patient was advised to continue oral hydration, follow with PCP to repeat BMP after 1 week. # Hypokalemia, potassium repleted.  # Hypertension, presented with orthostatic hypotension, syncope and fall. Held lisinopril during hospital stay. Target to keep systolic BP 140-150 mmHg to prevent orthostatic hypotension and falls in future.  Patient was advised to monitor BP at home multiple times. resumed lisinopril on discharge. Patient was advised to Monitor BP at home and follow-up with PCP to titrate medications accordingly  # CAD s/p CABG, history of CVA (ICH in 2018, small lacunar previously as well)  Hyperlipidemia. CT did not show acute bleeding. Continue asa, plavix, statin # Hypothyroid, continue Synthroid # Multiple sclerosis, recommend to follow-up with neurology as an outpatient # Seizure disorder, patient is on Lamictal 25 mg p.o. daily, Trileptal 150 mg p.o. twice daily # Depression, continued Seroquel and Lexapro home dose   Body mass index is 31.22 kg/m.  Nutrition Interventions:   - Patient was instructed, not to drive, operate heavy machinery, perform activities at heights, swimming or participation  in water activities or provide baby sitting services while on Pain, Sleep and Anxiety Medications; until his outpatient Physician has advised to do so again.  - Also recommended to not to take more than prescribed Pain, Sleep and Anxiety Medications.  Patient was seen by physical  therapy, who recommended Home health, which was arranged. On the day of the discharge the patient's vitals were stable, and no other acute medical condition were reported by patient. the patient was felt safe to be discharge at Home with Home health.  Consultants: None Procedures: None  Discharge Exam: General: Appear in no distress, no Rash; Oral Mucosa Clear, moist. Cardiovascular: S1 and S2 Present, no Murmur, Respiratory: normal respiratory effort, Bilateral Air entry present and no Crackles, no wheezes Abdomen: Bowel Sound present, Soft and no tenderness, no hernia Extremities: no Pedal edema, no calf tenderness Neurology: alert and oriented to time, place, and person affect appropriate.  Filed Weights   10/12/22 1631 10/12/22 1643 10/12/22 1700  Weight: 104.4 kg 104.4 kg 104.4 kg   Vitals:   10/14/22 0401 10/14/22 0914  BP: 136/86 (!) 142/91  Pulse: 74 76  Resp: 12 14  Temp: 98.5 F (36.9 C) 98 F (36.7 C)  SpO2: 100% 100%    DISCHARGE MEDICATION: Allergies as of 10/14/2022       Reactions   Hydrochlorothiazide W-triamterene Other (See Comments), Palpitations   High blood pressure   Metoprolol Other (See Comments), Palpitations   Exacerbates MS, High blood pressure   Penicillins Other (See Comments)   Sores in mouth Has patient had a PCN reaction causing immediate rash, facial/tongue/throat swelling, SOB or lightheadedness with hypotension: No Has patient had a PCN reaction causing severe rash involving mucus membranes or skin necrosis: No Has patient had a PCN reaction that required hospitalization: No Has patient had a PCN reaction occurring within the last 10 years: No If all of the above answers are "NO", then may proceed with Cephalosporin use. MOUTH SORES   Aripiprazole Other (See Comments)   WORSENED SLEEP WALKING        Medication List     STOP taking these medications    cyclobenzaprine 10 MG tablet Commonly known as: FLEXERIL   gabapentin 300  MG capsule Commonly known as: NEURONTIN       TAKE these medications    aspirin EC 81 MG tablet Take 81 mg by mouth daily.   atorvastatin 80 MG tablet Commonly known as: LIPITOR Take 80 mg by mouth at bedtime.   buPROPion 300 MG 24 hr tablet Commonly known as: WELLBUTRIN XL Take 300 mg by mouth daily.   clopidogrel 75 MG tablet Commonly known as: PLAVIX Take 75 mg by mouth daily.   cyanocobalamin 1000 MCG tablet Commonly known as: VITAMIN B12 Take 1,000 mcg by mouth daily.   docusate sodium 50 MG capsule Commonly known as: COLACE Take 50 mg by mouth 2 (two) times daily.   escitalopram 20 MG tablet Commonly known as: LEXAPRO Take 20 mg by mouth daily.   folic acid 400 MCG tablet Commonly known as: FOLVITE Take 400 mcg by mouth daily.   lamoTRIgine 25 MG tablet Commonly known as: LAMICTAL Take 25 mg by mouth daily.   levothyroxine 25 MCG tablet Commonly known as: SYNTHROID Take 25 mcg by mouth daily before breakfast.   lisinopril 20 MG tablet Commonly known as: ZESTRIL Take 1 tablet (20 mg total) by mouth daily. Skip the dose if systolic BP less than 150 mmHg What changed: additional instructions  montelukast 10 MG tablet Commonly known as: SINGULAIR Take 10 mg by mouth daily.   niacin 500 MG ER tablet Commonly known as: VITAMIN B3 Take 500 mg by mouth at bedtime.   OXcarbazepine 150 MG tablet Commonly known as: TRILEPTAL Take 1 tablet by mouth 2 (two) times daily.   QUEtiapine 100 MG tablet Commonly known as: SEROQUEL Take 100 mg by mouth at bedtime.   tiZANidine 4 MG tablet Commonly known as: ZANAFLEX Take 4 mg by mouth every 8 (eight) hours as needed for muscle spasms.       Allergies  Allergen Reactions   Hydrochlorothiazide W-Triamterene Other (See Comments) and Palpitations    High blood pressure   Metoprolol Other (See Comments) and Palpitations    Exacerbates MS, High blood pressure   Penicillins Other (See Comments)    Sores in  mouth  Has patient had a PCN reaction causing immediate rash, facial/tongue/throat swelling, SOB or lightheadedness with hypotension: No  Has patient had a PCN reaction causing severe rash involving mucus membranes or skin necrosis: No  Has patient had a PCN reaction that required hospitalization: No  Has patient had a PCN reaction occurring within the last 10 years: No  If all of the above answers are "NO", then may proceed with Cephalosporin use.  MOUTH SORES   Aripiprazole Other (See Comments)    WORSENED SLEEP WALKING   Discharge Instructions     Call MD for:  difficulty breathing, headache or visual disturbances   Complete by: As directed    Call MD for:  extreme fatigue   Complete by: As directed    Call MD for:  persistant dizziness or light-headedness   Complete by: As directed    Call MD for:  severe uncontrolled pain   Complete by: As directed    Diet - low sodium heart healthy   Complete by: As directed    Discharge instructions   Complete by: As directed    F/u with PCP in 1 wk, monitor BP at home, skip the dose of losartan if systolic BP less than 150 mmHg.  Patient presented with orthostatic hypotension, BP improved with IV hydration and renal failure resolved.  Follow-up with PCP to titrate medications accordingly.  Patient was advised to move slowly while changing position from lying to sitting and from sitting to standing and walking.  Continue fall precautions.   Increase activity slowly   Complete by: As directed    No wound care   Complete by: As directed        The results of significant diagnostics from this hospitalization (including imaging, microbiology, ancillary and laboratory) are listed below for reference.    Significant Diagnostic Studies: ECHOCARDIOGRAM COMPLETE  Result Date: 10/14/2022    ECHOCARDIOGRAM REPORT   Patient Name:   WYETT FORCUCCI Date of Exam: 10/13/2022 Medical Rec #:  [id number]     Height:       72.0 in Accession #:     [id number]    Weight:       230.2 lb Date of Birth:  Dec 19, 1965     BSA:          2.262 m Patient Age:    56 years      BP:           134/81 mmHg Patient Gender: M             HR:           86 bpm. Exam Location:  [facility] Procedure: 2D Echo, Cardiac Doppler and Color Doppler Indications:     Syncope  History:         Patient has no prior history of Echocardiogram examinations.                  Previous Myocardial Infarction, Prior CABG, Stroke,                  Signs/Symptoms:Syncope; Risk Factors:Hypertension and                  Dyslipidemia.  Sonographer:     Mikki Harbor Referring Phys:  [id number] Inez Catalina Diagnosing Phys: Alwyn Pea MD IMPRESSIONS  1. Left ventricular ejection fraction, by estimation, is 50 to 55%. The left ventricle has low normal function. The left ventricle has no regional wall motion abnormalities. The left ventricular internal cavity size was moderately dilated. There is moderate concentric left ventricular hypertrophy. Left ventricular diastolic parameters are consistent with Grade I diastolic dysfunction (impaired relaxation).  2. Right ventricular systolic function is normal. The right ventricular size is normal.  3. The mitral valve is grossly normal. Mild to moderate mitral valve regurgitation.  4. The aortic valve is calcified. Aortic valve regurgitation is not visualized. Aortic valve sclerosis is present, with no evidence of aortic valve stenosis. FINDINGS  Left Ventricle: Left ventricular ejection fraction, by estimation, is 50 to 55%. The left ventricle has low normal function. The left ventricle has no regional wall motion abnormalities. The left ventricular internal cavity size was moderately dilated. There is moderate concentric left ventricular hypertrophy. Left ventricular diastolic parameters are consistent with Grade I diastolic dysfunction (impaired relaxation). Right Ventricle: The right ventricular size is normal. No increase in right ventricular wall  thickness. Right ventricular systolic function is normal. Left Atrium: Left atrial size was normal in size. Right Atrium: Right atrial size was normal in size. Pericardium: There is no evidence of pericardial effusion. Mitral Valve: The mitral valve is grossly normal. There is mild thickening of the mitral valve leaflet(s). There is mild calcification of the mitral valve leaflet(s). Normal mobility of the mitral valve leaflets. Mild to moderate mitral valve regurgitation. MV peak gradient, 2.4 mmHg. The mean mitral valve gradient is 1.0 mmHg. Tricuspid Valve: The tricuspid valve is normal in structure. Tricuspid valve regurgitation is trivial. Aortic Valve: The aortic valve is calcified. Aortic valve regurgitation is not visualized. Aortic valve sclerosis is present, with no evidence of aortic valve stenosis. Aortic valve mean gradient measures 2.0 mmHg. Aortic valve peak gradient measures 4.3  mmHg. Aortic valve area, by VTI measures 2.91 cm. Pulmonic Valve: The pulmonic valve was normal in structure. Pulmonic valve regurgitation is not visualized. Aorta: The ascending aorta was not well visualized. IAS/Shunts: No atrial level shunt detected by color flow Doppler.  LEFT VENTRICLE PLAX 2D LVIDd:         5.40 cm   Diastology LVIDs:         4.50 cm   LV e' medial:    5.66 cm/s LV PW:         1.30 cm   LV E/e' medial:  10.1 LV IVS:        1.40 cm   LV e' lateral:   7.62 cm/s LVOT diam:     2.10 cm   LV E/e' lateral: 7.5 LV SV:         51 LV SV Index:   22 LVOT Area:     3.46 cm  RIGHT VENTRICLE RV Basal diam:  3.00 cm RV Mid diam:    2.30 cm RV S prime:     8.49 cm/s LEFT ATRIUM           Index        RIGHT ATRIUM           Index LA diam:      3.80 cm 1.68 cm/m   RA Area:     14.90 cm LA Vol (A2C): 61.4 ml 27.15 ml/m  RA Volume:   37.60 ml  16.63 ml/m LA Vol (A4C): 50.6 ml 22.37 ml/m  AORTIC VALVE                    PULMONIC VALVE AV Area (Vmax):    3.00 cm     PV Vmax:       0.90 m/s AV Area (Vmean):   3.32  cm     PV Peak grad:  3.3 mmHg AV Area (VTI):     2.91 cm AV Vmax:           104.00 cm/s AV Vmean:          58.800 cm/s AV VTI:            0.174 m AV Peak Grad:      4.3 mmHg AV Mean Grad:      2.0 mmHg LVOT Vmax:         90.20 cm/s LVOT Vmean:        56.300 cm/s LVOT VTI:          0.146 m LVOT/AV VTI ratio: 0.84  AORTA Ao Root diam: 3.70 cm MITRAL VALVE MV Area (PHT): 4.26 cm    SHUNTS MV Area VTI:   2.60 cm    Systemic VTI:  0.15 m MV Peak grad:  2.4 mmHg    Systemic Diam: 2.10 cm MV Mean grad:  1.0 mmHg MV Vmax:       0.77 m/s MV Vmean:      45.3 cm/s MV Decel Time: 178 msec MV E velocity: 57.10 cm/s MV A velocity: 70.70 cm/s MV E/A ratio:  0.81 Dwayne D Callwood MD Electronically signed by Alwyn Pea MD Signature Date/Time: 10/14/2022/2:27:54 PM    Final    CT Head Wo Contrast  Result Date: 10/12/2022 CLINICAL DATA:  Status post fall. EXAM: CT HEAD WITHOUT CONTRAST TECHNIQUE: Contiguous axial images were obtained from the base of the skull through the vertex without intravenous contrast. RADIATION DOSE REDUCTION: This exam was performed according to the departmental dose-optimization program which includes automated exposure control, adjustment of the mA and/or kV according to patient size and/or use of iterative reconstruction technique. COMPARISON:  06/05/2017 FINDINGS: Brain: No evidence of acute infarction, hemorrhage, hydrocephalus, extra-axial collection or mass lesion/mass effect. Chronic left basal ganglia lacunar infarcts are identified with ex vacuo dilatation of the anterior horn of the left lateral ventricle. There is low-attenuation within the subcortical and periventricular white matter compatible with chronic microvascular disease. Vascular: No hyperdense vessel or unexpected calcification. Skull: Previous left parietal craniotomy defect. Negative for acute fracture or focal lesion. Sinuses/Orbits: No acute abnormality. Other: None. IMPRESSION: 1. No acute intracranial abnormalities.  2. Chronic left basal ganglia lacunar infarcts with ex vacuo dilatation of the anterior horn of the left lateral ventricle. 3. Chronic microvascular disease. Electronically Signed   By: Signa Kell M.D.   On: 10/12/2022 11:13   DG Chest 2 View  Result Date: 10/12/2022 CLINICAL DATA:  57 year old male status post fall last night. On blood thinners. Right side pain. EXAM: CHEST - 2 VIEW COMPARISON:  Chest radiographs 07/26/2017. FINDINGS: AP seated and lateral views of the chest at 0956 hours. Chronic CABG. Normal cardiac size and mediastinal contours. Lung volumes remain normal. Both lungs appear clear. No pneumothorax or pleural effusion. Chronic sternotomy. Subtle fractures of the right anterior 5th and 7th ribs, although probably chronic and 7th rib deformity identified last time. No acute osseous abnormality identified. Paucity of bowel gas in the upper abdomen. Visualized tracheal air column is within normal limits. IMPRESSION: No acute cardiopulmonary abnormality or acute traumatic injury identified. Electronically Signed   By: Odessa Fleming M.D.   On: 10/12/2022 10:13   DG Lumbar Spine Complete  Result Date: 09/23/2022 CLINICAL DATA:  Fall EXAM: LUMBAR SPINE - COMPLETE 4+ VIEW COMPARISON:  None Available. FINDINGS: No evidence for lumbar spine fracture. Alignment is anatomic. Disc spaces are maintained. Minimal degenerative endplate osteophytes are seen throughout the lumbar spine. Left hip arthroplasty appears grossly within normal limits. IMPRESSION: No evidence for fracture. Minimal degenerative changes. Electronically Signed   By: Darliss Cheney M.D.   On: 09/23/2022 19:45   DG Hip Unilat W or Wo Pelvis 2-3 Views Left  Result Date: 09/23/2022 CLINICAL DATA:  Fall EXAM: DG HIP (WITH OR WITHOUT PELVIS) 2-3V LEFT COMPARISON:  None Available. FINDINGS: Left hip arthroplasty is present in anatomic alignment. No evidence for hardware loosening. No acute fracture. Joint spaces are maintained. IMPRESSION:  Left hip arthroplasty in anatomic alignment. Electronically Signed   By: Darliss Cheney M.D.   On: 09/23/2022 19:41    Microbiology: No results found for this or any previous visit (from the past 240 hour(s)).   Labs: CBC: Recent Labs  Lab 10/12/22 0937 10/12/22 1726 10/12/22 1727 10/13/22 0542 10/14/22 0350  WBC 11.2* 10.1 10.1 6.7 6.1  NEUTROABS  --  6.7 THIS TEST WAS ORDERED IN ERROR AND HAS BEEN CREDITED.  --   --   HGB 14.6 14.1 14.1 12.6* 11.8*  HCT 46.1 42.2 41.9 37.8* 35.2*  MCV 97.7 92.5 92.3 92.6 92.6  PLT 504* 506* 477* 374 363   Basic Metabolic Panel: Recent Labs  Lab 10/12/22 0937 10/12/22 1727 10/13/22 0542 10/14/22 0350  NA 136 135 138 136  K 3.6 3.0* 3.3* 3.7  CL 107 100 106 105  CO2 17* 25 24 26   GLUCOSE 83 127* 99 102*  BUN 28* 29* 25* 17  CREATININE 2.57* 2.46* 1.72* 1.30*  CALCIUM 8.8* 9.4 9.2 8.9  MG  --   --   --  1.9  PHOS  --   --   --  3.2   Liver Function Tests: No results for input(s): "AST", "ALT", "ALKPHOS", "BILITOT", "PROT", "ALBUMIN" in the last 168 hours. No results for input(s): "LIPASE", "AMYLASE" in the last 168 hours. No results for input(s): "AMMONIA" in the last 168 hours. Cardiac Enzymes: Recent Labs  Lab 10/12/22 1830  CKTOTAL 343   BNP (last 3 results) No results for input(s): "BNP" in the last 8760 hours. CBG: No results for input(s): "GLUCAP" in the last 168 hours.  Time spent: 35 minutes  Signed:  Gillis Santa  [facility] 10/14/2022 2:40 PM

## 2022-10-14 NOTE — Evaluation (Signed)
Occupational Therapy Evaluation Patient Details Name: Cody Burns MRN: [id number] DOB: 11-21-1965 Today's Date: 10/14/2022   History of Present Illness 57 y.o. male with medical history significant of multiple sclerosis, HTN, hypothyroidism, CAD s/p CABG X 4, OSA, seizures as a child who presented for a fall.  He notes a long history of falls and black outs related to his MS.   Clinical Impression   Patient presenting with decreased Ind in self care,balance, functional mobility/transfer, endurance, and safety awareness. Patient reports living at home alone with use of rollator for mobility. Pt has PCA that comes 2x wk for 5-6 hours that assists with self care and IADL tasks. Pt does endorse 15+ falls this year. Patient currently functioning at min guard progressing to supervision with use of RW for functional mobility and toileting needs within the room. Patient will benefit from acute OT to increase overall independence in the areas of ADLs, functional mobility, and safety awareness in order to safely discharge.     Recommendations for follow up therapy are one component of a multi-disciplinary discharge planning process, led by the attending physician.  Recommendations may be updated based on patient status, additional functional criteria and insurance authorization.   Assistance Recommended at Discharge Intermittent Supervision/Assistance  Patient can return home with the following A little help with bathing/dressing/bathroom;Assistance with cooking/housework;Assist for transportation;Help with stairs or ramp for entrance    Functional Status Assessment  Patient has had a recent decline in their functional status and demonstrates the ability to make significant improvements in function in a reasonable and predictable amount of time.  Equipment Recommendations  None recommended by OT       Precautions / Restrictions Precautions Precautions: Fall      Mobility Bed Mobility Overal bed  mobility: Needs Assistance Bed Mobility: Sit to Supine, Supine to Sit     Supine to sit: Supervision Sit to supine: Supervision        Transfers Overall transfer level: Needs assistance Equipment used: Rolling walker (2 wheels) Transfers: Sit to/from Stand Sit to Stand: Supervision, Min guard                  Balance Overall balance assessment: Needs assistance Sitting-balance support: Feet supported Sitting balance-Leahy Scale: Good     Standing balance support: Reliant on assistive device for balance, During functional activity, Bilateral upper extremity supported Standing balance-Leahy Scale: Fair                             ADL either performed or assessed with clinical judgement   ADL Overall ADL's : Needs assistance/impaired                                       General ADL Comments: min  guard progressing to supervision for ambulation to bathroom with RW and toileting needs.     Vision Baseline Vision/History: 1 Wears glasses Patient Visual Report: No change from baseline              Pertinent Vitals/Pain Pain Assessment Pain Assessment: No/denies pain     Hand Dominance     Extremity/Trunk Assessment Upper Extremity Assessment Upper Extremity Assessment: Overall WFL for tasks assessed;Generalized weakness   Lower Extremity Assessment Lower Extremity Assessment: Generalized weakness       Communication Communication Communication: No difficulties   Cognition Arousal/Alertness: Awake/alert Behavior During Therapy:  WFL for tasks assessed/performed Overall Cognitive Status: Within Functional Limits for tasks assessed                                                  Home Living Family/patient expects to be discharged to:: Private residence Living Arrangements: Alone   Type of Home: House Home Access: Stairs to enter [employer] of Steps: 1   Home Layout: One level      Bathroom Shower/Tub: Tub/shower unit         Home Equipment: Agricultural consultant (2 wheels);Rollator (4 wheels);Shower seat - built in;Electric scooter;Cane - single point;BSC/3in1          Prior Functioning/Environment Prior Level of Function : Needs assist;Driving;History of Falls (last six months)               ADLs Comments: Pt reports PCA comes 2x/wk for 5-6 hours to assist with IADLs and shower. Pt endorses being mod I with use of rollator for all other needs during the week including driving. 15+ falls this year reported.        OT Problem List: Decreased strength;Decreased activity tolerance;Decreased safety awareness;Impaired balance (sitting and/or standing);Decreased knowledge of use of DME or AE      OT Treatment/Interventions: Self-care/ADL training;Therapeutic exercise;Therapeutic activities;Manual therapy;Balance training;Patient/family education;Energy conservation    OT Goals(Current goals can be found in the care plan section) Acute Rehab OT Goals Patient Stated Goal: to get stronger OT Goal Formulation: With patient Time For Goal Achievement: 10/28/22 Potential to Achieve Goals: Fair ADL Goals Pt Will Perform Grooming: with modified independence;standing Pt Will Transfer to Toilet: with modified independence;ambulating Pt Will Perform Toileting - Clothing Manipulation and hygiene: with modified independence;sit to/from stand Pt/caregiver will Perform Home Exercise Program: Increased strength;Both right and left upper extremity;With theraband;With written HEP provided;Independently  OT Frequency: Min 2X/week       AM-PAC OT "6 Clicks" Daily Activity     Outcome Measure Help from another person eating meals?: None Help from another person taking care of personal grooming?: None Help from another person toileting, which includes using toliet, bedpan, or urinal?: A Little Help from another person bathing (including washing, rinsing, drying)?: A Little Help  from another person to put on and taking off regular upper body clothing?: None Help from another person to put on and taking off regular lower body clothing?: A Little 6 Click Score: 21   End of Session Equipment Utilized During Treatment: Rolling walker (2 wheels) Nurse Communication: Mobility status  Activity Tolerance: Patient tolerated treatment well Patient left: in bed;with call bell/phone within reach;with bed alarm set  OT Visit Diagnosis: Unsteadiness on feet (R26.81);Repeated falls (R29.6);Muscle weakness (generalized) (M62.81)                Time: 4098-1191 OT Time Calculation (min): 20 min Charges:  OT General Charges $OT Visit: 1 Visit OT Evaluation $OT Eval Low Complexity: 1 Low OT Treatments $Self Care/Home Management : 8-22 mins Jackquline Denmark, MS, OTR/L , CBIS ascom [phone]  10/14/22, 1:03 PM

## 2022-10-14 NOTE — Progress Notes (Signed)
Mobility Specialist - Progress Note    10/14/22 1115  Mobility  Activity Ambulated with assistance in hallway  Level of Assistance Standby assist, set-up cues, supervision of patient - no hands on  Assistive Device Front wheel walker  Distance Ambulated (ft) 40 ft  Range of Motion/Exercises Active  Activity Response Tolerated well  Mobility Referral Yes  $Mobility charge 1 Mobility  Mobility Specialist Start Time (ACUTE ONLY) 1050  Mobility Specialist Stop Time (ACUTE ONLY) 1115  Mobility Specialist Time Calculation (min) (ACUTE ONLY) 25 min   Pt resting EOB upon entry on RA upon entry. Pt STS and ambulation to hallway SBA with RW. Pt endorses a little dizziness but, gait unaffected. Pt dizziness decreased on returned back to bed. Pt returned to bed and left with needs in reach and bed alarm activated. RN Notified of dizziness.   Johnathan Hausen Mobility Specialist 10/14/22, 11:25 AM

## 2022-10-14 NOTE — Evaluation (Signed)
Physical Therapy Evaluation Patient Details Name: Cody Burns MRN: [id number] DOB: 08-Feb-1966 Today's Date: 10/14/2022  History of Present Illness  57 y.o. male with medical history significant of multiple sclerosis, HTN, hypothyroidism, CAD s/p CABG X 4, OSA, seizures as a child who presented for a fall.  He notes a long history of falls and black outs related to his MS; admitted for management of AKI, HTN and syncope.  Clinical Impression  Patient resting in bed upon arrival to room; oriented, follows commands and agreeable to participation with session.  Denies pain; does endorse generalized weakness due to acute illness ("about 50% of my normal").  Bilat UE/LE strength and ROM grossly symmetrical and WFL; no focal weakness appreciated.  Does report baseline paresthesia to bilat UEs/LEs; unchanged with this admission.   Able to complete bed mobility with mod indep; sit/stand, basic transfers and gait (200') with rollator, cga. Demonstrates reciprocal stepping pattern with limited step height/length, mildly antalgic due to closed-chain R LE weakness; excessive R lateral weight shift, but no overt buckling/LOB Does demonstrate mild BP drop with transition to upright, but denies subjective symptoms; verbalizes good awareness/understanding of compensatory techniques/strategies to manage. Would benefit from skilled PT to address above deficits and promote optimal return to PLOF.; recommend post-acute PT follow up as indicated by interdisciplinary care team.         Recommendations for follow up therapy are one component of a multi-disciplinary discharge planning process, led by the attending physician.  Recommendations Cody be updated based on patient status, additional functional criteria and insurance authorization.  Follow Up Recommendations       Assistance Recommended at Discharge PRN  Patient can return home with the following  A little help with walking and/or transfers;A little help with  bathing/dressing/bathroom    Equipment Recommendations    Recommendations for Other Services       Functional Status Assessment Patient has had a recent decline in their functional status and demonstrates the ability to make significant improvements in function in a reasonable and predictable amount of time.     Precautions / Restrictions Precautions Precautions: Fall Restrictions Weight Bearing Restrictions: No      Mobility  Bed Mobility Overal bed mobility: Modified Independent                  Transfers Overall transfer level: Needs assistance Equipment used: Rolling walker (2 wheels) Transfers: Sit to/from Stand Sit to Stand: Min guard           General transfer comment: requires multiple attempts and use of bilat LEs; difficulty with anterior weight translation during movement transition    Ambulation/Gait Ambulation/Gait assistance: Min guard Gait Distance (Feet): 200 Feet Assistive device: Rolling walker (2 wheels)         General Gait Details: reciprocal stepping pattern with limited step height/length, mildly antalgic due to closed-chain R LE weakness; excessive R lateral weight shift, but no overt buckling/LOB  Stairs            Wheelchair Mobility    Modified Rankin (Stroke Patients Only)       Balance Overall balance assessment: Needs assistance Sitting-balance support: No upper extremity supported, Feet supported Sitting balance-Leahy Scale: Good     Standing balance support: Bilateral upper extremity supported Standing balance-Leahy Scale: Fair                               Pertinent Vitals/Pain Pain Assessment Pain Assessment:  No/denies pain    Home Living Family/patient expects to be discharged to:: Private residence Living Arrangements: Alone Available Help at Discharge: Family;Personal care attendant (PCA 2 days/week; brother provides check in) Type of Home: House Home Access: Stairs to enter    [employer] of Steps: 1   Home Layout: One level Home Equipment: Agricultural consultant (2 wheels);Rollator (4 wheels);Shower seat - built in;Electric scooter;Cane - single point;BSC/3in1      Prior Function Prior Level of Function : Needs assist;Driving;History of Falls (last six months)             Mobility Comments: Sup/mod indep with rollator for in house, limited community distances ADLs Comments: Pt reports PCA comes 2x/wk for 5-6 hours to assist with IADLs and shower. Pt endorses being mod I with use of rollator for all other needs during the week including driving. 15+ falls this year reported.     Hand Dominance        Extremity/Trunk Assessment   Upper Extremity Assessment Upper Extremity Assessment: Generalized weakness (grossly 4/5 throughout; baseline paresthesia elbows distally)    Lower Extremity Assessment Lower Extremity Assessment: Generalized weakness (grossly 4/5 throughout; baseline paresthesia bialt knees distally)    Cervical / Trunk Assessment Cervical / Trunk Assessment:  (R lateral lean, mild R lateral cervical flexion)  Communication   Communication: No difficulties  Cognition Arousal/Alertness: Awake/alert Behavior During Therapy: WFL for tasks assessed/performed Overall Cognitive Status: Within Functional Limits for tasks assessed                                          General Comments      Exercises     Assessment/Plan    PT Assessment Patient needs continued PT services  PT Problem List Decreased activity tolerance;Decreased balance;Decreased mobility;Decreased coordination;Decreased knowledge of use of DME;Decreased safety awareness;Decreased knowledge of precautions       PT Treatment Interventions DME instruction;Gait training;Stair training;Functional mobility training;Therapeutic activities;Therapeutic exercise;Balance training;Neuromuscular re-education;Patient/family education    PT Goals (Current  goals can be found in the Care Plan section)  Acute Rehab PT Goals Patient Stated Goal: to return home PT Goal Formulation: With patient/family Time For Goal Achievement: 10/28/22 Potential to Achieve Goals: Good    Frequency Min 2X/week     Co-evaluation               AM-PAC PT "6 Clicks" Mobility  Outcome Measure Help needed turning from your back to your side while in a flat bed without using bedrails?: None Help needed moving from lying on your back to sitting on the side of a flat bed without using bedrails?: None Help needed moving to and from a bed to a chair (including a wheelchair)?: A Little Help needed standing up from a chair using your arms (e.g., wheelchair or bedside chair)?: A Little Help needed to walk in hospital room?: A Little Help needed climbing 3-5 steps with a railing? : A Little 6 Click Score: 20    End of Session Equipment Utilized During Treatment: Gait belt Activity Tolerance: Patient tolerated treatment well Patient left: in bed;with call bell/phone within reach;with bed alarm set Nurse Communication: Mobility status PT Visit Diagnosis: Muscle weakness (generalized) (M62.81);Difficulty in walking, not elsewhere classified (R26.2)    Time: 1610-9604 PT Time Calculation (min) (ACUTE ONLY): 28 min   Charges:   PT Evaluation $PT Eval Moderate Complexity: 1 Mod  Linette Gunderson H. Manson Passey, PT, DPT, NCS 10/14/22, 1:33 PM [phone]

## 2022-10-16 ENCOUNTER — Emergency Department: Payer: No Typology Code available for payment source

## 2022-10-16 ENCOUNTER — Other Ambulatory Visit: Payer: Self-pay

## 2022-10-16 ENCOUNTER — Observation Stay
Admission: EM | Admit: 2022-10-16 | Discharge: 2022-10-18 | Disposition: A | Discharge status: Home Health | Payer: No Typology Code available for payment source | Attending: Family Medicine | Admitting: Family Medicine

## 2022-10-16 DIAGNOSIS — Z7982 Long term (current) use of aspirin: Secondary | ICD-10-CM | POA: Insufficient documentation

## 2022-10-16 DIAGNOSIS — E039 Hypothyroidism, unspecified: Secondary | ICD-10-CM | POA: Diagnosis not present

## 2022-10-16 DIAGNOSIS — Z7902 Long term (current) use of antithrombotics/antiplatelets: Secondary | ICD-10-CM | POA: Diagnosis not present

## 2022-10-16 DIAGNOSIS — I2510 Atherosclerotic heart disease of native coronary artery without angina pectoris: Secondary | ICD-10-CM | POA: Diagnosis not present

## 2022-10-16 DIAGNOSIS — F1020 Alcohol dependence, uncomplicated: Secondary | ICD-10-CM | POA: Diagnosis present

## 2022-10-16 DIAGNOSIS — I2489 Other forms of acute ischemic heart disease: Secondary | ICD-10-CM | POA: Diagnosis not present

## 2022-10-16 DIAGNOSIS — Z951 Presence of aortocoronary bypass graft: Secondary | ICD-10-CM | POA: Insufficient documentation

## 2022-10-16 DIAGNOSIS — G35 Multiple sclerosis: Secondary | ICD-10-CM

## 2022-10-16 DIAGNOSIS — N179 Acute kidney failure, unspecified: Secondary | ICD-10-CM

## 2022-10-16 DIAGNOSIS — Z79899 Other long term (current) drug therapy: Secondary | ICD-10-CM | POA: Insufficient documentation

## 2022-10-16 DIAGNOSIS — R7989 Other specified abnormal findings of blood chemistry: Secondary | ICD-10-CM | POA: Insufficient documentation

## 2022-10-16 DIAGNOSIS — I959 Hypotension, unspecified: Secondary | ICD-10-CM

## 2022-10-16 DIAGNOSIS — Z8673 Personal history of transient ischemic attack (TIA), and cerebral infarction without residual deficits: Secondary | ICD-10-CM | POA: Insufficient documentation

## 2022-10-16 DIAGNOSIS — R9431 Abnormal electrocardiogram [ECG] [EKG]: Secondary | ICD-10-CM | POA: Diagnosis not present

## 2022-10-16 DIAGNOSIS — Z87891 Personal history of nicotine dependence: Secondary | ICD-10-CM | POA: Insufficient documentation

## 2022-10-16 DIAGNOSIS — I10 Essential (primary) hypertension: Secondary | ICD-10-CM | POA: Diagnosis not present

## 2022-10-16 DIAGNOSIS — Z955 Presence of coronary angioplasty implant and graft: Secondary | ICD-10-CM | POA: Insufficient documentation

## 2022-10-16 DIAGNOSIS — F329 Major depressive disorder, single episode, unspecified: Secondary | ICD-10-CM | POA: Insufficient documentation

## 2022-10-16 DIAGNOSIS — F1029 Alcohol dependence with unspecified alcohol-induced disorder: Secondary | ICD-10-CM

## 2022-10-16 DIAGNOSIS — R55 Syncope and collapse: Secondary | ICD-10-CM | POA: Diagnosis not present

## 2022-10-16 DIAGNOSIS — N319 Neuromuscular dysfunction of bladder, unspecified: Secondary | ICD-10-CM | POA: Insufficient documentation

## 2022-10-16 HISTORY — DX: Cerebral infarction, unspecified: I63.9

## 2022-10-16 LAB — CBC
HCT: 40 % (ref 39.0–52.0)
Hemoglobin: 13 g/dL (ref 13.0–17.0)
MCH: 30.6 pg (ref 26.0–34.0)
MCHC: 32.5 g/dL (ref 30.0–36.0)
MCV: 94.1 fL (ref 80.0–100.0)
Platelets: 435 10*3/uL — ABNORMAL HIGH (ref 150–400)
RBC: 4.25 MIL/uL (ref 4.22–5.81)
RDW: 13.7 % (ref 11.5–15.5)
WBC: 6.7 10*3/uL (ref 4.0–10.5)
nRBC: 0 % (ref 0.0–0.2)

## 2022-10-16 LAB — BASIC METABOLIC PANEL
Anion gap: 10 (ref 5–15)
BUN: 17 mg/dL (ref 6–20)
CO2: 24 mmol/L (ref 22–32)
Calcium: 9.5 mg/dL (ref 8.9–10.3)
Chloride: 104 mmol/L (ref 98–111)
Creatinine, Ser: 2.17 mg/dL — ABNORMAL HIGH (ref 0.61–1.24)
GFR, Estimated: 34 mL/min — ABNORMAL LOW (ref >60)
Glucose, Bld: 79 mg/dL (ref 70–99)
Potassium: 3.5 mmol/L (ref 3.5–5.1)
Sodium: 138 mmol/L (ref 135–145)

## 2022-10-16 LAB — HEPATIC FUNCTION PANEL
ALT: 36 U/L (ref 0–44)
AST: 37 U/L (ref 15–41)
Albumin: 3.6 g/dL (ref 3.5–5.0)
Alkaline Phosphatase: 72 U/L (ref 38–126)
Bilirubin, Direct: <0.1 mg/dL (ref 0.0–0.2)
Total Bilirubin: 1 mg/dL (ref 0.3–1.2)
Total Protein: 6.2 g/dL — ABNORMAL LOW (ref 6.5–8.1)

## 2022-10-16 LAB — ETHANOL: Alcohol, Ethyl (B): <10 mg/dL (ref <10)

## 2022-10-16 LAB — TROPONIN I (HIGH SENSITIVITY)
Troponin I (High Sensitivity): 132 ng/L (ref <18)
Troponin I (High Sensitivity): 107 ng/L (ref <18)

## 2022-10-16 MED ORDER — FOLIC ACID 1 MG PO TABS
1.0000 mg | ORAL_TABLET | Freq: Every day | ORAL | Status: DC
  Administered 2022-10-16 – 2022-10-18 (×3): 1 mg via ORAL
  Filled 2022-10-16 (×3): qty 1

## 2022-10-16 MED ORDER — OXCARBAZEPINE 150 MG PO TABS
150.0000 mg | ORAL_TABLET | Freq: Two times a day (BID) | ORAL | Status: DC
  Administered 2022-10-16 – 2022-10-18 (×4): 150 mg via ORAL
  Filled 2022-10-16 (×4): qty 1

## 2022-10-16 MED ORDER — SODIUM CHLORIDE 0.9 % IV BOLUS
1000.0000 mL | Freq: Once | INTRAVENOUS | Status: AC
  Administered 2022-10-16: 1000 mL via INTRAVENOUS

## 2022-10-16 MED ORDER — CLOPIDOGREL BISULFATE 75 MG PO TABS
75.0000 mg | ORAL_TABLET | Freq: Every day | ORAL | Status: DC
  Administered 2022-10-17 – 2022-10-18 (×2): 75 mg via ORAL
  Filled 2022-10-16 (×2): qty 1

## 2022-10-16 MED ORDER — ESCITALOPRAM OXALATE 10 MG PO TABS
20.0000 mg | ORAL_TABLET | Freq: Every day | ORAL | Status: DC
  Administered 2022-10-17 – 2022-10-18 (×2): 20 mg via ORAL
  Filled 2022-10-16 (×2): qty 2

## 2022-10-16 MED ORDER — SODIUM CHLORIDE 0.9% FLUSH
3.0000 mL | Freq: Two times a day (BID) | INTRAVENOUS | Status: DC
  Administered 2022-10-16 – 2022-10-18 (×5): 3 mL via INTRAVENOUS

## 2022-10-16 MED ORDER — ADULT MULTIVITAMIN W/MINERALS CH
1.0000 | ORAL_TABLET | Freq: Every day | ORAL | Status: DC
  Administered 2022-10-16 – 2022-10-18 (×3): 1 via ORAL
  Filled 2022-10-16 (×3): qty 1

## 2022-10-16 MED ORDER — ATORVASTATIN CALCIUM 20 MG PO TABS
80.0000 mg | ORAL_TABLET | Freq: Every day | ORAL | Status: DC
  Administered 2022-10-16 – 2022-10-17 (×2): 80 mg via ORAL
  Filled 2022-10-16 (×2): qty 4

## 2022-10-16 MED ORDER — ONDANSETRON HCL 4 MG/2ML IJ SOLN: 4.0000 mg | Freq: Four times a day (QID) | INTRAMUSCULAR | Status: DC | PRN

## 2022-10-16 MED ORDER — QUETIAPINE FUMARATE 25 MG PO TABS
100.0000 mg | ORAL_TABLET | Freq: Every day | ORAL | Status: DC
  Administered 2022-10-16 – 2022-10-17 (×2): 100 mg via ORAL
  Filled 2022-10-16 (×2): qty 4

## 2022-10-16 MED ORDER — BUPROPION HCL ER (XL) 150 MG PO TB24
300.0000 mg | ORAL_TABLET | Freq: Every day | ORAL | Status: DC
  Administered 2022-10-17 – 2022-10-18 (×2): 300 mg via ORAL
  Filled 2022-10-16 (×2): qty 2

## 2022-10-16 MED ORDER — LAMOTRIGINE 25 MG PO TABS
25.0000 mg | ORAL_TABLET | Freq: Every day | ORAL | Status: DC
  Administered 2022-10-17 – 2022-10-18 (×2): 25 mg via ORAL
  Filled 2022-10-16 (×2): qty 1

## 2022-10-16 MED ORDER — LEVOTHYROXINE SODIUM 50 MCG PO TABS
25.0000 ug | ORAL_TABLET | Freq: Every day | ORAL | Status: DC
  Administered 2022-10-17 – 2022-10-18 (×2): 25 ug via ORAL
  Filled 2022-10-16 (×2): qty 1

## 2022-10-16 MED ORDER — MONTELUKAST SODIUM 10 MG PO TABS
10.0000 mg | ORAL_TABLET | Freq: Every day | ORAL | Status: DC
  Administered 2022-10-17: 10 mg via ORAL
  Filled 2022-10-16: qty 1

## 2022-10-16 MED ORDER — ACETAMINOPHEN 325 MG PO TABS
650.0000 mg | ORAL_TABLET | Freq: Four times a day (QID) | ORAL | Status: DC | PRN
  Administered 2022-10-17: 650 mg via ORAL
  Filled 2022-10-16: qty 2

## 2022-10-16 MED ORDER — ACETAMINOPHEN 650 MG RE SUPP: 650.0000 mg | Freq: Four times a day (QID) | RECTAL | Status: DC | PRN

## 2022-10-16 MED ORDER — THIAMINE MONONITRATE 100 MG PO TABS
100.0000 mg | ORAL_TABLET | Freq: Every day | ORAL | Status: DC
  Administered 2022-10-16 – 2022-10-18 (×3): 100 mg via ORAL
  Filled 2022-10-16 (×3): qty 1

## 2022-10-16 MED ORDER — ENOXAPARIN SODIUM 60 MG/0.6ML IJ SOSY
0.5000 mg/kg | PREFILLED_SYRINGE | INTRAMUSCULAR | Status: DC
  Administered 2022-10-16 – 2022-10-17 (×2): 50 mg via SUBCUTANEOUS
  Filled 2022-10-16 (×2): qty 0.6

## 2022-10-16 MED ORDER — VITAMIN B-12 1000 MCG PO TABS
1000.0000 ug | ORAL_TABLET | Freq: Every day | ORAL | Status: DC
  Administered 2022-10-17 – 2022-10-18 (×2): 1000 ug via ORAL
  Filled 2022-10-16 (×2): qty 1

## 2022-10-16 MED ORDER — ONDANSETRON HCL 4 MG PO TABS: 4.0000 mg | ORAL_TABLET | Freq: Four times a day (QID) | ORAL | Status: DC | PRN

## 2022-10-16 MED ORDER — ASPIRIN 81 MG PO CHEW
324.0000 mg | CHEWABLE_TABLET | Freq: Once | ORAL | Status: AC
  Administered 2022-10-16: 324 mg via ORAL
  Filled 2022-10-16: qty 4

## 2022-10-16 MED ORDER — ASPIRIN 81 MG PO TBEC
81.0000 mg | DELAYED_RELEASE_TABLET | Freq: Every day | ORAL | Status: DC
  Administered 2022-10-17 – 2022-10-18 (×2): 81 mg via ORAL
  Filled 2022-10-16 (×2): qty 1

## 2022-10-16 NOTE — TOC Initial Note (Signed)
Transition of Care [facility]) - Initial/Assessment Note    Patient Details  Name: Cody Burns MRN: [id number] Date of Birth: 04-18-1966  Transition of [facility]) CM/SW Contact:    Darolyn Rua, LCSW Phone Number: 10/16/2022, 11:51 AM  Clinical Narrative:                  Patient is active with [facility]. Patient is coming from home, CSW spoke with patient's brother Kennith Center who reports reason for recent falls has been due to patient's drinking. He reports that patient also cancelled his home health appointment today but they went to the home anyways as they were concerned about patient.   TOC will follow for needs.     Barriers to Discharge: Continued Medical Work up   Patient Goals and CMS Choice            Expected Discharge Plan and Services       Living arrangements for the past 2 months: Single Family Home                                      Prior Living Arrangements/Services Living arrangements for the past 2 months: Single Family Home Lives with:: Self                   Activities of Daily Living      Permission Sought/Granted                  Emotional Assessment              Admission diagnosis:  hypotension Patient Active Problem List   Diagnosis Date Noted   AKI (acute kidney injury) (HCC) 10/12/2022   Hypothyroidism 10/12/2022   Acute renal failure (ARF) (HCC) 06/05/2017   Left hip pain 04/05/2017   Acute kidney injury (HCC) 02/23/2017   Zoster 02/05/2017   Syncope 02/03/2017   Stenosis of carotid artery 01/05/2017   Carotid stenosis, asymptomatic, right 11/16/2016   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   Hx of ischemic left MCA stroke 11/13/2016   Hypertension 11/13/2016   Multiple sclerosis, relapsing-remitting (HCC) 11/13/2016   Carotid occlusion, left 11/13/2016   Hyperlipidemia LDL goal <70 11/13/2016   Slurred speech 11/13/2016   Closed  trimalleolar fracture of right ankle 09/25/2016   Closed right ankle fracture 09/22/2016   PCP:  [facility]:   [facility] - [place], [place] - [address] [place] [address] Phone: [phone] Fax: [phone]  [id number] [place], [place] - [address] AT [facility] OF [address] [place] [address] Phone: [phone] Fax: [phone]  [facility] [address] [place] - [address] [place] [address] Phone: [phone] Fax: [phone]  [facility] [place], [place] - [address] [place] [address] Phone: [phone] Fax: [phone]     Social Determinants of Health (SDOH) Social History: SDOH Screenings   Food Insecurity: No Food Insecurity (10/12/2022)  Housing: Low Risk  (10/12/2022)  Transportation Needs: No Transportation Needs (10/12/2022)  Utilities: Not At Risk (10/12/2022)  Tobacco Use: Medium Risk (10/16/2022)   SDOH Interventions:     Readmission Risk Interventions     No data to display

## 2022-10-16 NOTE — Assessment & Plan Note (Addendum)
Troponin elevation likely secondary to hypotension leading to demand ischemia.  Some minimal ST elevations on EKG, however EDP discussed with STEMI team, who do not feel it is consistent with ACS.  Patient denies any chest pain previously or at this time.  - Telemetry monitoring - Repeat troponin pending - Stat EKG for any chest pain development - Hold off on heparin infusion unless chest pain develops or patient's troponin markedly increases

## 2022-10-16 NOTE — Assessment & Plan Note (Signed)
-   Hold home antihypertensives in the setting of hypotension 

## 2022-10-16 NOTE — ED Provider Notes (Signed)
[facility] Provider Note    Event Date/Time   First MD Initiated Contact with Patient 10/16/22 1103     (approximate)   History   Hypotension   HPI  Cody Burns is a 57 y.o. male here for evaluation after he became lightheaded and passed out briefly.  He reports basically the exact same thing happened when he was admitted a couple days ago.  He felt lightheaded this morning, as he was walking to get up into his kitchen he started feeling very lightheaded.  He has had to get down to the floor and then briefly passed out.  He does not think he fell from any significant distance and was in the process of [place] settle himself up against the wall when he passed out briefly.  Denies any injury.  He has bruises from the fall that he had last time  No chest pain no shortness of breath no headache.  He does report he has not had much appetite.  For the first day when he left the hospital he felt okay but since then he is continue to feel weak lightheaded and has no appetite.  No pain or anything just not eating or drinking much of anything.   Reviewed discharge summary from May 22 of this year.  The patient has a history of multiple sclerosis hypertension hypothyroidism coronary disease patient had an episode of syncope and a fall secondary to orthostatic hypotension for which she was admitted.  At that time his blood pressure medications were withheld, he had acute kidney injury which improved with IV fluids.  Also had hypokalemia  Physical Exam   Triage Vital Signs: ED Triage Vitals [10/16/22 1112]  Enc Vitals Group     BP (!) 82/50     Pulse Rate 74     Resp 18     Temp 98.2 F (36.8 C)     Temp Source Oral     SpO2 100 %     Weight 221 lb 4.8 oz (100.4 kg)     Height 5\' 11"  (1.803 m)     Head Circumference      Peak Flow      Pain Score      Pain Loc      Pain Edu?      Excl. in GC?     Most recent vital signs: Vitals:   10/16/22 1215 10/16/22  1300  BP: 110/62 114/74  Pulse: 75   Resp: 18 19  Temp:    SpO2: 100%      General: Awake, no distress.  Normocephalic atraumatic set for slight bruising and that appears to be old as well as small healing abrasion over the right lateral eyebrow. Extraocular movements are normal.  His speech is clear CV:  Good peripheral perfusion.  Normal tones and rate Resp:  Normal effort.  Clear bilaterally Abd:  No distention.  Soft nontender Other:  Moves all extremities well.  Speech is clear no cranial nerve deficits.  No midline cervical tenderness.  Denies neck pain.  Moves extremities well without any evidence of pain   ED Results / Procedures / Treatments   Labs (all labs ordered are listed, but only abnormal results are displayed) Labs Reviewed  CBC - Abnormal; Notable for the following components:      Result Value   Platelets 435 (*)    All other components within normal limits  BASIC METABOLIC PANEL - Abnormal; Notable for the following components:  Creatinine, Ser 2.17 (*)    GFR, Estimated 34 (*)    All other components within normal limits  TROPONIN I (HIGH SENSITIVITY) - Abnormal; Notable for the following components:   Troponin I (High Sensitivity) 132 (*)    All other components within normal limits  TROPONIN I (HIGH SENSITIVITY)     EKG  ED interpreted by me at 1110 heart rate 75 QRS 95 QTc 440 Normal sinus rhythm, probable left ventricular hypertrophy with repolarization abnormality.  Of note the patient denies have any acute chest pain.  All morphology of his EKG today compares to previous has significant change including new ST segment abnormality including suspect repolarization but possible ST elevation in anterior cardial leads.   RADIOLOGY   CT head interpreted by me as negative for acute gross intracranial hemorrhage   CT Head Wo Contrast  Result Date: 10/16/2022 CLINICAL DATA:  Head trauma, intracranial venous injury suspected EXAM: CT HEAD WITHOUT  CONTRAST TECHNIQUE: Contiguous axial images were obtained from the base of the skull through the vertex without intravenous contrast. RADIATION DOSE REDUCTION: This exam was performed according to the departmental dose-optimization program which includes automated exposure control, adjustment of the mA and/or kV according to patient size and/or use of iterative reconstruction technique. COMPARISON:  CT head 10/12/2022 FINDINGS: Brain: No evidence of acute infarction, hemorrhage, hydrocephalus, extra-axial collection or mass lesion/mass effect. Unchanged size and shape of the ventricular system. Chronic infarct in the left basal ganglia. Moderate overall chronic microvascular ischemic change. Vascular: No hyperdense vessel or unexpected calcification. Skull: Normal. Negative for fracture or focal lesion. Sinuses/Orbits: No middle ear or mastoid effusion. Paranasal sinuses are clear. Orbits are unremarkable. Other: None. IMPRESSION: No acute intracranial abnormality. Electronically Signed   By: Lorenza Cambridge M.D.   On: 10/16/2022 12:21      PROCEDURES:  Critical Care performed: No  Procedures   MEDICATIONS ORDERED IN ED: Medications  aspirin chewable tablet 324 mg (has no administration in time range)  sodium chloride 0.9 % bolus 1,000 mL (0 mLs Intravenous Stopped 10/16/22 1229)     IMPRESSION / MDM / ASSESSMENT AND PLAN / ED COURSE  I reviewed the triage vital signs and the nursing notes.                              Differential diagnosis includes, but is not limited to, dehydration, orthostatic hypotension, patient not adhering to the recommended medication dose changes, possible ethanol use as someone called the emergency department and notified us that he does use alcohol fairly heavily, but the patient tells me that he does not drink regularly, malnutrition, cardiac ischemia, arrhythmia dysrhythmia anemia metabolic abnormality etc.  Differential diagnosis remains broad  Workup CBC is  normal metabolic panel shows elevated creatinine with AKI.  Troponin elevated at 132 but no associated chest pain.  Clinical Course as of 10/16/22 1313  Fri Oct 16, 2022  1136 Noted patient has ST segment abnormality noted in V1 and V2 that appears to be somewhat new but I suspect there is underlying element of LVH.  He denies any active chest pain he has had no associated chest pain shortness of breath nausea or other obvious cardiac symptoms.  At this time I will add on a troponin but his clinical history of lightheadedness hypotension recent blood pressure medication changes etc. does not seem to be in keeping with obvious acute ACS or STEMI at this time.  Await further  testing [MQ]  1308 Given elevated troponin and initial ECG, consulted with Dr. Juliann Pares. In my opinion, suspect some dynamic changes or possible ischemia due to demand or hypotension that is now resolved. Patient remains pain free, no chest pain or pulmonary symptoms. Dr. Juliann Pares advises most likely LVH and repold but no stemi.  [MQ]    Clinical Course User Index [MQ] Sharyn Creamer, MD     Patient's presentation is most consistent with acute complicated illness / injury requiring diagnostic workup.   The patient is on the cardiac monitor to evaluate for evidence of arrhythmia and/or significant heart rate changes.   Clinical Course as of 10/16/22 1312  Fri Oct 16, 2022  1136 Noted patient has ST segment abnormality noted in V1 and V2 that appears to be somewhat new but I suspect there is underlying element of LVH.  He denies any active chest pain he has had no associated chest pain shortness of breath nausea or other obvious cardiac symptoms.  At this time I will add on a troponin but his clinical history of lightheadedness hypotension recent blood pressure medication changes etc. does not seem to be in keeping with obvious acute ACS or STEMI at this time.  Await further testing [MQ]  1308 Given elevated troponin and initial  ECG, consulted with Dr. Juliann Pares. In my opinion, suspect some dynamic changes or possible ischemia due to demand or hypotension that is now resolved. Patient remains pain free, no chest pain or pulmonary symptoms. Dr. Juliann Pares advises most likely LVH and repold but no stemi.  [MQ]    Clinical Course User Index [MQ] Sharyn Creamer, MD   ----------------------------------------- 1:13 PM on 10/16/2022 ----------------------------------------- Blood pressure is improved.  Patient resting comfortably asymptomatic.  Consulted with our hospitalist, patient accepted to the service of Dr. Huel Cote  FINAL CLINICAL IMPRESSION(S) / ED DIAGNOSES   Final diagnoses:  AKI (acute kidney injury) (HCC)  Hypotension, unspecified hypotension type  Elevated troponin  EKG abnormalities  Syncope and collapse     Rx / DC Orders   ED Discharge Orders     None        Note:  This document was prepared using Dragon voice recognition software and may include unintentional dictation errors.   Sharyn Creamer, MD 10/16/22 1332

## 2022-10-16 NOTE — Assessment & Plan Note (Signed)
Patient states he has not had any alcohol in 6 weeks but endorses previous heavy alcohol use.  Per EDP however, family member called in stating they were worried that he was drinking again.  - Check ethanol level - CIWA monitoring without Ativan - Multivitamin, thiamine folic acid daily

## 2022-10-16 NOTE — ED Triage Notes (Signed)
Patient states he was recently discharged from [facility] and was told to have home health follow up; patient cancelled home health when they called to schedule appointment. Today patient was dizzy and fell backwards, denies hitting head, reporting pain to buttocks.

## 2022-10-16 NOTE — ED Notes (Signed)
Meal tray delivered at this time. 

## 2022-10-16 NOTE — Assessment & Plan Note (Addendum)
-   Continue outpatient follow-up with neurology - Continue home medications

## 2022-10-16 NOTE — ED Notes (Signed)
Repeat EKG obtained per verbal order by Dr. Fanny Bien

## 2022-10-16 NOTE — Assessment & Plan Note (Signed)
Likely in the setting of poor p.o. intake, poor water intake and continued lisinopril use.  However, given history of neurogenic bladder, will check a renal ultrasound to rule out hydronephrosis.  - Hold home nephrotoxic agents - IV fluids - Renal ultrasound - Strict in and out - BMP in the a.m.

## 2022-10-16 NOTE — Assessment & Plan Note (Addendum)
Patient is presenting with pre-syncope after standing up consistent with repeat or static hypotension.  Recent admission for the same.  Likely multifactorial in the setting of continued lisinopril use without monitoring of blood pressure at home as instructed, poor p.o. intake, and dehydration.  No bleeding seen on CT head.  No new head trauma on examination.  - Hold home lisinopril - Continue IV fluid resuscitation - Recheck orthostatic vital signs in the morning

## 2022-10-16 NOTE — Assessment & Plan Note (Signed)
-   Continue home Synthroid °

## 2022-10-16 NOTE — Assessment & Plan Note (Signed)
-   Continue home aspirin, Plavix and statin

## 2022-10-16 NOTE — H&P (Signed)
History and Physical    Patient: Cody Burns ZOX:[id number] DOB: 1965-08-20 DOA: 10/16/2022 DOS: the patient was seen and examined on 10/16/2022 [facility]: [facility]  Patient coming from: Home  Chief Complaint:  Chief Complaint  Patient presents with   Hypotension   HPI: Cody Burns is a 57 y.o. male with medical history significant of relapsing remitting multiple sclerosis, CVA, hypertension, hypothyroidism, seizure disorder, previous syncopal episodes who presents to the ED due to a syncopal episode.  [name] states that since arriving home from his recent admission, he has had a poor appetite and is generally not consistent with his water intake.  Due to this, he has not had much p.o. intake.  He denies any food insecurity though.  He denies any alcohol use in the last 6 weeks, stating he lost his taste for it.  Today, he felt hungry and went to stand up to get food and became very dizzy and fell down to the ground.  He denies any loss of consciousness or hitting his head.  He states he laid there for a bit and then was able to get up.  At the same time, his home health nurse came to the house and checked his blood pressure and found it to be extremely low.  Due to this, EMS was called.  He denies any chest pain, shortness of breath, palpitations or lower extremity edema.  He states that since arriving in the ED and receiving IV fluids, he already feels better.  He has continued to take 1.5 tablets of lisinopril daily and has not checked his blood pressure at home.  Per chart review, Patient was recently admitted on 5/20 to 5/22 for syncopal episode secondary to orthostatic hypotension.  On discharge, lisinopril was restarted and patient was recommended to check blood pressure at home and to hold it if low.  Gabapentin and cyclobenzaprine were also discontinued on discharge.  Per EDP, patient's family member called and voiced concern regarding patient's alcohol use at home,  stating that he drinks more than reported amounts.  ED course: On arrival to the ED, patient was hypotensive at 90/74 with heart rate of 72.  He was saturating at 100% on room air.  He was afebrile 98.2.  Initial workup notable for platelets of 435, potassium 3.5, creatinine 2.17 and GFR of 34.  Initial troponin elevated at 139. CT head was obtained that demonstrates no acute intracranial abnormalities.  Patient started on IV fluids and [facility] contacted for admission.  Review of Systems: As mentioned in the history of present illness. All other systems reviewed and are negative.  Past Medical History:  Diagnosis Date   Blackout 2018   several episodes with unknown etiology. cause of his ankle break. awaiting neuro workup   CVA (cerebral vascular accident) (HCC)    Hypertension    Hypothyroidism    Multiple sclerosis (HCC) 1990   Myocardial infarct (HCC)    Optic neuritis    x 3. takes steriods and it resolves.   Seizures (HCC)    as a child. diagnosed with MS and once treated, seizures resolved.   Sleep apnea    Past Surgical History:  Procedure Laterality Date   CORONARY ARTERY BYPASS GRAFT  2015   CORONARY STENT INTERVENTION N/A 06/07/2017   Procedure: CORONARY STENT INTERVENTION;  Surgeon: Alwyn Pea, MD;  Location: [facility] INVASIVE CV LAB;  Service: Cardiovascular;  Laterality: N/A;   JOINT REPLACEMENT Left 2007   left hip replacement  LEFT HEART CATH AND CORS/GRAFTS ANGIOGRAPHY N/A 06/07/2017   Procedure: LEFT HEART CATH AND CORS/GRAFTS ANGIOGRAPHY;  Surgeon: Lamar Blinks, MD;  Location: [facility] INVASIVE CV LAB;  Service: Cardiovascular;  Laterality: N/A;   ORIF ANKLE FRACTURE Right 09/22/2016   Procedure: OPEN REDUCTION INTERNAL FIXATION (ORIF) ANKLE FRACTURE;  Surgeon: Christena Flake, MD;  Location: [facility] ORS;  Service: Orthopedics;  Laterality: Right;   quadruple cardiac bypass  2015   Social History:  reports that he has never smoked. He has quit using smokeless tobacco.   His smokeless tobacco use included chew. He reports that he does not currently use alcohol. He reports that he does not use drugs.  Allergies  Allergen Reactions   Hydrochlorothiazide W-Triamterene Other (See Comments) and Palpitations    High blood pressure   Metoprolol Other (See Comments) and Palpitations    Exacerbates MS, High blood pressure   Penicillins Other (See Comments)    Sores in mouth  Has patient had a PCN reaction causing immediate rash, facial/tongue/throat swelling, SOB or lightheadedness with hypotension: No  Has patient had a PCN reaction causing severe rash involving mucus membranes or skin necrosis: No  Has patient had a PCN reaction that required hospitalization: No  Has patient had a PCN reaction occurring within the last 10 years: No  If all of the above answers are "NO", then may proceed with Cephalosporin use.  MOUTH SORES   Aripiprazole Other (See Comments)    WORSENED SLEEP WALKING    Family History  Problem Relation Age of Onset   Cancer Mother    Other Father    CAD Brother     Prior to Admission medications   Medication Sig Start Date End Date Taking? Authorizing Provider  aspirin EC 81 MG tablet Take 81 mg by mouth daily.    [provider]  atorvastatin (LIPITOR) 80 MG tablet Take 80 mg by mouth at bedtime.    [provider]  buPROPion (WELLBUTRIN XL) 300 MG 24 hr tablet Take 300 mg by mouth daily.    [provider]  clopidogrel (PLAVIX) 75 MG tablet Take 75 mg by mouth daily.    [provider]  docusate sodium (COLACE) 50 MG capsule Take 50 mg by mouth 2 (two) times daily.    [provider]  escitalopram (LEXAPRO) 20 MG tablet Take 20 mg by mouth daily.    [provider]  folic acid (FOLVITE) 400 MCG tablet Take 400 mcg by mouth daily.    [provider]  lamoTRIgine (LAMICTAL) 25 MG tablet Take 25 mg by mouth daily.    [provider]  levothyroxine (SYNTHROID,  LEVOTHROID) 25 MCG tablet Take 25 mcg by mouth daily before breakfast.     [provider]  lisinopril (ZESTRIL) 20 MG tablet Take 1 tablet (20 mg total) by mouth daily. Skip the dose if systolic BP less than 150 mmHg 10/14/22   Gillis Santa, MD  montelukast (SINGULAIR) 10 MG tablet Take 10 mg by mouth daily.     [provider]  niacin (NIASPAN) 500 MG CR tablet Take 500 mg by mouth at bedtime. 11/24/16 12/28/17  [provider]  OXcarbazepine (TRILEPTAL) 150 MG tablet Take 1 tablet by mouth 2 (two) times daily. 02/23/20   [provider]  QUEtiapine (SEROQUEL) 100 MG tablet Take 100 mg by mouth at bedtime.    [provider]  tiZANidine (ZANAFLEX) 4 MG tablet Take 4 mg by mouth every 8 (eight) hours  as needed for muscle spasms. 02/25/21   [provider]  vitamin B-12 (CYANOCOBALAMIN) 1000 MCG tablet Take 1,000 mcg by mouth daily.  07/09/09   [provider]    Physical Exam: Vitals:   10/16/22 1155 10/16/22 1200 10/16/22 1215 10/16/22 1300  BP: 102/62 90/74 110/62 114/74  Pulse:  71 75   Resp:  20 18 19   Temp:      TempSrc:      SpO2:  100% 100%   Weight:      Height:       Physical Exam Vitals and nursing note reviewed.  Constitutional:      General: He is not in acute distress.    Appearance: He is normal weight.  HENT:     Head: Normocephalic.     Comments: Laceration above the right eyebrow that has been sutured and appears to be healing well.    Mouth/Throat:     Mouth: Mucous membranes are dry.     Pharynx: Oropharynx is clear.  Eyes:     Conjunctiva/sclera: Conjunctivae normal.     Pupils: Pupils are equal, round, and reactive to light.  Cardiovascular:     Rate and Rhythm: Normal rate and regular rhythm.     Heart sounds: No murmur heard. Pulmonary:     Effort: Pulmonary effort is normal. No respiratory distress.     Breath sounds: Normal breath sounds. No wheezing, rhonchi or rales.  Abdominal:     General:  Bowel sounds are normal. There is no distension.     Palpations: Abdomen is soft.     Tenderness: There is no abdominal tenderness.  Musculoskeletal:     Right lower leg: No edema.     Left lower leg: No edema.  Skin:    General: Skin is warm and dry.  Neurological:     Mental Status: He is alert and oriented to person, place, and time. Mental status is at baseline.  Psychiatric:        Mood and Affect: Mood normal.        Behavior: Behavior normal.    Data Reviewed: CBC with WBC of 6.7, hemoglobin 13.0, platelets of 435 BMP with sodium of 138, potassium 3.5, bicarb 24, glucose 79, BUN 17, creatinine 1.71, and GFR of 34 Troponin 132  Initial EKG personally reviewed.  Sinus rhythm with rate of 75.  ST elevations in leads V1 and V2 as well as V4 and V5 of approximately 1 mm, not much elevation in V3.  Repeat EKG reviewed at which time ST elevations had improved.  CT Head Wo Contrast  Result Date: 10/16/2022 CLINICAL DATA:  Head trauma, intracranial venous injury suspected EXAM: CT HEAD WITHOUT CONTRAST TECHNIQUE: Contiguous axial images were obtained from the base of the skull through the vertex without intravenous contrast. RADIATION DOSE REDUCTION: This exam was performed according to the departmental dose-optimization program which includes automated exposure control, adjustment of the mA and/or kV according to patient size and/or use of iterative reconstruction technique. COMPARISON:  CT head 10/12/2022 FINDINGS: Brain: No evidence of acute infarction, hemorrhage, hydrocephalus, extra-axial collection or mass lesion/mass effect. Unchanged size and shape of the ventricular system. Chronic infarct in the left basal ganglia. Moderate overall chronic microvascular ischemic change. Vascular: No hyperdense vessel or unexpected calcification. Skull: Normal. Negative for fracture or focal lesion. Sinuses/Orbits: No middle ear or mastoid effusion. Paranasal sinuses are clear. Orbits are  unremarkable. Other: None. IMPRESSION: No acute intracranial abnormality. Electronically Signed   By: Sandria Senter  Celine Mans M.D.   On: 10/16/2022 12:21    Results are pending, will review when available.  Assessment and Plan:  * Pre-syncope Patient is presenting with pre-syncope after standing up consistent with repeat or static hypotension.  Recent admission for the same.  Likely multifactorial in the setting of continued lisinopril use without monitoring of blood pressure at home as instructed, poor p.o. intake, and dehydration.  No bleeding seen on CT head.  No new head trauma on examination.  - Hold home lisinopril - Continue IV fluid resuscitation - Recheck orthostatic vital signs in the morning  Demand ischemia Troponin elevation likely secondary to hypotension leading to demand ischemia.  Some minimal ST elevations on EKG, however EDP discussed with STEMI team, who do not feel it is consistent with ACS.  Patient denies any chest pain previously or at this time.  - Telemetry monitoring - Repeat troponin pending - Stat EKG for any chest pain development - Hold off on heparin infusion unless chest pain develops or patient's troponin markedly increases  Acute kidney injury (HCC) Likely in the setting of poor p.o. intake, poor water intake and continued lisinopril use.  However, given history of neurogenic bladder, will check a renal ultrasound to rule out hydronephrosis.  - Hold home nephrotoxic agents - IV fluids - Renal ultrasound - Strict in and out - BMP in the a.m.  Hypertension - Hold home antihypertensives in the setting of hypotension  Alcohol dependence (HCC) Patient states he has not had any alcohol in 6 weeks but endorses previous heavy alcohol use.  Per EDP however, family member called in stating they were worried that he was drinking again.  - Check ethanol level - CIWA monitoring without Ativan - Multivitamin, thiamine folic acid daily  Coronary artery disease -  Continue home aspirin, Plavix and statin  Hypothyroidism - Continue home Synthroid  Multiple sclerosis, relapsing-remitting (HCC) - Continue outpatient follow-up with neurology - Continue home medications  Advance Care Planning:   Code Status: Full Code verified by patient  Consults: None  Family Communication: No family at bedside  Severity of Illness: The appropriate patient status for this patient is OBSERVATION. Observation status is judged to be reasonable and necessary in order to provide the required intensity of service to ensure the patient's safety. The patient's presenting symptoms, physical exam findings, and initial radiographic and laboratory data in the context of their medical condition is felt to place them at decreased risk for further clinical deterioration. Furthermore, it is anticipated that the patient will be medically stable for discharge from the hospital within 2 midnights of admission.   Author: Verdene Lennert, MD 10/16/2022 2:13 PM  For on call review [email or web address].

## 2022-10-17 ENCOUNTER — Observation Stay: Payer: No Typology Code available for payment source

## 2022-10-17 DIAGNOSIS — I2489 Other forms of acute ischemic heart disease: Secondary | ICD-10-CM | POA: Diagnosis not present

## 2022-10-17 DIAGNOSIS — R55 Syncope and collapse: Secondary | ICD-10-CM | POA: Diagnosis not present

## 2022-10-17 DIAGNOSIS — N179 Acute kidney failure, unspecified: Secondary | ICD-10-CM | POA: Diagnosis not present

## 2022-10-17 DIAGNOSIS — I10 Essential (primary) hypertension: Secondary | ICD-10-CM | POA: Diagnosis not present

## 2022-10-17 LAB — BASIC METABOLIC PANEL
Anion gap: 6 (ref 5–15)
BUN: 17 mg/dL (ref 6–20)
CO2: 25 mmol/L (ref 22–32)
Calcium: 9.2 mg/dL (ref 8.9–10.3)
Chloride: 107 mmol/L (ref 98–111)
Creatinine, Ser: 1.48 mg/dL — ABNORMAL HIGH (ref 0.61–1.24)
GFR, Estimated: 55 mL/min — ABNORMAL LOW (ref >60)
Glucose, Bld: 93 mg/dL (ref 70–99)
Potassium: 3.6 mmol/L (ref 3.5–5.1)
Sodium: 138 mmol/L (ref 135–145)

## 2022-10-17 LAB — CBC
HCT: 37.4 % — ABNORMAL LOW (ref 39.0–52.0)
Hemoglobin: 12.3 g/dL — ABNORMAL LOW (ref 13.0–17.0)
MCH: 30.7 pg (ref 26.0–34.0)
MCHC: 32.9 g/dL (ref 30.0–36.0)
MCV: 93.3 fL (ref 80.0–100.0)
Platelets: 371 10*3/uL (ref 150–400)
RBC: 4.01 MIL/uL — ABNORMAL LOW (ref 4.22–5.81)
RDW: 13.6 % (ref 11.5–15.5)
WBC: 5.9 10*3/uL (ref 4.0–10.5)
nRBC: 0 % (ref 0.0–0.2)

## 2022-10-17 NOTE — Progress Notes (Signed)
[facility]  PROGRESS NOTE  Cody Burns ZOX:[id number] DOB: [birth year] DOA: 10/16/2022 PCP: [facility]   Brief HPI:   57 year old male with medical history of relapsing/remitting multiple sclerosis, CVA, hypertension, hypothyroidism, seizure disorder, previous syncopal episodes presented to ED with syncopal episode. Patient was just discharged from hospital on 5/23, patient says that he had poor appetite and poor p.o. intake.  Denies alcohol use in the past 6 weeks.  Patient is that he felt hungry and as he stood up he felt dizzy and fell down.  As per patient he lost consciousness. Per chart review, Patient was recently admitted on 5/20 to 5/22 for syncopal episode secondary to orthostatic hypotension.  On discharge, lisinopril was restarted and patient was recommended to check blood pressure at home and to hold it if low.  Gabapentin and cyclobenzaprine were also discontinued on discharge.   Per EDP, patient's family member called and voiced concern regarding patient's alcohol use at home, stating that he drinks more than reported amounts.   Assessment/Plan:   Recurrent syncope -Secondary to orthostatic hypotension -CT head unremarkable -Lisinopril was held on admission -Echocardiogram from 5/21 showed EF of 50 to 55%, grade 1 diastolic dysfunction, no evidence of aortic valve stenosis -Check orthostatic vital signs every 4 hours x 3; continue monitoring on telemetry -Start TED hose bilaterally  Troponin elevation, mild -Troponin was elevated at 132, repeat troponin 107 -Likely demand ischemia from syncope/hypotension -Denies chest pain -Echocardiogram obtained on 5/21 was unremarkable  Acute kidney injury versus CKD stage III a -Creatinine was elevated 2.17, improved to 1.48 -Likely has underlying CKD -Will order renal ultrasound as he has history of neurogenic bladder  Hypertension -Blood pressure has been soft -Hold lisinopril  Alcohol  dependence -Family member called that they were worried that he was drinking alcohol again -Started on CIWA monitoring without Ativan -Continue thiamine, folic acid daily  Coronary artery disease -Continue aspirin, Plavix, statin  Hypothyroidism -Continue Synthroid  Multiple sclerosis, remitting/relapsing -Follow-up neurology as outpatient  Medications     aspirin EC  81 mg Oral Daily   atorvastatin  80 mg Oral QHS   buPROPion  300 mg Oral Daily   clopidogrel  75 mg Oral Daily   cyanocobalamin  1,000 mcg Oral Daily   enoxaparin (LOVENOX) injection  0.5 mg/kg Subcutaneous Q24H   escitalopram  20 mg Oral Daily   folic acid  1 mg Oral Daily   lamoTRIgine  25 mg Oral Daily   levothyroxine  25 mcg Oral Q0600   montelukast  10 mg Oral QHS   multivitamin with minerals  1 tablet Oral Daily   OXcarbazepine  150 mg Oral BID   QUEtiapine  100 mg Oral QHS   sodium chloride flush  3 mL Intravenous Q12H   thiamine  100 mg Oral Daily     Data Reviewed:   CBG:  No results for input(s): "GLUCAP" in the last 168 hours.  SpO2: 100 %    Vitals:   10/17/22 0052 10/17/22 0454 10/17/22 0456 10/17/22 0734  BP:  96/80 94/72 113/69  Pulse:  71 73 77  Resp: 17 16 14 16   Temp:  98 F (36.7 C)  98.1 F (36.7 C)  TempSrc:    Oral  SpO2:  98%  100%  Weight:      Height:          Data Reviewed:  Basic Metabolic Panel: Recent Labs  Lab 10/12/22 1727 10/13/22 0542 10/14/22 0350 10/16/22 1115 10/17/22 0439  NA 135 138 136 138 138  K 3.0* 3.3* 3.7 3.5 3.6  CL 100 106 105 104 107  CO2 25 24 26 24 25   GLUCOSE 127* 99 102* 79 93  BUN 29* 25* 17 17 17   CREATININE 2.46* 1.72* 1.30* 2.17* 1.48*  CALCIUM 9.4 9.2 8.9 9.5 9.2  MG  --   --  1.9  --   --   PHOS  --   --  3.2  --   --     CBC: Recent Labs  Lab 10/12/22 1726 10/12/22 1727 10/13/22 0542 10/14/22 0350 10/16/22 1115 10/17/22 0439  WBC 10.1 10.1 6.7 6.1 6.7 5.9  NEUTROABS 6.7 THIS TEST WAS ORDERED IN ERROR AND  HAS BEEN CREDITED.  --   --   --   --   HGB 14.1 14.1 12.6* 11.8* 13.0 12.3*  HCT 42.2 41.9 37.8* 35.2* 40.0 37.4*  MCV 92.5 92.3 92.6 92.6 94.1 93.3  PLT 506* 477* 374 363 435* 371    LFT Recent Labs  Lab 10/16/22 1319  AST 37  ALT 36  ALKPHOS 72  BILITOT 1.0  PROT 6.2*  ALBUMIN 3.6     Antibiotics: Anti-infectives (From admission, onward)    None        DVT prophylaxis: Lovenox  Code Status: Full code  Family Communication: No family at bedside   CONSULTS    Subjective   Feels better this morning, eating breakfast.  Denies nausea or vomiting.   Objective    Physical Examination:   General-appears in no acute distress Heart-S1-S2, regular, no murmur auscultated Lungs-clear to auscultation bilaterally, no wheezing or crackles auscultated Abdomen-soft, nontender, no organomegaly Extremities-no edema in the lower extremities Neuro-alert, oriented x3, no focal deficit noted  Status is: Inpatient:             Meredeth Ide   [facility] If 7PM-7AM, please contact night-coverage at [email or web address], Office  [phone]   10/17/2022, 1:01 PM  LOS: 0 days

## 2022-10-18 DIAGNOSIS — N179 Acute kidney failure, unspecified: Secondary | ICD-10-CM | POA: Diagnosis not present

## 2022-10-18 DIAGNOSIS — I10 Essential (primary) hypertension: Secondary | ICD-10-CM | POA: Diagnosis not present

## 2022-10-18 DIAGNOSIS — R55 Syncope and collapse: Secondary | ICD-10-CM | POA: Diagnosis not present

## 2022-10-18 DIAGNOSIS — I2489 Other forms of acute ischemic heart disease: Secondary | ICD-10-CM | POA: Diagnosis not present

## 2022-10-18 LAB — BASIC METABOLIC PANEL
Anion gap: 6 (ref 5–15)
BUN: 21 mg/dL — ABNORMAL HIGH (ref 6–20)
CO2: 25 mmol/L (ref 22–32)
Calcium: 9 mg/dL (ref 8.9–10.3)
Chloride: 106 mmol/L (ref 98–111)
Creatinine, Ser: 1.24 mg/dL (ref 0.61–1.24)
GFR, Estimated: >60 mL/min (ref >60)
Glucose, Bld: 95 mg/dL (ref 70–99)
Potassium: 3.8 mmol/L (ref 3.5–5.1)
Sodium: 137 mmol/L (ref 135–145)

## 2022-10-18 NOTE — TOC Transition Note (Addendum)
Transition of Care [facility]) - CM/SW Discharge Note   Patient Details  Name: Cody Burns MRN: [id number] Date of Birth: 1966-05-18  [facility] CM/SW Contact:  Liliana Cline, LCSW Phone Number: 10/18/2022, 1:59 PM   Clinical Narrative:    Patient to DC home today. Notified Alyssa with [facility].  [facility] waiver is on file. Taxi voucher provided to home address.    Final next level of care: Home w Home Health Services Barriers to Discharge: Barriers Resolved   Patient Goals and CMS Choice      Discharge Placement                  Patient to be transferred to facility by: taxi      Discharge Plan and Services Additional resources added to the After Visit Summary for                            [facility] Arranged: PT, OT [facility] Agency: [facility] Date [facility] Agency Contacted: 10/18/22   Representative spoke with at [facility] Agency: Alyssa  Social Determinants of Health (SDOH) Interventions SDOH Screenings   Food Insecurity: No Food Insecurity (10/16/2022)  Housing: Low Risk  (10/16/2022)  Transportation Needs: No Transportation Needs (10/16/2022)  Utilities: Not At Risk (10/16/2022)  Tobacco Use: Medium Risk (10/16/2022)     Readmission Risk Interventions     No data to display

## 2022-10-18 NOTE — Discharge Summary (Addendum)
Physician Discharge Summary   Patient: Cody Burns MRN: [id number] DOB: 1966/03/15  Admit date:     10/16/2022  Discharge date: 10/18/22  Discharge Physician: Meredeth Ide   PCP: [facility]   Recommendations at discharge:   Follow-up PCP in 1 week  Discharge Diagnoses: Principal Problem:   Pre-syncope Active Problems:   Demand ischemia   Acute kidney injury (HCC)   Hypertension   Alcohol dependence (HCC)   Multiple sclerosis, relapsing-remitting (HCC)   Hypothyroidism   Coronary artery disease  Resolved Problems:   * No resolved hospital problems. *  57 year old male with medical history of relapsing/remitting multiple sclerosis, CVA, hypertension, hypothyroidism, seizure disorder, previous syncopal episodes presented to ED with syncopal episode. Patient was just discharged from hospital on 5/23, patient says that he had poor appetite and poor p.o. intake.  Denies alcohol use in the past 6 weeks.  Patient is that he felt hungry and as he stood up he felt dizzy and fell down.  As per patient he lost consciousness. Per chart review, Patient was recently admitted on 5/20 to 5/22 for syncopal episode secondary to orthostatic hypotension.  On discharge, lisinopril was restarted and patient was recommended to check blood pressure at home and to hold it if low.  Gabapentin and cyclobenzaprine were also discontinued on discharge.   Per EDP, patient's family member called and voiced concern regarding patient's alcohol use at home, stating that he drinks more than reported amounts.   Hospital Course:    Recurrent syncope -Secondary to orthostatic hypotension -CT head unremarkable -Lisinopril was held on admission -Echocardiogram from 5/21 showed EF of 50 to 55%, grade 1 diastolic dysfunction, no evidence of aortic valve stenosis -Orthostatics checked this morning are positive, however patient was asymptomatic -Will place TED hose bilaterally -Will discharge home  with home health PT -Will discontinue lisinopril   Troponin elevation, mild -Troponin was elevated at 132, repeat troponin 107 -Likely demand ischemia from syncope/hypotension -Denies chest pain -Echocardiogram obtained on 5/21 was unremarkable   Acute kidney injury versus CKD stage III a -Creatinine was elevated 2.17, improved to 1.24 -Likely has underlying CKD -Renal ultrasound obtained showed no acute findings, no hydronephrosis.  Small echogenic foci in the midpole of left kidney, stable since prior study 6 years ago, fever small angiolipomas.   Hypertension -Blood pressure has been soft -Lisinopril has been discontinued   Alcohol dependence -Family member called that they were worried that he was drinking alcohol again -Started on CIWA monitoring without Ativan    Coronary artery disease -Continue aspirin, Plavix, statin   Hypothyroidism -Continue Synthroid   Multiple sclerosis, remitting/relapsing -Follow-up neurology as outpatient        Consultants: Procedures performed:  Disposition: Home Diet recommendation:  Discharge Diet Orders (From admission, onward)     Start     Ordered   10/18/22 0000  Diet - low sodium heart healthy        10/18/22 1110           Regular diet DISCHARGE MEDICATION: Allergies as of 10/18/2022       Reactions   Hydrochlorothiazide W-triamterene Other (See Comments), Palpitations   High blood pressure   Metoprolol Other (See Comments), Palpitations   Exacerbates MS, High blood pressure   Penicillins Other (See Comments)   Sores in mouth Has patient had a PCN reaction causing immediate rash, facial/tongue/throat swelling, SOB or lightheadedness with hypotension: No Has patient had a PCN reaction causing severe rash involving mucus membranes or  skin necrosis: No Has patient had a PCN reaction that required hospitalization: No Has patient had a PCN reaction occurring within the last 10 years: No If all of the above answers  are "NO", then may proceed with Cephalosporin use. MOUTH SORES   Aripiprazole Other (See Comments)   WORSENED SLEEP WALKING        Medication List     STOP taking these medications    lisinopril 20 MG tablet Commonly known as: ZESTRIL   niacin 500 MG ER tablet Commonly known as: VITAMIN B3       TAKE these medications    aspirin EC 81 MG tablet Take 81 mg by mouth daily.   atorvastatin 80 MG tablet Commonly known as: LIPITOR Take 80 mg by mouth at bedtime.   buPROPion 300 MG 24 hr tablet Commonly known as: WELLBUTRIN XL Take 300 mg by mouth daily.   clopidogrel 75 MG tablet Commonly known as: PLAVIX Take 75 mg by mouth daily.   cyanocobalamin 1000 MCG tablet Commonly known as: VITAMIN B12 Take 1,000 mcg by mouth daily.   docusate sodium 50 MG capsule Commonly known as: COLACE Take 50 mg by mouth 2 (two) times daily.   escitalopram 20 MG tablet Commonly known as: LEXAPRO Take 20 mg by mouth daily.   folic acid 400 MCG tablet Commonly known as: FOLVITE Take 400 mcg by mouth daily.   lamoTRIgine 25 MG tablet Commonly known as: LAMICTAL Take 25 mg by mouth daily.   levothyroxine 25 MCG tablet Commonly known as: SYNTHROID Take 25 mcg by mouth daily before breakfast.   montelukast 10 MG tablet Commonly known as: SINGULAIR Take 10 mg by mouth daily.   OXcarbazepine 150 MG tablet Commonly known as: TRILEPTAL Take 1 tablet by mouth 2 (two) times daily.   QUEtiapine 100 MG tablet Commonly known as: SEROQUEL Take 100 mg by mouth at bedtime.   tiZANidine 4 MG tablet Commonly known as: ZANAFLEX Take 4 mg by mouth every 8 (eight) hours as needed for muscle spasms.        Follow-up Information     [facility] Follow up in 1 week(s).   Specialty: General Practice Contact information: [address] [place] [address] [phone]                Discharge Exam: Ceasar Mons Weights   10/16/22 1112  Weight: 100.4 kg    General-appears in no acute distress Heart-S1-S2, regular, no murmur auscultated Lungs-clear to auscultation bilaterally, no wheezing or crackles auscultated Abdomen-soft, nontender, no organomegaly Extremities-no edema in the lower extremities Neuro-alert, oriented x3, no focal deficit noted  Condition at discharge: good  The results of significant diagnostics from this hospitalization (including imaging, microbiology, ancillary and laboratory) are listed below for reference.   Imaging Studies: US RENAL  Result Date: 10/17/2022 CLINICAL DATA:  Acute kidney injury EXAM: RENAL / URINARY TRACT ULTRASOUND COMPLETE COMPARISON:  02/24/2017 FINDINGS: Right Kidney: Renal measurements: 9.9 x 4.6 x 4.0 cm = volume: 96 mL. Echogenicity within normal limits. No mass or hydronephrosis visualized. Left Kidney: Renal measurements: 10.4 x 5.6 x 4.8 cm = volume: 144 mL. Small echogenic foci in the midpole of the left kidney. These measure up to 6 mm. These are similar to prior study. 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 No hydronephrosis. Normal echotexture. Bladder: Layering debris.  Bladder wall unremarkable. Other: None. IMPRESSION: Small echogenic foci within the midpole region of the left kidney, stable since prior study, favor small angiomyolipomas. No  acute findings.  No hydronephrosis. Electronically Signed   By: Charlett Nose M.D.   On: 10/17/2022 21:15   CT Head Wo Contrast  Result Date: 10/16/2022 CLINICAL DATA:  Head trauma, intracranial venous injury suspected EXAM: CT HEAD WITHOUT CONTRAST TECHNIQUE: Contiguous axial images were obtained from the base of the skull through the vertex without intravenous contrast. RADIATION DOSE REDUCTION: This exam was performed according to the departmental dose-optimization program which includes automated exposure control, adjustment of the mA and/or kV according to patient size and/or use of iterative reconstruction technique. COMPARISON:  CT head  10/12/2022 FINDINGS: Brain: No evidence of acute infarction, hemorrhage, hydrocephalus, extra-axial collection or mass lesion/mass effect. Unchanged size and shape of the ventricular system. Chronic infarct in the left basal ganglia. Moderate overall chronic microvascular ischemic change. Vascular: No hyperdense vessel or unexpected calcification. Skull: Normal. Negative for fracture or focal lesion. Sinuses/Orbits: No middle ear or mastoid effusion. Paranasal sinuses are clear. Orbits are unremarkable. Other: None. IMPRESSION: No acute intracranial abnormality. Electronically Signed   By: Lorenza Cambridge M.D.   On: 10/16/2022 12:21   ECHOCARDIOGRAM COMPLETE  Result Date: 10/14/2022    ECHOCARDIOGRAM REPORT   Patient Name:   RAYVEN UGOLINI Date of Exam: 10/13/2022 Medical Rec #:  [id number]     Height:       72.0 in Accession #:    [id number]    Weight:       230.2 lb Date of Birth:  05-13-1966     BSA:          2.262 m Patient Age:    56 years      BP:           134/81 mmHg Patient Gender: M             HR:           86 bpm. Exam Location:  [facility] Procedure: 2D Echo, Cardiac Doppler and Color Doppler Indications:     Syncope  History:         Patient has no prior history of Echocardiogram examinations.                  Previous Myocardial Infarction, Prior CABG, Stroke,                  Signs/Symptoms:Syncope; Risk Factors:Hypertension and                  Dyslipidemia.  Sonographer:     Mikki Harbor Referring Phys:  [id number] Inez Catalina Diagnosing Phys: Alwyn Pea MD IMPRESSIONS  1. Left ventricular ejection fraction, by estimation, is 50 to 55%. The left ventricle has low normal function. The left ventricle has no regional wall motion abnormalities. The left ventricular internal cavity size was moderately dilated. There is moderate concentric left ventricular hypertrophy. Left ventricular diastolic parameters are consistent with Grade I diastolic dysfunction (impaired relaxation).  2. Right ventricular  systolic function is normal. The right ventricular size is normal.  3. The mitral valve is grossly normal. Mild to moderate mitral valve regurgitation.  4. The aortic valve is calcified. Aortic valve regurgitation is not visualized. Aortic valve sclerosis is present, with no evidence of aortic valve stenosis. FINDINGS  Left Ventricle: Left ventricular ejection fraction, by estimation, is 50 to 55%. The left ventricle has low normal function. The left ventricle has no regional wall motion abnormalities. The left ventricular internal cavity size was moderately dilated. There is moderate concentric left ventricular hypertrophy. Left  ventricular diastolic parameters are consistent with Grade I diastolic dysfunction (impaired relaxation). Right Ventricle: The right ventricular size is normal. No increase in right ventricular wall thickness. Right ventricular systolic function is normal. Left Atrium: Left atrial size was normal in size. Right Atrium: Right atrial size was normal in size. Pericardium: There is no evidence of pericardial effusion. Mitral Valve: The mitral valve is grossly normal. There is mild thickening of the mitral valve leaflet(s). There is mild calcification of the mitral valve leaflet(s). Normal mobility of the mitral valve leaflets. Mild to moderate mitral valve regurgitation. MV peak gradient, 2.4 mmHg. The mean mitral valve gradient is 1.0 mmHg. Tricuspid Valve: The tricuspid valve is normal in structure. Tricuspid valve regurgitation is trivial. Aortic Valve: The aortic valve is calcified. Aortic valve regurgitation is not visualized. Aortic valve sclerosis is present, with no evidence of aortic valve stenosis. Aortic valve mean gradient measures 2.0 mmHg. Aortic valve peak gradient measures 4.3  mmHg. Aortic valve area, by VTI measures 2.91 cm. Pulmonic Valve: The pulmonic valve was normal in structure. Pulmonic valve regurgitation is not visualized. Aorta: The ascending aorta was not well  visualized. IAS/Shunts: No atrial level shunt detected by color flow Doppler.  LEFT VENTRICLE PLAX 2D LVIDd:         5.40 cm   Diastology LVIDs:         4.50 cm   LV e' medial:    5.66 cm/s LV PW:         1.30 cm   LV E/e' medial:  10.1 LV IVS:        1.40 cm   LV e' lateral:   7.62 cm/s LVOT diam:     2.10 cm   LV E/e' lateral: 7.5 LV SV:         51 LV SV Index:   22 LVOT Area:     3.46 cm  RIGHT VENTRICLE RV Basal diam:  3.00 cm RV Mid diam:    2.30 cm RV S prime:     8.49 cm/s LEFT ATRIUM           Index        RIGHT ATRIUM           Index LA diam:      3.80 cm 1.68 cm/m   RA Area:     14.90 cm LA Vol (A2C): 61.4 ml 27.15 ml/m  RA Volume:   37.60 ml  16.63 ml/m LA Vol (A4C): 50.6 ml 22.37 ml/m  AORTIC VALVE                    PULMONIC VALVE AV Area (Vmax):    3.00 cm     PV Vmax:       0.90 m/s AV Area (Vmean):   3.32 cm     PV Peak grad:  3.3 mmHg AV Area (VTI):     2.91 cm AV Vmax:           104.00 cm/s AV Vmean:          58.800 cm/s AV VTI:            0.174 m AV Peak Grad:      4.3 mmHg AV Mean Grad:      2.0 mmHg LVOT Vmax:         90.20 cm/s LVOT Vmean:        56.300 cm/s LVOT VTI:          0.146 m LVOT/AV  VTI ratio: 0.84  AORTA Ao Root diam: 3.70 cm MITRAL VALVE MV Area (PHT): 4.26 cm    SHUNTS MV Area VTI:   2.60 cm    Systemic VTI:  0.15 m MV Peak grad:  2.4 mmHg    Systemic Diam: 2.10 cm MV Mean grad:  1.0 mmHg MV Vmax:       0.77 m/s MV Vmean:      45.3 cm/s MV Decel Time: 178 msec MV E velocity: 57.10 cm/s MV A velocity: 70.70 cm/s MV E/A ratio:  0.81 Dwayne D Callwood MD Electronically signed by Alwyn Pea MD Signature Date/Time: 10/14/2022/2:27:54 PM    Final    CT Head Wo Contrast  Result Date: 10/12/2022 CLINICAL DATA:  Status post fall. EXAM: CT HEAD WITHOUT CONTRAST TECHNIQUE: Contiguous axial images were obtained from the base of the skull through the vertex without intravenous contrast. RADIATION DOSE REDUCTION: This exam was performed according to the departmental  dose-optimization program which includes automated exposure control, adjustment of the mA and/or kV according to patient size and/or use of iterative reconstruction technique. COMPARISON:  06/05/2017 FINDINGS: Brain: No evidence of acute infarction, hemorrhage, hydrocephalus, extra-axial collection or mass lesion/mass effect. Chronic left basal ganglia lacunar infarcts are identified with ex vacuo dilatation of the anterior horn of the left lateral ventricle. There is low-attenuation within the subcortical and periventricular white matter compatible with chronic microvascular disease. Vascular: No hyperdense vessel or unexpected calcification. Skull: Previous left parietal craniotomy defect. Negative for acute fracture or focal lesion. Sinuses/Orbits: No acute abnormality. Other: None. IMPRESSION: 1. No acute intracranial abnormalities. 2. Chronic left basal ganglia lacunar infarcts with ex vacuo dilatation of the anterior horn of the left lateral ventricle. 3. Chronic microvascular disease. Electronically Signed   By: Signa Kell M.D.   On: 10/12/2022 11:13   DG Chest 2 View  Result Date: 10/12/2022 CLINICAL DATA:  57 year old male status post fall last night. On blood thinners. Right side pain. EXAM: CHEST - 2 VIEW COMPARISON:  Chest radiographs 07/26/2017. FINDINGS: AP seated and lateral views of the chest at 0956 hours. Chronic CABG. Normal cardiac size and mediastinal contours. Lung volumes remain normal. Both lungs appear clear. No pneumothorax or pleural effusion. Chronic sternotomy. Subtle fractures of the right anterior 5th and 7th ribs, although probably chronic and 7th rib deformity identified last time. No acute osseous abnormality identified. Paucity of bowel gas in the upper abdomen. Visualized tracheal air column is within normal limits. IMPRESSION: No acute cardiopulmonary abnormality or acute traumatic injury identified. Electronically Signed   By: Odessa Fleming M.D.   On: 10/12/2022 10:13   DG  Lumbar Spine Complete  Result Date: 09/23/2022 CLINICAL DATA:  Fall EXAM: LUMBAR SPINE - COMPLETE 4+ VIEW COMPARISON:  None Available. FINDINGS: No evidence for lumbar spine fracture. Alignment is anatomic. Disc spaces are maintained. Minimal degenerative endplate osteophytes are seen throughout the lumbar spine. Left hip arthroplasty appears grossly within normal limits. IMPRESSION: No evidence for fracture. Minimal degenerative changes. Electronically Signed   By: Darliss Cheney M.D.   On: 09/23/2022 19:45   DG Hip Unilat W or Wo Pelvis 2-3 Views Left  Result Date: 09/23/2022 CLINICAL DATA:  Fall EXAM: DG HIP (WITH OR WITHOUT PELVIS) 2-3V LEFT COMPARISON:  None Available. FINDINGS: Left hip arthroplasty is present in anatomic alignment. No evidence for hardware loosening. No acute fracture. Joint spaces are maintained. IMPRESSION: Left hip arthroplasty in anatomic alignment. Electronically Signed   By: Mcneil Sober.D.  On: 09/23/2022 19:41    Microbiology: Results for orders placed or performed in visit on 09/22/17  Urine Culture     Status: None   Collection Time: 09/22/17 10:10 AM   Specimen: Urine, Random  Result Value Ref Range Status   Specimen Description   Final    URINE, RANDOM Performed at [facility], [address], [place], [place] [address]    Special Requests   Final    NONE Performed at [facility], [address], [place], [place] [address]    Culture   Final    NO GROWTH Performed at [facility], [address] [place], [place] [address]    Report Status 09/23/2017 FINAL  Final    Labs: CBC: Recent Labs  Lab 10/12/22 1726 10/12/22 1727 10/13/22 0542 10/14/22 0350 10/16/22 1115 10/17/22 0439  WBC 10.1 10.1 6.7 6.1 6.7 5.9  NEUTROABS 6.7 THIS TEST WAS ORDERED IN ERROR AND HAS BEEN CREDITED.  --   --   --   --   HGB 14.1 14.1 12.6* 11.8* 13.0 12.3*  HCT 42.2 41.9 37.8* 35.2* 40.0 37.4*  MCV 92.5 92.3 92.6 92.6 94.1 93.3   PLT 506* 477* 374 363 435* 371   Basic Metabolic Panel: Recent Labs  Lab 10/13/22 0542 10/14/22 0350 10/16/22 1115 10/17/22 0439 10/18/22 0430  NA 138 136 138 138 137  K 3.3* 3.7 3.5 3.6 3.8  CL 106 105 104 107 106  CO2 24 26 24 25 25   GLUCOSE 99 102* 79 93 95  BUN 25* 17 17 17  21*  CREATININE 1.72* 1.30* 2.17* 1.48* 1.24  CALCIUM 9.2 8.9 9.5 9.2 9.0  MG  --  1.9  --   --   --   PHOS  --  3.2  --   --   --    Liver Function Tests: Recent Labs  Lab 10/16/22 1319  AST 37  ALT 36  ALKPHOS 72  BILITOT 1.0  PROT 6.2*  ALBUMIN 3.6   CBG: No results for input(s): "GLUCAP" in the last 168 hours.  Discharge time spent: greater than 30 minutes.  Signed: Meredeth Ide, MD [facility] 10/18/2022

## 2022-10-18 NOTE — Evaluation (Signed)
Physical Therapy Evaluation Patient Details Name: Cody Burns MRN: [id number] DOB: [birth year] Today's Date: 10/18/2022  History of Present Illness  57 y.o. male with medical history significant of multiple sclerosis, HTN, hypothyroidism, CAD s/p CABG X 4, OSA, seizures as a child who presented for a fall.  He notes a long history of falls and black outs related to his MS; admitted last week for management of AKI, HTN and syncope, now here after fall/syncopal episode.  Clinical Impression  Pt pleasant and eager to work with PT. He did well and though he had some R lateral lean during ambulation and baseline MS weakness/gait alterations he ultimately showed the ability to manage at home.  Will benefit from continued PT to address safety and functional mobility.       Recommendations for follow up therapy are one component of a multi-disciplinary discharge planning process, led by the attending physician.  Recommendations may be updated based on patient status, additional functional criteria and insurance authorization.  Follow Up Recommendations       Assistance Recommended at Discharge PRN  Patient can return home with the following  A little help with walking and/or transfers;A little help with bathing/dressing/bathroom    Equipment Recommendations None recommended by PT  Recommendations for Other Services       Functional Status Assessment Patient has had a recent decline in their functional status and demonstrates the ability to make significant improvements in function in a reasonable and predictable amount of time.     Precautions / Restrictions Precautions Precautions: Fall Restrictions Weight Bearing Restrictions: No      Mobility  Bed Mobility Overal bed mobility: Modified Independent Bed Mobility: Sit to Supine, Supine to Sit     Supine to sit: Modified independent (Device/Increase time) Sit to supine: Modified independent (Device/Increase time)   General bed  mobility comments: easily transitions to/from sitting    Transfers Overall transfer level: Needs assistance Equipment used: Rolling walker (2 wheels) Transfers: Sit to/from Stand Sit to Stand: Supervision           General transfer comment: able to rise to standing from standard height bed X 2, appropriate use of UEs and ability to get weight forward over walker w/o assist    Ambulation/Gait Ambulation/Gait assistance: Supervision Gait Distance (Feet): 200 Feet Assistive device: Rolling walker (2 wheels)         General Gait Details: reciprocal stepping pattern with limited step height/length, mildly antalgic due to closed-chain R LE weakness; excessive R lateral weight shift, but no overt buckling/LOB  Stairs            Wheelchair Mobility    Modified Rankin (Stroke Patients Only)       Balance Overall balance assessment: Needs assistance Sitting-balance support: No upper extremity supported, Feet supported Sitting balance-Leahy Scale: Good   Postural control: Right lateral lean Standing balance support: Bilateral upper extremity supported Standing balance-Leahy Scale: Good Standing balance comment: no LOB or stagger stepping with walker use                             Pertinent Vitals/Pain Pain Assessment Pain Assessment:  (c/o chronic MS LE pain)    Home Living Family/patient expects to be discharged to:: Private residence Living Arrangements: Alone Available Help at Discharge: Family;Personal care attendant (PCA 2d/wk) Type of Home: House Home Access: Stairs to enter   [employer] of Steps: 1/2   Home Layout: One level Home Equipment:  Rolling Walker (2 wheels);Rollator (4 wheels);Shower seat - built in;Electric scooter;Cane - single point;BSC/3in1      Prior Function Prior Level of Function : Needs assist;Driving;History of Falls (last six months)             Mobility Comments: Sup/mod indep with rollator in house,  drives, limited community distances ADLs Comments: Pt reports PCA comes 2x/wk for 5-6 hours to assist with IADLs and shower. Pt endorses being mod I with use of rollator for all other needs during the week including driving. 15+ falls this year reported.     Hand Dominance        Extremity/Trunk Assessment   Upper Extremity Assessment Upper Extremity Assessment: Generalized weakness    Lower Extremity Assessment Lower Extremity Assessment: Generalized weakness       Communication   Communication: No difficulties  Cognition Arousal/Alertness: Awake/alert Behavior During Therapy: WFL for tasks assessed/performed Overall Cognitive Status: Within Functional Limits for tasks assessed                                          General Comments General comments (skin integrity, edema, etc.): Pt reports he is feeling much better than on arrival, close to his baseline and eager to continue working with PT at home to get him back feeling 100%    Exercises     Assessment/Plan    PT Assessment Patient needs continued PT services  PT Problem List Decreased activity tolerance;Decreased balance;Decreased mobility;Decreased coordination;Decreased knowledge of use of DME;Decreased safety awareness;Decreased knowledge of precautions       PT Treatment Interventions DME instruction;Gait training;Stair training;Functional mobility training;Therapeutic activities;Therapeutic exercise;Balance training;Neuromuscular re-education;Patient/family education    PT Goals (Current goals can be found in the Care Plan section)  Acute Rehab PT Goals Patient Stated Goal: to return home PT Goal Formulation: With patient/family Time For Goal Achievement: 10/31/22 Potential to Achieve Goals: Good    Frequency Min 2X/week     Co-evaluation               AM-PAC PT "6 Clicks" Mobility  Outcome Measure Help needed turning from your back to your side while in a flat bed without  using bedrails?: None Help needed moving from lying on your back to sitting on the side of a flat bed without using bedrails?: None Help needed moving to and from a bed to a chair (including a wheelchair)?: A Little Help needed standing up from a chair using your arms (e.g., wheelchair or bedside chair)?: A Little Help needed to walk in hospital room?: A Little Help needed climbing 3-5 steps with a railing? : A Little 6 Click Score: 20    End of Session Equipment Utilized During Treatment: Gait belt Activity Tolerance: Patient tolerated treatment well Patient left: in bed;with call bell/phone within reach;with bed alarm set Nurse Communication: Mobility status PT Visit Diagnosis: Muscle weakness (generalized) (M62.81);Difficulty in walking, not elsewhere classified (R26.2)    Time: 1326-1340 PT Time Calculation (min) (ACUTE ONLY): 14 min   Charges:   PT Evaluation $PT Eval Low Complexity: 1 Low          Malachi Pro, DPT 10/18/2022, 2:40 PM

## 2023-10-04 ENCOUNTER — Emergency Department

## 2023-10-04 ENCOUNTER — Other Ambulatory Visit: Payer: Self-pay

## 2023-10-04 ENCOUNTER — Inpatient Hospital Stay
Admission: EM | Admit: 2023-10-04 | Discharge: 2023-10-08 | DRG: 291 | Disposition: A | Discharge status: Skilled Nursing Facility | Attending: Student | Admitting: Student

## 2023-10-04 ENCOUNTER — Inpatient Hospital Stay

## 2023-10-04 ENCOUNTER — Encounter: Payer: Self-pay | Admitting: Emergency Medicine

## 2023-10-04 DIAGNOSIS — G4730 Sleep apnea, unspecified: Secondary | ICD-10-CM | POA: Diagnosis present

## 2023-10-04 DIAGNOSIS — I517 Cardiomegaly: Secondary | ICD-10-CM | POA: Diagnosis not present

## 2023-10-04 DIAGNOSIS — Z7902 Long term (current) use of antithrombotics/antiplatelets: Secondary | ICD-10-CM

## 2023-10-04 DIAGNOSIS — J811 Chronic pulmonary edema: Secondary | ICD-10-CM | POA: Diagnosis not present

## 2023-10-04 DIAGNOSIS — I2510 Atherosclerotic heart disease of native coronary artery without angina pectoris: Secondary | ICD-10-CM | POA: Diagnosis present

## 2023-10-04 DIAGNOSIS — I951 Orthostatic hypotension: Secondary | ICD-10-CM | POA: Diagnosis present

## 2023-10-04 DIAGNOSIS — Z043 Encounter for examination and observation following other accident: Secondary | ICD-10-CM | POA: Diagnosis not present

## 2023-10-04 DIAGNOSIS — S0003XA Contusion of scalp, initial encounter: Secondary | ICD-10-CM | POA: Diagnosis present

## 2023-10-04 DIAGNOSIS — Z951 Presence of aortocoronary bypass graft: Secondary | ICD-10-CM

## 2023-10-04 DIAGNOSIS — F1010 Alcohol abuse, uncomplicated: Secondary | ICD-10-CM | POA: Diagnosis present

## 2023-10-04 DIAGNOSIS — I4892 Unspecified atrial flutter: Secondary | ICD-10-CM | POA: Diagnosis present

## 2023-10-04 DIAGNOSIS — Z604 Social exclusion and rejection: Secondary | ICD-10-CM | POA: Diagnosis present

## 2023-10-04 DIAGNOSIS — W1830XA Fall on same level, unspecified, initial encounter: Secondary | ICD-10-CM | POA: Diagnosis present

## 2023-10-04 DIAGNOSIS — Z79899 Other long term (current) drug therapy: Secondary | ICD-10-CM

## 2023-10-04 DIAGNOSIS — Z880 Allergy status to penicillin: Secondary | ICD-10-CM

## 2023-10-04 DIAGNOSIS — F419 Anxiety disorder, unspecified: Secondary | ICD-10-CM | POA: Diagnosis present

## 2023-10-04 DIAGNOSIS — R296 Repeated falls: Secondary | ICD-10-CM | POA: Diagnosis not present

## 2023-10-04 DIAGNOSIS — Z743 Need for continuous supervision: Secondary | ICD-10-CM | POA: Diagnosis not present

## 2023-10-04 DIAGNOSIS — M47814 Spondylosis without myelopathy or radiculopathy, thoracic region: Secondary | ICD-10-CM | POA: Diagnosis not present

## 2023-10-04 DIAGNOSIS — W19XXXA Unspecified fall, initial encounter: Secondary | ICD-10-CM | POA: Diagnosis not present

## 2023-10-04 DIAGNOSIS — I5033 Acute on chronic diastolic (congestive) heart failure: Secondary | ICD-10-CM | POA: Diagnosis not present

## 2023-10-04 DIAGNOSIS — E785 Hyperlipidemia, unspecified: Secondary | ICD-10-CM | POA: Diagnosis present

## 2023-10-04 DIAGNOSIS — E611 Iron deficiency: Secondary | ICD-10-CM | POA: Diagnosis present

## 2023-10-04 DIAGNOSIS — Z888 Allergy status to other drugs, medicaments and biological substances status: Secondary | ICD-10-CM

## 2023-10-04 DIAGNOSIS — E66811 Obesity, class 1: Secondary | ICD-10-CM | POA: Diagnosis present

## 2023-10-04 DIAGNOSIS — Z96642 Presence of left artificial hip joint: Secondary | ICD-10-CM | POA: Diagnosis present

## 2023-10-04 DIAGNOSIS — R0602 Shortness of breath: Secondary | ICD-10-CM | POA: Diagnosis not present

## 2023-10-04 DIAGNOSIS — Z6831 Body mass index (BMI) 31.0-31.9, adult: Secondary | ICD-10-CM | POA: Diagnosis not present

## 2023-10-04 DIAGNOSIS — Z8249 Family history of ischemic heart disease and other diseases of the circulatory system: Secondary | ICD-10-CM

## 2023-10-04 DIAGNOSIS — Z7984 Long term (current) use of oral hypoglycemic drugs: Secondary | ICD-10-CM

## 2023-10-04 DIAGNOSIS — Z8673 Personal history of transient ischemic attack (TIA), and cerebral infarction without residual deficits: Secondary | ICD-10-CM

## 2023-10-04 DIAGNOSIS — I509 Heart failure, unspecified: Secondary | ICD-10-CM | POA: Diagnosis not present

## 2023-10-04 DIAGNOSIS — S0990XA Unspecified injury of head, initial encounter: Secondary | ICD-10-CM | POA: Diagnosis not present

## 2023-10-04 DIAGNOSIS — E039 Hypothyroidism, unspecified: Secondary | ICD-10-CM | POA: Diagnosis present

## 2023-10-04 DIAGNOSIS — I10 Essential (primary) hypertension: Secondary | ICD-10-CM | POA: Diagnosis not present

## 2023-10-04 DIAGNOSIS — Z7982 Long term (current) use of aspirin: Secondary | ICD-10-CM | POA: Diagnosis not present

## 2023-10-04 DIAGNOSIS — Z955 Presence of coronary angioplasty implant and graft: Secondary | ICD-10-CM

## 2023-10-04 DIAGNOSIS — I110 Hypertensive heart disease with heart failure: Secondary | ICD-10-CM | POA: Diagnosis not present

## 2023-10-04 DIAGNOSIS — Z79890 Hormone replacement therapy (postmenopausal): Secondary | ICD-10-CM

## 2023-10-04 DIAGNOSIS — E538 Deficiency of other specified B group vitamins: Secondary | ICD-10-CM | POA: Diagnosis present

## 2023-10-04 DIAGNOSIS — G35 Multiple sclerosis: Secondary | ICD-10-CM | POA: Diagnosis present

## 2023-10-04 DIAGNOSIS — I252 Old myocardial infarction: Secondary | ICD-10-CM

## 2023-10-04 DIAGNOSIS — J9601 Acute respiratory failure with hypoxia: Secondary | ICD-10-CM

## 2023-10-04 LAB — CBC WITH DIFFERENTIAL/PLATELET
Abs Immature Granulocytes: 0.03 10*3/uL (ref 0.00–0.07)
Basophils Absolute: 0 10*3/uL (ref 0.0–0.1)
Basophils Relative: 0 %
Eosinophils Absolute: 0.2 10*3/uL (ref 0.0–0.5)
Eosinophils Relative: 2 %
HCT: 34.7 % — ABNORMAL LOW (ref 39.0–52.0)
Hemoglobin: 11 g/dL — ABNORMAL LOW (ref 13.0–17.0)
Immature Granulocytes: 0 %
Lymphocytes Relative: 23 %
Lymphs Abs: 1.6 10*3/uL (ref 0.7–4.0)
MCH: 29.6 pg (ref 26.0–34.0)
MCHC: 31.7 g/dL (ref 30.0–36.0)
MCV: 93.3 fL (ref 80.0–100.0)
Monocytes Absolute: 0.8 10*3/uL (ref 0.1–1.0)
Monocytes Relative: 11 %
Neutro Abs: 4.4 10*3/uL (ref 1.7–7.7)
Neutrophils Relative %: 64 %
Platelets: 273 10*3/uL (ref 150–400)
RBC: 3.72 MIL/uL — ABNORMAL LOW (ref 4.22–5.81)
RDW: 15.9 % — ABNORMAL HIGH (ref 11.5–15.5)
WBC: 6.9 10*3/uL (ref 4.0–10.5)
nRBC: 0 % (ref 0.0–0.2)

## 2023-10-04 LAB — COMPREHENSIVE METABOLIC PANEL WITH GFR
ALT: 17 U/L (ref 0–44)
AST: 30 U/L (ref 15–41)
Albumin: 3.4 g/dL — ABNORMAL LOW (ref 3.5–5.0)
Alkaline Phosphatase: 76 U/L (ref 38–126)
Anion gap: 7 (ref 5–15)
BUN: 13 mg/dL (ref 6–20)
CO2: 24 mmol/L (ref 22–32)
Calcium: 8.7 mg/dL — ABNORMAL LOW (ref 8.9–10.3)
Chloride: 112 mmol/L — ABNORMAL HIGH (ref 98–111)
Creatinine, Ser: 1.2 mg/dL (ref 0.61–1.24)
GFR, Estimated: >60 mL/min (ref >60)
Glucose, Bld: 81 mg/dL (ref 70–99)
Potassium: 4.2 mmol/L (ref 3.5–5.1)
Sodium: 143 mmol/L (ref 135–145)
Total Bilirubin: 1.3 mg/dL — ABNORMAL HIGH (ref 0.0–1.2)
Total Protein: 6.3 g/dL — ABNORMAL LOW (ref 6.5–8.1)

## 2023-10-04 LAB — TROPONIN I (HIGH SENSITIVITY): Troponin I (High Sensitivity): 35 ng/L — ABNORMAL HIGH (ref <18)

## 2023-10-04 LAB — VITAMIN B12: Vitamin B-12: 138 pg/mL — ABNORMAL LOW (ref 180–914)

## 2023-10-04 LAB — MAGNESIUM: Magnesium: 2.3 mg/dL (ref 1.7–2.4)

## 2023-10-04 LAB — TSH: TSH: 2.841 u[IU]/mL (ref 0.350–4.500)

## 2023-10-04 LAB — PHOSPHORUS: Phosphorus: 4.3 mg/dL (ref 2.5–4.6)

## 2023-10-04 LAB — VITAMIN D 25 HYDROXY (VIT D DEFICIENCY, FRACTURES): Vit D, 25-Hydroxy: 53.53 ng/mL (ref 30–100)

## 2023-10-04 LAB — BRAIN NATRIURETIC PEPTIDE: B Natriuretic Peptide: 506.4 pg/mL — ABNORMAL HIGH (ref 0.0–100.0)

## 2023-10-04 MED ORDER — ENOXAPARIN SODIUM 60 MG/0.6ML IJ SOSY
50.0000 mg | PREFILLED_SYRINGE | INTRAMUSCULAR | Status: DC
  Administered 2023-10-04 – 2023-10-07 (×4): 50 mg via SUBCUTANEOUS
  Filled 2023-10-04 (×5): qty 0.6

## 2023-10-04 MED ORDER — TIZANIDINE HCL 4 MG PO TABS
4.0000 mg | ORAL_TABLET | Freq: Three times a day (TID) | ORAL | Status: DC | PRN
  Administered 2023-10-05: 4 mg via ORAL
  Filled 2023-10-04: qty 1

## 2023-10-04 MED ORDER — IPRATROPIUM-ALBUTEROL 0.5-2.5 (3) MG/3ML IN SOLN: 3.0000 mL | Freq: Four times a day (QID) | RESPIRATORY_TRACT | Status: DC | PRN

## 2023-10-04 MED ORDER — QUETIAPINE FUMARATE 25 MG PO TABS
100.0000 mg | ORAL_TABLET | Freq: Every day | ORAL | Status: DC
  Administered 2023-10-04 – 2023-10-07 (×4): 100 mg via ORAL
  Filled 2023-10-04 (×4): qty 4

## 2023-10-04 MED ORDER — FUROSEMIDE 10 MG/ML IJ SOLN
40.0000 mg | Freq: Every day | INTRAMUSCULAR | Status: DC
  Administered 2023-10-04 – 2023-10-05 (×2): 40 mg via INTRAVENOUS
  Filled 2023-10-04 (×2): qty 4

## 2023-10-04 MED ORDER — BUPROPION HCL ER (XL) 150 MG PO TB24
300.0000 mg | ORAL_TABLET | Freq: Every day | ORAL | Status: DC
  Administered 2023-10-05 – 2023-10-08 (×4): 300 mg via ORAL
  Filled 2023-10-04 (×4): qty 2

## 2023-10-04 MED ORDER — LAMOTRIGINE 25 MG PO TABS
25.0000 mg | ORAL_TABLET | Freq: Every day | ORAL | Status: DC
  Administered 2023-10-05 – 2023-10-08 (×4): 25 mg via ORAL
  Filled 2023-10-04 (×4): qty 1

## 2023-10-04 MED ORDER — HYDROXYZINE HCL 10 MG PO TABS: 20.0000 mg | ORAL_TABLET | Freq: Once | ORAL | Status: DC | PRN

## 2023-10-04 MED ORDER — ONDANSETRON HCL 4 MG/2ML IJ SOLN: 4.0000 mg | Freq: Four times a day (QID) | INTRAMUSCULAR | Status: DC | PRN

## 2023-10-04 MED ORDER — ACETAMINOPHEN 325 MG PO TABS
650.0000 mg | ORAL_TABLET | Freq: Four times a day (QID) | ORAL | Status: DC | PRN
  Administered 2023-10-05: 650 mg via ORAL
  Filled 2023-10-04: qty 2

## 2023-10-04 MED ORDER — ATORVASTATIN CALCIUM 80 MG PO TABS
80.0000 mg | ORAL_TABLET | Freq: Every day | ORAL | Status: DC
  Administered 2023-10-04 – 2023-10-07 (×4): 80 mg via ORAL
  Filled 2023-10-04 (×4): qty 1

## 2023-10-04 MED ORDER — MONTELUKAST SODIUM 10 MG PO TABS
10.0000 mg | ORAL_TABLET | Freq: Every day | ORAL | Status: DC
  Administered 2023-10-05 – 2023-10-08 (×4): 10 mg via ORAL
  Filled 2023-10-04 (×4): qty 1

## 2023-10-04 MED ORDER — ESCITALOPRAM OXALATE 10 MG PO TABS
20.0000 mg | ORAL_TABLET | Freq: Every day | ORAL | Status: DC
  Administered 2023-10-05 – 2023-10-08 (×4): 20 mg via ORAL
  Filled 2023-10-04 (×4): qty 2

## 2023-10-04 MED ORDER — KETOROLAC TROMETHAMINE 30 MG/ML IJ SOLN
30.0000 mg | Freq: Once | INTRAMUSCULAR | Status: AC
  Administered 2023-10-04: 30 mg via INTRAMUSCULAR
  Filled 2023-10-04: qty 1

## 2023-10-04 MED ORDER — ACETAMINOPHEN 650 MG RE SUPP: 650.0000 mg | Freq: Four times a day (QID) | RECTAL | Status: DC | PRN

## 2023-10-04 MED ORDER — IPRATROPIUM-ALBUTEROL 0.5-2.5 (3) MG/3ML IN SOLN
3.0000 mL | Freq: Once | RESPIRATORY_TRACT | Status: AC
  Administered 2023-10-04: 3 mL via RESPIRATORY_TRACT
  Filled 2023-10-04: qty 3

## 2023-10-04 MED ORDER — SENNOSIDES-DOCUSATE SODIUM 8.6-50 MG PO TABS
1.0000 | ORAL_TABLET | Freq: Every evening | ORAL | Status: DC | PRN
  Administered 2023-10-06: 1 via ORAL
  Filled 2023-10-04: qty 1

## 2023-10-04 MED ORDER — PREGABALIN 75 MG PO CAPS
150.0000 mg | ORAL_CAPSULE | Freq: Three times a day (TID) | ORAL | Status: DC
Start: 2023-10-04 — End: 2023-10-08
  Administered 2023-10-04 – 2023-10-08 (×11): 150 mg via ORAL
  Filled 2023-10-04 (×11): qty 2

## 2023-10-04 MED ORDER — ONDANSETRON HCL 4 MG PO TABS: 4.0000 mg | ORAL_TABLET | Freq: Four times a day (QID) | ORAL | Status: DC | PRN

## 2023-10-04 MED ORDER — EZETIMIBE 10 MG PO TABS
10.0000 mg | ORAL_TABLET | Freq: Every day | ORAL | Status: DC
  Administered 2023-10-05 – 2023-10-08 (×4): 10 mg via ORAL
  Filled 2023-10-04 (×4): qty 1

## 2023-10-04 MED ORDER — ASPIRIN 81 MG PO TBEC
81.0000 mg | DELAYED_RELEASE_TABLET | Freq: Every day | ORAL | Status: DC
  Administered 2023-10-05 – 2023-10-08 (×4): 81 mg via ORAL
  Filled 2023-10-04 (×4): qty 1

## 2023-10-04 MED ORDER — FOLIC ACID 1 MG PO TABS
500.0000 ug | ORAL_TABLET | Freq: Every day | ORAL | Status: DC
  Administered 2023-10-05 – 2023-10-08 (×4): 0.5 mg via ORAL
  Filled 2023-10-04 (×4): qty 1

## 2023-10-04 MED ORDER — CLOPIDOGREL BISULFATE 75 MG PO TABS
75.0000 mg | ORAL_TABLET | Freq: Every day | ORAL | Status: DC
  Administered 2023-10-05 – 2023-10-08 (×4): 75 mg via ORAL
  Filled 2023-10-04 (×4): qty 1

## 2023-10-04 MED ORDER — OXCARBAZEPINE 150 MG PO TABS
150.0000 mg | ORAL_TABLET | Freq: Two times a day (BID) | ORAL | Status: DC
Start: 2023-10-04 — End: 2023-10-08
  Administered 2023-10-04 – 2023-10-08 (×8): 150 mg via ORAL
  Filled 2023-10-04 (×8): qty 1

## 2023-10-04 NOTE — ED Notes (Signed)
 Low oxygen alarm going off in pt's room, pt oxygen reading at 88%. Increased oxygen to 3L with O2 increasing to 100%. Pt states he has been SOB since falling. EDP Kinner made aware.

## 2023-10-04 NOTE — ED Provider Notes (Signed)
 [facility] Provider Note    Event Date/Time   First MD Initiated Contact with Patient 10/04/23 1158     (approximate)   History   Fall (/)   HPI  Cody Burns is a 58 y.o. male with a history of MS, alcohol abuse, orthostatic hypotension who presents after a fall.  Complains only of mid back pain.  He does take Plavix  and aspirin      Physical Exam   Triage Vital Signs: ED Triage Vitals  Encounter Vitals Group     BP 10/04/23 1103 (!) 86/65     Systolic BP Percentile --      Diastolic BP Percentile --      Pulse Rate 10/04/23 1103 (!) 109     Resp 10/04/23 1103 18     Temp 10/04/23 1103 98.1 F (36.7 C)     Temp src --      SpO2 10/04/23 1103 100 %     Weight 10/04/23 1100 100.4 kg (221 lb 5.5 oz)     Height 10/04/23 1100 1.803 m (5\' 11" )     Head Circumference --      Peak Flow --      Pain Score 10/04/23 1100 7     Pain Loc --      Pain Education --      Exclude from Growth Chart --     Most recent vital signs: Vitals:   10/04/23 1103 10/04/23 1334  BP: (!) 86/65 (!) 125/97  Pulse: (!) 109 (!) 109  Resp: 18 18  Temp: 98.1 F (36.7 C)   SpO2: 100% 100%     General: Awake, no distress.  CV:  Good peripheral perfusion.  Resp:  Normal effort.  Abd:  No distention.  Other:  Old bruising to the face, bruise to the right upper back and right lower back also looks old, no vertebral tenderness to palpation, no swelling lacerations to the back.  Normal range of motion of the upper and lower extremities   ED Results / Procedures / Treatments   Labs (all labs ordered are listed, but only abnormal results are displayed) Labs Reviewed  CBC WITH DIFFERENTIAL/PLATELET - Abnormal; Notable for the following components:      Result Value   RBC 3.72 (*)    Hemoglobin 11.0 (*)    HCT 34.7 (*)    RDW 15.9 (*)    All other components within normal limits  COMPREHENSIVE METABOLIC PANEL WITH GFR - Abnormal; Notable for the following  components:   Chloride 112 (*)    Calcium  8.7 (*)    Total Protein 6.3 (*)    Albumin 3.4 (*)    Total Bilirubin 1.3 (*)    All other components within normal limits  TROPONIN I (HIGH SENSITIVITY) - Abnormal; Notable for the following components:   Troponin I (High Sensitivity) 35 (*)    All other components within normal limits  BRAIN NATRIURETIC PEPTIDE     EKG     RADIOLOGY Lumbar x-ray viewed to read by me, no evidence of compression fracture    PROCEDURES:  Critical Care performed:   Procedures   MEDICATIONS ORDERED IN ED: Medications  ketorolac  (TORADOL ) 30 MG/ML injection 30 mg (30 mg Intramuscular Given 10/04/23 1251)  ipratropium-albuterol (DUONEB) 0.5-2.5 (3) MG/3ML nebulizer solution 3 mL (3 mLs Nebulization Given 10/04/23 1316)     IMPRESSION / MDM / ASSESSMENT AND PLAN / ED COURSE  I reviewed the triage vital signs and  the nursing notes. Patient's presentation is most consistent with acute presentation with potential threat to life or bodily function.  Patient presents with a fall with back pain as detailed above.  Initial blood pressure in triage was soft however on my exam patient's blood pressure was 109/67.  He is not tachycardic on my exam.  Does have a history of falls as well as orthostatic hypotension.  Review of medical record demonstrates the patient was admitted 1 year ago for similar issues at that time he had an AKI  Lab work today is overall reassuring, chronically elevated troponin, normal kidney function.  Notified by nurse that patient's room air saturation dropped to 88%, I verified this we started him on 2 L nasal cannula with good response.  Will add on chest x-ray  Thoracic spine x-ray without compression fracture, chest x-ray does demonstrate cardiomegaly with edema this is likely the cause of his shortness of breath, suspect new onset CHF, given new oxygen requirement will consult the hospitalist for admission       FINAL  CLINICAL IMPRESSION(S) / ED DIAGNOSES   Final diagnoses:  Fall, initial encounter  Acute congestive heart failure, unspecified heart failure type (HCC)     Rx / DC Orders   ED Discharge Orders     None        Note:  This document was prepared using Dragon voice recognition software and may include unintentional dictation errors.   Bryson Carbine, MD 10/04/23 1455

## 2023-10-04 NOTE — ED Notes (Signed)
 Patient stated he felt like he was gasping for air. Patient placed on 2 lpm of oxygen via nasal cannula for comfort. Patient oxygen saturation off of oxygen was steady in the high 90s

## 2023-10-04 NOTE — ED Notes (Signed)
 ED TO INPATIENT HANDOFF REPORT  ED Nurse Name and Phone #:   S Name/Age/Gender Cody Burns 58 y.o. male Room/Bed: ED31A/ED31A  Code Status   Code Status: Full Code  Home/SNF/Other Home Patient oriented to: self, place, time, and situation Is this baseline? Yes   Triage Complete: Triage complete  Chief Complaint Acute on chronic diastolic (congestive) heart failure (HCC) [I50.33]  Triage Note Presents  via EMS from home S/P  states his legs became weak weak  fell  Hitting head   No LOC   20 g in right hand 500 bolus  NS FSBS 50 was given 50 PTA   Allergies Allergies  Allergen Reactions   Hydrochlorothiazide-Triamterene Other (See Comments) and Palpitations    High blood pressure   Metoprolol  Other (See Comments) and Palpitations    Exacerbates MS, High blood pressure   Penicillins Other (See Comments)    Sores in mouth  Has patient had a PCN reaction causing immediate rash, facial/tongue/throat swelling, SOB or lightheadedness with hypotension: No  Has patient had a PCN reaction causing severe rash involving mucus membranes or skin necrosis: No  Has patient had a PCN reaction that required hospitalization: No  Has patient had a PCN reaction occurring within the last 10 years: No  If all of the above answers are "NO", then may proceed with Cephalosporin use.  MOUTH SORES   Aripiprazole Other (See Comments)    WORSENED SLEEP WALKING    Level of Care/Admitting Diagnosis ED Disposition     ED Disposition  Admit   Condition  --   Comment  Hospital Area: [facility] [[id number]]  Level of Care: Telemetry Medical [104]  Covid Evaluation: Asymptomatic - no recent exposure (last 10 days) testing not required  Diagnosis: Acute on chronic diastolic (congestive) heart failure [facility]) [[id number]]  Admitting Physician: Vita Grip [[id number]]  Attending Physician: Vita Grip [[id number]]  Certification:: I certify this patient will need  inpatient services for at least 2 midnights  Expected Medical Readiness: 10/07/2023          B Medical/Surgery History Past Medical History:  Diagnosis Date   Blackout 2018   several episodes with unknown etiology. cause of his ankle break. awaiting neuro workup   CVA (cerebral vascular accident) (HCC)    Hypertension    Hypothyroidism    Multiple sclerosis (HCC) 1990   Myocardial infarct (HCC)    Optic neuritis    x 3. takes steriods and it resolves.   Seizures (HCC)    as a child. diagnosed with MS and once treated, seizures resolved.   Sleep apnea    Past Surgical History:  Procedure Laterality Date   CORONARY ARTERY BYPASS GRAFT  2015   CORONARY STENT INTERVENTION N/A 06/07/2017   Procedure: CORONARY STENT INTERVENTION;  Surgeon: Antonette Batters, MD;  Location: [facility] INVASIVE CV LAB;  Service: Cardiovascular;  Laterality: N/A;   JOINT REPLACEMENT Left 2007   left hip replacement   LEFT HEART CATH AND CORS/GRAFTS ANGIOGRAPHY N/A 06/07/2017   Procedure: LEFT HEART CATH AND CORS/GRAFTS ANGIOGRAPHY;  Surgeon: Michelle Aid, MD;  Location: [facility] INVASIVE CV LAB;  Service: Cardiovascular;  Laterality: N/A;   ORIF ANKLE FRACTURE Right 09/22/2016   Procedure: OPEN REDUCTION INTERNAL FIXATION (ORIF) ANKLE FRACTURE;  Surgeon: Elner Hahn, MD;  Location: [facility] ORS;  Service: Orthopedics;  Laterality: Right;   quadruple cardiac bypass  2015     A IV Location/Drains/Wounds Patient Lines/Drains/Airways Status     Active  Line/Drains/Airways     Name Placement date Placement time Site Days   Peripheral IV 10/04/23 20 G Right Hand 10/04/23  1103  Hand  less than 1   External Urinary Catheter 10/04/23  1800  --  less than 1            Intake/Output Last 24 hours  Intake/Output Summary (Last 24 hours) at 10/04/2023 2102 Last data filed at 10/04/2023 1707 Gross per 24 hour  Intake --  Output 600 ml  Net -600 ml    Labs/Imaging Results for orders placed or performed  during the hospital encounter of 10/04/23 (from the past 48 hours)  CBC with Differential     Status: Abnormal   Collection Time: 10/04/23 11:05 AM  Result Value Ref Range   WBC 6.9 4.0 - 10.5 K/uL   RBC 3.72 (L) 4.22 - 5.81 MIL/uL   Hemoglobin 11.0 (L) 13.0 - 17.0 g/dL   HCT 83.1 (L) 51.7 - 61.6 %   MCV 93.3 80.0 - 100.0 fL   MCH 29.6 26.0 - 34.0 pg   MCHC 31.7 30.0 - 36.0 g/dL   RDW 07.3 (H) 71.0 - 62.6 %   Platelets 273 150 - 400 K/uL   nRBC 0.0 0.0 - 0.2 %   Neutrophils Relative % 64 %   Neutro Abs 4.4 1.7 - 7.7 K/uL   Lymphocytes Relative 23 %   Lymphs Abs 1.6 0.7 - 4.0 K/uL   Monocytes Relative 11 %   Monocytes Absolute 0.8 0.1 - 1.0 K/uL   Eosinophils Relative 2 %   Eosinophils Absolute 0.2 0.0 - 0.5 K/uL   Basophils Relative 0 %   Basophils Absolute 0.0 0.0 - 0.1 K/uL   Immature Granulocytes 0 %   Abs Immature Granulocytes 0.03 0.00 - 0.07 K/uL    Comment: Performed at [facility], [address] [place], [place] [address]  Troponin I (High Sensitivity)     Status: Abnormal   Collection Time: 10/04/23 11:05 AM  Result Value Ref Range   Troponin I (High Sensitivity) 35 (H) <18 ng/L    Comment: (NOTE) Elevated high sensitivity troponin I (hsTnI) values and significant  changes across serial measurements may suggest ACS but many other  chronic and acute conditions are known to elevate hsTnI results.  Refer to the "Links" section for chest pain algorithms and additional  guidance. Performed at [facility], [address] [place], [place] [address]   Brain natriuretic peptide     Status: Abnormal   Collection Time: 10/04/23 11:05 AM  Result Value Ref Range   B Natriuretic Peptide 506.4 (H) 0.0 - 100.0 pg/mL    Comment: Performed at [facility], [address] [place], [place] [address]  Comprehensive metabolic panel with GFR     Status: Abnormal   Collection Time: 10/04/23 11:54 AM  Result Value Ref Range   Sodium 143 135 - 145  mmol/L   Potassium 4.2 3.5 - 5.1 mmol/L   Chloride 112 (H) 98 - 111 mmol/L   CO2 24 22 - 32 mmol/L   Glucose, Bld 81 70 - 99 mg/dL    Comment: Glucose reference range applies only to samples taken after fasting for at least 8 hours.   BUN 13 6 - 20 mg/dL   Creatinine, Ser 8.18 0.61 - 1.24 mg/dL   Calcium  8.7 (L) 8.9 - 10.3 mg/dL   Total Protein 6.3 (L) 6.5 - 8.1 g/dL   Albumin 3.4 (L) 3.5 - 5.0  g/dL   AST 30 15 - 41 U/L   ALT 17 0 - 44 U/L   Alkaline Phosphatase 76 38 - 126 U/L   Total Bilirubin 1.3 (H) 0.0 - 1.2 mg/dL   GFR, Estimated >09 >81 mL/min    Comment: (NOTE) Calculated using the CKD-EPI Creatinine Equation (2021)    Anion gap 7 5 - 15    Comment: Performed at [facility], [address] [place], [place] [address]  TSH     Status: None   Collection Time: 10/04/23  3:36 PM  Result Value Ref Range   TSH 2.841 0.350 - 4.500 uIU/mL    Comment: Performed by a 3rd Generation assay with a functional sensitivity of <=0.01 uIU/mL. Performed at [facility], [address] [place], [place] [address]   Magnesium      Status: None   Collection Time: 10/04/23  3:36 PM  Result Value Ref Range   Magnesium  2.3 1.7 - 2.4 mg/dL    Comment: Performed at [facility], [address] [place], [place] [address]  Phosphorus     Status: None   Collection Time: 10/04/23  3:36 PM  Result Value Ref Range   Phosphorus 4.3 2.5 - 4.6 mg/dL    Comment: Performed at [facility], [address] [place], [place] [address]   CT HEAD WO CONTRAST ( ) Result Date: 10/04/2023 CLINICAL DATA:  Head trauma, coagulopathy (Age 20-64y) Head trauma, skull fracture or hematoma (Age 25-64y) EXAM: CT HEAD WITHOUT CONTRAST TECHNIQUE: Contiguous axial images were obtained from the base of the skull through the vertex without intravenous contrast. RADIATION DOSE REDUCTION: This exam was performed according to the departmental dose-optimization program which includes  automated exposure control, adjustment of the mA and/or kV according to patient size and/or use of iterative reconstruction technique. COMPARISON:  Head CT 10/16/2022 FINDINGS: Brain: No acute intracranial hemorrhage. No subdural or extra-axial collection. Unchanged atrophy, advanced for age. Stable periventricular and deep white matter hypodensities. Unchanged lacunar infarct in the left basal ganglia. Vascular: Atherosclerosis of skullbase vasculature without hyperdense vessel or abnormal calcification. Skull: Left-sided craniotomy.  No skull fracture. Sinuses/Orbits: No acute finding. Other: Frontal scalp hematoma to the left of midline. IMPRESSION: 1. Frontal scalp hematoma to the left of midline. No acute intracranial abnormality. No skull fracture. 2. Unchanged atrophy, remote left basal gangliar lacunar infarct, and periventricular white matter change. Electronically Signed   By: Chadwick Colonel M.D.   On: 10/04/2023 16:50   DG Chest Port 1 View Result Date: 10/04/2023 CLINICAL DATA:  Shortness of breath EXAM: PORTABLE CHEST 1 VIEW COMPARISON:  Chest radiograph 10/12/2022 FINDINGS: Cardiomegaly. Status post median sternotomy. Pulmonary vascular redistribution and mild interstitial opacities. No pleural effusion or pneumothorax. IMPRESSION: Cardiomegaly with mild edema. Electronically Signed   By: Jone Neither M.D.   On: 10/04/2023 14:38   DG Thoracic Spine 2 View Result Date: 10/04/2023 CLINICAL DATA:  Status post fall EXAM: THORACIC SPINE 2 VIEWS COMPARISON:  None Available. 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 Electronically Signed   By: Fredrich Jefferson M.D.   On: 10/04/2023 14:02    Pending Labs Unresulted Labs (From admission, onward)     Start      Ordered   10/04/23 1517  Vitamin B12  Once,   R  10/04/23 1517   10/04/23 1516  VITAMIN D  25 Hydroxy (Vit-D Deficiency, Fractures)  Once,   R        10/04/23 1517            Vitals/Pain Today's Vitals   10/04/23 1800 10/04/23 1900 10/04/23 2000 10/04/23 2100  BP: 106/79 112/81 (!) 131/98 (!) 140/92  Pulse: (!) 102 98 (!) 111 (!) 113  Resp: 17 16 18    Temp: 97.8 F (36.6 C)     TempSrc:      SpO2: 100% 100% 100% 100%  Weight:      Height:      PainSc:        Isolation Precautions No active isolations  Medications Medications  furosemide (LASIX) injection 40 mg (40 mg Intravenous Given 10/04/23 1530)  enoxaparin  (LOVENOX ) injection 50 mg (has no administration in time range)  acetaminophen  (TYLENOL ) tablet 650 mg (has no administration in time range)    Or  acetaminophen  (TYLENOL ) suppository 650 mg (has no administration in time range)  senna-docusate (Senokot-S) tablet 1 tablet (has no administration in time range)  ondansetron  (ZOFRAN ) tablet 4 mg (has no administration in time range)    Or  ondansetron  (ZOFRAN ) injection 4 mg (has no administration in time range)  aspirin  EC tablet 81 mg (has no administration in time range)  atorvastatin  (LIPITOR) tablet 80 mg (has no administration in time range)  buPROPion  (WELLBUTRIN  XL) 24 hr tablet 300 mg (has no administration in time range)  clopidogrel  (PLAVIX ) tablet 75 mg (has no administration in time range)  escitalopram  (LEXAPRO ) tablet 20 mg (has no administration in time range)  ezetimibe (ZETIA) tablet 10 mg (has no administration in time range)  folic acid  (FOLVITE ) tablet 0.5 mg (has no administration in time range)  hydrOXYzine (ATARAX) tablet 20 mg (has no administration in time range)  lamoTRIgine  (LAMICTAL ) tablet 25 mg (has no administration in time range)  montelukast  (SINGULAIR ) tablet 10 mg (has no administration in time range)  OXcarbazepine  (TRILEPTAL ) tablet 150 mg (has no administration in time  range)  pregabalin (LYRICA) capsule 150 mg (has no administration in time range)  QUEtiapine  (SEROQUEL ) tablet 100 mg (has no administration in time range)  tiZANidine  (ZANAFLEX ) tablet 4 mg (has no administration in time range)  ipratropium-albuterol (DUONEB) 0.5-2.5 (3) MG/3ML nebulizer solution 3 mL (has no administration in time range)  ketorolac  (TORADOL ) 30 MG/ML injection 30 mg (30 mg Intramuscular Given 10/04/23 1251)  ipratropium-albuterol (DUONEB) 0.5-2.5 (3) MG/3ML nebulizer solution 3 mL (3 mLs Nebulization Given 10/04/23 1316)    Mobility walks with device     Focused Assessments    R Recommendations: See Admitting Provider Note  Report given to:   Additional Notes:  High fall risk, falls at home, uses walker at home

## 2023-10-04 NOTE — ED Triage Notes (Signed)
 Presents  via EMS from home S/P  states his legs became weak weak  fell  Hitting head   No LOC   20 g in right hand 500 bolus  NS FSBS 50 was given 50 PTA

## 2023-10-04 NOTE — Progress Notes (Signed)
 Pt's BP 160/110. MD. Guillermo Lees made aware. No orders at this time. MD. Suggest to follow existing orders. Pt is asymptomatic. No distress noted. No c/o. Will continue to monitor.

## 2023-10-04 NOTE — H&P (Addendum)
 History and Physical  Cody Burns ZOX:[id number] DOB: [birth year] DOA: 10/04/2023  PCP: [facility]   Chief Complaint: Fall, SOB  HPI: Cody Burns is a 58 y.o. male with medical history significant for CVA, hypertension, multiple sclerosis, chronic diastolic HF, hypothyroidism, anxiety, allergies, alcohol use disorder, orthostatic hypotension and HLD who presented to the ED after a fall. Patient reports he has a history of falls due to his MS but over the last 1 to 2 months, he has had more falls. He normally ambulates with a walker. About a week and a half ago, he noticed some shaking in his left leg which moved to his right leg he suddenly fell on his face sustaining a forehead hematoma and facial bruising. Per brother, patient was advised to call EMS but he did not.  Since then, he has had some difficulty breathing.  He has also noticed swelling in his lower extremities. Today, while he was walking, he started having another episode of leg shaking and eventually fell again but this time on his back, hitting his head on the floor. He denies any headaches, vision changes, dizziness, chest pain, nausea, vomiting but continues to endorse leg weakness, dizzy on exertion and shortness of breath.  ED Course: Initial vitals show temp 98.1, RR 18, HR 109, BP 86/65, improved to 125/97, SpO2 100% on room air, desatted to 88% and placed on 2 L [place].  Labs significant for normal white count, anemia with Hgb 11.0, BNP 506, normal kidney function and LFTs, normal TSH, mag and Phos. CXR showed cardiomegaly with mild pulmonary vascular congestion. CT head shows frontal scalp hematoma but no acute intracranial abnormalities or skull fracture.  Patient received IM Toradol  30 mg x 1 and nebulized treatment. TRH was consulted for admission.  Review of Systems: Please see HPI for pertinent positives and negatives. A complete 10 system review of systems are otherwise negative.  Past Medical History:   Diagnosis Date   Blackout 2018   several episodes with unknown etiology. cause of his ankle break. awaiting neuro workup   CVA (cerebral vascular accident) (HCC)    Hypertension    Hypothyroidism    Multiple sclerosis (HCC) 1990   Myocardial infarct (HCC)    Optic neuritis    x 3. takes steriods and it resolves.   Seizures (HCC)    as a child. diagnosed with MS and once treated, seizures resolved.   Sleep apnea    Past Surgical History:  Procedure Laterality Date   CORONARY ARTERY BYPASS GRAFT  2015   CORONARY STENT INTERVENTION N/A 06/07/2017   Procedure: CORONARY STENT INTERVENTION;  Surgeon: Antonette Batters, MD;  Location: [facility] INVASIVE CV LAB;  Service: Cardiovascular;  Laterality: N/A;   JOINT REPLACEMENT Left 2007   left hip replacement   LEFT HEART CATH AND CORS/GRAFTS ANGIOGRAPHY N/A 06/07/2017   Procedure: LEFT HEART CATH AND CORS/GRAFTS ANGIOGRAPHY;  Surgeon: Michelle Aid, MD;  Location: [facility] INVASIVE CV LAB;  Service: Cardiovascular;  Laterality: N/A;   ORIF ANKLE FRACTURE Right 09/22/2016   Procedure: OPEN REDUCTION INTERNAL FIXATION (ORIF) ANKLE FRACTURE;  Surgeon: Elner Hahn, MD;  Location: [facility] ORS;  Service: Orthopedics;  Laterality: Right;   quadruple cardiac bypass  2015   Social History:  reports that he has never smoked. He has quit using smokeless tobacco.  His smokeless tobacco use included chew. He reports current alcohol use. He reports that he does not use drugs.  Allergies  Allergen Reactions   Hydrochlorothiazide-Triamterene Other (  See Comments) and Palpitations    High blood pressure   Metoprolol  Other (See Comments) and Palpitations    Exacerbates MS, High blood pressure   Penicillins Other (See Comments)    Sores in mouth  Has patient had a PCN reaction causing immediate rash, facial/tongue/throat swelling, SOB or lightheadedness with hypotension: No  Has patient had a PCN reaction causing severe rash involving mucus membranes or skin  necrosis: No  Has patient had a PCN reaction that required hospitalization: No  Has patient had a PCN reaction occurring within the last 10 years: No  If all of the above answers are "NO", then may proceed with Cephalosporin use.  MOUTH SORES   Aripiprazole Other (See Comments)    WORSENED SLEEP WALKING    Family History  Problem Relation Age of Onset   Cancer Mother    Other Father    CAD Brother      Prior to Admission medications   Medication Sig Start Date End Date Taking? Authorizing Provider  Alirocumab 75 MG/ML SOAJ Inject 75 mg into the skin every 14 (fourteen) days. 03/16/23  Yes [provider]  aspirin  EC 81 MG tablet Take 81 mg by mouth daily.   Yes [provider]  atorvastatin  (LIPITOR) 80 MG tablet Take 80 mg by mouth at bedtime.   Yes [provider]  buPROPion  (WELLBUTRIN  XL) 300 MG 24 hr tablet Take 300 mg by mouth daily.   Yes [provider]  clopidogrel  (PLAVIX ) 75 MG tablet Take 75 mg by mouth daily.   Yes [provider]  docusate sodium  (COLACE) 50 MG capsule Take 50 mg by mouth 2 (two) times daily.   Yes [provider]  escitalopram  (LEXAPRO ) 20 MG tablet Take 20 mg by mouth daily.   Yes [provider]  ezetimibe (ZETIA) 10 MG tablet Take 10 mg by mouth daily. 06/10/23  Yes [provider]  folic acid  (FOLVITE ) 400 MCG tablet Take 400 mcg by mouth daily.   Yes [provider]  hydrOXYzine (ATARAX) 10 MG tablet Take 20 mg by mouth once as needed for anxiety. 02/19/23  Yes [provider]  lamoTRIgine  (LAMICTAL ) 25 MG tablet Take 25 mg by mouth daily.   Yes [provider]  montelukast  (SINGULAIR ) 10 MG tablet Take 10 mg by mouth daily.    Yes [provider]  OXcarbazepine  (TRILEPTAL ) 150 MG tablet Take 1 tablet by mouth 2 (two) times daily. 02/23/20  Yes [provider]  pregabalin (LYRICA) 150 MG capsule Take 150 mg by mouth 3 (three) times  daily. 09/20/23  Yes [provider]  QUEtiapine  (SEROQUEL ) 100 MG tablet Take 100 mg by mouth at bedtime.   Yes [provider]  senna-docusate (SENOKOT-S) 8.6-50 MG tablet Take 2 tablets by mouth 2 (two) times daily as needed. 06/10/23  Yes [provider]  tiZANidine  (ZANAFLEX ) 4 MG tablet Take 4 mg by mouth every 8 (eight) hours as needed for muscle spasms. 02/25/21  Yes [provider]  levothyroxine  (SYNTHROID , LEVOTHROID) 25 MCG tablet Take 25 mcg by mouth daily before breakfast.  Patient not taking: Reported on 10/04/2023    [provider]    Physical Exam: BP 106/79   Pulse (!) 102   Temp 97.8 F (36.6 C)   Resp 17   Ht 5\' 11"  (1.803 m)   Wt 100.4 kg   SpO2 100%   BMI 30.87 kg/m  General: Pleasant, well-appearing elderly man laying in bed. No  acute distress. HEENT: Small forehead hematoma, nontender. Anicteric sclera. EOMI. PERRLA. Neck: Supple. No JVD CV: Tachycardic. Regular rhythm. No murmurs, rubs, or gallops. Trace BLE edema Pulmonary: On 2 L [place]. Lungs CTAB. Normal effort. Bibasilar rales. Abdominal: Soft, nontender, nondistended. Normal bowel sounds. Extremities: Palpable radial and DP pulses. Normal ROM. Skin: Warm and dry. Facial ecchymosis that has improved per patient and brother Neuro: A&Ox3. Moves all extremities. Normal sensation to light touch. No focal deficit. Psych: Normal mood and affect          Labs on Admission:  Basic Metabolic Panel: Recent Labs  Lab 10/04/23 1154 10/04/23 1536  NA 143  --   K 4.2  --   CL 112*  --   CO2 24  --   GLUCOSE 81  --   BUN 13  --   CREATININE 1.20  --   CALCIUM  8.7*  --   MG  --  2.3  PHOS  --  4.3   Liver Function Tests: Recent Labs  Lab 10/04/23 1154  AST 30  ALT 17  ALKPHOS 76  BILITOT 1.3*  PROT 6.3*  ALBUMIN 3.4*   No results for input(s): "LIPASE", "AMYLASE" in the last 168 hours. No results for input(s): "AMMONIA" in the last 168  hours. CBC: Recent Labs  Lab 10/04/23 1105  WBC 6.9  NEUTROABS 4.4  HGB 11.0*  HCT 34.7*  MCV 93.3  PLT 273   Cardiac Enzymes: No results for input(s): "CKTOTAL", "CKMB", "CKMBINDEX", "TROPONINI" in the last 168 hours. BNP (last 3 results) Recent Labs    10/04/23 1105  BNP 506.4*    ProBNP (last 3 results) No results for input(s): "PROBNP" in the last 8760 hours.  CBG: No results for input(s): "GLUCAP" in the last 168 hours.  Radiological Exams on Admission: CT HEAD WO CONTRAST ( ) Result Date: 10/04/2023 CLINICAL DATA:  Head trauma, coagulopathy (Age 69-64y) Head trauma, skull fracture or hematoma (Age 79-64y) EXAM: CT HEAD WITHOUT CONTRAST TECHNIQUE: Contiguous axial images were obtained from the base of the skull through the vertex without intravenous contrast. RADIATION DOSE REDUCTION: This exam was performed according to the departmental dose-optimization program which includes automated exposure control, adjustment of the mA and/or kV according to patient size and/or use of iterative reconstruction technique. COMPARISON:  Head CT 10/16/2022 FINDINGS: Brain: No acute intracranial hemorrhage. No subdural or extra-axial collection. Unchanged atrophy, advanced for age. Stable periventricular and deep white matter hypodensities. Unchanged lacunar infarct in the left basal ganglia. Vascular: Atherosclerosis of skullbase vasculature without hyperdense vessel or abnormal calcification. Skull: Left-sided craniotomy.  No skull fracture. Sinuses/Orbits: No acute finding. Other: Frontal scalp hematoma to the left of midline. IMPRESSION: 1. Frontal scalp hematoma to the left of midline. No acute intracranial abnormality. No skull fracture. 2. Unchanged atrophy, remote left basal gangliar lacunar infarct, and periventricular white matter change. Electronically Signed   By: Chadwick Colonel M.D.   On: 10/04/2023 16:50   DG Chest Port 1 View Result Date: 10/04/2023 CLINICAL DATA:  Shortness  of breath EXAM: PORTABLE CHEST 1 VIEW COMPARISON:  Chest radiograph 10/12/2022 FINDINGS: Cardiomegaly. Status post median sternotomy. Pulmonary vascular redistribution and mild interstitial opacities. No pleural effusion or pneumothorax. IMPRESSION: Cardiomegaly with mild edema. Electronically Signed   By: Jone Neither M.D.   On: 10/04/2023 14:38   DG Thoracic Spine 2 View Result Date: 10/04/2023 CLINICAL DATA:  Status post fall EXAM: THORACIC SPINE 2 VIEWS COMPARISON:  None Available. FINDINGS: Multilevel degenerative osteoarthritic changes of the  midthoracic vertebral bodies and intervertebral disc spaces IMPRESSION: *No acute fracture or subluxation. 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 Electronically Signed   By: Fredrich Jefferson M.D.   On: 10/04/2023 14:02   Assessment/Plan Cody Burns is a 58 y.o. male with medical history significant for CVA, hypertension, multiple sclerosis, chronic diastolic HF, hypothyroidism,, anxiety, allergies, alcohol use disorder, orthostatic hypotension and HLD who presented to the ED after a fall and admitted for acute on chronic diastolic HF.  # Acute on chronic diastolic HF # Acute hypoxic respiratory failure - Last TTE 10/13/2022 showed EF 50-55%, moderate LVH, G1DD, mild to moderate MVR - Has had progressive shortness of breath and dizziness on exertion over the last 2 weeks - CXR shows pulmonary edema, BNP elevated to 500 - IV Lasix 40 mg daily - Check echocardiogram - Continue supplemental O2, wean as able - Strict I&O's, daily weight - Telemetry  # Fall - Presented after 2 falls in the last 2 weeks, sustained forehead hematoma from previous fall - CT head shows frontal scalp hematoma but no acute intracranial abnormality. - Apply ice pack to hematoma - PT/OT eval and treat - Follow-up vitamin D  levels - Fall precautions  # Multiple sclerosis - Has had recurrent falls due to  this. - Continue Lamictal , Trileptal , Lyrica, Seroquel  and as needed tizanidine  - PT evaluation as above - Follow-up vitamin D  and vitamin B12 levels  # Alcohol use disorder - Reports drinking 1-2 glasses of jim beam liquor a day - Continue folic acid  - Check CIWA  # HTN # Orthostatic hypotension - BP stable with SBP in the 90s to 120s - CTM  # HLD # Hx of CVA # CAD s/p stenting - Continue aspirin , Plavix , atorvastatin  and Zetia  # Anxiety - Continue bupropion  and as needed hydroxyzine  DVT prophylaxis: Lovenox      Code Status: Full Code  Consults called: None  Family Communication: Discussed admission with brother at bedside  Severity of Illness: The appropriate patient status for this patient is INPATIENT. Inpatient status is judged to be reasonable and necessary in order to provide the required intensity of service to ensure the patient's safety. The patient's presenting symptoms, physical exam findings, and initial radiographic and laboratory data in the context of their chronic comorbidities is felt to place them at high risk for further clinical deterioration. Furthermore, it is not anticipated that the patient will be medically stable for discharge from the hospital within 2 midnights of admission.   * I certify that at the point of admission it is my clinical judgment that the patient will require inpatient hospital care spanning beyond 2 midnights from the point of admission due to high intensity of service, high risk for further deterioration and high frequency of surveillance required.*  Level of care: Telemetry Medical   This record has been created using Conservation officer, historic buildings. Errors have been sought and corrected, but may not always be located. Such creation errors do not reflect on the standard of care.   Vita Grip, MD 10/04/2023, 7:14 PM [facility] Pager: [phone] Isaiah 41:10   If 7PM-7AM, please contact  night-coverage [email or web address] Password [email or web address]

## 2023-10-05 ENCOUNTER — Other Ambulatory Visit (HOSPITAL_COMMUNITY): Payer: Self-pay

## 2023-10-05 ENCOUNTER — Inpatient Hospital Stay: Admit: 2023-10-05 | Discharge: 2023-10-05 | Disposition: A | Discharge status: Home / Self Care

## 2023-10-05 DIAGNOSIS — I5033 Acute on chronic diastolic (congestive) heart failure: Secondary | ICD-10-CM | POA: Diagnosis not present

## 2023-10-05 LAB — IRON AND TIBC
Iron: 41 ug/dL — ABNORMAL LOW (ref 45–182)
Saturation Ratios: 11 % — ABNORMAL LOW (ref 17.9–39.5)
TIBC: 392 ug/dL (ref 250–450)
UIBC: 351 ug/dL

## 2023-10-05 MED ORDER — VITAMIN B-12 1000 MCG PO TABS: 1000.0000 ug | ORAL_TABLET | Freq: Every day | ORAL | Status: DC

## 2023-10-05 MED ORDER — CYANOCOBALAMIN 1000 MCG/ML IJ SOLN
1000.0000 ug | Freq: Every day | INTRAMUSCULAR | Status: DC
Start: 2023-10-05 — End: 2023-10-12
  Administered 2023-10-05 – 2023-10-08 (×4): 1000 ug via INTRAMUSCULAR
  Filled 2023-10-05 (×4): qty 1

## 2023-10-05 MED ORDER — FUROSEMIDE 10 MG/ML IJ SOLN
40.0000 mg | Freq: Two times a day (BID) | INTRAMUSCULAR | Status: DC
  Administered 2023-10-05 – 2023-10-06 (×3): 40 mg via INTRAVENOUS
  Filled 2023-10-05 (×3): qty 4

## 2023-10-05 NOTE — Evaluation (Signed)
 Physical Therapy Evaluation Patient Details Name: Cody Burns MRN: [id number] DOB: 1965-07-10 Today's Date: 10/05/2023  History of Present Illness  Cody Burns is a 58 y.o. male with medical history significant for CVA, hypertension, multiple sclerosis, chronic diastolic HF, hypothyroidism, anxiety, allergies, alcohol use disorder, orthostatic hypotension and HLD who presented to the ED after a fall. Reports history of falls, but increased falls over past 1-2 months.    Clinical Impression  Orders Received. Chart Reviewed. Patient admitted for above diagnosis. Prior to admission, patient was living home alone with check in/PRN assist from brother that lives nearby. Lives in single level home with ramped entrance. Was using RW for household distance ambulation. Reports has a manual w/c and power scooter as well but does not utilize. On evaluation, patient was MOD I with bed mobility. Patient require CGA to MIN A to stand from EOB/BSC with use of RW. Patient able to stand step transfer with RW with CGA to MIN A. Increased shakiness/wobbliness noted in LE's with standing. Patient able to ambulate x 54ft from [facility] to Recliner with MIN A +2 (for safety). Patient left in recliner with all needs in reach, family member remains at bedside. Patient will benefit from skilled acute PT services to address functional impairments (see below for additional) and maximize functional mobility. Anticipate the need for follow up PT services upon acute hospital discharge. Will continue to follow acutely.        If plan is discharge home, recommend the following: A lot of help with walking and/or transfers;Assist for transportation;Help with stairs or ramp for entrance   Can travel by private vehicle   Yes    Equipment Recommendations Other (comment) (TBD at next level of care)  Recommendations for Other Services       Functional Status Assessment Patient has had a recent decline in their functional status and  demonstrates the ability to make significant improvements in function in a reasonable and predictable amount of time.     Precautions / Restrictions Precautions Precautions: Fall Restrictions Weight Bearing Restrictions Per Provider Order: No      Mobility  Bed Mobility Overal bed mobility: Needs Assistance Bed Mobility: Supine to Sit     Supine to sit: Supervision     General bed mobility comments: Pt able to complete supine > sit with supervision, inc time to reach EOB.    Transfers Overall transfer level: Needs assistance Equipment used: Rolling walker (2 wheels) Transfers: Sit to/from Stand, Bed to chair/wheelchair/BSC Sit to Stand: Contact guard assist, Min assist   Step pivot transfers: Contact guard assist, Min assist       General transfer comment: Pt able to stand from EOB, BSC with CGA to Min A.    Ambulation/Gait Ambulation/Gait assistance: Contact guard assist, +2 physical assistance Gait Distance (Feet): 6 Feet Assistive device: Rolling walker (2 wheels)   Gait velocity: Decreased     General Gait Details: Pt able to ambulate x 6 ft from [facility] to recliner with use of RW, CGA +2 for safety d/t history of falls. increased shakiness noted in legs with standing.  Stairs            Wheelchair Mobility     Tilt Bed    Modified Rankin (Stroke Patients Only)       Balance Overall balance assessment: Needs assistance Sitting-balance support: Feet supported, No upper extremity supported Sitting balance-Leahy Scale: Good     Standing balance support: Bilateral upper extremity supported, During functional activity, Reliant on assistive  device for balance Standing balance-Leahy Scale: Fair Standing balance comment: increased reliance on RW                             Pertinent Vitals/Pain Pain Assessment Pain Assessment: No/denies pain    Home Living Family/patient expects to be discharged to:: Private residence Living  Arrangements: Alone Available Help at Discharge: Family;Available PRN/intermittently (Brother lives nearby) Type of Home: House Home Access: Ramped entrance       Home Layout: One level Home Equipment: Agricultural consultant (2 wheels);Shower seat;Wheelchair - IT trainer      Prior Function Prior Level of Function : Independent/Modified Independent             Mobility Comments: Reports ambulating around house with RW, frequent falls reported by patient/brother.       Extremity/Trunk Assessment   Upper Extremity Assessment Upper Extremity Assessment: Defer to OT evaluation    Lower Extremity Assessment Lower Extremity Assessment: Generalized weakness    Cervical / Trunk Assessment Cervical / Trunk Assessment: Normal  Communication   Communication Communication: No apparent difficulties    Cognition Arousal: Alert Behavior During Therapy: WFL for tasks assessed/performed   PT - Cognitive impairments: No apparent impairments                         Following commands: Intact       Cueing Cueing Techniques: Verbal cues     General Comments      Exercises Other Exercises Other Exercises: Orthostatic BP assessed, BP stable.   Assessment/Plan    PT Assessment Patient needs continued PT services  PT Problem List Decreased strength;Decreased activity tolerance;Decreased balance;Decreased mobility;Decreased coordination       PT Treatment Interventions DME instruction;Gait training;Functional mobility training;Therapeutic activities;Therapeutic exercise;Balance training;Neuromuscular re-education    PT Goals (Current goals can be found in the Care Plan section)  Acute Rehab PT Goals Patient Stated Goal: Get stronger; try to stop falling PT Goal Formulation: With patient Time For Goal Achievement: 10/19/23 Potential to Achieve Goals: Good    Frequency Min 2X/week     Co-evaluation PT/OT/SLP Co-Evaluation/Treatment: Yes Reason for  Co-Treatment: For patient/therapist safety;To address functional/ADL transfers PT goals addressed during session: Mobility/safety with mobility OT goals addressed during session: ADL's and self-care       AM-PAC PT "6 Clicks" Mobility  Outcome Measure Help needed turning from your back to your side while in a flat bed without using bedrails?: None Help needed moving from lying on your back to sitting on the side of a flat bed without using bedrails?: None Help needed moving to and from a bed to a chair (including a wheelchair)?: A Little Help needed standing up from a chair using your arms (e.g., wheelchair or bedside chair)?: A Little Help needed to walk in hospital room?: A Lot Help needed climbing 3-5 steps with a railing? : A Lot 6 Click Score: 18    End of Session Equipment Utilized During Treatment: Gait belt Activity Tolerance: Patient tolerated treatment well Patient left: in chair;with call bell/phone within reach;with nursing/sitter in room Nurse Communication: Mobility status PT Visit Diagnosis: Other abnormalities of gait and mobility (R26.89);Unsteadiness on feet (R26.81);Muscle weakness (generalized) (M62.81);Repeated falls (R29.6);Difficulty in walking, not elsewhere classified (R26.2)    Time: 1040-1104 PT Time Calculation (min) (ACUTE ONLY): 24 min   Charges:   PT Evaluation $PT Eval Low Complexity: 1 Low   PT General Charges $$  ACUTE PT VISIT: 1 Visit         Wilfrid Hyser M Fairly, PT, DPT 10/05/23 1:10 PM

## 2023-10-05 NOTE — Evaluation (Signed)
 Occupational Therapy Evaluation Patient Details Name: Cody Burns MRN: [id number] DOB: 1966-02-18 Today's Date: 10/05/2023   History of Present Illness   Cody Burns is a 58 y.o. male with medical history significant for CVA, hypertension, multiple sclerosis, chronic diastolic HF, hypothyroidism, anxiety, allergies, alcohol use disorder, orthostatic hypotension and HLD who presented to the ED after a fall. Reports history of falls, but increased falls over past 1-2 months.     Clinical Impressions Cody Burns was seen for OT evaluation this date. Prior to hospital admission, pt was MOD I using rollator, reports extensive falls hx. Pt lives alone in home with ramped entrance, brother checks on him daily. Pt currently requires MIN A + RW sit<>stand and bed>BSC t/f and BSC>chair ~5 ft, poor standing tolerance noted with B knee tremors. MAX A don B socks in sitting. Pt would benefit from skilled OT to address noted impairments and functional limitations (see below for any additional details). Upon hospital discharge, recommend OT follow up <3 hours/day, pt remains a high falls risk.     If plan is discharge home, recommend the following:   A lot of help with walking and/or transfers;A lot of help with bathing/dressing/bathroom     Functional Status Assessment   Patient has had a recent decline in their functional status and demonstrates the ability to make significant improvements in function in a reasonable and predictable amount of time.     Equipment Recommendations   Wheelchair (measurements OT)     Recommendations for Other Services         Precautions/Restrictions   Precautions Precautions: Fall Restrictions Weight Bearing Restrictions Per Provider Order: No     Mobility Bed Mobility Overal bed mobility: Needs Assistance Bed Mobility: Supine to Sit     Supine to sit: Supervision          Transfers Overall transfer level: Needs assistance Equipment  used: Rolling walker (2 wheels) Transfers: Sit to/from Stand, Bed to chair/wheelchair/BSC Sit to Stand: Min assist     Step pivot transfers: Min assist            Balance Overall balance assessment: Needs assistance Sitting-balance support: Feet supported, No upper extremity supported Sitting balance-Leahy Scale: Good     Standing balance support: Bilateral upper extremity supported, During functional activity, Reliant on assistive device for balance Standing balance-Leahy Scale: Fair Standing balance comment: increased reliance on RW                           ADL either performed or assessed with clinical judgement   ADL Overall ADL's : Needs assistance/impaired                                       General ADL Comments: MIN A + RW for BSC t/f. MAX A don B socks in sitting     Vision         Perception         Praxis         Pertinent Vitals/Pain Pain Assessment Pain Assessment: No/denies pain     Extremity/Trunk Assessment Upper Extremity Assessment Upper Extremity Assessment: Generalized weakness   Lower Extremity Assessment Lower Extremity Assessment: Generalized weakness   Cervical / Trunk Assessment Cervical / Trunk Assessment: Normal   Communication Communication Communication: No apparent difficulties   Cognition Arousal: Alert Behavior During Therapy: WFL for tasks  assessed/performed Cognition: No apparent impairments                               Following commands: Intact       Cueing  General Comments   Cueing Techniques: Verbal cues      Exercises Exercises: Other exercises   Shoulder Instructions      Home Living Family/patient expects to be discharged to:: Private residence Living Arrangements: Alone Available Help at Discharge: Family;Available PRN/intermittently (Brother lives nearby) Type of Home: House Home Access: Ramped entrance     Home Layout: One level                Home Equipment: Agricultural consultant (2 wheels);Shower seat;Wheelchair - IT trainer          Prior Functioning/Environment Prior Level of Function : Independent/Modified Independent             Mobility Comments: Reports ambulating around house with RW, frequent falls reported by patient/brother.      OT Problem List: Decreased strength;Decreased range of motion;Decreased activity tolerance;Impaired balance (sitting and/or standing)   OT Treatment/Interventions: Self-care/ADL training;Therapeutic exercise;Energy conservation;DME and/or AE instruction;Therapeutic activities      OT Goals(Current goals can be found in the care plan section)   Acute Rehab OT Goals Patient Stated Goal: to go home OT Goal Formulation: With patient/family Time For Goal Achievement: 10/19/23 Potential to Achieve Goals: Fair ADL Goals Pt Will Perform Grooming: with modified independence;standing Pt Will Perform Lower Body Dressing: with modified independence;sit to/from stand Pt Will Transfer to Toilet: with modified independence;ambulating;regular height toilet   OT Frequency:  Min 2X/week    Co-evaluation PT/OT/SLP Co-Evaluation/Treatment: Yes Reason for Co-Treatment: For patient/therapist safety;To address functional/ADL transfers PT goals addressed during session: Mobility/safety with mobility OT goals addressed during session: ADL's and self-care      AM-PAC OT "6 Clicks" Daily Activity     Outcome Measure Help from another person eating meals?: None Help from another person taking care of personal grooming?: A Little Help from another person toileting, which includes using toliet, bedpan, or urinal?: A Lot Help from another person bathing (including washing, rinsing, drying)?: A Lot Help from another person to put on and taking off regular upper body clothing?: A Little Help from another person to put on and taking off regular lower body clothing?: A Lot 6 Click  Score: 16   End of Session Equipment Utilized During Treatment: Rolling walker (2 wheels);Gait belt Nurse Communication: Mobility status  Activity Tolerance: Patient tolerated treatment well Patient left: in chair;with call bell/phone within reach;with chair alarm set;with family/visitor present  OT Visit Diagnosis: Other abnormalities of gait and mobility (R26.89);Muscle weakness (generalized) (M62.81)                Time: 1040-1104 OT Time Calculation (min): 24 min Charges:  OT General Charges $OT Visit: 1 Visit OT Evaluation $OT Eval Moderate Complexity: 1 Mod OT Treatments $Self Care/Home Management : 8-22 mins  Gordan Latina, M.S. OTR/L  10/05/23, 1:33 PM  ascom [phone]

## 2023-10-05 NOTE — Progress Notes (Signed)
 Heart Failure Navigator Progress Note  Assessed for Heart & Vascular TOC clinic readiness.  Patient does not meet criteria due to current [facility] patient of Dr. Beau Bound..   Navigator will sign off at the time.  Celedonio Coil, RN, BSN [facility] Heart Failure Navigator Secure Chat Only

## 2023-10-05 NOTE — Progress Notes (Signed)
 [facility] Progress Note  Patient: Cody Burns    ZOX:[id number]  DOA: 10/04/2023     Date of Service: the patient was seen and examined on 10/05/2023  Chief Complaint  Patient presents with   Fall        Brief hospital course: Cody Burns is a 58 y.o. male with medical history significant for CVA, hypertension, multiple sclerosis, chronic diastolic HF, hypothyroidism, anxiety, allergies, alcohol use disorder, orthostatic hypotension and HLD who presented to the ED after a fall. Patient reports he has a history of falls due to his MS but over the last 1 to 2 months, he has had more falls. He normally ambulates with a walker. About a week and a half ago, he noticed some shaking in his left leg which moved to his right leg he suddenly fell on his face sustaining a forehead hematoma and facial bruising. Per brother, patient was advised to call EMS but he did not.  Since then, he has had some difficulty breathing.  He has also noticed swelling in his lower extremities. Today, while he was walking, he started having another episode of leg shaking and eventually fell again but this time on his back, hitting his head on the floor. He denies any headaches, vision changes, dizziness, chest pain, nausea, vomiting but continues to endorse leg weakness, dizzy on exertion and shortness of breath.   ED Course: Initial vitals show temp 98.1, RR 18, HR 109, BP 86/65, improved to 125/97, SpO2 100% on room air, desatted to 88% and placed on 2 L [place].  Labs significant for normal white count, anemia with Hgb 11.0, BNP 506, normal kidney function and LFTs, normal TSH, mag and Phos. CXR showed cardiomegaly with mild pulmonary vascular congestion. CT head shows frontal scalp hematoma but no acute intracranial abnormalities or skull fracture.  Patient received IM Toradol  30 mg x 1 and nebulized treatment. TRH was consulted for admission.   Assessment and Plan:  # Acute on chronic diastolic HF # Acute hypoxic  respiratory failure Last TTE 10/13/2022 showed EF 50-55%, moderate LVH, G1DD, mild to moderate MVR Has had progressive shortness of breath and dizziness on exertion over the last 2 weeks. Resolved after IV lasix - CXR shows pulmonary edema, BNP elevated to 500 - IV Lasix 40 mg BID - Check echocardiogram, still pending - Continue supplemental O2, wean as able - Strict I&O's, daily weight - Telemetry Cardiology consult appreciated  # Fall - Presented after 2 falls in the last 2 weeks, sustained forehead hematoma from previous fall - CT head shows frontal scalp hematoma but no acute intracranial abnormality. - Apply ice pack to hematoma - PT/OT eval and treat - Follow-up vitamin D  levels - Fall precautions   # Multiple sclerosis - Has had recurrent falls due to this. - Continue Lamictal , Trileptal , Lyrica, Seroquel  and as needed tizanidine  - PT evaluation as above - Follow-up vitamin D  and vitamin B12 levels   # Alcohol use disorder - Reports drinking 1-2 glasses of jim beam liquor a day - Continue folic acid  - Check CIWA   # HTN # Orthostatic hypotension - BP stable with SBP in the 90s to 120s - CTM 5/13 Orthostatic vitals within normal range  # HLD # Hx of CVA # CAD s/p stenting - Continue aspirin , Plavix , atorvastatin  and Zetia   # Anxiety - Continue bupropion  and as needed hydroxyzine  # Vitamin B12 deficiency: Vitamin B12 level 138, goal >400. started vitamin B12 1000 mcg IM injection daily  during hospital stay, followed by oral supplement.  Follow-up PCP to repeat vitamin B12 level after 3 to 6 months.    Body mass index is 33.52 kg/m.  Interventions:  Diet: Heart healthy diet, fluid restriction 1.5 L/day DVT Prophylaxis: Subcutaneous Lovenox    Advance goals of care discussion: Full code  Family Communication: family was present at bedside, at the time of interview.  The pt provided permission to discuss medical plan with the family. Opportunity was given to  ask question and all questions were answered satisfactorily.   Disposition:  Pt is from Home, admitted with CHF and frequent falls, still on IV Lasix and risk of fall, which precludes a safe discharge. Discharge to SNF, when stable, most likely in 1 to 2 days.  Subjective: No significant events overnight, shortness of breath resolved.  Lower extremity edema is improving.  Denies any chest pain palpitation, no any other complaints.  Physical Exam: General: NAD, lying comfortably Appear in no distress, affect appropriate Eyes: PERRLA ENT: Oral Mucosa Clear, moist  Neck: no JVD,  Cardiovascular: S1 and S2 Present, no Murmur,  Respiratory: good respiratory effort, Bilateral Air entry equal and Decreased, no Crackles, no wheezes Abdomen: Bowel Sound present, Soft and no tenderness,  Skin: no rashes Extremities: 2+ pedal edema, no calf tenderness Neurologic: without any new focal findings Gait not checked due to patient safety concerns  Vitals:   10/05/23 0346 10/05/23 0500 10/05/23 0810 10/05/23 1452  BP: 115/87  (!) 119/90 (!) 126/96  Pulse: (!) 112  (!) 113 (!) 117  Resp: 18  17 15   Temp: 98 F (36.7 C)  98 F (36.7 C) 98.6 F (37 C)  TempSrc:   Oral   SpO2: 100%  99% 96%  Weight:  109 kg    Height:        Intake/Output Summary (Last 24 hours) at 10/05/2023 1526 Last data filed at 10/05/2023 1034 Gross per 24 hour  Intake 480 ml  Output 1800 ml  Net -1320 ml   Filed Weights   10/04/23 1100 10/05/23 0500  Weight: 100.4 kg 109 kg    Data Reviewed: I have personally reviewed and interpreted daily labs, tele strips, imagings as discussed above. I reviewed all nursing notes, pharmacy notes, vitals, pertinent old records I have discussed plan of care as described above with RN and patient/family.  CBC: Recent Labs  Lab 10/04/23 1105  WBC 6.9  NEUTROABS 4.4  HGB 11.0*  HCT 34.7*  MCV 93.3  PLT 273   Basic Metabolic Panel: Recent Labs  Lab 10/04/23 1154  10/04/23 1536  NA 143  --   K 4.2  --   CL 112*  --   CO2 24  --   GLUCOSE 81  --   BUN 13  --   CREATININE 1.20  --   CALCIUM  8.7*  --   MG  --  2.3  PHOS  --  4.3    Studies: CT HEAD WO CONTRAST ( ) Result Date: 10/04/2023 CLINICAL DATA:  Head trauma, coagulopathy (Age 35-64y) Head trauma, skull fracture or hematoma (Age 50-64y) EXAM: CT HEAD WITHOUT CONTRAST TECHNIQUE: Contiguous axial images were obtained from the base of the skull through the vertex without intravenous contrast. RADIATION DOSE REDUCTION: This exam was performed according to the departmental dose-optimization program which includes automated exposure control, adjustment of the mA and/or kV according to patient size and/or use of iterative reconstruction technique. COMPARISON:  Head CT 10/16/2022 FINDINGS: Brain: No acute intracranial hemorrhage. No  subdural or extra-axial collection. Unchanged atrophy, advanced for age. Stable periventricular and deep white matter hypodensities. Unchanged lacunar infarct in the left basal ganglia. Vascular: Atherosclerosis of skullbase vasculature without hyperdense vessel or abnormal calcification. Skull: Left-sided craniotomy.  No skull fracture. Sinuses/Orbits: No acute finding. Other: Frontal scalp hematoma to the left of midline. IMPRESSION: 1. Frontal scalp hematoma to the left of midline. No acute intracranial abnormality. No skull fracture. 2. Unchanged atrophy, remote left basal gangliar lacunar infarct, and periventricular white matter change. Electronically Signed   By: Chadwick Colonel M.D.   On: 10/04/2023 16:50    Scheduled Meds:  aspirin  EC  81 mg Oral Daily   atorvastatin   80 mg Oral QHS   buPROPion   300 mg Oral Daily   clopidogrel   75 mg Oral Daily   cyanocobalamin   1,000 mcg Intramuscular Q1200   Followed by   Cecily Cohen ON 10/12/2023] vitamin B-12  1,000 mcg Oral Daily   enoxaparin  (LOVENOX ) injection  50 mg Subcutaneous Q24H   escitalopram   20 mg Oral Daily    ezetimibe  10 mg Oral Daily   folic acid   500 mcg Oral Daily   furosemide  40 mg Intravenous BID   lamoTRIgine   25 mg Oral Daily   montelukast   10 mg Oral Daily   OXcarbazepine   150 mg Oral BID   pregabalin  150 mg Oral TID   QUEtiapine   100 mg Oral QHS   Continuous Infusions: PRN Meds: acetaminophen  **OR** acetaminophen , hydrOXYzine, ipratropium-albuterol, ondansetron  **OR** ondansetron  (ZOFRAN ) IV, senna-docusate, tiZANidine   Time spent: 55 minutes  Author: Althia Atlas. MD [facility] 10/05/2023 3:26 PM  To reach On-call, see care teams to locate the attending and reach out to them via [email or web address]. If 7PM-7AM, please contact night-coverage If you still have difficulty reaching the attending provider, please page the [facility] (Director on Call) for [facility] on amion for assistance.

## 2023-10-05 NOTE — TOC Progression Note (Signed)
 Transition of Care [facility]) - Progression Note    Patient Details  Name: Cody Burns MRN: [id number] Date of Birth: [birth year]  [facility] CM/SW Contact  Alexandra Ice, RN Phone Number: 10/05/2023, 2:35 PM  Clinical Narrative:     [place] [facility] and bedsearch completed. Pending bed offers.       Expected Discharge Plan and Services                                               Social Determinants of Health (SDOH) Interventions SDOH Screenings   Food Insecurity: No Food Insecurity (10/04/2023)  Housing: Low Risk  (10/04/2023)  Transportation Needs: No Transportation Needs (10/04/2023)  Utilities: Not At Risk (10/04/2023)  Social Connections: Socially Isolated (10/04/2023)  Tobacco Use: Medium Risk (10/04/2023)    Readmission Risk Interventions     No data to display

## 2023-10-05 NOTE — Progress Notes (Signed)
 Pt's legs noted to be red and swollen with R. leg bigger than left. MD. Guillermo Lees made aware. No orders at this time. Pt has no complaints. Will continue to monitor.

## 2023-10-05 NOTE — NC FL2 (Signed)
 [place]  MEDICAID FL2 LEVEL OF CARE FORM     IDENTIFICATION  Patient Name: Cody Burns Birthdate: 1966-01-06 Sex: male Admission Date (Current Location): 10/04/2023  [facility] and [place] Number:  Chiropodist and Address:  [facility], [address] [place], [place] [address]      Provider Number: [id number]  Attending Physician Name and Address:  Althia Atlas, MD  Relative Name and Phone Number:  Chisom Richesin (brother), [phone]    Current Level of Care: Hospital Recommended Level of Care: Skilled Nursing Facility Prior Approval Number:    Date Approved/Denied:   PASRR Number: [id number]  Discharge Plan: SNF    Current Diagnoses: Patient Active Problem List   Diagnosis Date Noted   Acute on chronic diastolic (congestive) heart failure (HCC) 10/04/2023   Fall 10/04/2023   Acute hypoxic respiratory failure (HCC) 10/04/2023   Demand ischemia (HCC) 10/16/2022   Alcohol dependence (HCC) 10/16/2022   Coronary artery disease 10/16/2022   Major depressive disorder 10/16/2022   Neurogenic bladder 10/16/2022   AKI (acute kidney injury) (HCC) 10/12/2022   Hypothyroidism 10/12/2022   Acute renal failure (ARF) (HCC) 06/05/2017   Left hip pain 04/05/2017   Acute kidney injury (HCC) 02/23/2017   Zoster 02/05/2017   Pre-syncope 02/03/2017   Stenosis of carotid artery 01/05/2017   Carotid stenosis, asymptomatic, right 11/16/2016   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   Hx of ischemic left MCA stroke 11/13/2016   Hypertension 11/13/2016   Multiple sclerosis, relapsing-remitting (HCC) 11/13/2016   Carotid occlusion, left 11/13/2016   Hyperlipidemia LDL goal <70 11/13/2016   Slurred speech 11/13/2016   Closed trimalleolar fracture of right ankle 09/25/2016   Closed right ankle fracture 09/22/2016    Orientation RESPIRATION BLADDER Height & Weight     Self, Time, Situation, Place   Normal Continent Weight: 109 kg Height:  5\' 11"  (180.3 cm)  BEHAVIORAL SYMPTOMS/MOOD NEUROLOGICAL BOWEL NUTRITION STATUS      Continent Diet (heart healthy, thin liquid)  AMBULATORY STATUS COMMUNICATION OF NEEDS Skin   Limited Assist Verbally Normal                       Personal Care Assistance Level of Assistance  (P) Bathing, Feeding, Dressing Bathing Assistance: Limited assistance Feeding assistance: Limited assistance Dressing Assistance: Limited assistance     Functional Limitations Info             SPECIAL CARE FACTORS FREQUENCY  PT (By licensed PT), OT (By licensed OT)     PT Frequency: 5 times per week OT Frequency: 5 times per week            Contractures      Additional Factors Info  Code Status, Allergies Code Status Info: Full code Allergies Info: Hydrochlorothiazide-triamterene, Metoprolol , Penicillins, Aripiprazole           Current Medications (10/05/2023):  This is the current hospital active medication list Current Facility-Administered Medications  Medication Dose Route Frequency Provider Last Rate Last Admin   acetaminophen  (TYLENOL ) tablet 650 mg  650 mg Oral Q6H PRN Amponsah, Prosper M, MD       Or   acetaminophen  (TYLENOL ) suppository 650 mg  650 mg Rectal Q6H PRN Amponsah, Prosper M, MD       aspirin  EC tablet 81 mg  81 mg Oral Daily Amponsah, Prosper M, MD   81 mg at 10/05/23 0834   atorvastatin  (LIPITOR) tablet 80 mg  80  mg Oral QHS Amponsah, Prosper M, MD   80 mg at 10/04/23 2216   buPROPion  (WELLBUTRIN  XL) 24 hr tablet 300 mg  300 mg Oral Daily Amponsah, Prosper M, MD   300 mg at 10/05/23 1610   clopidogrel  (PLAVIX ) tablet 75 mg  75 mg Oral Daily Amponsah, Prosper M, MD   75 mg at 10/05/23 9604   cyanocobalamin  (VITAMIN B12) injection 1,000 mcg  1,000 mcg Intramuscular Q1200 Althia Atlas, MD   1,000 mcg at 10/05/23 1227   Followed by   Cecily Cohen ON 10/12/2023] cyanocobalamin  (VITAMIN B12) tablet 1,000 mcg  1,000 mcg Oral Daily Althia Atlas, MD       enoxaparin  (LOVENOX ) injection 50 mg  50 mg Subcutaneous Q24H Amponsah, Prosper M, MD   50 mg at 10/04/23 2217   escitalopram  (LEXAPRO ) tablet 20 mg  20 mg Oral Daily Amponsah, Prosper M, MD   20 mg at 10/05/23 5409   ezetimibe (ZETIA) tablet 10 mg  10 mg Oral Daily Amponsah, Prosper M, MD   10 mg at 10/05/23 8119   folic acid  (FOLVITE ) tablet 0.5 mg  500 mcg Oral Daily Amponsah, Prosper M, MD   0.5 mg at 10/05/23 1478   furosemide (LASIX) injection 40 mg  40 mg Intravenous BID Hudson, Caralyn, PA-C       hydrOXYzine (ATARAX) tablet 20 mg  20 mg Oral Once PRN Amponsah, Prosper M, MD       ipratropium-albuterol (DUONEB) 0.5-2.5 (3) MG/3ML nebulizer solution 3 mL  3 mL Nebulization Q6H PRN Vita Grip, MD       lamoTRIgine  (LAMICTAL ) tablet 25 mg  25 mg Oral Daily Amponsah, Prosper M, MD   25 mg at 10/05/23 2956   montelukast  (SINGULAIR ) tablet 10 mg  10 mg Oral Daily Amponsah, Prosper M, MD   10 mg at 10/05/23 2130   ondansetron  (ZOFRAN ) tablet 4 mg  4 mg Oral Q6H PRN Amponsah, Prosper M, MD       Or   ondansetron  (ZOFRAN ) injection 4 mg  4 mg Intravenous Q6H PRN Amponsah, Prosper M, MD       OXcarbazepine  (TRILEPTAL ) tablet 150 mg  150 mg Oral BID Amponsah, Prosper M, MD   150 mg at 10/05/23 0836   pregabalin (LYRICA) capsule 150 mg  150 mg Oral TID Vita Grip, MD   150 mg at 10/05/23 8657   QUEtiapine  (SEROQUEL ) tablet 100 mg  100 mg Oral QHS Amponsah, Prosper M, MD   100 mg at 10/04/23 2216   senna-docusate (Senokot-S) tablet 1 tablet  1 tablet Oral QHS PRN Vita Grip, MD       tiZANidine  (ZANAFLEX ) tablet 4 mg  4 mg Oral Q8H PRN Amponsah, Prosper M, MD         Discharge Medications: Please see discharge summary for a list of discharge medications.  Relevant Imaging Results:  Relevant Lab Results:   Additional Information SSN [id number]  Alexandra Ice, RN

## 2023-10-05 NOTE — Consult Note (Signed)
 [facility] CLINIC CARDIOLOGY CONSULT NOTE       Patient ID: Cody Burns MRN: [id number] DOB/AGE: June 23, 1965 58 y.o.  Admit date: 10/04/2023 Referring Physician Dr, Althia Atlas [facility], [facility] Primary Cardiologist Dr. Beau Bound (saw 2019) Reason for Consultation AoCHF  HPI: Cody Burns is a 58 y.o. male  with a past medical history of chronic HFpEF, hypertension, hyperlipidemia, hx CVA, multiple sclerosis, hypothyroidism, orthostatic hypotension, alcohol use disorder who presented to the ED on 10/04/2023 after frequent falls and worsening shortness of breath and orthopnea. Cardiology was consulted for further evaluation.   Patient presented to the ED after having frequent falls due to leg weakness with hx of multiple sclerosis. Patient reports he noticed worsening SOB, orthopnea and lower extremity swelling for past 3 weeks. Work up in the ED notable for Na 143, K 4.2, Mg 2.3, Cr 1.20, Hgb 11, plts 273. TSH within normal limits. Thoracic spine XR with no acute fracture or subuxation. CXR with cardiomegaly and mild pulmonary edema. BNP elevated at 506. Trops minimally elevated at 35. EKG not done this admission. Patient received 2x IV lasix 40 mg with good UOP.   At the time of my evaluation this afternoon, patient was resting comfortably in hospital bed at an incline with brother at bedside. We discussed patients symptoms in further detail. Patient states he's been laying in a recliner at home past 3 weeks due to worsening SOB. Patient denies any chest pain or palpitations. Patient states this AM his SOB improved s/p IV lasix 40 mg.   Review of systems complete and found to be negative unless listed above    Past Medical History:  Diagnosis Date   Blackout 2018   several episodes with unknown etiology. cause of his ankle break. awaiting neuro workup   CVA (cerebral vascular accident) (HCC)    Hypertension    Hypothyroidism    Multiple sclerosis (HCC) 1990    Myocardial infarct (HCC)    Optic neuritis    x 3. takes steriods and it resolves.   Seizures (HCC)    as a child. diagnosed with MS and once treated, seizures resolved.   Sleep apnea     Past Surgical History:  Procedure Laterality Date   CORONARY ARTERY BYPASS GRAFT  2015   CORONARY STENT INTERVENTION N/A 06/07/2017   Procedure: CORONARY STENT INTERVENTION;  Surgeon: Antonette Batters, MD;  Location: [facility] INVASIVE CV LAB;  Service: Cardiovascular;  Laterality: N/A;   JOINT REPLACEMENT Left 2007   left hip replacement   LEFT HEART CATH AND CORS/GRAFTS ANGIOGRAPHY N/A 06/07/2017   Procedure: LEFT HEART CATH AND CORS/GRAFTS ANGIOGRAPHY;  Surgeon: Michelle Aid, MD;  Location: [facility] INVASIVE CV LAB;  Service: Cardiovascular;  Laterality: N/A;   ORIF ANKLE FRACTURE Right 09/22/2016   Procedure: OPEN REDUCTION INTERNAL FIXATION (ORIF) ANKLE FRACTURE;  Surgeon: Elner Hahn, MD;  Location: [facility] ORS;  Service: Orthopedics;  Laterality: Right;   quadruple cardiac bypass  2015    Medications Prior to Admission  Medication Sig Dispense Refill Last Dose/Taking   Alirocumab 75 MG/ML SOAJ Inject 75 mg into the skin every 14 (fourteen) days.   Past Month   aspirin  EC 81 MG tablet Take 81 mg by mouth daily.   10/04/2023   atorvastatin  (LIPITOR) 80 MG tablet Take 80 mg by mouth at bedtime.   10/03/2023   buPROPion  (WELLBUTRIN  XL) 300 MG 24 hr tablet Take 300 mg by mouth daily.   10/04/2023   clopidogrel  (PLAVIX ) 75  MG tablet Take 75 mg by mouth daily.   10/04/2023 Morning   docusate sodium  (COLACE) 50 MG capsule Take 50 mg by mouth 2 (two) times daily.   Taking   escitalopram  (LEXAPRO ) 20 MG tablet Take 20 mg by mouth daily.   10/04/2023   ezetimibe (ZETIA) 10 MG tablet Take 10 mg by mouth daily.   10/04/2023   folic acid  (FOLVITE ) 400 MCG tablet Take 400 mcg by mouth daily.   10/04/2023   hydrOXYzine (ATARAX) 10 MG tablet Take 20 mg by mouth once as needed for anxiety.   10/04/2023   lamoTRIgine  (LAMICTAL )  25 MG tablet Take 25 mg by mouth daily.   10/04/2023   montelukast  (SINGULAIR ) 10 MG tablet Take 10 mg by mouth daily.    10/04/2023   OXcarbazepine  (TRILEPTAL ) 150 MG tablet Take 1 tablet by mouth 2 (two) times daily.   10/04/2023   pregabalin (LYRICA) 150 MG capsule Take 150 mg by mouth 3 (three) times daily.   10/04/2023   QUEtiapine  (SEROQUEL ) 100 MG tablet Take 100 mg by mouth at bedtime.   10/03/2023   senna-docusate (SENOKOT-S) 8.6-50 MG tablet Take 2 tablets by mouth 2 (two) times daily as needed.   Taking As Needed   tiZANidine  (ZANAFLEX ) 4 MG tablet Take 4 mg by mouth every 8 (eight) hours as needed for muscle spasms.   10/04/2023   levothyroxine  (SYNTHROID , LEVOTHROID) 25 MCG tablet Take 25 mcg by mouth daily before breakfast.  (Patient not taking: Reported on 10/04/2023)   Not Taking   Social History   Socioeconomic History   Marital status: Divorced    Spouse name: Not on file   Number of children: 1   Years of education: 12   Highest education level: Not on file  Occupational History   Occupation: Disabled  Tobacco Use   Smoking status: Never   Smokeless tobacco: Former    Types: Chew   Tobacco comments:    rarely uses smokeless tobacco  Vaping Use   Vaping status: Never Used  Substance and Sexual Activity   Alcohol use: Yes    Comment: daily   Drug use: No   Sexual activity: Not Currently  Other Topics Concern   Not on file  Social History Narrative   Lives alone   Caffeine use: Soda daily   Right handed   Social Drivers of Health   Financial Resource Strain: Not on file  Food Insecurity: No Food Insecurity (10/04/2023)   Hunger Vital Sign    Worried About Running Out of Food in the Last Year: Never true    Ran Out of Food in the Last Year: Never true  Transportation Needs: No Transportation Needs (10/04/2023)   PRAPARE - Administrator, Civil Service (Medical): No    Lack of Transportation (Non-Medical): No  Physical Activity: Not on file  Stress:  Not on file  Social Connections: Socially Isolated (10/04/2023)   Social Connection and Isolation Panel [NHANES]    Frequency of Communication with Friends and Family: More than three times a week    Frequency of Social Gatherings with Friends and Family: More than three times a week    Attends Religious Services: Never    Database administrator or Organizations: No    Attends Banker Meetings: Never    Marital Status: Divorced  Catering manager Violence: Not At Risk (10/04/2023)   Humiliation, Afraid, Rape, and Kick questionnaire    Fear of Current or Ex-Partner:  No    Emotionally Abused: No    Physically Abused: No    Sexually Abused: No    Family History  Problem Relation Age of Onset   Cancer Mother    Other Father    CAD Brother      Vitals:   10/04/23 2331 10/05/23 0346 10/05/23 0500 10/05/23 0810  BP: (!) 138/98 115/87  (!) 119/90  Pulse: (!) 114 (!) 112  (!) 113  Resp: 19 18  17   Temp: 97.6 F (36.4 C) 98 F (36.7 C)  98 F (36.7 C)  TempSrc:    Oral  SpO2: 99% 100%  99%  Weight:   109 kg   Height:        PHYSICAL EXAM General: Chronically ill-appearing male, well nourished, in no acute distress. HEENT: Normocephalic and atraumatic. Neck: No JVD.   Lungs: Normal respiratory effort on room air. Mild bibasilar crackles Heart: HRR, elevated rate. Normal S1 and S2 without gallops or murmurs.  Abdomen: Non-distended appearing.  Msk: Normal strength and tone for age. Extremities: Warm and well perfused. No clubbing, cyanosis. 1+ pitting edema bilaterally R> L.  Neuro: Alert and oriented X 3. Psych: Answers questions appropriately.   Labs: Basic Metabolic Panel: Recent Labs    10/04/23 1154 10/04/23 1536  NA 143  --   K 4.2  --   CL 112*  --   CO2 24  --   GLUCOSE 81  --   BUN 13  --   CREATININE 1.20  --   CALCIUM  8.7*  --   MG  --  2.3  PHOS  --  4.3   Liver Function Tests: Recent Labs    10/04/23 1154  AST 30  ALT 17  ALKPHOS 76   BILITOT 1.3*  PROT 6.3*  ALBUMIN 3.4*   No results for input(s): "LIPASE", "AMYLASE" in the last 72 hours. CBC: Recent Labs    10/04/23 1105  WBC 6.9  NEUTROABS 4.4  HGB 11.0*  HCT 34.7*  MCV 93.3  PLT 273   Cardiac Enzymes: Recent Labs    10/04/23 1105  TROPONINIHS 35*   BNP: Recent Labs    10/04/23 1105  BNP 506.4*   D-Dimer: No results for input(s): "DDIMER" in the last 72 hours. Hemoglobin A1C: No results for input(s): "HGBA1C" in the last 72 hours. Fasting Lipid Panel: No results for input(s): "CHOL", "HDL", "LDLCALC", "TRIG", "CHOLHDL", "LDLDIRECT" in the last 72 hours. Thyroid Function Tests: Recent Labs    10/04/23 1536  TSH 2.841   Anemia Panel: Recent Labs    10/04/23 1536  VITAMINB12 138*  TIBC 392  IRON  41*     Radiology: CT HEAD WO CONTRAST ( ) Result Date: 10/04/2023 CLINICAL DATA:  Head trauma, coagulopathy (Age 28-64y) Head trauma, skull fracture or hematoma (Age 63-64y) EXAM: CT HEAD WITHOUT CONTRAST TECHNIQUE: Contiguous axial images were obtained from the base of the skull through the vertex without intravenous contrast. RADIATION DOSE REDUCTION: This exam was performed according to the departmental dose-optimization program which includes automated exposure control, adjustment of the mA and/or kV according to patient size and/or use of iterative reconstruction technique. COMPARISON:  Head CT 10/16/2022 FINDINGS: Brain: No acute intracranial hemorrhage. No subdural or extra-axial collection. Unchanged atrophy, advanced for age. Stable periventricular and deep white matter hypodensities. Unchanged lacunar infarct in the left basal ganglia. Vascular: Atherosclerosis of skullbase vasculature without hyperdense vessel or abnormal calcification. Skull: Left-sided craniotomy.  No skull fracture. Sinuses/Orbits: No acute finding. Other: Frontal  scalp hematoma to the left of midline. IMPRESSION: 1. Frontal scalp hematoma to the left of midline. No  acute intracranial abnormality. No skull fracture. 2. Unchanged atrophy, remote left basal gangliar lacunar infarct, and periventricular white matter change. Electronically Signed   By: Chadwick Colonel M.D.   On: 10/04/2023 16:50   DG Chest Port 1 View Result Date: 10/04/2023 CLINICAL DATA:  Shortness of breath EXAM: PORTABLE CHEST 1 VIEW COMPARISON:  Chest radiograph 10/12/2022 FINDINGS: Cardiomegaly. Status post median sternotomy. Pulmonary vascular redistribution and mild interstitial opacities. No pleural effusion or pneumothorax. IMPRESSION: Cardiomegaly with mild edema. Electronically Signed   By: Jone Neither M.D.   On: 10/04/2023 14:38   DG Thoracic Spine 2 View Result Date: 10/04/2023 CLINICAL DATA:  Status post fall EXAM: THORACIC SPINE 2 VIEWS COMPARISON:  None Available. 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 Electronically Signed   By: Fredrich Jefferson M.D.   On: 10/04/2023 14:02    ECHO ordered  TELEMETRY reviewed by me 10/05/2023: sinus tachycardia, rate 110s  EKG reviewed by me: not done on admission. Ordered  Data reviewed by me 10/05/2023: last 24h vitals tele labs imaging I/O ED provider note, admission H&P.  Principal Problem:   Acute on chronic diastolic (congestive) heart failure (HCC) Active Problems:   Fall   Acute hypoxic respiratory failure (HCC)    ASSESSMENT AND PLAN:  Cody Burns is a 58 y.o. male  with a past medical history of chronic HFpEF, hypertension, hyperlipidemia, hx CVA, multiple sclerosis, hypothyroidism, orthostatic hypotension, alcohol use disorder who presented to the ED on 10/04/2023 after frequent falls and worsening shortness of breath and orthopnea. Cardiology was consulted for further evaluation.   #  Acute on chronic HFpEF # Hypertension # Hyperlipidemia # Frequent falls # Hx CVA Patient presented to ED after frequent falls in setting of multiple sclerosis and worsening SOB, orthopnea, LE swelling for past 3 weeks. S/p 2x IV lasix 40 mg in ED with good UOP. BNP elevated at 506. Trops minimally elevated at 35. EKG not done this admission. BP stable and HR elevated.    -Echo ordered. -EKG ordered -Continue IV lasix 40 mg twice daily. Closely monitor UOP, renal function and electrolytes.  -Consider adding SGLT2i for GDMT optimization and augment diuresis if renal function remains stable.  -Continue home ASA 81 mg, atorvastatin  80 mg, plavix  75 mg, zetia 10 mg daily. -Minimally elevated troponin most consistent with demand/supply mismatch and not ACS.    This patient's plan of care was discussed and created with Dr. Custovic and she is in agreement.  Signed: Creighton Doffing, PA-C  10/05/2023, 1:18 PM [facility]

## 2023-10-06 DIAGNOSIS — I5033 Acute on chronic diastolic (congestive) heart failure: Secondary | ICD-10-CM | POA: Diagnosis not present

## 2023-10-06 LAB — BASIC METABOLIC PANEL WITH GFR
Anion gap: 9 (ref 5–15)
BUN: 16 mg/dL (ref 6–20)
CO2: 24 mmol/L (ref 22–32)
Calcium: 9 mg/dL (ref 8.9–10.3)
Chloride: 105 mmol/L (ref 98–111)
Creatinine, Ser: 1.19 mg/dL (ref 0.61–1.24)
GFR, Estimated: >60 mL/min (ref >60)
Glucose, Bld: 89 mg/dL (ref 70–99)
Potassium: 3.5 mmol/L (ref 3.5–5.1)
Sodium: 138 mmol/L (ref 135–145)

## 2023-10-06 LAB — CBC
HCT: 35.7 % — ABNORMAL LOW (ref 39.0–52.0)
Hemoglobin: 11.7 g/dL — ABNORMAL LOW (ref 13.0–17.0)
MCH: 28.7 pg (ref 26.0–34.0)
MCHC: 32.8 g/dL (ref 30.0–36.0)
MCV: 87.5 fL (ref 80.0–100.0)
Platelets: 333 10*3/uL (ref 150–400)
RBC: 4.08 MIL/uL — ABNORMAL LOW (ref 4.22–5.81)
RDW: 15.5 % (ref 11.5–15.5)
WBC: 6.3 10*3/uL (ref 4.0–10.5)
nRBC: 0 % (ref 0.0–0.2)

## 2023-10-06 LAB — PHOSPHORUS: Phosphorus: 3.8 mg/dL (ref 2.5–4.6)

## 2023-10-06 LAB — T4, FREE: Free T4: 0.6 ng/dL — ABNORMAL LOW (ref 0.61–1.12)

## 2023-10-06 LAB — MAGNESIUM: Magnesium: 2.1 mg/dL (ref 1.7–2.4)

## 2023-10-06 MED ORDER — VITAMIN C 500 MG PO TABS
500.0000 mg | ORAL_TABLET | Freq: Every day | ORAL | Status: DC
  Administered 2023-10-06 – 2023-10-08 (×3): 500 mg via ORAL
  Filled 2023-10-06 (×3): qty 1

## 2023-10-06 MED ORDER — EMPAGLIFLOZIN 10 MG PO TABS
10.0000 mg | ORAL_TABLET | Freq: Every day | ORAL | Status: DC
  Administered 2023-10-06 – 2023-10-08 (×3): 10 mg via ORAL
  Filled 2023-10-06 (×3): qty 1

## 2023-10-06 MED ORDER — POTASSIUM CHLORIDE CRYS ER 20 MEQ PO TBCR
40.0000 meq | EXTENDED_RELEASE_TABLET | Freq: Once | ORAL | Status: AC
  Administered 2023-10-06: 40 meq via ORAL
  Filled 2023-10-06: qty 2

## 2023-10-06 MED ORDER — POLYSACCHARIDE IRON COMPLEX 150 MG PO CAPS
150.0000 mg | ORAL_CAPSULE | Freq: Every day | ORAL | Status: DC
Start: 2023-10-06 — End: 2023-10-08
  Administered 2023-10-06 – 2023-10-08 (×3): 150 mg via ORAL
  Filled 2023-10-06 (×3): qty 1

## 2023-10-06 MED ORDER — CARVEDILOL 3.125 MG PO TABS
3.1250 mg | ORAL_TABLET | Freq: Two times a day (BID) | ORAL | Status: DC
  Administered 2023-10-06 (×2): 3.125 mg via ORAL
  Filled 2023-10-06 (×2): qty 1

## 2023-10-06 NOTE — Progress Notes (Signed)
   10/06/23 1300  Spiritual Encounters  Type of Visit Initial  Care provided to: Patient  Conversation partners present during encounter Other (comment) Tax adviser)  Referral source Other (comment) Tax adviser)  Reason for visit Routine spiritual support  OnCall Visit No   Chaplain visited patient per suggestion of staff while rounding on the Unit.  Patient welcomed Chaplain visit and shared life experiences from the [employer] and his days of service.  Patient also shared health concerns related to MS and surgeries.  Patient shared that he's being discharged tomorrow but needs to make a selection from the list of facilities.  Chaplain offered assistance to patient for completing the forms but patient declined assistance.  Chaplain offered reflective listening and a compassionate presence.    Rev. Rana M. Nolon Baxter, M.Div. Chaplain Resident [facility]

## 2023-10-06 NOTE — TOC Progression Note (Signed)
 Transition of Care [facility]) - Progression Note    Patient Details  Name: Cody Burns MRN: [id number] Date of Birth: November 01, 1965  [facility] CM/SW Contact  Alexandra Ice, RN Phone Number: 10/06/2023, 1:20 PM  Clinical Narrative:     Met with patient at bedside, and discussed TOC role and purpose of visit. He is agreeable to SNF at discharge for additional rehab. Provided SNF list, printed from [email or web address] Will follow-up with patient for choice.       Expected Discharge Plan and Services                                               Social Determinants of Health (SDOH) Interventions SDOH Screenings   Food Insecurity: No Food Insecurity (10/04/2023)  Housing: Low Risk  (10/04/2023)  Transportation Needs: No Transportation Needs (10/04/2023)  Utilities: Not At Risk (10/04/2023)  Social Connections: Socially Isolated (10/04/2023)  Tobacco Use: Medium Risk (10/04/2023)    Readmission Risk Interventions     No data to display

## 2023-10-06 NOTE — Progress Notes (Signed)
 [facility] Progress Note  Patient: Cody Burns    VHQ:[id number]  DOA: 10/04/2023     Date of Service: the patient was seen and examined on 10/06/2023  Chief Complaint  Patient presents with   Fall        Brief hospital course: Cody Burns is a 58 y.o. male with medical history significant for CVA, hypertension, multiple sclerosis, chronic diastolic HF, hypothyroidism, anxiety, allergies, alcohol use disorder, orthostatic hypotension and HLD who presented to the ED after a fall. Patient reports he has a history of falls due to his MS but over the last 1 to 2 months, he has had more falls. He normally ambulates with a walker. About a week and a half ago, he noticed some shaking in his left leg which moved to his right leg he suddenly fell on his face sustaining a forehead hematoma and facial bruising. Per brother, patient was advised to call EMS but he did not.  Since then, he has had some difficulty breathing.  He has also noticed swelling in his lower extremities. Today, while he was walking, he started having another episode of leg shaking and eventually fell again but this time on his back, hitting his head on the floor. He denies any headaches, vision changes, dizziness, chest pain, nausea, vomiting but continues to endorse leg weakness, dizzy on exertion and shortness of breath.   ED Course: Initial vitals show temp 98.1, RR 18, HR 109, BP 86/65, improved to 125/97, SpO2 100% on room air, desatted to 88% and placed on 2 L [place].  Labs significant for normal white count, anemia with Hgb 11.0, BNP 506, normal kidney function and LFTs, normal TSH, mag and Phos. CXR showed cardiomegaly with mild pulmonary vascular congestion. CT head shows frontal scalp hematoma but no acute intracranial abnormalities or skull fracture.  Patient received IM Toradol  30 mg x 1 and nebulized treatment. TRH was consulted for admission.   Assessment and Plan:  # Acute on chronic diastolic HF # Acute hypoxic  respiratory failure Last TTE 10/13/2022 showed EF 50-55%, moderate LVH, G1DD, mild to moderate MVR Has had progressive shortness of breath and dizziness on exertion over the last 2 weeks. Resolved after IV lasix - CXR shows pulmonary edema, BNP elevated to 500 Continue IV Lasix 40 mg BID 5/14 started empagliflozin 10 mg for GDMT optimization Started Coreg 3.125 mg p.o. twice daily due to persistent tachycardia - Check echocardiogram, still pending - Continue supplemental O2, wean as able - Strict I&O's, daily weight - Telemetry Cardiology consult appreciated  # Fall - Presented after 2 falls in the last 2 weeks, sustained forehead hematoma from previous fall - CT head shows frontal scalp hematoma but no acute intracranial abnormality. - Apply ice pack to hematoma - PT/OT eval done, recommended SNF placement Vitamin B12 deficiency, Vitamin D  level within normal range Continue fall precautions   # Multiple sclerosis - Has had recurrent falls due to this. - Continue Lamictal , Trileptal , Lyrica, Seroquel  and as needed tizanidine  - PT evaluation as above - Follow-up vitamin D  and vitamin B12 levels   # Alcohol use disorder - Reports drinking 1-2 glasses of jim beam liquor a day - Continue folic acid  - Check CIWA   # HTN # Orthostatic hypotension - BP stable with SBP in the 90s to 120s - CTM 5/13 Orthostatic vitals within normal range  # HLD # Hx of CVA # CAD s/p stenting - Continue aspirin , Plavix , atorvastatin  and Zetia   # Anxiety -  Continue bupropion  and as needed hydroxyzine  # Vitamin B12 deficiency: Vitamin B12 level 138, goal >400. started vitamin B12 1000 mcg IM injection daily during hospital stay, followed by oral supplement.  Follow-up PCP to repeat vitamin B12 level after 3 to 6 months.  # Iron  deficiency, transferrin saturation 11%, started oral iron  supplement with vitamin C.  Follow-up with PCP to repeat iron  profile after 3 to 6 months    Body mass index  is 31.7 kg/m.  Interventions:  Diet: Heart healthy diet, fluid restriction 1.5 L/day DVT Prophylaxis: Subcutaneous Lovenox    Advance goals of care discussion: Full code  Family Communication: family was present at bedside, at the time of interview.  The pt provided permission to discuss medical plan with the family. Opportunity was given to ask question and all questions were answered satisfactorily.   Disposition:  Pt is from Home, admitted with CHF and frequent falls, still on IV Lasix and risk of fall, which precludes a safe discharge. Discharge to SNF, when stable, most likely in 1 to 2 days.  Subjective: No significant events overnight, shortness of breath resolved.  Lower extremity edema is improving.  Denies any chest pain palpitation, no any other complaints.  Physical Exam: General: NAD, lying comfortably Appear in no distress, affect appropriate Eyes: PERRLA ENT: Oral Mucosa Clear, moist  Neck: no JVD,  Cardiovascular: S1 and S2 Present, no Murmur,  Respiratory: good respiratory effort, Bilateral Air entry equal and Decreased, no Crackles, no wheezes Abdomen: Bowel Sound present, Soft and no tenderness,  Skin: no rashes Extremities: 1-2+ pedal edema, no calf tenderness Neurologic: without any new focal findings Gait not checked due to patient safety concerns  Vitals:   10/06/23 0446 10/06/23 0458 10/06/23 0738 10/06/23 0943  BP: (!) 106/90  (!) 139/96 (!) 136/52  Pulse: (!) 102  (!) 115 (!) 112  Resp: 18  18 16   Temp: 98.2 F (36.8 C)  98.4 F (36.9 C)   TempSrc:      SpO2: 98%  95% 97%  Weight:  103.1 kg    Height:        Intake/Output Summary (Last 24 hours) at 10/06/2023 1511 Last data filed at 10/06/2023 0830 Gross per 24 hour  Intake --  Output 3500 ml  Net -3500 ml   Filed Weights   10/04/23 1100 10/05/23 0500 10/06/23 0458  Weight: 100.4 kg 109 kg 103.1 kg    Data Reviewed: I have personally reviewed and interpreted daily labs, tele strips,  imagings as discussed above. I reviewed all nursing notes, pharmacy notes, vitals, pertinent old records I have discussed plan of care as described above with RN and patient/family.  CBC: Recent Labs  Lab 10/04/23 1105 10/06/23 0238  WBC 6.9 6.3  NEUTROABS 4.4  --   HGB 11.0* 11.7*  HCT 34.7* 35.7*  MCV 93.3 87.5  PLT 273 333   Basic Metabolic Panel: Recent Labs  Lab 10/04/23 1154 10/04/23 1536 10/06/23 0238  NA 143  --  138  K 4.2  --  3.5  CL 112*  --  105  CO2 24  --  24  GLUCOSE 81  --  89  BUN 13  --  16  CREATININE 1.20  --  1.19  CALCIUM  8.7*  --  9.0  MG  --  2.3 2.1  PHOS  --  4.3 3.8    Studies: No results found.   Scheduled Meds:  vitamin C  500 mg Oral Daily   aspirin   EC  81 mg Oral Daily   atorvastatin   80 mg Oral QHS   buPROPion   300 mg Oral Daily   carvedilol  3.125 mg Oral BID WC   clopidogrel   75 mg Oral Daily   cyanocobalamin   1,000 mcg Intramuscular Q1200   Followed by   Cecily Cohen ON 10/12/2023] vitamin B-12  1,000 mcg Oral Daily   empagliflozin  10 mg Oral Daily   enoxaparin  (LOVENOX ) injection  50 mg Subcutaneous Q24H   escitalopram   20 mg Oral Daily   ezetimibe  10 mg Oral Daily   folic acid   500 mcg Oral Daily   furosemide  40 mg Intravenous BID   iron  polysaccharides  150 mg Oral Daily   lamoTRIgine   25 mg Oral Daily   montelukast   10 mg Oral Daily   OXcarbazepine   150 mg Oral BID   pregabalin  150 mg Oral TID   QUEtiapine   100 mg Oral QHS   Continuous Infusions: PRN Meds: acetaminophen  **OR** acetaminophen , hydrOXYzine, ipratropium-albuterol, ondansetron  **OR** ondansetron  (ZOFRAN ) IV, senna-docusate, tiZANidine   Time spent: 40 minutes  Author: Althia Atlas. MD [facility] 10/06/2023 3:11 PM  To reach On-call, see care teams to locate the attending and reach out to them via [email or web address]. If 7PM-7AM, please contact night-coverage If you still have difficulty reaching the attending provider, please page the [facility] (Director  on Call) for [facility] on amion for assistance.

## 2023-10-06 NOTE — Plan of Care (Signed)

## 2023-10-06 NOTE — Progress Notes (Signed)
 [facility] CLINIC CARDIOLOGY PROGRESS NOTE       Patient ID: Cody Burns MRN: [id number] DOB/AGE: October 25, 1965 58 y.o.  Admit date: 10/04/2023 Referring Physician Dr, [facility] Primary Cardiologist Dr. Beau Bound (saw 2019) Reason for Consultation AoCHF  HPI: Cody Burns is a 58 y.o. male  with a past medical history of chronic HFpEF, hypertension, hyperlipidemia, coronary artery disease s/p PCI, hx CVA, multiple sclerosis, hypothyroidism, orthostatic hypotension, alcohol use disorder who presented to the ED on 10/04/2023 after frequent falls and worsening shortness of breath and orthopnea. Cardiology was consulted for further evaluation.   Interval History: -Patient seen and examined this and laying comfortably in hospital bed at a slight incline. Patient states he feels much better today. Reports improvement in breathing and LE edema this AM. -Patients BP and HR elevated this AM.  -UOP yesterday 4.69L, with stable Cr this AM.  -Electrolytes are stable.  -Patient remains on room air with stable SpO2.    Review of systems complete and found to be negative unless listed above    Past Medical History:  Diagnosis Date   Blackout 2018   several episodes with unknown etiology. cause of his ankle break. awaiting neuro workup   CVA (cerebral vascular accident) (HCC)    Hypertension    Hypothyroidism    Multiple sclerosis (HCC) 1990   Myocardial infarct (HCC)    Optic neuritis    x 3. takes steriods and it resolves.   Seizures (HCC)    as a child. diagnosed with MS and once treated, seizures resolved.   Sleep apnea     Past Surgical History:  Procedure Laterality Date   CORONARY ARTERY BYPASS GRAFT  2015   CORONARY STENT INTERVENTION N/A 06/07/2017   Procedure: CORONARY STENT INTERVENTION;  Surgeon: Antonette Batters, MD;  Location: [facility] INVASIVE CV LAB;  Service: Cardiovascular;  Laterality: N/A;   JOINT REPLACEMENT Left 2007   left  hip replacement   LEFT HEART CATH AND CORS/GRAFTS ANGIOGRAPHY N/A 06/07/2017   Procedure: LEFT HEART CATH AND CORS/GRAFTS ANGIOGRAPHY;  Surgeon: Michelle Aid, MD;  Location: [facility] INVASIVE CV LAB;  Service: Cardiovascular;  Laterality: N/A;   ORIF ANKLE FRACTURE Right 09/22/2016   Procedure: OPEN REDUCTION INTERNAL FIXATION (ORIF) ANKLE FRACTURE;  Surgeon: Elner Hahn, MD;  Location: [facility] ORS;  Service: Orthopedics;  Laterality: Right;   quadruple cardiac bypass  2015    Medications Prior to Admission  Medication Sig Dispense Refill Last Dose/Taking   Alirocumab 75 MG/ML SOAJ Inject 75 mg into the skin every 14 (fourteen) days.   Past Month   aspirin  EC 81 MG tablet Take 81 mg by mouth daily.   10/04/2023   atorvastatin  (LIPITOR) 80 MG tablet Take 80 mg by mouth at bedtime.   10/03/2023   buPROPion  (WELLBUTRIN  XL) 300 MG 24 hr tablet Take 300 mg by mouth daily.   10/04/2023   clopidogrel  (PLAVIX ) 75 MG tablet Take 75 mg by mouth daily.   10/04/2023 Morning   docusate sodium  (COLACE) 50 MG capsule Take 50 mg by mouth 2 (two) times daily.   Taking   escitalopram  (LEXAPRO ) 20 MG tablet Take 20 mg by mouth daily.   10/04/2023   ezetimibe (ZETIA) 10 MG tablet Take 10 mg by mouth daily.   10/04/2023   folic acid  (FOLVITE ) 400 MCG tablet Take 400 mcg by mouth daily.   10/04/2023   hydrOXYzine (ATARAX) 10 MG tablet Take 20 mg by mouth once as  needed for anxiety.   10/04/2023   lamoTRIgine  (LAMICTAL ) 25 MG tablet Take 25 mg by mouth daily.   10/04/2023   montelukast  (SINGULAIR ) 10 MG tablet Take 10 mg by mouth daily.    10/04/2023   OXcarbazepine  (TRILEPTAL ) 150 MG tablet Take 1 tablet by mouth 2 (two) times daily.   10/04/2023   pregabalin (LYRICA) 150 MG capsule Take 150 mg by mouth 3 (three) times daily.   10/04/2023   QUEtiapine  (SEROQUEL ) 100 MG tablet Take 100 mg by mouth at bedtime.   10/03/2023   senna-docusate (SENOKOT-S) 8.6-50 MG tablet Take 2 tablets by mouth 2 (two) times daily as needed.   Taking  As Needed   tiZANidine  (ZANAFLEX ) 4 MG tablet Take 4 mg by mouth every 8 (eight) hours as needed for muscle spasms.   10/04/2023   levothyroxine  (SYNTHROID , LEVOTHROID) 25 MCG tablet Take 25 mcg by mouth daily before breakfast.  (Patient not taking: Reported on 10/04/2023)   Not Taking   Social History   Socioeconomic History   Marital status: Divorced    Spouse name: Not on file   Number of children: 1   Years of education: 12   Highest education level: Not on file  Occupational History   Occupation: Disabled  Tobacco Use   Smoking status: Never   Smokeless tobacco: Former    Types: Chew   Tobacco comments:    rarely uses smokeless tobacco  Vaping Use   Vaping status: Never Used  Substance and Sexual Activity   Alcohol use: Yes    Comment: daily   Drug use: No   Sexual activity: Not Currently  Other Topics Concern   Not on file  Social History Narrative   Lives alone   Caffeine use: Soda daily   Right handed   Social Drivers of Health   Financial Resource Strain: Not on file  Food Insecurity: No Food Insecurity (10/04/2023)   Hunger Vital Sign    Worried About Running Out of Food in the Last Year: Never true    Ran Out of Food in the Last Year: Never true  Transportation Needs: No Transportation Needs (10/04/2023)   PRAPARE - Administrator, Civil Service (Medical): No    Lack of Transportation (Non-Medical): No  Physical Activity: Not on file  Stress: Not on file  Social Connections: Socially Isolated (10/04/2023)   Social Connection and Isolation Panel [NHANES]    Frequency of Communication with Friends and Family: More than three times a week    Frequency of Social Gatherings with Friends and Family: More than three times a week    Attends Religious Services: Never    Database administrator or Organizations: No    Attends Banker Meetings: Never    Marital Status: Divorced  Catering manager Violence: Not At Risk (10/04/2023)   Humiliation,  Afraid, Rape, and Kick questionnaire    Fear of Current or Ex-Partner: No    Emotionally Abused: No    Physically Abused: No    Sexually Abused: No    Family History  Problem Relation Age of Onset   Cancer Mother    Other Father    CAD Brother      Vitals:   10/06/23 0446 10/06/23 0458 10/06/23 0738 10/06/23 0943  BP: (!) 106/90  (!) 139/96 (!) 136/52  Pulse: (!) 102  (!) 115 (!) 112  Resp: 18  18 16   Temp: 98.2 F (36.8 C)  98.4 F (36.9 C)  TempSrc:      SpO2: 98%  95% 97%  Weight:  103.1 kg    Height:        PHYSICAL EXAM General: Chronically ill-appearing male, well nourished, in no acute distress. HEENT: Normocephalic and atraumatic. Neck: No JVD.   Lungs: Normal respiratory effort on room air. CTAB. Heart: HRR, elevated rate. Normal S1 and S2 without gallops or murmurs.  Abdomen: Non-distended appearing.  Msk: Normal strength and tone for age. Extremities: Warm and well perfused. No clubbing, cyanosis. trace pitting edema bilaterally R> L.  Neuro: Alert and oriented X 3. Psych: Answers questions appropriately.   Labs: Basic Metabolic Panel: Recent Labs    10/04/23 1154 10/04/23 1536 10/06/23 0238  NA 143  --  138  K 4.2  --  3.5  CL 112*  --  105  CO2 24  --  24  GLUCOSE 81  --  89  BUN 13  --  16  CREATININE 1.20  --  1.19  CALCIUM  8.7*  --  9.0  MG  --  2.3 2.1  PHOS  --  4.3 3.8   Liver Function Tests: Recent Labs    10/04/23 1154  AST 30  ALT 17  ALKPHOS 76  BILITOT 1.3*  PROT 6.3*  ALBUMIN 3.4*   No results for input(s): "LIPASE", "AMYLASE" in the last 72 hours. CBC: Recent Labs    10/04/23 1105 10/06/23 0238  WBC 6.9 6.3  NEUTROABS 4.4  --   HGB 11.0* 11.7*  HCT 34.7* 35.7*  MCV 93.3 87.5  PLT 273 333   Cardiac Enzymes: Recent Labs    10/04/23 1105  TROPONINIHS 35*   BNP: Recent Labs    10/04/23 1105  BNP 506.4*   D-Dimer: No results for input(s): "DDIMER" in the last 72 hours. Hemoglobin A1C: No results for  input(s): "HGBA1C" in the last 72 hours. Fasting Lipid Panel: No results for input(s): "CHOL", "HDL", "LDLCALC", "TRIG", "CHOLHDL", "LDLDIRECT" in the last 72 hours. Thyroid Function Tests: Recent Labs    10/04/23 1536  TSH 2.841   Anemia Panel: Recent Labs    10/04/23 1536  VITAMINB12 138*  TIBC 392  IRON  41*     Radiology: CT HEAD WO CONTRAST ( ) Result Date: 10/04/2023 CLINICAL DATA:  Head trauma, coagulopathy (Age 65-64y) Head trauma, skull fracture or hematoma (Age 18-64y) EXAM: CT HEAD WITHOUT CONTRAST TECHNIQUE: Contiguous axial images were obtained from the base of the skull through the vertex without intravenous contrast. RADIATION DOSE REDUCTION: This exam was performed according to the departmental dose-optimization program which includes automated exposure control, adjustment of the mA and/or kV according to patient size and/or use of iterative reconstruction technique. COMPARISON:  Head CT 10/16/2022 FINDINGS: Brain: No acute intracranial hemorrhage. No subdural or extra-axial collection. Unchanged atrophy, advanced for age. Stable periventricular and deep white matter hypodensities. Unchanged lacunar infarct in the left basal ganglia. Vascular: Atherosclerosis of skullbase vasculature without hyperdense vessel or abnormal calcification. Skull: Left-sided craniotomy.  No skull fracture. Sinuses/Orbits: No acute finding. Other: Frontal scalp hematoma to the left of midline. IMPRESSION: 1. Frontal scalp hematoma to the left of midline. No acute intracranial abnormality. No skull fracture. 2. Unchanged atrophy, remote left basal gangliar lacunar infarct, and periventricular white matter change. Electronically Signed   By: Chadwick Colonel M.D.   On: 10/04/2023 16:50   DG Chest Port 1 View Result Date: 10/04/2023 CLINICAL DATA:  Shortness of breath EXAM: PORTABLE CHEST 1 VIEW COMPARISON:  Chest radiograph 10/12/2022 FINDINGS:  Cardiomegaly. Status post median sternotomy. Pulmonary  vascular redistribution and mild interstitial opacities. No pleural effusion or pneumothorax. IMPRESSION: Cardiomegaly with mild edema. Electronically Signed   By: Jone Neither M.D.   On: 10/04/2023 14:38   DG Thoracic Spine 2 View Result Date: 10/04/2023 CLINICAL DATA:  Status post fall EXAM: THORACIC SPINE 2 VIEWS COMPARISON:  None Available. 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 Electronically Signed   By: Fredrich Jefferson M.D.   On: 10/04/2023 14:02    ECHO pending  TELEMETRY reviewed by me 10/06/2023: sinus tachycardia PACs, PVCs, rate 110s  EKG reviewed by me: sinus tachycardia, PACs, rate 116 bpm  Data reviewed by me 10/06/2023: last 24h vitals tele labs imaging I/O hospitalist note  Principal Problem:   Acute on chronic diastolic (congestive) heart failure (HCC) Active Problems:   Fall   Acute hypoxic respiratory failure (HCC)    ASSESSMENT AND PLAN:  Cody Burns is a 58 y.o. male  with a past medical history of chronic HFpEF, hypertension, hyperlipidemia, coronary artery disease s/p PCI, hx CVA, multiple sclerosis, hypothyroidism, orthostatic hypotension, alcohol use disorder who presented to the ED on 10/04/2023 after frequent falls and worsening shortness of breath and orthopnea. Cardiology was consulted for further evaluation.   # Acute on chronic HFpEF # Hypertension # Hyperlipidemia # Frequent falls # Hx CVA Patient presented to ED after frequent falls in setting of multiple sclerosis and worsening SOB, orthopnea, LE swelling for past 3 weeks. S/p 2x IV lasix 40 mg in ED with good UOP. BNP elevated at 506. Trops minimally elevated at 35. EKG not done this admission. BP stable and HR elevated. UOP yesterday 4.69L, with stable Cr this  AM.  -Echo pending. -Continue IV lasix 40 mg twice daily. Closely monitor UOP, renal function and electrolytes.  -Start empagliflozin 10 mg for GDMT optimization and augment diuresis. -Start Coreg 3.125 twice daily. Will up-titrate if BP allows. -Continue home ASA 81 mg, atorvastatin  80 mg, plavix  75 mg, zetia 10 mg daily. -Minimally elevated troponin most consistent with demand/supply mismatch and not ACS.   This patient's plan of care was discussed and created with Dr. Custovic and she is in agreement.  Signed: Creighton Doffing, PA-C  10/06/2023, 10:38 AM [facility] Cardiology

## 2023-10-07 ENCOUNTER — Other Ambulatory Visit (HOSPITAL_COMMUNITY): Payer: Self-pay

## 2023-10-07 LAB — CBC
HCT: 44.2 % (ref 39.0–52.0)
Hemoglobin: 14.2 g/dL (ref 13.0–17.0)
MCH: 28.7 pg (ref 26.0–34.0)
MCHC: 32.1 g/dL (ref 30.0–36.0)
MCV: 89.5 fL (ref 80.0–100.0)
Platelets: 422 10*3/uL — ABNORMAL HIGH (ref 150–400)
RBC: 4.94 MIL/uL (ref 4.22–5.81)
RDW: 15.5 % (ref 11.5–15.5)
WBC: 8.8 10*3/uL (ref 4.0–10.5)
nRBC: 0 % (ref 0.0–0.2)

## 2023-10-07 LAB — BASIC METABOLIC PANEL WITH GFR
Anion gap: 12 (ref 5–15)
BUN: 20 mg/dL (ref 6–20)
CO2: 27 mmol/L (ref 22–32)
Calcium: 10.2 mg/dL (ref 8.9–10.3)
Chloride: 102 mmol/L (ref 98–111)
Creatinine, Ser: 1.36 mg/dL — ABNORMAL HIGH (ref 0.61–1.24)
GFR, Estimated: 60 mL/min — ABNORMAL LOW (ref >60)
Glucose, Bld: 87 mg/dL (ref 70–99)
Potassium: 3.7 mmol/L (ref 3.5–5.1)
Sodium: 141 mmol/L (ref 135–145)

## 2023-10-07 LAB — PHOSPHORUS: Phosphorus: 4.2 mg/dL (ref 2.5–4.6)

## 2023-10-07 LAB — MAGNESIUM: Magnesium: 2.6 mg/dL — ABNORMAL HIGH (ref 1.7–2.4)

## 2023-10-07 MED ORDER — BISACODYL 10 MG RE SUPP: 10.0000 mg | Freq: Every day | RECTAL | Status: DC | PRN

## 2023-10-07 MED ORDER — BISACODYL 5 MG PO TBEC
10.0000 mg | DELAYED_RELEASE_TABLET | Freq: Every day | ORAL | Status: DC
  Administered 2023-10-07: 10 mg via ORAL
  Filled 2023-10-07: qty 2

## 2023-10-07 MED ORDER — CARVEDILOL 3.125 MG PO TABS
6.2500 mg | ORAL_TABLET | Freq: Two times a day (BID) | ORAL | Status: DC
  Administered 2023-10-07 – 2023-10-08 (×2): 6.25 mg via ORAL
  Filled 2023-10-07 (×3): qty 2

## 2023-10-07 MED ORDER — BISACODYL 5 MG PO TBEC
10.0000 mg | DELAYED_RELEASE_TABLET | Freq: Once | ORAL | Status: AC
  Administered 2023-10-07: 10 mg via ORAL
  Filled 2023-10-07: qty 2

## 2023-10-07 MED ORDER — POLYETHYLENE GLYCOL 3350 17 G PO PACK
17.0000 g | PACK | Freq: Two times a day (BID) | ORAL | Status: DC
  Administered 2023-10-07 – 2023-10-08 (×3): 17 g via ORAL
  Filled 2023-10-07 (×3): qty 1

## 2023-10-07 MED ORDER — FUROSEMIDE 40 MG PO TABS
40.0000 mg | ORAL_TABLET | Freq: Every day | ORAL | Status: DC
Start: 2023-10-07 — End: 2023-10-08
  Administered 2023-10-07 – 2023-10-08 (×2): 40 mg via ORAL
  Filled 2023-10-07 (×2): qty 1

## 2023-10-07 NOTE — Plan of Care (Signed)

## 2023-10-07 NOTE — Progress Notes (Addendum)
 [facility] CLINIC CARDIOLOGY PROGRESS NOTE       Patient ID: Cody Burns MRN: [id number] DOB/AGE: 60-Feb-1967 60 y.o.  Admit date: 10/04/2023 Referring Physician Dr, [facility], [facility] Primary Cardiologist Dr. Beau Bound (saw 2019) Reason for Consultation AoCHF  HPI: Cody Burns is a 58 y.o. male  with a past medical history of chronic HFpEF, hypertension, hyperlipidemia, coronary artery disease s/p PCI, hx CVA, multiple sclerosis, hypothyroidism, orthostatic hypotension, alcohol use disorder who presented to the ED on 10/04/2023 after frequent falls and worsening shortness of breath and orthopnea. Cardiology was consulted for further evaluation.   Interval History: -Patient seen and examined this and laying comfortably in hospital bed at a slight incline. Patient states he feels much better today. Continues to report improvement in breathing.  -Per tele sinus tachycardia vs. Atrial flutter, rates 100s this AM. Patient denies chest pain or palpitations -Patients BP stable and HR elevated this AM.  -UOP yesterday 2.0L, with Cr slightly bumped 1.19 > 1.36. -Electrolytes are stable.  -Patient remains on room air with stable SpO2.    Review of systems complete and found to be negative unless listed above    Past Medical History:  Diagnosis Date   Blackout 2018   several episodes with unknown etiology. cause of his ankle break. awaiting neuro workup   CVA (cerebral vascular accident) (HCC)    Hypertension    Hypothyroidism    Multiple sclerosis (HCC) 1990   Myocardial infarct (HCC)    Optic neuritis    x 3. takes steriods and it resolves.   Seizures (HCC)    as a child. diagnosed with MS and once treated, seizures resolved.   Sleep apnea     Past Surgical History:  Procedure Laterality Date   CORONARY ARTERY BYPASS GRAFT  2015   CORONARY STENT INTERVENTION N/A 06/07/2017   Procedure: CORONARY STENT INTERVENTION;  Surgeon: Antonette Batters,  MD;  Location: [facility] INVASIVE CV LAB;  Service: Cardiovascular;  Laterality: N/A;   JOINT REPLACEMENT Left 2007   left hip replacement   LEFT HEART CATH AND CORS/GRAFTS ANGIOGRAPHY N/A 06/07/2017   Procedure: LEFT HEART CATH AND CORS/GRAFTS ANGIOGRAPHY;  Surgeon: Michelle Aid, MD;  Location: [facility] INVASIVE CV LAB;  Service: Cardiovascular;  Laterality: N/A;   ORIF ANKLE FRACTURE Right 09/22/2016   Procedure: OPEN REDUCTION INTERNAL FIXATION (ORIF) ANKLE FRACTURE;  Surgeon: Elner Hahn, MD;  Location: [facility] ORS;  Service: Orthopedics;  Laterality: Right;   quadruple cardiac bypass  2015    Medications Prior to Admission  Medication Sig Dispense Refill Last Dose/Taking   Alirocumab 75 MG/ML SOAJ Inject 75 mg into the skin every 14 (fourteen) days.   Past Month   aspirin  EC 81 MG tablet Take 81 mg by mouth daily.   10/04/2023   atorvastatin  (LIPITOR) 80 MG tablet Take 80 mg by mouth at bedtime.   10/03/2023   buPROPion  (WELLBUTRIN  XL) 300 MG 24 hr tablet Take 300 mg by mouth daily.   10/04/2023   clopidogrel  (PLAVIX ) 75 MG tablet Take 75 mg by mouth daily.   10/04/2023 Morning   docusate sodium  (COLACE) 50 MG capsule Take 50 mg by mouth 2 (two) times daily.   Taking   escitalopram  (LEXAPRO ) 20 MG tablet Take 20 mg by mouth daily.   10/04/2023   ezetimibe (ZETIA) 10 MG tablet Take 10 mg by mouth daily.   10/04/2023   folic acid  (FOLVITE ) 400 MCG tablet Take 400 mcg by mouth daily.  10/04/2023   hydrOXYzine (ATARAX) 10 MG tablet Take 20 mg by mouth once as needed for anxiety.   10/04/2023   lamoTRIgine  (LAMICTAL ) 25 MG tablet Take 25 mg by mouth daily.   10/04/2023   montelukast  (SINGULAIR ) 10 MG tablet Take 10 mg by mouth daily.    10/04/2023   OXcarbazepine  (TRILEPTAL ) 150 MG tablet Take 1 tablet by mouth 2 (two) times daily.   10/04/2023   pregabalin (LYRICA) 150 MG capsule Take 150 mg by mouth 3 (three) times daily.   10/04/2023   QUEtiapine  (SEROQUEL ) 100 MG tablet Take 100 mg by mouth at bedtime.    10/03/2023   senna-docusate (SENOKOT-S) 8.6-50 MG tablet Take 2 tablets by mouth 2 (two) times daily as needed.   Taking As Needed   tiZANidine  (ZANAFLEX ) 4 MG tablet Take 4 mg by mouth every 8 (eight) hours as needed for muscle spasms.   10/04/2023   levothyroxine  (SYNTHROID , LEVOTHROID) 25 MCG tablet Take 25 mcg by mouth daily before breakfast.  (Patient not taking: Reported on 10/04/2023)   Not Taking   Social History   Socioeconomic History   Marital status: Divorced    Spouse name: Not on file   Number of children: 1   Years of education: 12   Highest education level: Not on file  Occupational History   Occupation: Disabled  Tobacco Use   Smoking status: Never   Smokeless tobacco: Former    Types: Chew   Tobacco comments:    rarely uses smokeless tobacco  Vaping Use   Vaping status: Never Used  Substance and Sexual Activity   Alcohol use: Yes    Comment: daily   Drug use: No   Sexual activity: Not Currently  Other Topics Concern   Not on file  Social History Narrative   Lives alone   Caffeine use: Soda daily   Right handed   Social Drivers of Health   Financial Resource Strain: Not on file  Food Insecurity: No Food Insecurity (10/04/2023)   Hunger Vital Sign    Worried About Running Out of Food in the Last Year: Never true    Ran Out of Food in the Last Year: Never true  Transportation Needs: No Transportation Needs (10/04/2023)   PRAPARE - Administrator, Civil Service (Medical): No    Lack of Transportation (Non-Medical): No  Physical Activity: Not on file  Stress: Not on file  Social Connections: Socially Isolated (10/04/2023)   Social Connection and Isolation Panel [NHANES]    Frequency of Communication with Friends and Family: More than three times a week    Frequency of Social Gatherings with Friends and Family: More than three times a week    Attends Religious Services: Never    Database administrator or Organizations: No    Attends Tax inspector Meetings: Never    Marital Status: Divorced  Catering manager Violence: Not At Risk (10/04/2023)   Humiliation, Afraid, Rape, and Kick questionnaire    Fear of Current or Ex-Partner: No    Emotionally Abused: No    Physically Abused: No    Sexually Abused: No    Family History  Problem Relation Age of Onset   Cancer Mother    Other Father    CAD Brother      Vitals:   10/06/23 2018 10/07/23 0329 10/07/23 0425 10/07/23 0834  BP: 121/88 119/79  106/81  Pulse: (!) 108 94    Resp: 17 17  18  Temp: 98.2 F (36.8 C) 98 F (36.7 C)  97.8 F (36.6 C)  TempSrc: Oral     SpO2: 98% 93%  98%  Weight:   103.1 kg   Height:        PHYSICAL EXAM General: Chronically ill-appearing male, well nourished, in no acute distress. HEENT: Normocephalic and atraumatic. Neck: No JVD.   Lungs: Normal respiratory effort on room air. CTAB. Heart: HRR, elevated rate. Normal S1 and S2 without gallops or murmurs.  Abdomen: Non-distended appearing.  Msk: Normal strength and tone for age. Extremities: Warm and well perfused. No clubbing, cyanosis, edema. Neuro: Alert and oriented X 3. Psych: Answers questions appropriately.   Labs: Basic Metabolic Panel: Recent Labs    10/06/23 0238 10/07/23 0324  NA 138 141  K 3.5 3.7  CL 105 102  CO2 24 27  GLUCOSE 89 87  BUN 16 20  CREATININE 1.19 1.36*  CALCIUM  9.0 10.2  MG 2.1 2.6*  PHOS 3.8 4.2   Liver Function Tests: No results for input(s): "AST", "ALT", "ALKPHOS", "BILITOT", "PROT", "ALBUMIN" in the last 72 hours.  No results for input(s): "LIPASE", "AMYLASE" in the last 72 hours. CBC: Recent Labs    10/06/23 0238 10/07/23 0324  WBC 6.3 8.8  HGB 11.7* 14.2  HCT 35.7* 44.2  MCV 87.5 89.5  PLT 333 422*   Cardiac Enzymes: No results for input(s): "CKTOTAL", "CKMB", "CKMBINDEX", "TROPONINIHS" in the last 72 hours.  BNP: No results for input(s): "BNP" in the last 72 hours.  D-Dimer: No results for input(s): "DDIMER" in  the last 72 hours. Hemoglobin A1C: No results for input(s): "HGBA1C" in the last 72 hours. Fasting Lipid Panel: No results for input(s): "CHOL", "HDL", "LDLCALC", "TRIG", "CHOLHDL", "LDLDIRECT" in the last 72 hours. Thyroid Function Tests: Recent Labs    10/04/23 1536  TSH 2.841   Anemia Panel: Recent Labs    10/04/23 1536  VITAMINB12 138*  TIBC 392  IRON  41*     Radiology: CT HEAD WO CONTRAST ( ) Result Date: 10/04/2023 CLINICAL DATA:  Head trauma, coagulopathy (Age 19-64y) Head trauma, skull fracture or hematoma (Age 20-64y) EXAM: CT HEAD WITHOUT CONTRAST TECHNIQUE: Contiguous axial images were obtained from the base of the skull through the vertex without intravenous contrast. RADIATION DOSE REDUCTION: This exam was performed according to the departmental dose-optimization program which includes automated exposure control, adjustment of the mA and/or kV according to patient size and/or use of iterative reconstruction technique. COMPARISON:  Head CT 10/16/2022 FINDINGS: Brain: No acute intracranial hemorrhage. No subdural or extra-axial collection. Unchanged atrophy, advanced for age. Stable periventricular and deep white matter hypodensities. Unchanged lacunar infarct in the left basal ganglia. Vascular: Atherosclerosis of skullbase vasculature without hyperdense vessel or abnormal calcification. Skull: Left-sided craniotomy.  No skull fracture. Sinuses/Orbits: No acute finding. Other: Frontal scalp hematoma to the left of midline. IMPRESSION: 1. Frontal scalp hematoma to the left of midline. No acute intracranial abnormality. No skull fracture. 2. Unchanged atrophy, remote left basal gangliar lacunar infarct, and periventricular white matter change. Electronically Signed   By: Chadwick Colonel M.D.   On: 10/04/2023 16:50   DG Chest Port 1 View Result Date: 10/04/2023 CLINICAL DATA:  Shortness of breath EXAM: PORTABLE CHEST 1 VIEW COMPARISON:  Chest radiograph 10/12/2022 FINDINGS:  Cardiomegaly. Status post median sternotomy. Pulmonary vascular redistribution and mild interstitial opacities. No pleural effusion or pneumothorax. IMPRESSION: Cardiomegaly with mild edema. Electronically Signed   By: Jone Neither M.D.   On: 10/04/2023 14:38  DG Thoracic Spine 2 View Result Date: 10/04/2023 CLINICAL DATA:  Status post fall EXAM: THORACIC SPINE 2 VIEWS COMPARISON:  None Available. 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 Electronically Signed   By: Fredrich Jefferson M.D.   On: 10/04/2023 14:02    ECHO 10/05/23 EF 40-45%, LV mildly decreased function, LV global hypokinesis, grade 1 diastolic dysfunction, LA moderately dilated, moderate to severe MR, mild to moderate TR  TELEMETRY reviewed by me 10/07/2023: sinus tachycardia PACs, PVCs vs atrial flutter rate, 100s  EKG reviewed by me: sinus tachycardia, PACs, rate 116 bpm  Data reviewed by me 10/07/2023: last 24h vitals tele labs imaging I/O hospitalist note  Principal Problem:   Acute on chronic diastolic (congestive) heart failure (HCC) Active Problems:   Fall   Acute hypoxic respiratory failure (HCC)    ASSESSMENT AND PLAN:  Cody Burns is a 58 y.o. male  with a past medical history of chronic HFpEF, hypertension, hyperlipidemia, coronary artery disease s/p PCI, hx CVA, multiple sclerosis, hypothyroidism, orthostatic hypotension, alcohol use disorder who presented to the ED on 10/04/2023 after frequent falls and worsening shortness of breath and orthopnea. Cardiology was consulted for further evaluation.   # Acute HFrEF (newly reduced) # Hypertension # Hyperlipidemia # Frequent falls # Hx CVA Patient presented to ED after frequent falls in setting of multiple sclerosis and worsening  SOB, orthopnea, LE swelling for past 3 weeks. S/p 2x IV lasix 40 mg in ED with good UOP. BNP elevated at 506. Trops minimally elevated at 35. EKG this admission with sinus tachycardia and PACs, rate 116 bpm. Echo this admission EF 40-45%, LV global hypokinesis, LV mildly decreased function, grade 1 diastolic dysfunction, LA moderately dilated, moderate to severe MR, mild to moderate TR. EF newly reduced compared to prior echo on 09/2022. UOP yesterday 2L, with slight bump in Cr this AM.  BP stable and HR elevated. Per tele sinus tachycardia with PACs vs atrial flutter rates in 100s. - This patients CHA2DS2-VASc Score is 5. Recommend anticoagulation. However patient reports worsening frequent falls at home due to multiple sclerosis. HAS-BLED score is 3, indicating high risk for bleeding. For now will defer use of anticoagulation. Will continue to monitor tele for any atrial flutter/fibrillation. -Due to bump in Cr, Transition from IV to PO lasix 40 mg daily today. Closely monitor UOP, renal function and electrolytes.  -Continue empagliflozin 10 mg daily. -Increase Coreg to 6.25 mg twice daily. -Consider adding spirolactone +/- losartan for GDMT optimization if BP and renal function allow.  -Continue home ASA 81 mg, atorvastatin  80 mg, plavix  75 mg, zetia 10 mg daily. -Minimally elevated troponin most consistent with demand/supply mismatch and not ACS.  -Due to newly reduced EF and LV global hypokinesis will plan for ischemic evaluation at outpatient follow-up.  This patient's plan of care was discussed and created with Dr. Custovic and she is in agreement.  Signed: Creighton Doffing, PA-C  10/07/2023, 12:31 PM [facility]

## 2023-10-07 NOTE — Care Management Important Message (Signed)
 Important Message  Patient Details  Name: Cody Burns MRN: [id number] Date of Birth: 11/16/1965   Important Message Given:  Yes - Medicare IM     Aylin Rhoads W, CMA 10/07/2023, 11:55 AM

## 2023-10-07 NOTE — TOC Progression Note (Signed)
 Transition of Care [facility]) - Progression Note    Patient Details  Name: Cody Burns MRN: [id number] Date of Birth: August 10, 1965  [facility]) CM/SW Contact  Alexandra Ice, RN Phone Number: 10/07/2023, 3:04 PM  Clinical Narrative:    Received message from PT, patient chose [facility]. Selected facility via HUB.    Expected Discharge Plan: Skilled Nursing Facility Barriers to Discharge: Continued Medical Work up  Expected Discharge Plan and Services   Discharge Planning Services: CM Consult Post Acute Care Choice: Skilled Nursing Facility Living arrangements for the past 2 months: Single Family Home                   DME Agency: NA       HH Arranged: NA           Social Determinants of Health (SDOH) Interventions SDOH Screenings   Food Insecurity: No Food Insecurity (10/04/2023)  Housing: Low Risk  (10/04/2023)  Transportation Needs: No Transportation Needs (10/04/2023)  Utilities: Not At Risk (10/04/2023)  Social Connections: Socially Isolated (10/04/2023)  Tobacco Use: Medium Risk (10/04/2023)    Readmission Risk Interventions     No data to display

## 2023-10-07 NOTE — Progress Notes (Signed)
 Physical Therapy Treatment Patient Details Name: Cody Burns MRN: [id number] DOB: 02-26-1966 Today's Date: 10/07/2023   History of Present Illness Cody Burns is a 58 y.o. male with medical history significant for CVA, hypertension, multiple sclerosis, chronic diastolic HF, hypothyroidism, anxiety, allergies, alcohol use disorder, orthostatic hypotension and HLD who presented to the ED after a fall. Reports history of falls, but increased falls over past 1-2 months.    PT Comments  Pt received in chair, states he is feeling better. CGA for sit<>stand transfers, Gait training with RW in hallway with CGA and cues for upright posture and encouraged wt shift to Left side. Pt postures self towards Right side. Educated on standing Left lateral side bending stretch. Will continue to progress functionally. No LOB, dizziness, or shaking in LE's during mobility.   If plan is discharge home, recommend the following: A lot of help with walking and/or transfers;Assist for transportation;Help with stairs or ramp for entrance   Can travel by private vehicle     Yes  Equipment Recommendations  Other (comment) (TBD at next level of care)    Recommendations for Other Services       Precautions / Restrictions Precautions Precautions: Fall Restrictions Weight Bearing Restrictions Per Provider Order: No     Mobility  Bed Mobility               General bed mobility comments: up in chair pre/post session    Transfers Overall transfer level: Needs assistance Equipment used: Rolling walker (2 wheels) Transfers: Sit to/from Stand Sit to Stand: Contact guard assist           General transfer comment: Pt able to stand from EOB, BSC with CGA to Min A.    Ambulation/Gait Ambulation/Gait assistance: Contact guard assist Gait Distance (Feet): 85 Feet Assistive device: Rolling walker (2 wheels) Gait Pattern/deviations: Step-to pattern, Staggering right, Decreased weight shift to  left Gait velocity: Decreased     General Gait Details: Right lateral lean -chronic posturing   Stairs             Wheelchair Mobility     Tilt Bed    Modified Rankin (Stroke Patients Only)       Balance Overall balance assessment: Needs assistance Sitting-balance support: Feet supported, No upper extremity supported Sitting balance-Leahy Scale: Good     Standing balance support: Bilateral upper extremity supported, During functional activity, Reliant on assistive device for balance Standing balance-Leahy Scale: Fair Standing balance comment: increased reliance on RW                            Communication Communication Communication: No apparent difficulties  Cognition Arousal: Alert Behavior During Therapy: WFL for tasks assessed/performed   PT - Cognitive impairments: No apparent impairments                         Following commands: Intact      Cueing Cueing Techniques: Verbal cues  Exercises Other Exercises Other Exercises: Standing Left lateral side bending stretch    General Comments General comments (skin integrity, edema, etc.): No LE shaking or unsteadiness noted during functional activity      Pertinent Vitals/Pain Pain Assessment Pain Assessment: No/denies pain    Home Living                          Prior Function  PT Goals (current goals can now be found in the care plan section) Acute Rehab PT Goals Patient Stated Goal: Get stronger; try to stop falling Progress towards PT goals: Progressing toward goals    Frequency    Min 2X/week      PT Plan      Co-evaluation              AM-PAC PT "6 Clicks" Mobility   Outcome Measure  Help needed turning from your back to your side while in a flat bed without using bedrails?: None Help needed moving from lying on your back to sitting on the side of a flat bed without using bedrails?: None Help needed moving to and from a bed  to a chair (including a wheelchair)?: A Little Help needed standing up from a chair using your arms (e.g., wheelchair or bedside chair)?: A Little Help needed to walk in hospital room?: A Lot Help needed climbing 3-5 steps with a railing? : A Lot 6 Click Score: 18    End of Session Equipment Utilized During Treatment: Gait belt Activity Tolerance: Patient tolerated treatment well Patient left: in chair;with call bell/phone within reach;with nursing/sitter in room Nurse Communication: Mobility status PT Visit Diagnosis: Other abnormalities of gait and mobility (R26.89);Unsteadiness on feet (R26.81);Muscle weakness (generalized) (M62.81);Repeated falls (R29.6);Difficulty in walking, not elsewhere classified (R26.2)     Time: 8657-8469 PT Time Calculation (min) (ACUTE ONLY): 16 min  Charges:    $Therapeutic Activity: 8-22 mins PT General Charges $$ ACUTE PT VISIT: 1 Visit                    Melvyn Stagers, PTA  Diona Franklin 10/07/2023, 1:18 PM

## 2023-10-07 NOTE — Plan of Care (Signed)
  Problem: Education: Goal: Knowledge of General Education information will improve Description: Including pain rating scale, medication(s)/side effects and non-pharmacologic comfort measures Outcome: Progressing   Problem: Clinical Measurements: Goal: Diagnostic test results will improve Outcome: Progressing Goal: Cardiovascular complication will be avoided Outcome: Progressing   Problem: Nutrition: Goal: Adequate nutrition will be maintained Outcome: Adequate for Discharge

## 2023-10-07 NOTE — Progress Notes (Addendum)
 Heart Failure Stewardship Pharmacy Note  PCP: [facility] PCP-Cardiologist: None  HPI: Cody Burns is a 58 y.o. male with CVA, HTN, MS, chronic HFpHF, hypothyroidism, anxiety, HLD, CAD s/p PCI, and orthostatic hypotension who presented with frequent falls and worsening dyspnea, orthopnea, and LEE. On admission, BNP was 506.4 and HS-troponin was 35.  Chest x-ray noted cardiomegaly with mild edema.    Pertinent cardiac history: Left heart cath 05/2017 showed EF 45% with hypokinesis of anterior lateral wall and severe 3 vessel CAD. Echo 09/2022 showed EF 50-55% with moderate concentric LV hypertrophy, grade I diastolic dysfunction, mild/moderate TR, and mild/moderate MR. Echo this admission showed EF 40-45%, LV mildly decreased function, LV global hypokinesis, grade 1 diastolic dysfunction, LA moderately dilated, moderate to severe MR, mild to moderate TR.   Pertinent Lab Values: Creatinine, Ser  Date Value Ref Range Status  10/07/2023 1.36 (H) 0.61 - 1.24 mg/dL Final   BUN  Date Value Ref Range Status  10/07/2023 20 6 - 20 mg/dL Final  96/29/5284 16 6 - 24 mg/dL Final   Potassium  Date Value Ref Range Status  10/07/2023 3.7 3.5 - 5.1 mmol/L Final   Sodium  Date Value Ref Range Status  10/07/2023 141 135 - 145 mmol/L Final  12/28/2017 139 134 - 144 mmol/L Final   B Natriuretic Peptide  Date Value Ref Range Status  10/04/2023 506.4 (H) 0.0 - 100.0 pg/mL Final    Comment:    Performed at [facility], [address] [place], [place] [address]   Magnesium   Date Value Ref Range Status  10/07/2023 2.6 (H) 1.7 - 2.4 mg/dL Final    Comment:    Performed at [facility], [address] [place], [place] [address]   TSH  Date Value Ref Range Status  10/04/2023 2.841 0.350 - 4.500 uIU/mL Final    Comment:    Performed by a 3rd Generation assay with a functional sensitivity of <=0.01 uIU/mL. Performed at [facility], [address] [place], [place] [address]     Vital Signs: Admission weight: 100.4 kg  Temp:  [97.8 F (36.6 C)-98.2 F (36.8 C)] 97.8 F (36.6 C) (05/15 0834) Pulse Rate:  [94-117] 94 (05/15 0329) Cardiac Rhythm: Atrial fibrillation (05/15 0756) Resp:  [17-18] 18 (05/15 0834) BP: (106-121)/(79-97) 106/81 (05/15 0834) SpO2:  [93 %-98 %] 98 % (05/15 0834) Weight:  [103.1 kg (227 lb 4.7 oz)] 103.1 kg (227 lb 4.7 oz) (05/15 0425)  Intake/Output Summary (Last 24 hours) at 10/07/2023 1346 Last data filed at 10/07/2023 0900 Gross per 24 hour  Intake --  Output 2200 ml  Net -2200 ml   Current Heart Failure Medications:  Loop diuretic: furosemide 40 mg PO daily Beta-Blocker: carvedilol 6.25 mg BID ACEI/ARB/ARNI: none MRA: none SGLT2i: Jardiance 10 mg daily Other: none  Prior to admission Heart Failure Medications:  None  Assessment: 1. Acute on chronic diastolic heart failure (LVEF 40-45%) with grade I diastolic dysfunction, due to likely NICM. NYHA class II-III symptoms.  -Symptoms: Patient reports improved LEE and dyspnea since admission. Reports mild dizziness now. -Volume: Appears euvolemic. No LEE noted. Creatinine and BUN increased from yesterday. On furosemide 40 mg PO daily.  -Hemodynamics: BP low to normal. HR 90-100s.  -YQ:IHKVQQVZDG 6.25 mg BID increased today. Can consider increasing dose tomorrow if HR remains elevated. Sinus tachycardia does not appear to be due to low output. -ACEI/ARB/ARNI:Lower BPs and at this time, can consider Entresto vs ARB tomorrow if BP  increases.  -MRA: Not suitable now given current symptomatic normotension. Can consider starting tomorrow if BP increases.  -SGLT2i: Continue Jardiance 10 mg daily.   Plan: 1) Medication changes recommended at this time: - None  2) Patient assistance: - Patient receives his medications through the [place]   3) Education: -To be completed prior to discharge. - Patient has been educated on current HF medications and  potential additions to HF medication regimen - Patient verbalizes understanding that over the next few months, these medication doses may change and more medications may be added to optimize HF regimen - Patient has been educated on basic disease state pathophysiology and goals of therapy   Medication Assistance / Insurance Benefits Check: Does the patient have prescription insurance?  [facility]   [facility]     Please do not hesitate to reach out with questions or concerns,  Bevely Brush, PharmD, CPP, BCPS Heart Failure Pharmacist  Phone - [phone] 10/07/2023 3:34 PM

## 2023-10-07 NOTE — Progress Notes (Addendum)
 [facility] Progress Note  Patient: Cody Burns    XBJ:[id number]  DOA: 10/04/2023     Date of Service: the patient was seen and examined on 10/07/2023  Chief Complaint  Patient presents with   Fall        Brief hospital course: Cody Burns is a 58 y.o. male with medical history significant for CVA, hypertension, multiple sclerosis, chronic diastolic HF, hypothyroidism, anxiety, allergies, alcohol use disorder, orthostatic hypotension and HLD who presented to the ED after a fall. Patient reports he has a history of falls due to his MS but over the last 1 to 2 months, he has had more falls. He normally ambulates with a walker. About a week and a half ago, he noticed some shaking in his left leg which moved to his right leg he suddenly fell on his face sustaining a forehead hematoma and facial bruising. Per brother, patient was advised to call EMS but he did not.  Since then, he has had some difficulty breathing.  He has also noticed swelling in his lower extremities. Today, while he was walking, he started having another episode of leg shaking and eventually fell again but this time on his back, hitting his head on the floor. He denies any headaches, vision changes, dizziness, chest pain, nausea, vomiting but continues to endorse leg weakness, dizzy on exertion and shortness of breath.   ED Course: Initial vitals show temp 98.1, RR 18, HR 109, BP 86/65, improved to 125/97, SpO2 100% on room air, desatted to 88% and placed on 2 L [place].  Labs significant for normal white count, anemia with Hgb 11.0, BNP 506, normal kidney function and LFTs, normal TSH, mag and Phos. CXR showed cardiomegaly with mild pulmonary vascular congestion. CT head shows frontal scalp hematoma but no acute intracranial abnormalities or skull fracture.  Patient received IM Toradol  30 mg x 1 and nebulized treatment. TRH was consulted for admission.   Assessment and Plan:  # Acute on chronic diastolic HF # Acute hypoxic  respiratory failure Last TTE 10/13/2022 showed EF 50-55%, moderate LVH, G1DD, mild to moderate MVR Has had progressive shortness of breath and dizziness on exertion over the last 2 weeks. Resolved after IV lasix CXR shows pulmonary edema, BNP elevated to 500 S/p IV Lasix 40 mg BID, switched to Lasix 40 mg p.o. daily on 5/15 5/14 started empagliflozin 10 mg for GDMT optimization 5/15 increased Coreg 6.25 mg p.o. BID due to Pac and A. Flutter, no DOAC due to risk of fall and bleeding TTE: EF 40-45%, LV global hypokinesis, LV mildly decreased function, grade 1 diastolic dysfunction, LA moderately dilated, moderate to severe MR, mild to moderate TR. EF newly reduced compared to prior echo on 09/2022. Plan to optimize GDMT. Patient without chest pain. Plan for ischemic evaluation at outpatient follow-up.  - Continue supplemental O2, wean as able - Strict I&O's, daily weight - Telemetry Cardiology consult appreciated  # Fall - Presented after 2 falls in the last 2 weeks, sustained forehead hematoma from previous fall - CT head shows frontal scalp hematoma but no acute intracranial abnormality. - Apply ice pack to hematoma - PT/OT eval done, recommended SNF placement Vitamin B12 deficiency, Vitamin D  level within normal range Continue fall precautions   # Multiple sclerosis - Has had recurrent falls due to this. - Continue Lamictal , Trileptal , Lyrica, Seroquel  and as needed tizanidine  - PT evaluation as above - Follow-up vitamin D  and vitamin B12 levels   # Alcohol use disorder - Reports  drinking 1-2 glasses of jim beam liquor a day - Continue folic acid  - Check CIWA   # HTN # Orthostatic hypotension - BP stable with SBP in the 90s to 120s - CTM 5/13 Orthostatic vitals within normal range  # HLD # Hx of CVA # CAD s/p stenting - Continue aspirin , Plavix , atorvastatin  and Zetia   # Anxiety - Continue bupropion  and as needed hydroxyzine  # Vitamin B12 deficiency: Vitamin B12 level  138, goal >400. started vitamin B12 1000 mcg IM injection daily during hospital stay, followed by oral supplement.  Follow-up PCP to repeat vitamin B12 level after 3 to 6 months.  # Iron  deficiency, transferrin saturation 11%, started oral iron  supplement with vitamin C.  Follow-up with PCP to repeat iron  profile after 3 to 6 months    Body mass index is 31.7 kg/m.  Interventions:  Diet: Heart healthy diet, fluid restriction 1.5 L/day DVT Prophylaxis: Subcutaneous Lovenox    Advance goals of care discussion: Full code  Family Communication: family was present at bedside, at the time of interview.  The pt provided permission to discuss medical plan with the family. Opportunity was given to ask question and all questions were answered satisfactorily.   Disposition:  Pt is from Home, admitted with CHF and frequent falls, still on IV Lasix and risk of fall, which precludes a safe discharge. Discharge to SNF, when stable, most likely tomorrow a.m. if bed will be available  Subjective: No significant events overnight, denies any shortness of breath, no palpitations.  Lower extremity edema improved.  Denies any complaints   Physical Exam: General: NAD, lying comfortably Appear in no distress, affect appropriate Eyes: PERRLA ENT: Oral Mucosa Clear, moist  Neck: no JVD,  Cardiovascular: Irregular rhythm, no Murmur,  Respiratory: good respiratory effort, Bilateral Air entry equal and Decreased, no Crackles, no wheezes Abdomen: Bowel Sound present, Soft and no tenderness,  Skin: no rashes Extremities: mild pedal edema, no calf tenderness Neurologic: without any new focal findings Gait not checked due to patient safety concerns  Vitals:   10/06/23 2018 10/07/23 0329 10/07/23 0425 10/07/23 0834  BP: 121/88 119/79  106/81  Pulse: (!) 108 94    Resp: 17 17  18   Temp: 98.2 F (36.8 C) 98 F (36.7 C)  97.8 F (36.6 C)  TempSrc: Oral     SpO2: 98% 93%  98%  Weight:   103.1 kg   Height:         Intake/Output Summary (Last 24 hours) at 10/07/2023 1259 Last data filed at 10/07/2023 0900 Gross per 24 hour  Intake --  Output 2200 ml  Net -2200 ml   Filed Weights   10/05/23 0500 10/06/23 0458 10/07/23 0425  Weight: 109 kg 103.1 kg 103.1 kg    Data Reviewed: I have personally reviewed and interpreted daily labs, tele strips, imagings as discussed above. I reviewed all nursing notes, pharmacy notes, vitals, pertinent old records I have discussed plan of care as described above with RN and patient/family.  CBC: Recent Labs  Lab 10/04/23 1105 10/06/23 0238 10/07/23 0324  WBC 6.9 6.3 8.8  NEUTROABS 4.4  --   --   HGB 11.0* 11.7* 14.2  HCT 34.7* 35.7* 44.2  MCV 93.3 87.5 89.5  PLT 273 333 422*   Basic Metabolic Panel: Recent Labs  Lab 10/04/23 1154 10/04/23 1536 10/06/23 0238 10/07/23 0324  NA 143  --  138 141  K 4.2  --  3.5 3.7  CL 112*  --  105 102  CO2 24  --  24 27  GLUCOSE 81  --  89 87  BUN 13  --  16 20  CREATININE 1.20  --  1.19 1.36*  CALCIUM  8.7*  --  9.0 10.2  MG  --  2.3 2.1 2.6*  PHOS  --  4.3 3.8 4.2    Studies: No results found.   Scheduled Meds:  vitamin C  500 mg Oral Daily   aspirin  EC  81 mg Oral Daily   atorvastatin   80 mg Oral QHS   bisacodyl   10 mg Oral QHS   buPROPion   300 mg Oral Daily   carvedilol  6.25 mg Oral BID WC   clopidogrel   75 mg Oral Daily   cyanocobalamin   1,000 mcg Intramuscular Q1200   Followed by   Cecily Cohen ON 10/12/2023] vitamin B-12  1,000 mcg Oral Daily   empagliflozin  10 mg Oral Daily   enoxaparin  (LOVENOX ) injection  50 mg Subcutaneous Q24H   escitalopram   20 mg Oral Daily   ezetimibe  10 mg Oral Daily   folic acid   500 mcg Oral Daily   furosemide  40 mg Oral Daily   iron  polysaccharides  150 mg Oral Daily   lamoTRIgine   25 mg Oral Daily   montelukast   10 mg Oral Daily   OXcarbazepine   150 mg Oral BID   polyethylene glycol  17 g Oral BID   pregabalin  150 mg Oral TID   QUEtiapine   100 mg Oral  QHS   Continuous Infusions: PRN Meds: acetaminophen  **OR** acetaminophen , bisacodyl , hydrOXYzine, ipratropium-albuterol, ondansetron  **OR** ondansetron  (ZOFRAN ) IV, senna-docusate, tiZANidine   Time spent: 40 minutes  Author: Althia Atlas. MD [facility] 10/07/2023 12:59 PM  To reach On-call, see care teams to locate the attending and reach out to them via [email or web address]. If 7PM-7AM, please contact night-coverage If you still have difficulty reaching the attending provider, please page the [facility] (Director on Call) for [facility] on amion for assistance.

## 2023-10-07 NOTE — Progress Notes (Signed)
 Occupational Therapy Treatment Patient Details Name: Cody Burns MRN: [id number] DOB: 1966/05/11 Today's Date: 10/07/2023   History of present illness Cody Burns is a 58 y.o. male with medical history significant for CVA, hypertension, multiple sclerosis, chronic diastolic HF, hypothyroidism, anxiety, allergies, alcohol use disorder, orthostatic hypotension and HLD who presented to the ED after a fall. Reports history of falls, but increased falls over past 1-2 months.   OT comments  Cody Burns seen for OT treatment on this date. Upon arrival to room pt seated in chair, agreeable to tx. Pt requires CGA + RW for t/f to toilet and completed toileting while standing. Pt was CGA + RW for walking ~40 ft in hallway. Pt educated on d/c recommendations and falls safety. Pt making good progress toward goals, will continue to follow POC. Discharge recommendation remains appropriate.        If plan is discharge home, recommend the following:  A lot of help with walking and/or transfers;A lot of help with bathing/dressing/bathroom   Equipment Recommendations  Wheelchair (measurements OT)    Recommendations for Other Services      Precautions / Restrictions Precautions Precautions: Fall Restrictions Weight Bearing Restrictions Per Provider Order: No       Mobility Bed Mobility               General bed mobility comments: up in chair pre session, CGA for return to EOB    Transfers Overall transfer level: Needs assistance Equipment used: Rolling walker (2 wheels) Transfers: Sit to/from Stand Sit to Stand: Contact guard assist           General transfer comment: CGA for t/f to toilet, CGA for walking ~40 ft in hallway     Balance Overall balance assessment: Needs assistance Sitting-balance support: Feet supported, No upper extremity supported Sitting balance-Leahy Scale: Good     Standing balance support: Single extremity supported, During functional activity Standing  balance-Leahy Scale: Fair Standing balance comment: held on to back of toilet while standing                           ADL either performed or assessed with clinical judgement   ADL Overall ADL's : Needs assistance/impaired                                       General ADL Comments: CGA + RW for toilet t/f, toileting completed standing    Extremity/Trunk Assessment Upper Extremity Assessment Upper Extremity Assessment: Generalized weakness   Lower Extremity Assessment Lower Extremity Assessment: Generalized weakness        Vision       Perception     Praxis     Communication Communication Communication: No apparent difficulties   Cognition Arousal: Alert Behavior During Therapy: WFL for tasks assessed/performed Cognition: No apparent impairments                               Following commands: Intact        Cueing   Cueing Techniques: Verbal cues  Exercises      Shoulder Instructions       General Comments Mentioned feeling drowsy from new medication    Pertinent Vitals/ Pain       Pain Assessment Pain Assessment: Faces Faces Pain Scale: No hurt  Home Living  Prior Functioning/Environment              Frequency  Min 2X/week        Progress Toward Goals  OT Goals(current goals can now be found in the care plan section)  Progress towards OT goals: Progressing toward goals  Acute Rehab OT Goals Patient Stated Goal: to go home OT Goal Formulation: With patient Time For Goal Achievement: 10/21/23 Potential to Achieve Goals: Fair ADL Goals Pt Will Perform Grooming: with modified independence;standing Pt Will Perform Lower Body Dressing: with modified independence;sit to/from stand Pt Will Transfer to Toilet: with modified independence;ambulating;regular height toilet  Plan      Co-evaluation                 AM-PAC OT "6 Clicks"  Daily Activity     Outcome Measure   Help from another person eating meals?: None Help from another person taking care of personal grooming?: A Little Help from another person toileting, which includes using toliet, bedpan, or urinal?: A Lot Help from another person bathing (including washing, rinsing, drying)?: A Lot Help from another person to put on and taking off regular upper body clothing?: A Little Help from another person to put on and taking off regular lower body clothing?: A Lot 6 Click Score: 16    End of Session Equipment Utilized During Treatment: Rolling walker (2 wheels);Gait belt  OT Visit Diagnosis: Other abnormalities of gait and mobility (R26.89);Muscle weakness (generalized) (M62.81)   Activity Tolerance Patient tolerated treatment well   Patient Left in bed;with call bell/phone within reach;with bed alarm set;with family/visitor present   Nurse Communication          Time: 8413-2440 OT Time Calculation (min): 8 min  Charges: OT General Charges $OT Visit: 1 Visit OT Treatments $Self Care/Home Management : 8-22 mins  Cody Burns, Student OT   [employer] 10/07/2023, 2:50 PM

## 2023-10-08 LAB — BASIC METABOLIC PANEL WITH GFR
Anion gap: 11 (ref 5–15)
BUN: 23 mg/dL — ABNORMAL HIGH (ref 6–20)
CO2: 24 mmol/L (ref 22–32)
Calcium: 9.6 mg/dL (ref 8.9–10.3)
Chloride: 102 mmol/L (ref 98–111)
Creatinine, Ser: 1.25 mg/dL — ABNORMAL HIGH (ref 0.61–1.24)
GFR, Estimated: >60 mL/min (ref >60)
Glucose, Bld: 92 mg/dL (ref 70–99)
Potassium: 3.6 mmol/L (ref 3.5–5.1)
Sodium: 137 mmol/L (ref 135–145)

## 2023-10-08 LAB — CBC
HCT: 42.4 % (ref 39.0–52.0)
Hemoglobin: 14.3 g/dL (ref 13.0–17.0)
MCH: 29.4 pg (ref 26.0–34.0)
MCHC: 33.7 g/dL (ref 30.0–36.0)
MCV: 87.1 fL (ref 80.0–100.0)
Platelets: 387 10*3/uL (ref 150–400)
RBC: 4.87 MIL/uL (ref 4.22–5.81)
RDW: 15.3 % (ref 11.5–15.5)
WBC: 7.3 10*3/uL (ref 4.0–10.5)
nRBC: 0 % (ref 0.0–0.2)

## 2023-10-08 LAB — PHOSPHORUS: Phosphorus: 4 mg/dL (ref 2.5–4.6)

## 2023-10-08 LAB — MAGNESIUM: Magnesium: 2.5 mg/dL — ABNORMAL HIGH (ref 1.7–2.4)

## 2023-10-08 MED ORDER — ASCORBIC ACID 500 MG PO TABS: 500.0000 mg | ORAL_TABLET | Freq: Every day | ORAL | Status: AC

## 2023-10-08 MED ORDER — CARVEDILOL 6.25 MG PO TABS: 6.2500 mg | ORAL_TABLET | Freq: Two times a day (BID) | ORAL | Status: DC

## 2023-10-08 MED ORDER — CYANOCOBALAMIN 1000 MCG PO TABS: 1000.0000 ug | ORAL_TABLET | Freq: Every day | ORAL | Status: AC

## 2023-10-08 MED ORDER — PREGABALIN 150 MG PO CAPS: 150.0000 mg | ORAL_CAPSULE | Freq: Three times a day (TID) | ORAL | 0 refills | Status: DC

## 2023-10-08 MED ORDER — EMPAGLIFLOZIN 10 MG PO TABS: 10.0000 mg | ORAL_TABLET | Freq: Every day | ORAL | Status: AC

## 2023-10-08 MED ORDER — FUROSEMIDE 40 MG PO TABS: 40.0000 mg | ORAL_TABLET | Freq: Every day | ORAL | Status: AC

## 2023-10-08 MED ORDER — POLYSACCHARIDE IRON COMPLEX 150 MG PO CAPS: 150.0000 mg | ORAL_CAPSULE | Freq: Every day | ORAL | Status: AC

## 2023-10-08 NOTE — Progress Notes (Signed)
 Handover given to facility RN Alfrieda [facility] [employer] [facility]. Patient Awaiting for EMS.

## 2023-10-08 NOTE — Progress Notes (Signed)
 [facility] CLINIC CARDIOLOGY PROGRESS NOTE       Patient ID: Cody Burns MRN: [id number] DOB/AGE: September 12, 1965 58 y.o.  Admit date: 10/04/2023 Referring Physician Dr, [facility], [facility] Primary Cardiologist Dr. Beau Bound (saw 2019) Reason for Consultation AoCHF  HPI: [name] is a 58 y.o. male  with a past medical history of chronic HFpEF, hypertension, hyperlipidemia, coronary artery disease s/p PCI, hx CVA, multiple sclerosis, hypothyroidism, orthostatic hypotension, alcohol use disorder who presented to the ED on 10/04/2023 after frequent falls and worsening shortness of breath and orthopnea. Cardiology was consulted for further evaluation.   Interval History: -Patient seen and examined this and laying comfortably in hospital bed at a slight incline. Patient states he feels much better today. Continues to report improvement in breathing.  -Per tele sinus tachycardia PACs 100s this AM. Patient denies chest pain or palpitations -Patients BP stable and HR elevated this AM.  -Electrolytes are stable.  -Patient remains on room air with stable SpO2.    Review of systems complete and found to be negative unless listed above    Past Medical History:  Diagnosis Date   Blackout 2018   several episodes with unknown etiology. cause of his ankle break. awaiting neuro workup   CVA (cerebral vascular accident) (HCC)    Hypertension    Hypothyroidism    Multiple sclerosis (HCC) 1990   Myocardial infarct (HCC)    Optic neuritis    x 3. takes steriods and it resolves.   Seizures (HCC)    as a child. diagnosed with MS and once treated, seizures resolved.   Sleep apnea     Past Surgical History:  Procedure Laterality Date   CORONARY ARTERY BYPASS GRAFT  2015   CORONARY STENT INTERVENTION N/A 06/07/2017   Procedure: CORONARY STENT INTERVENTION;  Surgeon: Antonette Batters, MD;  Location: [facility] INVASIVE CV LAB;  Service: Cardiovascular;  Laterality:  N/A;   JOINT REPLACEMENT Left 2007   left hip replacement   LEFT HEART CATH AND CORS/GRAFTS ANGIOGRAPHY N/A 06/07/2017   Procedure: LEFT HEART CATH AND CORS/GRAFTS ANGIOGRAPHY;  Surgeon: Michelle Aid, MD;  Location: [facility] INVASIVE CV LAB;  Service: Cardiovascular;  Laterality: N/A;   ORIF ANKLE FRACTURE Right 09/22/2016   Procedure: OPEN REDUCTION INTERNAL FIXATION (ORIF) ANKLE FRACTURE;  Surgeon: Elner Hahn, MD;  Location: [facility] ORS;  Service: Orthopedics;  Laterality: Right;   quadruple cardiac bypass  2015    Medications Prior to Admission  Medication Sig Dispense Refill Last Dose/Taking   Alirocumab 75 MG/ML SOAJ Inject 75 mg into the skin every 14 (fourteen) days.   Past Month   aspirin  EC 81 MG tablet Take 81 mg by mouth daily.   10/04/2023   atorvastatin  (LIPITOR) 80 MG tablet Take 80 mg by mouth at bedtime.   10/03/2023   buPROPion  (WELLBUTRIN  XL) 300 MG 24 hr tablet Take 300 mg by mouth daily.   10/04/2023   clopidogrel  (PLAVIX ) 75 MG tablet Take 75 mg by mouth daily.   10/04/2023 Morning   docusate sodium  (COLACE) 50 MG capsule Take 50 mg by mouth 2 (two) times daily.   Taking   escitalopram  (LEXAPRO ) 20 MG tablet Take 20 mg by mouth daily.   10/04/2023   ezetimibe (ZETIA) 10 MG tablet Take 10 mg by mouth daily.   10/04/2023   folic acid  (FOLVITE ) 400 MCG tablet Take 400 mcg by mouth daily.   10/04/2023   hydrOXYzine (ATARAX) 10 MG tablet Take 20 mg  by mouth once as needed for anxiety.   10/04/2023   lamoTRIgine  (LAMICTAL ) 25 MG tablet Take 25 mg by mouth daily.   10/04/2023   montelukast  (SINGULAIR ) 10 MG tablet Take 10 mg by mouth daily.    10/04/2023   OXcarbazepine  (TRILEPTAL ) 150 MG tablet Take 1 tablet by mouth 2 (two) times daily.   10/04/2023   pregabalin (LYRICA) 150 MG capsule Take 150 mg by mouth 3 (three) times daily.   10/04/2023   QUEtiapine  (SEROQUEL ) 100 MG tablet Take 100 mg by mouth at bedtime.   10/03/2023   senna-docusate (SENOKOT-S) 8.6-50 MG tablet Take 2 tablets by  mouth 2 (two) times daily as needed.   Taking As Needed   tiZANidine  (ZANAFLEX ) 4 MG tablet Take 4 mg by mouth every 8 (eight) hours as needed for muscle spasms.   10/04/2023   levothyroxine  (SYNTHROID , LEVOTHROID) 25 MCG tablet Take 25 mcg by mouth daily before breakfast.  (Patient not taking: Reported on 10/04/2023)   Not Taking   Social History   Socioeconomic History   Marital status: Divorced    Spouse name: Not on file   Number of children: 1   Years of education: 12   Highest education level: Not on file  Occupational History   Occupation: Disabled  Tobacco Use   Smoking status: Never   Smokeless tobacco: Former    Types: Chew   Tobacco comments:    rarely uses smokeless tobacco  Vaping Use   Vaping status: Never Used  Substance and Sexual Activity   Alcohol use: Yes    Comment: daily   Drug use: No   Sexual activity: Not Currently  Other Topics Concern   Not on file  Social History Narrative   Lives alone   Caffeine use: Soda daily   Right handed   Social Drivers of Health   Financial Resource Strain: Not on file  Food Insecurity: No Food Insecurity (10/04/2023)   Hunger Vital Sign    Worried About Running Out of Food in the Last Year: Never true    Ran Out of Food in the Last Year: Never true  Transportation Needs: No Transportation Needs (10/04/2023)   PRAPARE - Administrator, Civil Service (Medical): No    Lack of Transportation (Non-Medical): No  Physical Activity: Not on file  Stress: Not on file  Social Connections: Socially Isolated (10/04/2023)   Social Connection and Isolation Panel [NHANES]    Frequency of Communication with Friends and Family: More than three times a week    Frequency of Social Gatherings with Friends and Family: More than three times a week    Attends Religious Services: Never    Database administrator or Organizations: No    Attends Banker Meetings: Never    Marital Status: Divorced  Catering manager  Violence: Not At Risk (10/04/2023)   Humiliation, Afraid, Rape, and Kick questionnaire    Fear of Current or Ex-Partner: No    Emotionally Abused: No    Physically Abused: No    Sexually Abused: No    Family History  Problem Relation Age of Onset   Cancer Mother    Other Father    CAD Brother      Vitals:   10/07/23 2039 10/08/23 0408 10/08/23 0822 10/08/23 0848  BP: 131/88 101/75 102/77 104/76  Pulse: 95 60 (!) 103 (!) 59  Resp: 14 16 17 16   Temp: 98.2 F (36.8 C) 97.6 F (36.4 C)  (!)  97.5 F (36.4 C)  TempSrc: Oral Oral  Oral  SpO2: 99% 95% 99% 98%  Weight:  102.5 kg    Height:        PHYSICAL EXAM General: Chronically ill-appearing male, well nourished, in no acute distress. HEENT: Normocephalic and atraumatic. Neck: No JVD.   Lungs: Normal respiratory effort on room air. CTAB. Heart: HRR, elevated rate. Normal S1 and S2 without gallops or murmurs.  Abdomen: Non-distended appearing.  Msk: Normal strength and tone for age. Extremities: Warm and well perfused. No clubbing, cyanosis, edema. Neuro: Alert and oriented X 3. Psych: Answers questions appropriately.   Labs: Basic Metabolic Panel: Recent Labs    10/07/23 0324 10/08/23 0515  NA 141 137  K 3.7 3.6  CL 102 102  CO2 27 24  GLUCOSE 87 92  BUN 20 23*  CREATININE 1.36* 1.25*  CALCIUM  10.2 9.6  MG 2.6* 2.5*  PHOS 4.2 4.0   Liver Function Tests: No results for input(s): "AST", "ALT", "ALKPHOS", "BILITOT", "PROT", "ALBUMIN" in the last 72 hours.  No results for input(s): "LIPASE", "AMYLASE" in the last 72 hours. CBC: Recent Labs    10/07/23 0324 10/08/23 0515  WBC 8.8 7.3  HGB 14.2 14.3  HCT 44.2 42.4  MCV 89.5 87.1  PLT 422* 387   Cardiac Enzymes: No results for input(s): "CKTOTAL", "CKMB", "CKMBINDEX", "TROPONINIHS" in the last 72 hours.  BNP: No results for input(s): "BNP" in the last 72 hours.  D-Dimer: No results for input(s): "DDIMER" in the last 72 hours. Hemoglobin A1C: No  results for input(s): "HGBA1C" in the last 72 hours. Fasting Lipid Panel: No results for input(s): "CHOL", "HDL", "LDLCALC", "TRIG", "CHOLHDL", "LDLDIRECT" in the last 72 hours. Thyroid Function Tests: No results for input(s): "TSH", "T4TOTAL", "T3FREE", "THYROIDAB" in the last 72 hours.  Invalid input(s): "FREET3"  Anemia Panel: No results for input(s): "VITAMINB12", "FOLATE", "FERRITIN", "TIBC", "IRON ", "RETICCTPCT" in the last 72 hours.    Radiology: CT HEAD WO CONTRAST ( ) Result Date: 10/04/2023 CLINICAL DATA:  Head trauma, coagulopathy (Age 28-64y) Head trauma, skull fracture or hematoma (Age 55-64y) EXAM: CT HEAD WITHOUT CONTRAST TECHNIQUE: Contiguous axial images were obtained from the base of the skull through the vertex without intravenous contrast. RADIATION DOSE REDUCTION: This exam was performed according to the departmental dose-optimization program which includes automated exposure control, adjustment of the mA and/or kV according to patient size and/or use of iterative reconstruction technique. COMPARISON:  Head CT 10/16/2022 FINDINGS: Brain: No acute intracranial hemorrhage. No subdural or extra-axial collection. Unchanged atrophy, advanced for age. Stable periventricular and deep white matter hypodensities. Unchanged lacunar infarct in the left basal ganglia. Vascular: Atherosclerosis of skullbase vasculature without hyperdense vessel or abnormal calcification. Skull: Left-sided craniotomy.  No skull fracture. Sinuses/Orbits: No acute finding. Other: Frontal scalp hematoma to the left of midline. IMPRESSION: 1. Frontal scalp hematoma to the left of midline. No acute intracranial abnormality. No skull fracture. 2. Unchanged atrophy, remote left basal gangliar lacunar infarct, and periventricular white matter change. Electronically Signed   By: Chadwick Colonel M.D.   On: 10/04/2023 16:50   DG Chest Port 1 View Result Date: 10/04/2023 CLINICAL DATA:  Shortness of breath EXAM:  PORTABLE CHEST 1 VIEW COMPARISON:  Chest radiograph 10/12/2022 FINDINGS: Cardiomegaly. Status post median sternotomy. Pulmonary vascular redistribution and mild interstitial opacities. No pleural effusion or pneumothorax. IMPRESSION: Cardiomegaly with mild edema. Electronically Signed   By: Jone Neither M.D.   On: 10/04/2023 14:38   DG Thoracic Spine 2 View Result  Date: 10/04/2023 CLINICAL DATA:  Status post fall EXAM: THORACIC SPINE 2 VIEWS COMPARISON:  None Available. 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 Electronically Signed   By: Fredrich Jefferson M.D.   On: 10/04/2023 14:02    ECHO 10/05/23 EF 40-45%, LV mildly decreased function, LV global hypokinesis, grade 1 diastolic dysfunction, LA moderately dilated, moderate to severe MR, mild to moderate TR  TELEMETRY reviewed by me 10/08/2023: sinus tachycardia PACs, PVCs rate, 100s  EKG reviewed by me: sinus tachycardia, PACs, rate 116 bpm  Data reviewed by me 10/08/2023: last 24h vitals tele labs imaging I/O hospitalist note  Principal Problem:   Acute on chronic diastolic (congestive) heart failure (HCC) Active Problems:   Fall   Acute hypoxic respiratory failure (HCC)    ASSESSMENT AND PLAN:  Cody Burns is a 58 y.o. male  with a past medical history of chronic HFpEF, hypertension, hyperlipidemia, coronary artery disease s/p PCI, hx CVA, multiple sclerosis, hypothyroidism, orthostatic hypotension, alcohol use disorder who presented to the ED on 10/04/2023 after frequent falls and worsening shortness of breath and orthopnea. Cardiology was consulted for further evaluation.   # Acute HFrEF (newly reduced) # Hypertension # Hyperlipidemia # Frequent falls # Hx CVA Patient presented to ED after  frequent falls in setting of multiple sclerosis and worsening SOB, orthopnea, LE swelling for past 3 weeks. S/p 2x IV lasix 40 mg in ED with good UOP. BNP elevated at 506. Trops minimally elevated at 35. EKG this admission with sinus tachycardia and PACs, rate 116 bpm. Echo this admission EF 40-45%, LV global hypokinesis, LV mildly decreased function, grade 1 diastolic dysfunction, LA moderately dilated, moderate to severe MR, mild to moderate TR. EF newly reduced compared to prior echo on 09/2022. UOP yesterday 2L, with slight bump in Cr this AM.  BP stable and HR elevated. Per tele sinus tachycardia with PACs vs atrial flutter rates in 100s. - This patients CHA2DS2-VASc Score is 5. Recommend anticoagulation. However patient reports worsening frequent falls at home due to multiple sclerosis. HAS-BLED score is 3, indicating high risk for bleeding. For now will defer use of anticoagulation. Will continue to monitor tele for any atrial flutter/fibrillation. -Continue PO lasix 40 mg daily today. Closely monitor UOP, renal function and electrolytes.  -Continue empagliflozin 10 mg daily. -Continue Coreg to 6.25 mg twice daily. -GDMT optimization outpatient if BP and renal function allow.  -Continue home ASA 81 mg, atorvastatin  80 mg, plavix  75 mg, zetia 10 mg daily. -Minimally elevated troponin most consistent with demand/supply mismatch and not ACS.  -Due to newly reduced EF and LV global hypokinesis will plan for ischemic evaluation at outpatient follow-up.  This patient's plan of care was discussed and created with Dr. Custovic and she is in agreement.  Ok for discharge today from a cardiac perspective. Will arrange for follow up in clinic with Dr. Custovic in 1-2 weeks.    Signed: Creighton Doffing, PA-C  10/08/2023, 9:47 AM [facility]

## 2023-10-08 NOTE — TOC Transition Note (Signed)
 Transition of Care [facility]) - Discharge Note   Patient Details  Name: Cody Burns MRN: [id number] Date of Birth: Aug 29, 1965  [facility] CM/SW Contact:  Alexandra Ice, RN Phone Number: 10/08/2023, 2:40 PM   Clinical Narrative:     Patient discharged to [facility]. Discharge summary and orders sent to facility HUB. Spoke with Lydia at [facility], and patient #3 on list. Patient going to room 508, number for report [phone]. Provided information to bedside nurse. EMS packet printed to nurse station.    Final next level of care: Skilled Nursing Facility Barriers to Discharge: Barriers Resolved   Patient Goals and CMS Choice Patient states their goals for this hospitalization and ongoing recovery are:: get better CMS Medicare.gov Compare Post Acute Care list provided to:: Patient Choice offered to / list presented to : Patient      Discharge Placement                Patient to be transferred to facility by: [facility] Name of family member notified: Joelyn Music Patient and family notified of of transfer: 10/08/23  Discharge Plan and Services Additional resources added to the After Visit Summary for     Discharge Planning Services: CM Consult Post Acute Care Choice: Skilled Nursing Facility            DME Agency: NA       HH Arranged: NA          Social Drivers of Health (SDOH) Interventions SDOH Screenings   Food Insecurity: No Food Insecurity (10/04/2023)  Housing: Low Risk  (10/04/2023)  Transportation Needs: No Transportation Needs (10/04/2023)  Utilities: Not At Risk (10/04/2023)  Social Connections: Socially Isolated (10/04/2023)  Tobacco Use: Medium Risk (10/04/2023)     Readmission Risk Interventions     No data to display

## 2023-10-08 NOTE — Discharge Summary (Signed)
 [facility] Discharge Summary   Patient: Cody Burns ZOX:[id number]  PCP: [facility]  Date of admission: 10/04/2023   Date of discharge:  10/08/2023     Discharge Diagnoses:  Principal Problem:   Acute on chronic diastolic (congestive) heart failure (HCC) Active Problems:   Fall   Acute hypoxic respiratory failure (HCC)   Admitted From: Home Disposition:  SNF   Recommendations for Outpatient Follow-up:  F/u with PCP, need to be seen by an MD in 1-2 days  F/u with cardio in 1-2 weeks Follow up LABS/TEST: BMP in 1 week to check renal functions and electrolytes   Contact information for follow-up providers     Custovic, Sabina, DO. Go in 1 week(s).   Specialty: Cardiology Contact information: [address] [place] [address] [phone]              Contact information for after-discharge care     Destination     [facility]erred [facility] .   Service: Skilled Nursing Contact information: [address] [place]  [address] [phone]                    Diet recommendation: Cardiac diet, fluid restriction 1.5 L/day  Activity: The patient is advised to gradually reintroduce usual activities, as tolerated  Discharge Condition: stable  Code Status: Full code   History of present illness: As per the H and P dictated on admission Hospital Course:  Cody Burns is a 58 y.o. male with medical history significant for CVA, hypertension, multiple sclerosis, chronic diastolic HF, hypothyroidism, anxiety, allergies, alcohol use disorder, orthostatic hypotension and HLD who presented to the ED after a fall. Patient reports he has a history of falls due to his MS but over the last 1 to 2 months, he has had more falls. He normally ambulates with a walker. About a week and a half ago, he noticed some shaking in his left leg which moved to his  right leg he suddenly fell on his face sustaining a forehead hematoma and facial bruising. Per brother, patient was advised to call EMS but he did not.  Since then, he has had some difficulty breathing.  He has also noticed swelling in his lower extremities. Today, while he was walking, he started having another episode of leg shaking and eventually fell again but this time on his back, hitting his head on the floor. He denies any headaches, vision changes, dizziness, chest pain, nausea, vomiting but continues to endorse leg weakness, dizzy on exertion and shortness of breath.   ED Course: Initial vitals show temp 98.1, RR 18, HR 109, BP 86/65, improved to 125/97, SpO2 100% on room air, desatted to 88% and placed on 2 L [place].  Labs significant for normal white count, anemia with Hgb 11.0, BNP 506, normal kidney function and LFTs, normal TSH, mag and Phos. CXR showed cardiomegaly with mild pulmonary vascular congestion. CT head shows frontal scalp hematoma but no acute intracranial abnormalities or skull fracture.  Patient received IM Toradol  30 mg x 1 and nebulized treatment. TRH was consulted for admission.   Assessment and Plan:   # Acute on chronic diastolic HF # Acute hypoxic respiratory failure Last TTE 10/13/2022 showed EF 50-55%, moderate LVH, G1DD, mild to moderate MVR Patient presented with progressive shortness of breath and dizziness on exertion over the last 2 weeks. Resolved after IV lasix CXR shows pulmonary edema, BNP  elevated to 500 S/p IV Lasix 40 mg BID, switched to Lasix 40 mg p.o. daily on 5/15 5/14 started empagliflozin 10 mg for GDMT optimization 5/15 increased Coreg 6.25 mg p.o. BID due to Pac and A. Flutter, no DOAC due to risk of fall and bleeding TTE: EF 40-45%, LV global hypokinesis, LV mildly decreased function, grade 1 diastolic dysfunction, LA moderately dilated, moderate to severe MR, mild to moderate TR.  AS PER CARDIO: EF newly reduced compared to prior echo on 09/2022.  Plan to optimize GDMT. Patient without chest pain. Plan for ischemic evaluation at outpatient follow-up.    # Frequent falls:  Presented after 2 falls in the last 2 weeks, sustained forehead hematoma from previous fall CT head shows frontal scalp hematoma but no acute intracranial abnormality. S/p Ice pack to hematoma.  PT/OT eval done, recommended SNF placement Vitamin B12 deficiency, Vitamin D  level within normal range Continue fall precautions   # Multiple sclerosis: Has had recurrent falls due to this. Continued Lamictal , Trileptal , Lyrica, Seroquel  and as needed tizanidine  PT evaluation as above. # Alcohol use disorder: Reports drinking 1-2 glasses of jim beam liquor a day, Continue folic acid . S/p CIWA, no withdrawal symptoms   # HTN # Orthostatic hypotension On 5/13 Orthostatic vitals within normal range Started Coreg, Lasix, Jardiance as per cardiology as above. Monitor vitals and titrate medications accordingly.   # HLD # Hx of CVA # CAD s/p stenting: Continue aspirin , Plavix , atorvastatin  and Zetia  # Anxiety: Continue bupropion  and as needed hydroxyzine   # Vitamin B12 deficiency: Vitamin B12 level 138, goal >400. started vitamin B12 1000 mcg IM injection daily during hospital stay, followed by oral supplement.  Follow-up PCP to repeat vitamin B12 level after 3 to 6 months.   # Iron  deficiency, transferrin saturation 11%, started oral iron  supplement with vitamin C.  Follow-up with PCP to repeat iron  profile after 3 to 6 months   # Obesity class I Body mass index is 31.52 kg/m.  Nutrition Interventions:   - Patient was instructed, not to drive, operate heavy machinery, perform activities at heights, swimming or participation in water activities or provide baby sitting services while on Pain, Sleep and Anxiety Medications; until his outpatient Physician has advised to do so again.  - Also recommended to not to take more than prescribed Pain, Sleep and Anxiety  Medications.  Patient was seen by physical therapy, who recommended Therapy, SNF placement, which was arranged. On the day of the discharge the patient's vitals were stable, and no other acute medical condition were reported by patient. the patient was felt safe to be discharge at SNF with Therapy.  Consultants: Cardiology Procedures: None  Discharge Exam: General: Appear in no distress, no Rash; Oral Mucosa Clear, moist. Cardiovascular: Irregular rhythm, S1-S2 audible Respiratory: normal respiratory effort, Bilateral Air entry present and no Crackles, no wheezes Abdomen: Bowel Sound present, Soft and no tenderness, no hernia Extremities: no Pedal edema, no calf tenderness Neurology: alert and oriented to time, place, and person affect appropriate.  Filed Weights   10/06/23 0458 10/07/23 0425 10/08/23 0408  Weight: 103.1 kg 103.1 kg 102.5 kg   Vitals:   10/08/23 0822 10/08/23 0848  BP: 102/77 104/76  Pulse: (!) 103 (!) 59  Resp: 17 16  Temp:  (!) 97.5 F (36.4 C)  SpO2: 99% 98%    DISCHARGE MEDICATION: Allergies as of 10/08/2023       Reactions   Hydrochlorothiazide-triamterene Other (See Comments), Palpitations   High blood  pressure   Metoprolol  Other (See Comments), Palpitations   Exacerbates MS, High blood pressure   Penicillins Other (See Comments)   Sores in mouth Has patient had a PCN reaction causing immediate rash, facial/tongue/throat swelling, SOB or lightheadedness with hypotension: No Has patient had a PCN reaction causing severe rash involving mucus membranes or skin necrosis: No Has patient had a PCN reaction that required hospitalization: No Has patient had a PCN reaction occurring within the last 10 years: No If all of the above answers are "NO", then may proceed with Cephalosporin use. MOUTH SORES   Aripiprazole Other (See Comments)   WORSENED SLEEP WALKING        Medication List     STOP taking these medications    levothyroxine  25 MCG  tablet Commonly known as: SYNTHROID        TAKE these medications    Alirocumab 75 MG/ML Soaj Inject 75 mg into the skin every 14 (fourteen) days.   ascorbic acid 500 MG tablet Commonly known as: VITAMIN C Take 1 tablet (500 mg total) by mouth daily. Start taking on: Oct 09, 2023   aspirin  EC 81 MG tablet Take 81 mg by mouth daily.   atorvastatin  80 MG tablet Commonly known as: LIPITOR Take 80 mg by mouth at bedtime.   buPROPion  300 MG 24 hr tablet Commonly known as: WELLBUTRIN  XL Take 300 mg by mouth daily.   carvedilol 6.25 MG tablet Commonly known as: COREG Take 1 tablet (6.25 mg total) by mouth 2 (two) times daily with a meal.   clopidogrel  75 MG tablet Commonly known as: PLAVIX  Take 75 mg by mouth daily.   cyanocobalamin  1000 MCG tablet Take 1 tablet (1,000 mcg total) by mouth daily. Start taking on: Oct 12, 2023   docusate sodium  50 MG capsule Commonly known as: COLACE Take 50 mg by mouth 2 (two) times daily.   empagliflozin 10 MG Tabs tablet Commonly known as: JARDIANCE Take 1 tablet (10 mg total) by mouth daily. Start taking on: Oct 09, 2023   escitalopram  20 MG tablet Commonly known as: LEXAPRO  Take 20 mg by mouth daily.   ezetimibe 10 MG tablet Commonly known as: ZETIA Take 10 mg by mouth daily.   folic acid  400 MCG tablet Commonly known as: FOLVITE  Take 400 mcg by mouth daily.   furosemide 40 MG tablet Commonly known as: LASIX Take 1 tablet (40 mg total) by mouth daily. Start taking on: Oct 09, 2023   hydrOXYzine 10 MG tablet Commonly known as: ATARAX Take 20 mg by mouth once as needed for anxiety.   iron  polysaccharides 150 MG capsule Commonly known as: NIFEREX Take 1 capsule (150 mg total) by mouth daily. Start taking on: Oct 09, 2023   lamoTRIgine  25 MG tablet Commonly known as: LAMICTAL  Take 25 mg by mouth daily.   montelukast  10 MG tablet Commonly known as: SINGULAIR  Take 10 mg by mouth daily.   OXcarbazepine  150 MG  tablet Commonly known as: TRILEPTAL  Take 1 tablet by mouth 2 (two) times daily.   pregabalin 150 MG capsule Commonly known as: LYRICA Take 1 capsule (150 mg total) by mouth 3 (three) times daily.   QUEtiapine  100 MG tablet Commonly known as: SEROQUEL  Take 100 mg by mouth at bedtime.   senna-docusate 8.6-50 MG tablet Commonly known as: Senokot-S Take 2 tablets by mouth 2 (two) times daily as needed.   tiZANidine  4 MG tablet Commonly known as: ZANAFLEX  Take 4 mg by mouth every 8 (eight) hours as  needed for muscle spasms.       Allergies  Allergen Reactions   Hydrochlorothiazide-Triamterene Other (See Comments) and Palpitations    High blood pressure   Metoprolol  Other (See Comments) and Palpitations    Exacerbates MS, High blood pressure   Penicillins Other (See Comments)    Sores in mouth  Has patient had a PCN reaction causing immediate rash, facial/tongue/throat swelling, SOB or lightheadedness with hypotension: No  Has patient had a PCN reaction causing severe rash involving mucus membranes or skin necrosis: No  Has patient had a PCN reaction that required hospitalization: No  Has patient had a PCN reaction occurring within the last 10 years: No  If all of the above answers are "NO", then may proceed with Cephalosporin use.  MOUTH SORES   Aripiprazole Other (See Comments)    WORSENED SLEEP WALKING   Discharge Instructions     Call MD for:   Complete by: As directed    Palpitations and Edema   Call MD for:  difficulty breathing, headache or visual disturbances   Complete by: As directed    Call MD for:  extreme fatigue   Complete by: As directed    Call MD for:  persistant dizziness or light-headedness   Complete by: As directed    Diet - low sodium heart healthy   Complete by: As directed    Discharge instructions   Complete by: As directed    F/u with PCP, need to be seen by an MD in 1-2 days  F/u with cardio in 1-2 weeks   Increase activity slowly    Complete by: As directed        The results of significant diagnostics from this hospitalization (including imaging, microbiology, ancillary and laboratory) are listed below for reference.    Significant Diagnostic Studies: CT HEAD WO CONTRAST ( ) Result Date: 10/04/2023 CLINICAL DATA:  Head trauma, coagulopathy (Age 86-64y) Head trauma, skull fracture or hematoma (Age 9-64y) EXAM: CT HEAD WITHOUT CONTRAST TECHNIQUE: Contiguous axial images were obtained from the base of the skull through the vertex without intravenous contrast. RADIATION DOSE REDUCTION: This exam was performed according to the departmental dose-optimization program which includes automated exposure control, adjustment of the mA and/or kV according to patient size and/or use of iterative reconstruction technique. COMPARISON:  Head CT 10/16/2022 FINDINGS: Brain: No acute intracranial hemorrhage. No subdural or extra-axial collection. Unchanged atrophy, advanced for age. Stable periventricular and deep white matter hypodensities. Unchanged lacunar infarct in the left basal ganglia. Vascular: Atherosclerosis of skullbase vasculature without hyperdense vessel or abnormal calcification. Skull: Left-sided craniotomy.  No skull fracture. Sinuses/Orbits: No acute finding. Other: Frontal scalp hematoma to the left of midline. IMPRESSION: 1. Frontal scalp hematoma to the left of midline. No acute intracranial abnormality. No skull fracture. 2. Unchanged atrophy, remote left basal gangliar lacunar infarct, and periventricular white matter change. Electronically Signed   By: Chadwick Colonel M.D.   On: 10/04/2023 16:50   DG Chest Port 1 View Result Date: 10/04/2023 CLINICAL DATA:  Shortness of breath EXAM: PORTABLE CHEST 1 VIEW COMPARISON:  Chest radiograph 10/12/2022 FINDINGS: Cardiomegaly. Status post median sternotomy. Pulmonary vascular redistribution and mild interstitial opacities. No pleural effusion or pneumothorax. IMPRESSION:  Cardiomegaly with mild edema. Electronically Signed   By: Jone Neither M.D.   On: 10/04/2023 14:38   DG Thoracic Spine 2 View Result Date: 10/04/2023 CLINICAL DATA:  Status post fall EXAM: THORACIC SPINE 2 VIEWS COMPARISON:  None Available. 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 Electronically Signed   By: Fredrich Jefferson M.D.   On: 10/04/2023 14:02    Microbiology: No results found for this or any previous visit (from the past 240 hours).   Labs: CBC: Recent Labs  Lab 10/04/23 1105 10/06/23 0238 10/07/23 0324 10/08/23 0515  WBC 6.9 6.3 8.8 7.3  NEUTROABS 4.4  --   --   --   HGB 11.0* 11.7* 14.2 14.3  HCT 34.7* 35.7* 44.2 42.4  MCV 93.3 87.5 89.5 87.1  PLT 273 333 422* 387   Basic Metabolic Panel: Recent Labs  Lab 10/04/23 1154 10/04/23 1536 10/06/23 0238 10/07/23 0324 10/08/23 0515  NA 143  --  138 141 137  K 4.2  --  3.5 3.7 3.6  CL 112*  --  105 102 102  CO2 24  --  24 27 24   GLUCOSE 81  --  89 87 92  BUN 13  --  16 20 23*  CREATININE 1.20  --  1.19 1.36* 1.25*  CALCIUM  8.7*  --  9.0 10.2 9.6  MG  --  2.3 2.1 2.6* 2.5*  PHOS  --  4.3 3.8 4.2 4.0   Liver Function Tests: Recent Labs  Lab 10/04/23 1154  AST 30  ALT 17  ALKPHOS 76  BILITOT 1.3*  PROT 6.3*  ALBUMIN 3.4*   No results for input(s): "LIPASE", "AMYLASE" in the last 168 hours. No results for input(s): "AMMONIA" in the last 168 hours. Cardiac Enzymes: No results for input(s): "CKTOTAL", "CKMB", "CKMBINDEX", "TROPONINI" in the last 168 hours. BNP (last 3 results) Recent Labs    10/04/23 1105  BNP 506.4*   CBG: No results for input(s): "GLUCAP" in the last 168 hours.  Time spent: 35 minutes  Signed:  Althia Atlas  [facility] 10/08/2023 12:23 PM

## 2023-10-09 ENCOUNTER — Other Ambulatory Visit: Payer: Self-pay | Admitting: Student

## 2023-10-09 MED ORDER — PREGABALIN 150 MG PO CAPS: 150.0000 mg | ORAL_CAPSULE | Freq: Three times a day (TID) | ORAL | 0 refills | Status: DC

## 2023-10-09 MED ORDER — PREGABALIN 150 MG PO CAPS: 150.0000 mg | ORAL_CAPSULE | Freq: Three times a day (TID) | ORAL | 0 refills | Status: AC

## 2023-10-09 NOTE — Progress Notes (Deleted)
 {  Select_TRH_Note:[id number]}

## 2023-10-11 DIAGNOSIS — I2510 Atherosclerotic heart disease of native coronary artery without angina pectoris: Secondary | ICD-10-CM | POA: Diagnosis not present

## 2023-10-11 DIAGNOSIS — F411 Generalized anxiety disorder: Secondary | ICD-10-CM | POA: Diagnosis not present

## 2023-10-11 DIAGNOSIS — E785 Hyperlipidemia, unspecified: Secondary | ICD-10-CM | POA: Diagnosis not present

## 2023-10-11 DIAGNOSIS — Z9181 History of falling: Secondary | ICD-10-CM | POA: Diagnosis not present

## 2023-10-11 DIAGNOSIS — I110 Hypertensive heart disease with heart failure: Secondary | ICD-10-CM | POA: Diagnosis not present

## 2023-10-11 DIAGNOSIS — I6930 Unspecified sequelae of cerebral infarction: Secondary | ICD-10-CM | POA: Diagnosis not present

## 2023-10-11 DIAGNOSIS — I951 Orthostatic hypotension: Secondary | ICD-10-CM | POA: Diagnosis not present

## 2023-10-11 DIAGNOSIS — D509 Iron deficiency anemia, unspecified: Secondary | ICD-10-CM | POA: Diagnosis not present

## 2023-10-11 DIAGNOSIS — D519 Vitamin B12 deficiency anemia, unspecified: Secondary | ICD-10-CM | POA: Diagnosis not present

## 2023-10-11 DIAGNOSIS — G35 Multiple sclerosis: Secondary | ICD-10-CM | POA: Diagnosis not present

## 2023-10-11 DIAGNOSIS — F1090 Alcohol use, unspecified, uncomplicated: Secondary | ICD-10-CM | POA: Diagnosis not present

## 2023-10-11 DIAGNOSIS — I5033 Acute on chronic diastolic (congestive) heart failure: Secondary | ICD-10-CM | POA: Diagnosis not present

## 2023-10-13 DIAGNOSIS — E785 Hyperlipidemia, unspecified: Secondary | ICD-10-CM | POA: Diagnosis not present

## 2023-10-13 DIAGNOSIS — F411 Generalized anxiety disorder: Secondary | ICD-10-CM | POA: Diagnosis not present

## 2023-10-13 DIAGNOSIS — D519 Vitamin B12 deficiency anemia, unspecified: Secondary | ICD-10-CM | POA: Diagnosis not present

## 2023-10-13 DIAGNOSIS — I6930 Unspecified sequelae of cerebral infarction: Secondary | ICD-10-CM | POA: Diagnosis not present

## 2023-10-13 DIAGNOSIS — D509 Iron deficiency anemia, unspecified: Secondary | ICD-10-CM | POA: Diagnosis not present

## 2023-10-13 DIAGNOSIS — I110 Hypertensive heart disease with heart failure: Secondary | ICD-10-CM | POA: Diagnosis not present

## 2023-10-13 DIAGNOSIS — I2510 Atherosclerotic heart disease of native coronary artery without angina pectoris: Secondary | ICD-10-CM | POA: Diagnosis not present

## 2023-10-13 DIAGNOSIS — I5033 Acute on chronic diastolic (congestive) heart failure: Secondary | ICD-10-CM | POA: Diagnosis not present

## 2023-10-13 DIAGNOSIS — I951 Orthostatic hypotension: Secondary | ICD-10-CM | POA: Diagnosis not present

## 2023-10-13 DIAGNOSIS — Z9181 History of falling: Secondary | ICD-10-CM | POA: Diagnosis not present

## 2023-10-13 DIAGNOSIS — G35 Multiple sclerosis: Secondary | ICD-10-CM | POA: Diagnosis not present

## 2023-10-15 DIAGNOSIS — G35 Multiple sclerosis: Secondary | ICD-10-CM | POA: Diagnosis not present

## 2023-10-15 DIAGNOSIS — I6930 Unspecified sequelae of cerebral infarction: Secondary | ICD-10-CM | POA: Diagnosis not present

## 2023-10-15 DIAGNOSIS — F411 Generalized anxiety disorder: Secondary | ICD-10-CM | POA: Diagnosis not present

## 2023-10-15 DIAGNOSIS — D519 Vitamin B12 deficiency anemia, unspecified: Secondary | ICD-10-CM | POA: Diagnosis not present

## 2023-10-15 DIAGNOSIS — I5033 Acute on chronic diastolic (congestive) heart failure: Secondary | ICD-10-CM | POA: Diagnosis not present

## 2023-10-15 DIAGNOSIS — I2510 Atherosclerotic heart disease of native coronary artery without angina pectoris: Secondary | ICD-10-CM | POA: Diagnosis not present

## 2023-10-15 DIAGNOSIS — Z9181 History of falling: Secondary | ICD-10-CM | POA: Diagnosis not present

## 2023-10-15 DIAGNOSIS — E785 Hyperlipidemia, unspecified: Secondary | ICD-10-CM | POA: Diagnosis not present

## 2023-10-15 DIAGNOSIS — D509 Iron deficiency anemia, unspecified: Secondary | ICD-10-CM | POA: Diagnosis not present

## 2023-10-15 DIAGNOSIS — I110 Hypertensive heart disease with heart failure: Secondary | ICD-10-CM | POA: Diagnosis not present

## 2023-10-19 DIAGNOSIS — D509 Iron deficiency anemia, unspecified: Secondary | ICD-10-CM | POA: Diagnosis not present

## 2023-10-19 DIAGNOSIS — I2510 Atherosclerotic heart disease of native coronary artery without angina pectoris: Secondary | ICD-10-CM | POA: Diagnosis not present

## 2023-10-19 DIAGNOSIS — G35 Multiple sclerosis: Secondary | ICD-10-CM | POA: Diagnosis not present

## 2023-10-19 DIAGNOSIS — Z7901 Long term (current) use of anticoagulants: Secondary | ICD-10-CM | POA: Diagnosis not present

## 2023-10-19 DIAGNOSIS — D519 Vitamin B12 deficiency anemia, unspecified: Secondary | ICD-10-CM | POA: Diagnosis not present

## 2023-10-19 DIAGNOSIS — F1090 Alcohol use, unspecified, uncomplicated: Secondary | ICD-10-CM | POA: Diagnosis not present

## 2023-10-19 DIAGNOSIS — I110 Hypertensive heart disease with heart failure: Secondary | ICD-10-CM | POA: Diagnosis not present

## 2023-10-19 DIAGNOSIS — I951 Orthostatic hypotension: Secondary | ICD-10-CM | POA: Diagnosis not present

## 2023-10-19 DIAGNOSIS — I5033 Acute on chronic diastolic (congestive) heart failure: Secondary | ICD-10-CM | POA: Diagnosis not present

## 2023-10-20 DIAGNOSIS — E785 Hyperlipidemia, unspecified: Secondary | ICD-10-CM | POA: Diagnosis not present

## 2023-10-20 DIAGNOSIS — D509 Iron deficiency anemia, unspecified: Secondary | ICD-10-CM | POA: Diagnosis not present

## 2023-10-20 DIAGNOSIS — Z9181 History of falling: Secondary | ICD-10-CM | POA: Diagnosis not present

## 2023-10-20 DIAGNOSIS — D519 Vitamin B12 deficiency anemia, unspecified: Secondary | ICD-10-CM | POA: Diagnosis not present

## 2023-10-20 DIAGNOSIS — I2510 Atherosclerotic heart disease of native coronary artery without angina pectoris: Secondary | ICD-10-CM | POA: Diagnosis not present

## 2023-10-20 DIAGNOSIS — I5033 Acute on chronic diastolic (congestive) heart failure: Secondary | ICD-10-CM | POA: Diagnosis not present

## 2023-10-20 DIAGNOSIS — Z7901 Long term (current) use of anticoagulants: Secondary | ICD-10-CM | POA: Diagnosis not present

## 2023-10-20 DIAGNOSIS — G35 Multiple sclerosis: Secondary | ICD-10-CM | POA: Diagnosis not present

## 2023-10-20 DIAGNOSIS — I110 Hypertensive heart disease with heart failure: Secondary | ICD-10-CM | POA: Diagnosis not present

## 2023-10-20 DIAGNOSIS — F411 Generalized anxiety disorder: Secondary | ICD-10-CM | POA: Diagnosis not present

## 2023-10-20 DIAGNOSIS — I6930 Unspecified sequelae of cerebral infarction: Secondary | ICD-10-CM | POA: Diagnosis not present

## 2023-10-20 LAB — ECHOCARDIOGRAM COMPLETE
Height: 71 in
Weight: 3844.82 [oz_av]

## 2023-10-22 DIAGNOSIS — G35 Multiple sclerosis: Secondary | ICD-10-CM | POA: Diagnosis not present

## 2023-10-22 DIAGNOSIS — I951 Orthostatic hypotension: Secondary | ICD-10-CM | POA: Diagnosis not present

## 2023-10-22 DIAGNOSIS — I110 Hypertensive heart disease with heart failure: Secondary | ICD-10-CM | POA: Diagnosis not present

## 2023-10-22 DIAGNOSIS — I5033 Acute on chronic diastolic (congestive) heart failure: Secondary | ICD-10-CM | POA: Diagnosis not present

## 2023-10-22 DIAGNOSIS — D519 Vitamin B12 deficiency anemia, unspecified: Secondary | ICD-10-CM | POA: Diagnosis not present

## 2023-10-22 DIAGNOSIS — E785 Hyperlipidemia, unspecified: Secondary | ICD-10-CM | POA: Diagnosis not present

## 2023-10-22 DIAGNOSIS — I6930 Unspecified sequelae of cerebral infarction: Secondary | ICD-10-CM | POA: Diagnosis not present

## 2023-10-22 DIAGNOSIS — I2510 Atherosclerotic heart disease of native coronary artery without angina pectoris: Secondary | ICD-10-CM | POA: Diagnosis not present

## 2023-10-22 DIAGNOSIS — Z9181 History of falling: Secondary | ICD-10-CM | POA: Diagnosis not present

## 2023-10-22 DIAGNOSIS — D509 Iron deficiency anemia, unspecified: Secondary | ICD-10-CM | POA: Diagnosis not present

## 2023-10-22 DIAGNOSIS — Z7901 Long term (current) use of anticoagulants: Secondary | ICD-10-CM | POA: Diagnosis not present

## 2023-10-22 DIAGNOSIS — F411 Generalized anxiety disorder: Secondary | ICD-10-CM | POA: Diagnosis not present

## 2023-11-18 ENCOUNTER — Other Ambulatory Visit: Payer: Self-pay

## 2023-11-18 ENCOUNTER — Inpatient Hospital Stay
Admission: EM | Admit: 2023-11-18 | Discharge: 2023-11-20 | DRG: 280 | Disposition: A | Discharge status: Home / Self Care | Attending: Internal Medicine | Admitting: Internal Medicine

## 2023-11-18 ENCOUNTER — Emergency Department

## 2023-11-18 ENCOUNTER — Encounter: Payer: Self-pay | Admitting: Internal Medicine

## 2023-11-18 DIAGNOSIS — I4891 Unspecified atrial fibrillation: Secondary | ICD-10-CM | POA: Diagnosis present

## 2023-11-18 DIAGNOSIS — I429 Cardiomyopathy, unspecified: Secondary | ICD-10-CM | POA: Diagnosis present

## 2023-11-18 DIAGNOSIS — I2510 Atherosclerotic heart disease of native coronary artery without angina pectoris: Secondary | ICD-10-CM | POA: Diagnosis not present

## 2023-11-18 DIAGNOSIS — I10 Essential (primary) hypertension: Secondary | ICD-10-CM | POA: Diagnosis not present

## 2023-11-18 DIAGNOSIS — Z7984 Long term (current) use of oral hypoglycemic drugs: Secondary | ICD-10-CM | POA: Diagnosis not present

## 2023-11-18 DIAGNOSIS — I509 Heart failure, unspecified: Secondary | ICD-10-CM | POA: Diagnosis not present

## 2023-11-18 DIAGNOSIS — Z604 Social exclusion and rejection: Secondary | ICD-10-CM | POA: Diagnosis present

## 2023-11-18 DIAGNOSIS — Z880 Allergy status to penicillin: Secondary | ICD-10-CM

## 2023-11-18 DIAGNOSIS — G4733 Obstructive sleep apnea (adult) (pediatric): Secondary | ICD-10-CM | POA: Diagnosis present

## 2023-11-18 DIAGNOSIS — E876 Hypokalemia: Secondary | ICD-10-CM | POA: Diagnosis present

## 2023-11-18 DIAGNOSIS — R5381 Other malaise: Secondary | ICD-10-CM | POA: Diagnosis present

## 2023-11-18 DIAGNOSIS — Z87891 Personal history of nicotine dependence: Secondary | ICD-10-CM | POA: Diagnosis not present

## 2023-11-18 DIAGNOSIS — Z8673 Personal history of transient ischemic attack (TIA), and cerebral infarction without residual deficits: Secondary | ICD-10-CM

## 2023-11-18 DIAGNOSIS — I5021 Acute systolic (congestive) heart failure: Secondary | ICD-10-CM | POA: Diagnosis not present

## 2023-11-18 DIAGNOSIS — I110 Hypertensive heart disease with heart failure: Secondary | ICD-10-CM | POA: Diagnosis present

## 2023-11-18 DIAGNOSIS — I483 Typical atrial flutter: Secondary | ICD-10-CM | POA: Diagnosis not present

## 2023-11-18 DIAGNOSIS — Z7982 Long term (current) use of aspirin: Secondary | ICD-10-CM | POA: Diagnosis not present

## 2023-11-18 DIAGNOSIS — R0602 Shortness of breath: Secondary | ICD-10-CM | POA: Diagnosis not present

## 2023-11-18 DIAGNOSIS — R0689 Other abnormalities of breathing: Secondary | ICD-10-CM | POA: Diagnosis not present

## 2023-11-18 DIAGNOSIS — J189 Pneumonia, unspecified organism: Secondary | ICD-10-CM | POA: Diagnosis not present

## 2023-11-18 DIAGNOSIS — I5033 Acute on chronic diastolic (congestive) heart failure: Secondary | ICD-10-CM | POA: Diagnosis not present

## 2023-11-18 DIAGNOSIS — F1010 Alcohol abuse, uncomplicated: Secondary | ICD-10-CM | POA: Diagnosis present

## 2023-11-18 DIAGNOSIS — Z888 Allergy status to other drugs, medicaments and biological substances status: Secondary | ICD-10-CM

## 2023-11-18 DIAGNOSIS — Z79899 Other long term (current) drug therapy: Secondary | ICD-10-CM | POA: Diagnosis not present

## 2023-11-18 DIAGNOSIS — A419 Sepsis, unspecified organism: Secondary | ICD-10-CM

## 2023-11-18 DIAGNOSIS — I5043 Acute on chronic combined systolic (congestive) and diastolic (congestive) heart failure: Secondary | ICD-10-CM

## 2023-11-18 DIAGNOSIS — Z951 Presence of aortocoronary bypass graft: Secondary | ICD-10-CM

## 2023-11-18 DIAGNOSIS — I252 Old myocardial infarction: Secondary | ICD-10-CM

## 2023-11-18 DIAGNOSIS — I21A1 Myocardial infarction type 2: Secondary | ICD-10-CM | POA: Diagnosis present

## 2023-11-18 DIAGNOSIS — I452 Bifascicular block: Secondary | ICD-10-CM | POA: Diagnosis present

## 2023-11-18 DIAGNOSIS — E039 Hypothyroidism, unspecified: Secondary | ICD-10-CM | POA: Diagnosis present

## 2023-11-18 DIAGNOSIS — J9601 Acute respiratory failure with hypoxia: Secondary | ICD-10-CM | POA: Diagnosis not present

## 2023-11-18 DIAGNOSIS — I25810 Atherosclerosis of coronary artery bypass graft(s) without angina pectoris: Secondary | ICD-10-CM | POA: Diagnosis not present

## 2023-11-18 DIAGNOSIS — I493 Ventricular premature depolarization: Secondary | ICD-10-CM | POA: Diagnosis not present

## 2023-11-18 DIAGNOSIS — R569 Unspecified convulsions: Secondary | ICD-10-CM | POA: Diagnosis present

## 2023-11-18 DIAGNOSIS — I499 Cardiac arrhythmia, unspecified: Secondary | ICD-10-CM | POA: Diagnosis not present

## 2023-11-18 DIAGNOSIS — I214 Non-ST elevation (NSTEMI) myocardial infarction: Secondary | ICD-10-CM | POA: Diagnosis not present

## 2023-11-18 DIAGNOSIS — Z96642 Presence of left artificial hip joint: Secondary | ICD-10-CM | POA: Diagnosis present

## 2023-11-18 DIAGNOSIS — F32A Depression, unspecified: Secondary | ICD-10-CM | POA: Diagnosis present

## 2023-11-18 DIAGNOSIS — I4819 Other persistent atrial fibrillation: Secondary | ICD-10-CM | POA: Diagnosis not present

## 2023-11-18 DIAGNOSIS — I209 Angina pectoris, unspecified: Secondary | ICD-10-CM | POA: Diagnosis not present

## 2023-11-18 DIAGNOSIS — I4892 Unspecified atrial flutter: Principal | ICD-10-CM | POA: Diagnosis present

## 2023-11-18 DIAGNOSIS — Z7902 Long term (current) use of antithrombotics/antiplatelets: Secondary | ICD-10-CM | POA: Diagnosis not present

## 2023-11-18 DIAGNOSIS — I5022 Chronic systolic (congestive) heart failure: Secondary | ICD-10-CM | POA: Diagnosis not present

## 2023-11-18 DIAGNOSIS — R296 Repeated falls: Secondary | ICD-10-CM | POA: Diagnosis not present

## 2023-11-18 DIAGNOSIS — R000 Tachycardia, unspecified: Secondary | ICD-10-CM | POA: Diagnosis not present

## 2023-11-18 DIAGNOSIS — R6520 Severe sepsis without septic shock: Secondary | ICD-10-CM | POA: Diagnosis not present

## 2023-11-18 DIAGNOSIS — E785 Hyperlipidemia, unspecified: Secondary | ICD-10-CM | POA: Diagnosis present

## 2023-11-18 DIAGNOSIS — Z8249 Family history of ischemic heart disease and other diseases of the circulatory system: Secondary | ICD-10-CM

## 2023-11-18 DIAGNOSIS — I340 Nonrheumatic mitral (valve) insufficiency: Secondary | ICD-10-CM | POA: Diagnosis present

## 2023-11-18 DIAGNOSIS — R7989 Other specified abnormal findings of blood chemistry: Secondary | ICD-10-CM

## 2023-11-18 DIAGNOSIS — G35 Multiple sclerosis: Secondary | ICD-10-CM | POA: Diagnosis not present

## 2023-11-18 DIAGNOSIS — Z955 Presence of coronary angioplasty implant and graft: Secondary | ICD-10-CM

## 2023-11-18 DIAGNOSIS — J9600 Acute respiratory failure, unspecified whether with hypoxia or hypercapnia: Secondary | ICD-10-CM | POA: Diagnosis not present

## 2023-11-18 DIAGNOSIS — Z9181 History of falling: Secondary | ICD-10-CM

## 2023-11-18 DIAGNOSIS — F419 Anxiety disorder, unspecified: Secondary | ICD-10-CM | POA: Diagnosis present

## 2023-11-18 DIAGNOSIS — R918 Other nonspecific abnormal finding of lung field: Secondary | ICD-10-CM | POA: Diagnosis not present

## 2023-11-18 DIAGNOSIS — R069 Unspecified abnormalities of breathing: Secondary | ICD-10-CM | POA: Diagnosis not present

## 2023-11-18 LAB — CBC WITH DIFFERENTIAL/PLATELET
Abs Immature Granulocytes: 0.08 10*3/uL — ABNORMAL HIGH (ref 0.00–0.07)
Basophils Absolute: 0.1 10*3/uL (ref 0.0–0.1)
Basophils Relative: 0 %
Eosinophils Absolute: 0.2 10*3/uL (ref 0.0–0.5)
Eosinophils Relative: 1 %
HCT: 43.9 % (ref 39.0–52.0)
Hemoglobin: 14.3 g/dL (ref 13.0–17.0)
Immature Granulocytes: 1 %
Lymphocytes Relative: 17 %
Lymphs Abs: 2.7 10*3/uL (ref 0.7–4.0)
MCH: 29.2 pg (ref 26.0–34.0)
MCHC: 32.6 g/dL (ref 30.0–36.0)
MCV: 89.6 fL (ref 80.0–100.0)
Monocytes Absolute: 1.1 10*3/uL — ABNORMAL HIGH (ref 0.1–1.0)
Monocytes Relative: 7 %
Neutro Abs: 11.9 10*3/uL — ABNORMAL HIGH (ref 1.7–7.7)
Neutrophils Relative %: 74 %
Platelets: 353 10*3/uL (ref 150–400)
RBC: 4.9 MIL/uL (ref 4.22–5.81)
RDW: 15.7 % — ABNORMAL HIGH (ref 11.5–15.5)
WBC: 15.9 10*3/uL — ABNORMAL HIGH (ref 4.0–10.5)
nRBC: 0 % (ref 0.0–0.2)

## 2023-11-18 LAB — LACTIC ACID, PLASMA
Lactic Acid, Venous: 1.7 mmol/L (ref 0.5–1.9)
Lactic Acid, Venous: 1.4 mmol/L (ref 0.5–1.9)
Lactic Acid, Venous: 1 mmol/L (ref 0.5–1.9)

## 2023-11-18 LAB — BASIC METABOLIC PANEL WITH GFR
Anion gap: 11 (ref 5–15)
BUN: 11 mg/dL (ref 6–20)
CO2: 23 mmol/L (ref 22–32)
Calcium: 9 mg/dL (ref 8.9–10.3)
Chloride: 105 mmol/L (ref 98–111)
Creatinine, Ser: 1.11 mg/dL (ref 0.61–1.24)
GFR, Estimated: >60 mL/min (ref >60)
Glucose, Bld: 127 mg/dL — ABNORMAL HIGH (ref 70–99)
Potassium: 3.3 mmol/L — ABNORMAL LOW (ref 3.5–5.1)
Sodium: 139 mmol/L (ref 135–145)

## 2023-11-18 LAB — TROPONIN I (HIGH SENSITIVITY)
Troponin I (High Sensitivity): 69 ng/L — ABNORMAL HIGH (ref <18)
Troponin I (High Sensitivity): 126 ng/L (ref <18)
Troponin I (High Sensitivity): 199 ng/L (ref <18)
Troponin I (High Sensitivity): 212 ng/L (ref <18)

## 2023-11-18 LAB — PROCALCITONIN: Procalcitonin: <0.1 ng/mL

## 2023-11-18 LAB — RESP PANEL BY RT-PCR (RSV, FLU A&B, COVID)  RVPGX2
Influenza A by PCR: NEGATIVE
Influenza B by PCR: NEGATIVE
Resp Syncytial Virus by PCR: NEGATIVE
SARS Coronavirus 2 by RT PCR: NEGATIVE

## 2023-11-18 LAB — APTT: aPTT: 140 s — ABNORMAL HIGH (ref 24–36)

## 2023-11-18 LAB — HIV ANTIBODY (ROUTINE TESTING W REFLEX): HIV Screen 4th Generation wRfx: NONREACTIVE

## 2023-11-18 LAB — STREP PNEUMONIAE URINARY ANTIGEN: Strep Pneumo Urinary Antigen: NEGATIVE

## 2023-11-18 LAB — PROTIME-INR
INR: 1.1 (ref 0.8–1.2)
Prothrombin Time: 14.8 s (ref 11.4–15.2)

## 2023-11-18 LAB — HEPARIN LEVEL (UNFRACTIONATED): Heparin Unfractionated: 0.63 [IU]/mL (ref 0.30–0.70)

## 2023-11-18 LAB — BRAIN NATRIURETIC PEPTIDE: B Natriuretic Peptide: 598.8 pg/mL — ABNORMAL HIGH (ref 0.0–100.0)

## 2023-11-18 MED ORDER — SPIRONOLACTONE 25 MG PO TABS
25.0000 mg | ORAL_TABLET | Freq: Every day | ORAL | Status: DC
  Administered 2023-11-18 – 2023-11-20 (×3): 25 mg via ORAL
  Filled 2023-11-18 (×3): qty 1

## 2023-11-18 MED ORDER — HEPARIN (PORCINE) 25000 UT/250ML-% IV SOLN
1500.0000 [IU]/h | INTRAVENOUS | Status: DC
  Administered 2023-11-18 – 2023-11-19 (×2): 1500 [IU]/h via INTRAVENOUS
  Filled 2023-11-18 (×2): qty 250

## 2023-11-18 MED ORDER — ALBUTEROL SULFATE (2.5 MG/3ML) 0.083% IN NEBU: 2.5000 mg | INHALATION_SOLUTION | RESPIRATORY_TRACT | Status: DC | PRN

## 2023-11-18 MED ORDER — AMIODARONE HCL IN DEXTROSE 360-4.14 MG/200ML-% IV SOLN
30.0000 mg/h | INTRAVENOUS | Status: AC
  Administered 2023-11-18 – 2023-11-19 (×2): 30 mg/h via INTRAVENOUS
  Filled 2023-11-18 (×2): qty 200

## 2023-11-18 MED ORDER — SODIUM CHLORIDE 0.9 % IV SOLN
500.0000 mg | INTRAVENOUS | Status: DC
  Administered 2023-11-19: 500 mg via INTRAVENOUS
  Filled 2023-11-18 (×2): qty 5

## 2023-11-18 MED ORDER — ACETAMINOPHEN 325 MG PO TABS
650.0000 mg | ORAL_TABLET | Freq: Four times a day (QID) | ORAL | Status: DC | PRN
  Filled 2023-11-18: qty 2

## 2023-11-18 MED ORDER — LACTATED RINGERS IV SOLN: INTRAVENOUS | Status: DC

## 2023-11-18 MED ORDER — POTASSIUM CHLORIDE CRYS ER 20 MEQ PO TBCR
40.0000 meq | EXTENDED_RELEASE_TABLET | Freq: Once | ORAL | Status: AC
  Administered 2023-11-18: 40 meq via ORAL
  Filled 2023-11-18: qty 2

## 2023-11-18 MED ORDER — AMIODARONE LOAD VIA INFUSION
150.0000 mg | Freq: Once | INTRAVENOUS | Status: AC
  Administered 2023-11-18: 150 mg via INTRAVENOUS
  Filled 2023-11-18: qty 83.34

## 2023-11-18 MED ORDER — NITROGLYCERIN IN D5W 200-5 MCG/ML-% IV SOLN
0.0000 ug/min | INTRAVENOUS | Status: DC
  Administered 2023-11-18: 5 ug/min via INTRAVENOUS
  Filled 2023-11-18: qty 250

## 2023-11-18 MED ORDER — CLOPIDOGREL BISULFATE 75 MG PO TABS
75.0000 mg | ORAL_TABLET | Freq: Every day | ORAL | Status: DC
  Administered 2023-11-18 – 2023-11-20 (×3): 75 mg via ORAL
  Filled 2023-11-18 (×3): qty 1

## 2023-11-18 MED ORDER — DIGOXIN 0.25 MG/ML IJ SOLN
0.5000 mg | Freq: Once | INTRAMUSCULAR | Status: AC
  Administered 2023-11-18: 0.5 mg via INTRAVENOUS
  Filled 2023-11-18: qty 2

## 2023-11-18 MED ORDER — EZETIMIBE 10 MG PO TABS
10.0000 mg | ORAL_TABLET | Freq: Every day | ORAL | Status: DC
  Administered 2023-11-18 – 2023-11-20 (×3): 10 mg via ORAL
  Filled 2023-11-18 (×3): qty 1

## 2023-11-18 MED ORDER — ISOSORBIDE MONONITRATE ER 30 MG PO TB24
30.0000 mg | ORAL_TABLET | Freq: Every day | ORAL | Status: DC
  Administered 2023-11-18 – 2023-11-20 (×3): 30 mg via ORAL
  Filled 2023-11-18 (×3): qty 1

## 2023-11-18 MED ORDER — LOSARTAN POTASSIUM 25 MG PO TABS
25.0000 mg | ORAL_TABLET | Freq: Every day | ORAL | Status: DC
  Administered 2023-11-18: 25 mg via ORAL
  Filled 2023-11-18: qty 1

## 2023-11-18 MED ORDER — SODIUM CHLORIDE 0.9 % IV SOLN
2.0000 g | Freq: Once | INTRAVENOUS | Status: AC
  Administered 2023-11-18: 2 g via INTRAVENOUS
  Filled 2023-11-18: qty 20

## 2023-11-18 MED ORDER — PREGABALIN 75 MG PO CAPS
150.0000 mg | ORAL_CAPSULE | Freq: Three times a day (TID) | ORAL | Status: DC
  Administered 2023-11-18 – 2023-11-20 (×6): 150 mg via ORAL
  Filled 2023-11-18 (×6): qty 2

## 2023-11-18 MED ORDER — FUROSEMIDE 10 MG/ML IJ SOLN: 60.0000 mg | Freq: Two times a day (BID) | INTRAMUSCULAR | Status: DC

## 2023-11-18 MED ORDER — AMIODARONE HCL IN DEXTROSE 360-4.14 MG/200ML-% IV SOLN
60.0000 mg/h | INTRAVENOUS | Status: AC
  Administered 2023-11-18 (×2): 60 mg/h via INTRAVENOUS
  Filled 2023-11-18 (×2): qty 200

## 2023-11-18 MED ORDER — MONTELUKAST SODIUM 10 MG PO TABS
10.0000 mg | ORAL_TABLET | Freq: Every day | ORAL | Status: DC
  Administered 2023-11-18 – 2023-11-20 (×3): 10 mg via ORAL
  Filled 2023-11-18 (×3): qty 1

## 2023-11-18 MED ORDER — LAMOTRIGINE 25 MG PO TABS
25.0000 mg | ORAL_TABLET | Freq: Every day | ORAL | Status: DC
  Administered 2023-11-18 – 2023-11-20 (×3): 25 mg via ORAL
  Filled 2023-11-18 (×3): qty 1

## 2023-11-18 MED ORDER — VITAMIN C 500 MG PO TABS
500.0000 mg | ORAL_TABLET | Freq: Every day | ORAL | Status: DC
  Administered 2023-11-18 – 2023-11-20 (×3): 500 mg via ORAL
  Filled 2023-11-18 (×3): qty 1

## 2023-11-18 MED ORDER — FUROSEMIDE 10 MG/ML IJ SOLN
40.0000 mg | Freq: Once | INTRAMUSCULAR | Status: AC
  Administered 2023-11-18: 40 mg via INTRAVENOUS
  Filled 2023-11-18: qty 4

## 2023-11-18 MED ORDER — VITAMIN B-12 1000 MCG PO TABS
1000.0000 ug | ORAL_TABLET | Freq: Every day | ORAL | Status: DC
  Administered 2023-11-19 – 2023-11-20 (×2): 1000 ug via ORAL
  Filled 2023-11-18 (×2): qty 1

## 2023-11-18 MED ORDER — LOSARTAN POTASSIUM 50 MG PO TABS: 25.0000 mg | ORAL_TABLET | Freq: Every day | ORAL | Status: DC

## 2023-11-18 MED ORDER — ONDANSETRON HCL 4 MG PO TABS: 4.0000 mg | ORAL_TABLET | Freq: Four times a day (QID) | ORAL | Status: DC | PRN

## 2023-11-18 MED ORDER — NITROGLYCERIN 0.4 MG SL SUBL
0.4000 mg | SUBLINGUAL_TABLET | Freq: Once | SUBLINGUAL | Status: AC
  Administered 2023-11-18: 0.4 mg via SUBLINGUAL
  Filled 2023-11-18: qty 1

## 2023-11-18 MED ORDER — FUROSEMIDE 10 MG/ML IJ SOLN: 40.0000 mg | Freq: Two times a day (BID) | INTRAMUSCULAR | Status: DC

## 2023-11-18 MED ORDER — FOLIC ACID 1 MG PO TABS
500.0000 ug | ORAL_TABLET | Freq: Every day | ORAL | Status: DC
  Administered 2023-11-18 – 2023-11-20 (×3): 0.5 mg via ORAL
  Filled 2023-11-18 (×3): qty 1

## 2023-11-18 MED ORDER — ONDANSETRON HCL 4 MG/2ML IJ SOLN: 4.0000 mg | Freq: Four times a day (QID) | INTRAMUSCULAR | Status: DC | PRN

## 2023-11-18 MED ORDER — HEPARIN BOLUS VIA INFUSION: 6000.0000 [IU] | Freq: Once | INTRAVENOUS | Status: DC

## 2023-11-18 MED ORDER — TIZANIDINE HCL 4 MG PO TABS: 4.0000 mg | ORAL_TABLET | Freq: Three times a day (TID) | ORAL | Status: DC | PRN

## 2023-11-18 MED ORDER — FUROSEMIDE 10 MG/ML IJ SOLN
80.0000 mg | Freq: Once | INTRAMUSCULAR | Status: AC
  Administered 2023-11-18: 80 mg via INTRAVENOUS
  Filled 2023-11-18: qty 8

## 2023-11-18 MED ORDER — SODIUM CHLORIDE 0.9 % IV SOLN: 500.0000 mg | INTRAVENOUS | Status: DC

## 2023-11-18 MED ORDER — SODIUM CHLORIDE 0.9 % IV SOLN
2.0000 g | INTRAVENOUS | Status: DC
  Administered 2023-11-19: 2 g via INTRAVENOUS
  Filled 2023-11-18: qty 20

## 2023-11-18 MED ORDER — ACETAMINOPHEN 650 MG RE SUPP: 650.0000 mg | Freq: Four times a day (QID) | RECTAL | Status: DC | PRN

## 2023-11-18 MED ORDER — OXCARBAZEPINE 150 MG PO TABS
150.0000 mg | ORAL_TABLET | Freq: Two times a day (BID) | ORAL | Status: DC
  Administered 2023-11-18 – 2023-11-20 (×5): 150 mg via ORAL
  Filled 2023-11-18 (×5): qty 1

## 2023-11-18 MED ORDER — ATORVASTATIN CALCIUM 80 MG PO TABS
80.0000 mg | ORAL_TABLET | Freq: Every day | ORAL | Status: DC
  Administered 2023-11-18 – 2023-11-19 (×2): 80 mg via ORAL
  Filled 2023-11-18 (×2): qty 1

## 2023-11-18 MED ORDER — HEPARIN BOLUS VIA INFUSION
5000.0000 [IU] | Freq: Once | INTRAVENOUS | Status: AC
  Administered 2023-11-18: 5000 [IU] via INTRAVENOUS
  Filled 2023-11-18: qty 5000

## 2023-11-18 MED ORDER — MAGNESIUM SULFATE 2 GM/50ML IV SOLN
2.0000 g | Freq: Once | INTRAVENOUS | Status: AC
  Administered 2023-11-18: 2 g via INTRAVENOUS
  Filled 2023-11-18: qty 50

## 2023-11-18 MED ORDER — CARVEDILOL 6.25 MG PO TABS: 12.5000 mg | ORAL_TABLET | Freq: Two times a day (BID) | ORAL | Status: DC

## 2023-11-18 MED ORDER — SODIUM CHLORIDE 0.9 % IV SOLN
500.0000 mg | Freq: Once | INTRAVENOUS | Status: AC
  Administered 2023-11-18: 500 mg via INTRAVENOUS
  Filled 2023-11-18: qty 5

## 2023-11-18 MED ORDER — HEPARIN SODIUM (PORCINE) 5000 UNIT/ML IJ SOLN
5000.0000 [IU] | Freq: Three times a day (TID) | INTRAMUSCULAR | Status: DC
  Administered 2023-11-18: 5000 [IU] via SUBCUTANEOUS
  Filled 2023-11-18: qty 1

## 2023-11-18 NOTE — Consult Note (Signed)
 [facility] CLINIC CARDIOLOGY CONSULT NOTE       Patient ID: Cody Burns MRN: [id number] DOB/AGE: [birth year] 58 y.o.  Admit date: 11/18/2023 Referring Physician Dr. Debby [facility], [facility] Primary Cardiologist Dr. Florencio (last seen 2019), [facility] Reason for Consultation AoC HFrEF, elevated troponins  HPI: Cody Burns is a 58 y.o. male  with a past medical history of chronic HFmrEF, CABG x4 (2015), coronary artery disease s/p DES to SVG to diagonal 1 proximal and SVG to RCA ostial (05/2017), hypertension, hyperlipidemia, hx CVA, multiple sclerosis, hypothyroidism, orthostatic hypotension, alcohol use disorder, frequent falls  who presented to the ED on 11/18/2023 for worsening shortness of breath, lower extremity edema, chest tightness.  Troponins and BNP found to be elevated.  Cardiology was consulted for further evaluation.   Patient presented to the ED with worsening shortness of breath, lower extremity edema and chest tightness. Work up in the ED notable for sodium 139, potassium 3.3, creatinine 1.11, hemoglobin 14.3, platelets 353, neutrophil count elevated 11.9.  Blood cultures collected and pending.  Chest x-ray with pulmonary vascular congestion, and possible pneumonia.  BNP elevated at 600.  Troponins elevated and trending 60 > 120 > 200.  EKG with atrial flutter rate 120s without acute ischemic changes.  Patient started on nitroglycerin  infusion and given 1 dose of IV Lasix  60 mg, IV dilt 10 mg, and started on IV antibiotics.  At the time of my evaluation this afternoon, patient was resting comfortably in ED stretcher.  We discussed patient's symptoms in further detail.  Patient states he has been having worsening shortness of breath, lower extremity edema, palpitations, chest tightness for the past 3 weeks.  Patient states he has been taking his Jardiance  and p.o. Lasix  40 mg daily at home.  Patient states he is unsure if he has been taking  his Coreg .  Review of systems complete and found to be negative unless listed above    Past Medical History:  Diagnosis Date   Blackout 2018   several episodes with unknown etiology. cause of his ankle break. awaiting neuro workup   CVA (cerebral vascular accident) (HCC)    Hypertension    Hypothyroidism    Multiple sclerosis (HCC) 1990   Myocardial infarct (HCC)    Optic neuritis    x 3. takes steriods and it resolves.   Seizures (HCC)    as a child. diagnosed with MS and once treated, seizures resolved.   Sleep apnea     Past Surgical History:  Procedure Laterality Date   CORONARY ARTERY BYPASS GRAFT  2015   CORONARY STENT INTERVENTION N/A 06/07/2017   Procedure: CORONARY STENT INTERVENTION;  Surgeon: Florencio Cara BIRCH, MD;  Location: [facility] INVASIVE CV LAB;  Service: Cardiovascular;  Laterality: N/A;   JOINT REPLACEMENT Left 2007   left hip replacement   LEFT HEART CATH AND CORS/GRAFTS ANGIOGRAPHY N/A 06/07/2017   Procedure: LEFT HEART CATH AND CORS/GRAFTS ANGIOGRAPHY;  Surgeon: Hester Wolm PARAS, MD;  Location: [facility] INVASIVE CV LAB;  Service: Cardiovascular;  Laterality: N/A;   ORIF ANKLE FRACTURE Right 09/22/2016   Procedure: OPEN REDUCTION INTERNAL FIXATION (ORIF) ANKLE FRACTURE;  Surgeon: Norleen PARAS Maltos, MD;  Location: [facility] ORS;  Service: Orthopedics;  Laterality: Right;   quadruple cardiac bypass  2015    (Not in a hospital admission)  Social History   Socioeconomic History   Marital status: Divorced    Spouse name: Not on file   Number of children: 1   Years  of education: 12   Highest education level: Not on file  Occupational History   Occupation: Disabled  Tobacco Use   Smoking status: Never   Smokeless tobacco: Former    Types: Chew   Tobacco comments:    rarely uses smokeless tobacco  Vaping Use   Vaping status: Never Used  Substance and Sexual Activity   Alcohol use: Yes    Comment: daily   Drug use: No   Sexual activity: Not Currently  Other Topics  Concern   Not on file  Social History Narrative   Lives alone   Caffeine use: Soda daily   Right handed   Social Drivers of Health   Financial Resource Strain: Not on file  Food Insecurity: No Food Insecurity (11/18/2023)   Hunger Vital Sign    Worried About Running Out of Food in the Last Year: Never true    Ran Out of Food in the Last Year: Never true  Transportation Needs: No Transportation Needs (11/18/2023)   PRAPARE - Administrator, Civil Service (Medical): No    Lack of Transportation (Non-Medical): No  Physical Activity: Not on file  Stress: Not on file  Social Connections: Socially Isolated (10/04/2023)   Social Connection and Isolation Panel    Frequency of Communication with Friends and Family: More than three times a week    Frequency of Social Gatherings with Friends and Family: More than three times a week    Attends Religious Services: Never    Database administrator or Organizations: No    Attends Banker Meetings: Never    Marital Status: Divorced  Catering manager Violence: Not At Risk (11/18/2023)   Humiliation, Afraid, Rape, and Kick questionnaire    Fear of Current or Ex-Partner: No    Emotionally Abused: No    Physically Abused: No    Sexually Abused: No    Family History  Problem Relation Age of Onset   Cancer Mother    Other Father    CAD Brother      Vitals:   11/18/23 1200 11/18/23 1215 11/18/23 1230 11/18/23 1245  BP: (!) 138/97 (!) 131/106 (!) 142/83 (!) 124/107  Pulse: (!) 128 (!) 127 (!) 125 (!) 126  Resp: (!) 23 19 (!) 23 (!) 22  Temp:      TempSrc:      SpO2: 96% 98% 95% 96%  Weight:      Height:        PHYSICAL EXAM General: Chronically ill-appearing male, well nourished, in no acute distress. HEENT: Normocephalic and atraumatic. Neck: No JVD.   Lungs: Normal respiratory effort on 4L (no O2 home requirement).  Bibasilar crackles Heart: HRR, elevated rate. Normal S1 and S2 without gallops or murmurs.   Abdomen: Non-distended appearing.  Msk: Normal strength and tone for age. Extremities: Warm and well perfused. No clubbing, cyanosis. 1+ pedal edema bilaterally.  Neuro: Alert and oriented X 3. Psych: Answers questions appropriately.   Labs: Basic Metabolic Panel: Recent Labs    11/18/23 0636  NA 139  K 3.3*  CL 105  CO2 23  GLUCOSE 127*  BUN 11  CREATININE 1.11  CALCIUM  9.0   Liver Function Tests: No results for input(s): AST, ALT, ALKPHOS, BILITOT, PROT, ALBUMIN in the last 72 hours. No results for input(s): LIPASE, AMYLASE in the last 72 hours. CBC: Recent Labs    11/18/23 0636  WBC 15.9*  NEUTROABS 11.9*  HGB 14.3  HCT 43.9  MCV 89.6  PLT 353   Cardiac Enzymes: Recent Labs    11/18/23 0636 11/18/23 0912 11/18/23 1302  TROPONINIHS 69* 126* 199*   BNP: Recent Labs    11/18/23 0640  BNP 598.8*   D-Dimer: No results for input(s): DDIMER in the last 72 hours. Hemoglobin A1C: No results for input(s): HGBA1C in the last 72 hours. Fasting Lipid Panel: No results for input(s): CHOL, HDL, LDLCALC, TRIG, CHOLHDL, LDLDIRECT in the last 72 hours. Thyroid Function Tests: No results for input(s): TSH, T4TOTAL, T3FREE, THYROIDAB in the last 72 hours.  Invalid input(s): FREET3 Anemia Panel: No results for input(s): VITAMINB12, FOLATE, FERRITIN, TIBC, IRON , RETICCTPCT in the last 72 hours.   Radiology: DG Chest Portable 1 View Result Date: 11/18/2023 CLINICAL DATA:  SOB EXAM: PORTABLE CHEST 1 VIEW COMPARISON:  10/04/2023. FINDINGS: Diffuse pulmonary interstitial prominence consistent with pulmonary edema. Alveolar process left hemithorax could represent a superimposed pneumonia. Prominent cardiac silhouette. Small pleural effusions. No pneumothorax. Median sternotomy and CABG. IMPRESSION: Findings consistent with CHF. Possible superimposed pneumonia on the left. Electronically Signed   By: Fonda Field M.D.    On: 11/18/2023 07:24    ECHO ordered  TELEMETRY reviewed by me 11/18/2023: Atrial flutter, rate 120s  EKG reviewed by me: Atrial flutter rate 126 bpm without acute ischemic changes.  Data reviewed by me 11/18/2023: last 24h vitals tele labs imaging I/O ED provider note, admission H&P.  Principal Problem:   CHF (congestive heart failure) (HCC)    ASSESSMENT AND PLAN:  Cody Burns is a 58 y.o. male  with a past medical history of chronic HFmrEF, CABG x4 (2015), coronary artery disease s/p DES to SVG to diagonal 1 proximal and SVG to RCA ostial (05/2017), hypertension, hyperlipidemia, hx CVA, multiple sclerosis, hypothyroidism, orthostatic hypotension, alcohol use disorder, frequent falls  who presented to the ED on 11/18/2023 for worsening shortness of breath, lower extremity edema, chest tightness.  Troponins and BNP found to be elevated.  Cardiology was consulted for further evaluation.   # Atrial flutter RVR # History CVA # Frequent falls Patient reports having palpitations for past 3 weeks.  EKG in ED with atrial flutter RVR, rate 126 bpm. Per telemetry remains in atrial flutter with rate 120s.  Of note, patient allergic to metoprolol . -Monitor and replenish electrolytes for a goal K >4, Mag >2  -Ordered IV Amio bolus and infusion. -Ordered IV magnesium . -CHA2DS2-VASc Score is 5. Recommend anticoagulation stroke risk reduction.  Benefits outweigh the risk. -Ordered IV heparin  gtt for stroke risk reduction. -Consider digoxin for improved rate control.  # Acute on chronic HFmrEF (Newly reduced 40-45% - 10/05/2023) # Acute hypoxic respiratory failure Patient presents with worsening shortness of breath and lower extremity edema. Chest x-ray with pulmonary vascular congestion, and possible pneumonia.  BNP elevated at 600. -Echo ordered.  Further recommendations pending. -Ordered IV Lasix  80 mg.  Monitor renal function and UOP. -Plan to start IV Lasix  40 mg twice daily tomorrow. (Home  dose: PO lasix  40 mg daily) -Ordered spironolactone 25 mg daily. -Ordered losartan 25 mg daily. -Hold home Coreg  for now until patient closer to euvolemia. -Consider resuming home empagliflozin  10 mg tomorrow.  # NSTEMI type 1 vs type 2 # History of CABG x 4 (2015) #Coronary artery disease s/p stents x2 (05/2017) # Hypertension # Hyperlipidemia Patient with significant cardiac history, presents with worsening shortness of breath, lower extremity edema, chest tightness.  Currently denies any chest pain.  EKG without acute ischemic changes.  Troponins elevated and trending 60 >  120 > 200.   -Ordered Imdur 30 mg. -Continue atorvastatin  80 mg, Zetia  10 mg daily. -Continue Plavix  75 mg daily. -Continue losartan as stated above. -Beta-blocker stated above.  # Pneumonia CXR with pulmonary vascular congestion and possible PNA. WBC elevated at 15.9 upon admission. On 4L with no home O2 requirement.  -IV antibiotics management per primary team.    This patient's plan of care was discussed and created with Dr. Florencio and he is in agreement.  Signed: Dorene Comfort, PA-C  11/18/2023, 1:57 PM [facility]

## 2023-11-18 NOTE — ED Provider Notes (Signed)
 Case discussed with Dr. Debby with hospitalist team.  She will evaluate the patient for anticipated admission.   Levander Slate, MD 11/18/23 [phone]

## 2023-11-18 NOTE — Sepsis Progress Note (Signed)
 Elink will follow per sepsis protocol.

## 2023-11-18 NOTE — H&P (Addendum)
 History and Physical    Cody Burns FMW:[id number] DOB: Aug 14, 1965 DOA: 11/18/2023  [facility]  Patient coming from: home  I have personally briefly reviewed patient's old medical records in [facility]  Chief Complaint: sob  HPI: Cody Burns is a 58 y.o. male with medical history significant of  Hypertension, hypothyroidism, Multiple Sclerosis, CAD s/p MI, OSA  CVA,chronic diastolic HF,anxiety, allergies, alcohol use disorder, orthostatic hypotension, Hx of PAC vs artrial flutter and HLD who presented to ED BIB EMS with complaint of sob and chest tightness that started acutely  prior to presentation and was ongoing for close to 45 min.  IN the field patient was found to be in afib with rvr 170-230 with sat of 86% on ra. Patient was given 500ml NS and 10 mg cardizem IV and placed on Valders at 4L. Of note patient was recently admitted on 5/12-5/16  for diagnosis of Acute on chronic diastolic heart failure with associated acute hypoxemia for which he was treated with iv lasix  with good effect and GDMT optimized. Patient now  states he feels improved, noted no current chest pain. States chest pain has resolved, he also noted sob feels much improved.   ED Course:  IN ED vitals: 99.3, BP 186/111, hr 120, rr 30  BP 186/111 ZXH:zrunepr atrial tachycardia /atrial flutter, RBBB/LAFB( seen on prior EKG) Wbc 15.9, hgb 14.3, plt 353, left shift  Na 139, K 3.3, CL 105, cr 1.11 RVP-neg Cxr: FINDINGS: Diffuse pulmonary interstitial prominence consistent with pulmonary edema. Alveolar process left hemithorax could represent a superimposed pneumonia. Prominent cardiac silhouette. Small pleural effusions. No pneumothorax. Median sternotomy and CABG.   IMPRESSION: Findings consistent with CHF. Possible superimposed pneumonia on the left.   Tx -lasix  40 mg , nitroglycerin   Review of Systems: As per HPI otherwise 10 point review of systems negative.   Past Medical History:   Diagnosis Date   Blackout 2018   several episodes with unknown etiology. cause of his ankle break. awaiting neuro workup   CVA (cerebral vascular accident) (HCC)    Hypertension    Hypothyroidism    Multiple sclerosis (HCC) 1990   Myocardial infarct (HCC)    Optic neuritis    x 3. takes steriods and it resolves.   Seizures (HCC)    as a child. diagnosed with MS and once treated, seizures resolved.   Sleep apnea     Past Surgical History:  Procedure Laterality Date   CORONARY ARTERY BYPASS GRAFT  2015   CORONARY STENT INTERVENTION N/A 06/07/2017   Procedure: CORONARY STENT INTERVENTION;  Surgeon: Florencio Cara BIRCH, MD;  Location: [facility] INVASIVE CV LAB;  Service: Cardiovascular;  Laterality: N/A;   JOINT REPLACEMENT Left 2007   left hip replacement   LEFT HEART CATH AND CORS/GRAFTS ANGIOGRAPHY N/A 06/07/2017   Procedure: LEFT HEART CATH AND CORS/GRAFTS ANGIOGRAPHY;  Surgeon: Hester Wolm PARAS, MD;  Location: [facility] INVASIVE CV LAB;  Service: Cardiovascular;  Laterality: N/A;   ORIF ANKLE FRACTURE Right 09/22/2016   Procedure: OPEN REDUCTION INTERNAL FIXATION (ORIF) ANKLE FRACTURE;  Surgeon: Norleen PARAS Maltos, MD;  Location: [facility] ORS;  Service: Orthopedics;  Laterality: Right;   quadruple cardiac bypass  2015     reports that he has never smoked. He has quit using smokeless tobacco.  His smokeless tobacco use included chew. He reports current alcohol use. He reports that he does not use drugs.  Allergies  Allergen Reactions   Hydrochlorothiazide-Triamterene Other (See Comments) and Palpitations  High blood pressure   Metoprolol  Other (See Comments) and Palpitations    Exacerbates MS, High blood pressure   Penicillins Other (See Comments)    Sores in mouth  Has patient had a PCN reaction causing immediate rash, facial/tongue/throat swelling, SOB or lightheadedness with hypotension: No  Has patient had a PCN reaction causing severe rash involving mucus membranes or skin necrosis:  No  Has patient had a PCN reaction that required hospitalization: No  Has patient had a PCN reaction occurring within the last 10 years: No  If all of the above answers are NO, then may proceed with Cephalosporin use.  MOUTH SORES   Aripiprazole Other (See Comments)    WORSENED SLEEP WALKING    Family History  Problem Relation Age of Onset   Cancer Mother    Other Father    CAD Brother      Prior to Admission medications   Medication Sig Start Date End Date Taking? Authorizing Provider  Alirocumab 75 MG/ML SOAJ Inject 75 mg into the skin every 14 (fourteen) days. 03/16/23   [provider]  ascorbic acid  (VITAMIN C ) 500 MG tablet Take 1 tablet (500 mg total) by mouth daily. 10/09/23 01/07/24  Von Bellis, MD  aspirin  EC 81 MG tablet Take 81 mg by mouth daily.    [provider]  atorvastatin  (LIPITOR ) 80 MG tablet Take 80 mg by mouth at bedtime.    [provider]  buPROPion  (WELLBUTRIN  XL) 300 MG 24 hr tablet Take 300 mg by mouth daily.    [provider]  carvedilol  (COREG ) 6.25 MG tablet Take 1 tablet (6.25 mg total) by mouth 2 (two) times daily with a meal. 10/08/23   Von Bellis, MD  clopidogrel  (PLAVIX ) 75 MG tablet Take 75 mg by mouth daily.    [provider]  cyanocobalamin  1000 MCG tablet Take 1 tablet (1,000 mcg total) by mouth daily. 10/12/23 01/10/24  Von Bellis, MD  docusate sodium  (COLACE) 50 MG capsule Take 50 mg by mouth 2 (two) times daily.    [provider]  empagliflozin  (JARDIANCE ) 10 MG TABS tablet Take 1 tablet (10 mg total) by mouth daily. 10/09/23   Von Bellis, MD  escitalopram  (LEXAPRO ) 20 MG tablet Take 20 mg by mouth daily.    [provider]  ezetimibe  (ZETIA ) 10 MG tablet Take 10 mg by mouth daily. 06/10/23   [provider]  folic acid  (FOLVITE ) 400 MCG tablet Take 400 mcg by mouth daily.    [provider]  furosemide  (LASIX ) 40 MG tablet Take 1 tablet (40 mg  total) by mouth daily. 10/09/23   Von Bellis, MD  hydrOXYzine  (ATARAX ) 10 MG tablet Take 20 mg by mouth once as needed for anxiety. 02/19/23   [provider]  iron  polysaccharides (NIFEREX) 150 MG capsule Take 1 capsule (150 mg total) by mouth daily. 10/09/23 01/07/24  Von Bellis, MD  lamoTRIgine  (LAMICTAL ) 25 MG tablet Take 25 mg by mouth daily.    [provider]  montelukast  (SINGULAIR ) 10 MG tablet Take 10 mg by mouth daily.     [provider]  OXcarbazepine  (TRILEPTAL ) 150 MG tablet Take 1 tablet by mouth 2 (two) times daily. 02/23/20   [provider]  pregabalin  (LYRICA ) 150 MG capsule Take 1 capsule (150 mg total) by mouth 3 (three) times daily. 10/09/23   Von Bellis, MD  QUEtiapine  (SEROQUEL ) 100 MG tablet Take 100 mg by mouth at bedtime.    [provider]  senna-docusate (SENOKOT-S) 8.6-50 MG tablet Take 2 tablets by mouth 2 (two) times daily as needed. 06/10/23   [provider]  tiZANidine  (ZANAFLEX ) 4 MG tablet Take 4 mg by mouth every 8 (eight) hours as needed for muscle spasms. 02/25/21   [provider]    Physical Exam: Vitals:   11/18/23 0626 11/18/23 0632 11/18/23 0633  BP:  (!) 186/111   Pulse:  (!) 120   Resp:  (!) 30   Temp:  99.3 F (37.4 C)   TempSrc:  Oral   SpO2: 97% 93%   Weight:   110.2 kg  Height:   6' (1.829 m)    Constitutional: NAD, calm, comfortable Vitals:   11/18/23 0626 11/18/23 0632 11/18/23 0633  BP:  (!) 186/111   Pulse:  (!) 120   Resp:  (!) 30   Temp:  99.3 F (37.4 C)   TempSrc:  Oral   SpO2: 97% 93%   Weight:   110.2 kg  Height:   6' (1.829 m)   Eyes: PERRL, lids and conjunctivae normal ENMT: Mucous membranes are moist.  Neck: normal, supple, no masses, no thyromegaly Respiratory+ crackles. Normal respiratory effort. No accessory muscle use.  Cardiovascular: Regular rate and rhythm, no murmurs / rubs / gallops. + extremity edema. Abdomen: no tenderness, no masses  palpated. No hepatosplenomegaly. Bowel sounds positive.  Musculoskeletal: no clubbing / cyanosis. No joint deformity upper and lower extremities. Good ROM, no contractures. Normal muscle tone.  Skin: no rashes, lesions, ulcers. No induration Neurologic: CN grossly intact. Sensation intact,  Strength 5/5 in all 4.  Psychiatric: Normal judgment and insight. Alert and oriented x 3. Normal mood.    Labs on Admission: I have personally reviewed following labs and imaging studies  CBC: Recent Labs  Lab 11/18/23 0636  WBC 15.9*  NEUTROABS 11.9*  HGB 14.3  HCT 43.9  MCV 89.6  PLT 353   Basic Metabolic Panel: Recent Labs  Lab 11/18/23 0636  NA 139  K 3.3*  CL 105  CO2 23  GLUCOSE 127*  BUN 11  CREATININE 1.11  CALCIUM  9.0   GFR: Estimated Creatinine Clearance: 91.8 mL/min (by C-G formula based on SCr of 1.11 mg/dL). Liver Function Tests: No results for input(s): AST, ALT, ALKPHOS, BILITOT, PROT, ALBUMIN in the last 168 hours. No results for input(s): LIPASE, AMYLASE in the last 168 hours. No results for input(s): AMMONIA in the last 168 hours. Coagulation Profile: No results for input(s): INR, PROTIME in the last 168 hours. Cardiac Enzymes: No results for input(s): CKTOTAL, CKMB, CKMBINDEX, TROPONINI in the last 168 hours. BNP (last 3 results) No results for input(s): PROBNP in the last 8760 hours. HbA1C: No results for input(s): HGBA1C in the last 72 hours. CBG: No results for input(s): GLUCAP in the last 168 hours. Lipid Profile: No results for input(s): CHOL, HDL, LDLCALC, TRIG, CHOLHDL, LDLDIRECT in the last 72 hours. Thyroid Function Tests: No results for input(s): TSH, T4TOTAL, FREET4, T3FREE, THYROIDAB in the last 72 hours. Anemia Panel: No results for input(s): VITAMINB12, FOLATE, FERRITIN, TIBC, IRON , RETICCTPCT in the last 72 hours. Urine analysis:    Component Value Date/Time   COLORURINE  YELLOW (A) 10/12/2022 2104   APPEARANCEUR HAZY (A) 10/12/2022 2104   LABSPEC 1.016 10/12/2022 2104   PHURINE 5.0 10/12/2022 2104   GLUCOSEU NEGATIVE 10/12/2022 2104   HGBUR NEGATIVE 10/12/2022 2104   BILIRUBINUR NEGATIVE 10/12/2022 2104   KETONESUR 5 (A) 10/12/2022 2104   PROTEINUR 30 (A) 10/12/2022 2104  NITRITE NEGATIVE 10/12/2022 2104   LEUKOCYTESUR NEGATIVE 10/12/2022 2104    Radiological Exams on Admission: DG Chest Portable 1 View Result Date: 11/18/2023 CLINICAL DATA:  SOB EXAM: PORTABLE CHEST 1 VIEW COMPARISON:  10/04/2023. FINDINGS: Diffuse pulmonary interstitial prominence consistent with pulmonary edema. Alveolar process left hemithorax could represent a superimposed pneumonia. Prominent cardiac silhouette. Small pleural effusions. No pneumothorax. Median sternotomy and CABG. IMPRESSION: Findings consistent with CHF. Possible superimposed pneumonia on the left. Electronically Signed   By: Fonda Field M.D.   On: 11/18/2023 07:24    EKG: Independently reviewed.   Assessment/Plan Acute on chronic diastolic HF Acute hypoxic respiratory failure - in setting of afib with rvr  -admit to progressive care  --lasix  60 mg iv bid , monitor closely to avoid overdiuresis -cycle ce -ekg without hyperacute st -twave changes  -continue on GDMT  -previous Echo note EF fo 40-45% down prior which noted preserved EF   Atrial Flutter with RVR -previously diagnosed - not on OAC due to hx of falls  - on carvedilol , per patient not taking  - most recent d/c 5/16 patient was discharged on this medication  - pre card start amiodarone drip  - Dig x 1 given, can reload if patient continues to have elevated HR on amiodarone  NSTEMI Type II CAD s/p MI -in setting of Atrial flutter with rvr   - continue plavix  /statin, zetia   -continue to cycle CE  -f/u with echo in a -wait cardiology recs   CAP presumed bacterial  -patient with increase wbc and  Opacities on cxr unable to r/o  infection  -ctx/ azithromycin, de-escalate as able  -pulmonary toilet  -urine ag, sputum, f/u on culture data   -of note RVP negative   Multiple sclerosis: -chronic debility  -PT to see  -resume home regimen as able    Hx of ETOH abuse  - place on ciwa  - check ETOH level  -continue MVI and folic acid    Hx of HTN and Orthostatic hypotension   - check baseline orthostatics   -resume anti-hypertensive's as bp can tolerate to allow for adequate diuresis   HLD -resume statin   Hx of CVA -continue on secondary ppx    Anxiety  -resume bupropion  and atarax  prn     DVT prophylaxis: heparin  Code Status: full/ as discussed per patient wishes in event of cardiac arrest  Family Communication: none at bedside Disposition Plan: patient  expected to be admitted greater than 2 midnights  Consults called: none Admission status: progressive care   Camila DELENA Ned MD [facility]   If 7PM-7AM, please contact night-coverage [email or web address] Password [facility]  11/18/2023, 7:58 AM

## 2023-11-18 NOTE — Consult Note (Signed)
 CODE SEPSIS - PHARMACY COMMUNICATION  **Broad Spectrum Antibiotics should be administered within 1 hour of Sepsis diagnosis**  Time Code Sepsis Called/Page Received: 0729  Antibiotics Ordered: ceftriaxone and azithromycin  Time of 1st antibiotic administration: 0918  Additional action taken by pharmacy: Messaged nurse  If necessary, Name of Provider/Nurse Contacted: Penne Bruckner, RN    Kayla JULIANNA Blew ,PharmD Clinical Pharmacist  11/18/2023  8:32 AM

## 2023-11-18 NOTE — ED Triage Notes (Signed)
 Pt presented to ED BIBA from home with c/o shortness of breath and chest tightness starting approx 30-45 mins PTA. EMS states initially found in afib RVR rate 170-230 and 86% on room air. Pt given 500ml NS and 10mg  Cardizem IVP in route. Pt placed on 4L via [place].  HR 120 on arrival. On Plavix .

## 2023-11-18 NOTE — ED Notes (Signed)
 RN notified provider of newest BP and also asked for parameter goals. MD sts we can start to wean drip and will start oral and iv bolus goal sbp< 185 and bp > 110 and titrate off nitroglycerin  drip RN waiting for further orders.

## 2023-11-18 NOTE — Consult Note (Signed)
 Pharmacy Consult Note - Anticoagulation  Pharmacy Consult for heparin  Indication: atrial flutter  PATIENT MEASUREMENTS: Height: 6' (182.9 cm) Weight: 110.2 kg (243 lb) IBW/kg (Calculated) : 77.6 HEPARIN  DW (KG): 101  VITAL SIGNS: Temp: 99 F (37.2 C) (06/26 1929) Temp Source: Oral (06/26 1514) BP: 113/73 (06/26 1929) Pulse Rate: 50 (06/26 1929)  Recent Labs    11/18/23 0636 11/18/23 0912 11/18/23 1500 11/18/23 1715 11/18/23 2254  HGB 14.3  --   --   --   --   HCT 43.9  --   --   --   --   PLT 353  --   --   --   --   APTT  --   --   --  140*  --   LABPROT  --   --   --  14.8  --   INR  --   --   --  1.1  --   HEPARINUNFRC  --   --   --   --  0.63  CREATININE 1.11  --   --   --   --   TROPONINIHS 69*   < > 212*  --   --    < > = values in this interval not displayed.    Estimated Creatinine Clearance: 91.8 mL/min (by C-G formula based on SCr of 1.11 mg/dL).  PAST MEDICAL HISTORY: Past Medical History:  Diagnosis Date   Blackout 2018   several episodes with unknown etiology. cause of his ankle break. awaiting neuro workup   CVA (cerebral vascular accident) (HCC)    Hypertension    Hypothyroidism    Multiple sclerosis (HCC) 1990   Myocardial infarct (HCC)    Optic neuritis    x 3. takes steriods and it resolves.   Seizures (HCC)    as a child. diagnosed with MS and once treated, seizures resolved.   Sleep apnea     ASSESSMENT: 58 y.o. male with PMH including CVA, CHF, HTN, CAD (MI) is presenting with acute on chronic heart failure and atrial flutter. CHA2DS2VASc is at least 5 (CHF, HTN, CVA+2, CAD/MI.) Patient is not on chronic anticoagulation per chart review but does take clopidogrel  and baby aspirin . Pharmacy has been consulted to initiate and manage heparin  intravenous infusion.  Additional baseline anticoagulation labs have been ordered.  Pertinent medications: No chronic anticoagulation Clopidogrel  75mg  daily Aspirin  81mg  daily  Goal(s) of  therapy: Heparin  level 0.3 - 0.7 units/mL Monitor platelets by anticoagulation protocol: Yes   Baseline anticoagulation labs: Recent Labs    11/18/23 0636 11/18/23 1715  APTT  --  140*  INR  --  1.1  HGB 14.3  --   PLT 353  --     Date Time aPTT/HL Rate/Comment 06/26 2254 HL 0.63 Therapeutic x 1   PLAN: Continue heparin  infusion at 1500 units/hour. Recheck HL w/ AM labs to confirm, then daily once at least two levels are consecutively therapeutic. Monitor CBC daily while on heparin  infusion.  Rankin CANDIE Dills, PharmD, [facility] 11/18/2023 11:28 PM

## 2023-11-18 NOTE — Consult Note (Addendum)
 Pharmacy Consult Note - Anticoagulation  Pharmacy Consult for heparin  Indication: atrial flutter  PATIENT MEASUREMENTS: Height: 6' (182.9 cm) Weight: 110.2 kg (243 lb) IBW/kg (Calculated) : 77.6 HEPARIN  DW (KG): 101  VITAL SIGNS: Temp: 98.5 F (36.9 C) (06/26 1016) Temp Source: Oral (06/26 1016) BP: 124/107 (06/26 1245) Pulse Rate: 126 (06/26 1245)  Recent Labs    11/18/23 0636 11/18/23 0912 11/18/23 1302  HGB 14.3  --   --   HCT 43.9  --   --   PLT 353  --   --   CREATININE 1.11  --   --   TROPONINIHS 69*   < > 199*   < > = values in this interval not displayed.    Estimated Creatinine Clearance: 91.8 mL/min (by C-G formula based on SCr of 1.11 mg/dL).  PAST MEDICAL HISTORY: Past Medical History:  Diagnosis Date   Blackout 2018   several episodes with unknown etiology. cause of his ankle break. awaiting neuro workup   CVA (cerebral vascular accident) (HCC)    Hypertension    Hypothyroidism    Multiple sclerosis (HCC) 1990   Myocardial infarct (HCC)    Optic neuritis    x 3. takes steriods and it resolves.   Seizures (HCC)    as a child. diagnosed with MS and once treated, seizures resolved.   Sleep apnea     ASSESSMENT: 58 y.o. male with PMH including CVA, CHF, HTN, CAD (MI) is presenting with acute on chronic heart failure and atrial flutter. CHA2DS2VASc is at least 5 (CHF, HTN, CVA+2, CAD/MI.) Patient is not on chronic anticoagulation per chart review but does take clopidogrel  and baby aspirin . Pharmacy has been consulted to initiate and manage heparin  intravenous infusion.  Additional baseline anticoagulation labs have been ordered.  Pertinent medications: No chronic anticoagulation Clopidogrel  75mg  daily Aspirin  81mg  daily  Goal(s) of therapy: Heparin  level 0.3 - 0.7 units/mL Monitor platelets by anticoagulation protocol: Yes   Baseline anticoagulation labs: Recent Labs    11/18/23 0636  HGB 14.3  PLT 353     Date Time aPTT/HL Rate/Comment    PLAN: Give 5000 units bolus x1; then start heparin  infusion at 1500 units/hour. Check heparin  level in 6 hours, then daily once at least two levels are consecutively therapeutic. Monitor CBC daily while on heparin  infusion.   Will M. Lenon, PharmD Clinical Pharmacist 11/18/2023 2:17 PM

## 2023-11-18 NOTE — Progress Notes (Signed)
 Heart Failure Navigator Progress Note  Assessed for Heart & Vascular TOC clinic readiness.  Patient does not meet criteria due to current [facility] patient.   Navigator will sign off at this time.  Roxy Horseman, RN, BSN [facility] Heart Failure Navigator Secure Chat Only

## 2023-11-18 NOTE — ED Provider Notes (Signed)
 [facility] Provider Note    Event Date/Time   First MD Initiated Contact with Patient 11/18/23 0630     (approximate)   History   Shortness of Breath   HPI Cody Burns is a 58 y.o. male with history of HFpEF, HTN, HLD, prior CVA, prior CAD, A-fib, on aspirin /Plavix  presenting today for shortness of breath.  Patient has noted over the past several days he had worsening shortness of breath with ambulation but felt fine at rest.  Over the last night he is now getting short of breath even at rest.  Unable to lay flat.  Noted swelling to bilateral lower extremities.  Mild chest pain present over the past day.  Has productive cough.  No congestion or fever.  No abdominal pain, nausea, vomiting.  EMS picked up patient and he was found to be in A-fib with RVR with rates as high as 170 and given 10 mg of diltiazem which brought him down to the 110s.  Also found to be hypoxic in the low 80s and placed on 4 L.     Physical Exam   Triage Vital Signs: ED Triage Vitals [11/18/23 0626]  Encounter Vitals Group     BP      Girls Systolic BP Percentile      Girls Diastolic BP Percentile      Boys Systolic BP Percentile      Boys Diastolic BP Percentile      Pulse      Resp      Temp      Temp src      SpO2 97 %     Weight      Height      Head Circumference      Peak Flow      Pain Score      Pain Loc      Pain Education      Exclude from Growth Chart     Most recent vital signs: Vitals:   11/18/23 0626 11/18/23 0632  BP:  (!) 186/111  Pulse:  (!) 120  Resp:  (!) 30  Temp:  99.3 F (37.4 C)  SpO2: 97% 93%   Physical Exam: I have reviewed the vital signs and nursing notes. General: Awake, alert, mild respiratory distress Head:  Atraumatic, normocephalic.   ENT:  EOM intact, PERRL. Oral mucosa is pink and moist with no lesions. Neck: Neck is supple with full range of motion, No meningeal signs. Cardiovascular: Tachycardic with irregular rhythm, No  murmurs. Peripheral pulses palpable and equal bilaterally. Respiratory:  Symmetrical chest wall expansion.  Tachypnea with rhonchi throughout and accessory muscle usage. Musculoskeletal:  No cyanosis.  1+ pitting edema to bilateral lower extremities.  Moving extremities with full ROM Abdomen:  Soft, nontender, nondistended. Neuro:  GCS 15, moving all four extremities, interacting appropriately. Speech clear. Psych:  Calm, appropriate.   Skin:  Warm, dry, no rash.    ED Results / Procedures / Treatments   Labs (all labs ordered are listed, but only abnormal results are displayed) Labs Reviewed  CBC WITH DIFFERENTIAL/PLATELET - Abnormal; Notable for the following components:      Result Value   WBC 15.9 (*)    RDW 15.7 (*)    Neutro Abs 11.9 (*)    Monocytes Absolute 1.1 (*)    Abs Immature Granulocytes 0.08 (*)    All other components within normal limits  BASIC METABOLIC PANEL WITH GFR - Abnormal; Notable for the following  components:   Potassium 3.3 (*)    Glucose, Bld 127 (*)    All other components within normal limits  TROPONIN I (HIGH SENSITIVITY) - Abnormal; Notable for the following components:   Troponin I (High Sensitivity) 69 (*)    All other components within normal limits  RESP PANEL BY RT-PCR (RSV, FLU A&B, COVID)  RVPGX2  CULTURE, BLOOD (ROUTINE X 2)  CULTURE, BLOOD (ROUTINE X 2)  BRAIN NATRIURETIC PEPTIDE  LACTIC ACID, PLASMA  LACTIC ACID, PLASMA     EKG My EKG interpretation: Rate of 110, A-fib with RVR.  Right bundle branch block with left anterior fascicular block.  No acute ST elevations or depressions   RADIOLOGY Independently interpreted chest x-ray with evidence of pulmonary edema and possible superimposed left-sided pneumonia   PROCEDURES:  Critical Care performed: Yes, see critical care procedure note(s)  .Critical Care  Performed by: Malvina Alm DASEN, MD Authorized by: Malvina Alm DASEN, MD   Critical care provider statement:    Critical care  time (minutes):  30   Critical care was necessary to treat or prevent imminent or life-threatening deterioration of the following conditions:  Sepsis and respiratory failure   Critical care was time spent personally by me on the following activities:  Development of treatment plan with patient or surrogate, discussions with consultants, evaluation of patient's response to treatment, examination of patient, ordering and review of laboratory studies, ordering and review of radiographic studies, ordering and performing treatments and interventions, pulse oximetry, re-evaluation of patient's condition and review of old charts   I assumed direction of critical care for this patient from another provider in my specialty: no      MEDICATIONS ORDERED IN ED: Medications  nitroGLYCERIN  50 mg in dextrose  5 % 250 mL (0.2 mg/mL) infusion (has no administration in time range)  lactated ringers  infusion (has no administration in time range)  cefTRIAXone (ROCEPHIN) 2 g in sodium chloride  0.9 % 100 mL IVPB (has no administration in time range)  azithromycin (ZITHROMAX) 500 mg in sodium chloride  0.9 % 250 mL IVPB (has no administration in time range)  nitroGLYCERIN  (NITROSTAT ) SL tablet 0.4 mg (0.4 mg Sublingual Given 11/18/23 0641)  furosemide  (LASIX ) injection 40 mg (40 mg Intravenous Given 11/18/23 0641)     IMPRESSION / MDM / ASSESSMENT AND PLAN / ED COURSE  I reviewed the triage vital signs and the nursing notes.                              Differential diagnosis includes, but is not limited to, CHF exacerbation, A-fib with RVR, pneumonia, COVID, flu, RSV, ACS  Patient's presentation is most consistent with acute presentation with potential threat to life or bodily function.  Patient is a 58 year old male presenting today for shortness of breath.  Patient is in A-fib with RVR with rates around 110-120.  Also hypoxic and on 4 L.  Auscultation of the lungs consistent with suspected pulmonary edema from  CHF exacerbation.  Blood pressure elevated at 170/110.  Will give nitroglycerin  and Lasix  to help with suspected pulmonary edema.  Suspect at this time his A-fib is compensatory so will not immediately treat while continue to monitor with good blood pressures.  Patient with improvement in chest pain and mild shortness of breath symptoms following nitroglycerin .  Laboratory workup shows leukocytosis at 15.  BMP reassuring.  Initial troponin elevated at 69.  No ongoing chest pain but will continue to trend.  Chest x-ray and bedside ultrasound confirmed pulmonary edema.  Blood pressure maintaining above 160 and heart rate maintaining in the 120s.  Chest x-ray also concern of possible left-sided pneumonia.  Will start on broad-spectrum antibiotics and order lactic's and blood culture given he meets sepsis criteria.  Will start on nitro drip as his blood pressure is able to support at this time to help with his shortness of breath. Patient signed out to oncoming provider pending accepting hospitalist.  The patient is on the cardiac monitor to evaluate for evidence of arrhythmia and/or significant heart rate changes.     FINAL CLINICAL IMPRESSION(S) / ED DIAGNOSES   Final diagnoses:  Acute hypoxic respiratory failure (HCC)  Sepsis, due to unspecified organism, unspecified whether acute organ dysfunction present (HCC)  Pneumonia of left lower lobe due to infectious organism  Atrial fibrillation with RVR (HCC)     Rx / DC Orders   ED Discharge Orders     None        Note:  This document was prepared using Dragon voice recognition software and may include unintentional dictation errors.   Malvina Alm DASEN, MD 11/18/23 [phone]

## 2023-11-18 NOTE — ED Notes (Signed)
 MD notified or trop being 126.

## 2023-11-19 ENCOUNTER — Other Ambulatory Visit (HOSPITAL_COMMUNITY): Payer: Self-pay

## 2023-11-19 ENCOUNTER — Inpatient Hospital Stay: Admit: 2023-11-19 | Discharge: 2023-11-19 | Disposition: A | Discharge status: Home / Self Care

## 2023-11-19 ENCOUNTER — Inpatient Hospital Stay: Admitting: Anesthesiology

## 2023-11-19 ENCOUNTER — Encounter: Payer: Self-pay | Admitting: Internal Medicine

## 2023-11-19 ENCOUNTER — Encounter
Admission: EM | Disposition: A | Discharge status: Home / Self Care | Payer: Self-pay | Source: Home / Self Care | Attending: Internal Medicine

## 2023-11-19 DIAGNOSIS — I493 Ventricular premature depolarization: Secondary | ICD-10-CM

## 2023-11-19 DIAGNOSIS — R7989 Other specified abnormal findings of blood chemistry: Secondary | ICD-10-CM | POA: Diagnosis not present

## 2023-11-19 DIAGNOSIS — I5033 Acute on chronic diastolic (congestive) heart failure: Secondary | ICD-10-CM | POA: Diagnosis not present

## 2023-11-19 DIAGNOSIS — I4892 Unspecified atrial flutter: Secondary | ICD-10-CM

## 2023-11-19 DIAGNOSIS — Z8673 Personal history of transient ischemic attack (TIA), and cerebral infarction without residual deficits: Secondary | ICD-10-CM

## 2023-11-19 DIAGNOSIS — F419 Anxiety disorder, unspecified: Secondary | ICD-10-CM

## 2023-11-19 DIAGNOSIS — E876 Hypokalemia: Secondary | ICD-10-CM | POA: Diagnosis not present

## 2023-11-19 DIAGNOSIS — G35 Multiple sclerosis: Secondary | ICD-10-CM

## 2023-11-19 DIAGNOSIS — F32A Depression, unspecified: Secondary | ICD-10-CM

## 2023-11-19 DIAGNOSIS — R569 Unspecified convulsions: Secondary | ICD-10-CM

## 2023-11-19 DIAGNOSIS — I4819 Other persistent atrial fibrillation: Secondary | ICD-10-CM | POA: Diagnosis not present

## 2023-11-19 HISTORY — PX: TEE WITHOUT CARDIOVERSION: SHX5443

## 2023-11-19 HISTORY — PX: CARDIOVERSION: SHX1299

## 2023-11-19 LAB — COMPREHENSIVE METABOLIC PANEL WITH GFR
ALT: 14 U/L (ref 0–44)
AST: 14 U/L — ABNORMAL LOW (ref 15–41)
Albumin: 3.2 g/dL — ABNORMAL LOW (ref 3.5–5.0)
Alkaline Phosphatase: 62 U/L (ref 38–126)
Anion gap: 9 (ref 5–15)
BUN: 9 mg/dL (ref 6–20)
CO2: 28 mmol/L (ref 22–32)
Calcium: 8.5 mg/dL — ABNORMAL LOW (ref 8.9–10.3)
Chloride: 104 mmol/L (ref 98–111)
Creatinine, Ser: 1.13 mg/dL (ref 0.61–1.24)
GFR, Estimated: >60 mL/min (ref >60)
Glucose, Bld: 96 mg/dL (ref 70–99)
Potassium: 2.5 mmol/L — CL (ref 3.5–5.1)
Sodium: 141 mmol/L (ref 135–145)
Total Bilirubin: 1.4 mg/dL — ABNORMAL HIGH (ref 0.0–1.2)
Total Protein: 5.9 g/dL — ABNORMAL LOW (ref 6.5–8.1)

## 2023-11-19 LAB — CBC
HCT: 36.8 % — ABNORMAL LOW (ref 39.0–52.0)
Hemoglobin: 12.1 g/dL — ABNORMAL LOW (ref 13.0–17.0)
MCH: 28.8 pg (ref 26.0–34.0)
MCHC: 32.9 g/dL (ref 30.0–36.0)
MCV: 87.6 fL (ref 80.0–100.0)
Platelets: 292 10*3/uL (ref 150–400)
RBC: 4.2 MIL/uL — ABNORMAL LOW (ref 4.22–5.81)
RDW: 15.6 % — ABNORMAL HIGH (ref 11.5–15.5)
WBC: 10.5 10*3/uL (ref 4.0–10.5)
nRBC: 0 % (ref 0.0–0.2)

## 2023-11-19 LAB — ECHOCARDIOGRAM COMPLETE
AR max vel: 2.63 cm2
AV Area VTI: 3.5 cm2
AV Area mean vel: 2.92 cm2
AV Mean grad: 2 mmHg
AV Peak grad: 3 mmHg
Ao pk vel: 0.87 m/s
Area-P 1/2: 3.72 cm2
Height: 72 in
S' Lateral: 4.7 cm
Weight: 3844.82 [oz_av]

## 2023-11-19 LAB — LEGIONELLA PNEUMOPHILA SEROGP 1 UR AG: L. pneumophila Serogp 1 Ur Ag: NEGATIVE

## 2023-11-19 LAB — TROPONIN I (HIGH SENSITIVITY): Troponin I (High Sensitivity): 89 ng/L — ABNORMAL HIGH (ref <18)

## 2023-11-19 LAB — HEPARIN LEVEL (UNFRACTIONATED): Heparin Unfractionated: 0.52 [IU]/mL (ref 0.30–0.70)

## 2023-11-19 LAB — POTASSIUM: Potassium: 3.3 mmol/L — ABNORMAL LOW (ref 3.5–5.1)

## 2023-11-19 LAB — MAGNESIUM: Magnesium: 2.2 mg/dL (ref 1.7–2.4)

## 2023-11-19 SURGERY — ECHOCARDIOGRAM, TRANSESOPHAGEAL
Anesthesia: General

## 2023-11-19 MED ORDER — ESCITALOPRAM OXALATE 10 MG PO TABS
20.0000 mg | ORAL_TABLET | Freq: Every day | ORAL | Status: DC
  Administered 2023-11-19 – 2023-11-20 (×2): 20 mg via ORAL
  Filled 2023-11-19: qty 1
  Filled 2023-11-19 (×2): qty 2

## 2023-11-19 MED ORDER — POTASSIUM CHLORIDE CRYS ER 20 MEQ PO TBCR: 40.0000 meq | EXTENDED_RELEASE_TABLET | Freq: Three times a day (TID) | ORAL | Status: DC

## 2023-11-19 MED ORDER — POTASSIUM CHLORIDE CRYS ER 20 MEQ PO TBCR
40.0000 meq | EXTENDED_RELEASE_TABLET | ORAL | Status: AC
  Administered 2023-11-19 (×2): 40 meq via ORAL
  Filled 2023-11-19 (×2): qty 2

## 2023-11-19 MED ORDER — POTASSIUM CHLORIDE CRYS ER 20 MEQ PO TBCR
20.0000 meq | EXTENDED_RELEASE_TABLET | Freq: Two times a day (BID) | ORAL | Status: DC
  Filled 2023-11-19: qty 1

## 2023-11-19 MED ORDER — BUPROPION HCL ER (XL) 150 MG PO TB24
300.0000 mg | ORAL_TABLET | Freq: Every day | ORAL | Status: DC
  Administered 2023-11-19 – 2023-11-20 (×2): 300 mg via ORAL
  Filled 2023-11-19 (×2): qty 2
  Filled 2023-11-19: qty 1

## 2023-11-19 MED ORDER — POTASSIUM CHLORIDE 10 MEQ/100ML IV SOLN
10.0000 meq | Freq: Once | INTRAVENOUS | Status: AC
  Administered 2023-11-19: 10 meq via INTRAVENOUS
  Filled 2023-11-19: qty 100

## 2023-11-19 MED ORDER — BUTAMBEN-TETRACAINE-BENZOCAINE 2-2-14 % EX AERO
INHALATION_SPRAY | CUTANEOUS | Status: AC
Start: 2023-11-19 — End: 2023-11-19
  Filled 2023-11-19: qty 5

## 2023-11-19 MED ORDER — QUETIAPINE FUMARATE 25 MG PO TABS
100.0000 mg | ORAL_TABLET | Freq: Every day | ORAL | Status: DC
  Administered 2023-11-19: 100 mg via ORAL
  Filled 2023-11-19: qty 4
  Filled 2023-11-19: qty 1

## 2023-11-19 MED ORDER — BUTAMBEN-TETRACAINE-BENZOCAINE 2-2-14 % EX AERO
1.0000 | INHALATION_SPRAY | Freq: Once | CUTANEOUS | Status: AC
  Administered 2023-11-19: 1 via TOPICAL
  Filled 2023-11-19: qty 20

## 2023-11-19 MED ORDER — APIXABAN 5 MG PO TABS
5.0000 mg | ORAL_TABLET | Freq: Two times a day (BID) | ORAL | Status: DC
  Administered 2023-11-19 – 2023-11-20 (×3): 5 mg via ORAL
  Filled 2023-11-19 (×3): qty 1

## 2023-11-19 MED ORDER — AMIODARONE HCL 200 MG PO TABS
400.0000 mg | ORAL_TABLET | Freq: Two times a day (BID) | ORAL | Status: DC
  Administered 2023-11-19 – 2023-11-20 (×3): 400 mg via ORAL
  Filled 2023-11-19 (×3): qty 2

## 2023-11-19 MED ORDER — POTASSIUM CHLORIDE CRYS ER 20 MEQ PO TBCR
40.0000 meq | EXTENDED_RELEASE_TABLET | ORAL | Status: DC
  Administered 2023-11-19: 40 meq via ORAL
  Filled 2023-11-19: qty 2

## 2023-11-19 MED ORDER — EMPAGLIFLOZIN 10 MG PO TABS
10.0000 mg | ORAL_TABLET | Freq: Every day | ORAL | Status: DC
  Administered 2023-11-19 – 2023-11-20 (×2): 10 mg via ORAL
  Filled 2023-11-19 (×3): qty 1

## 2023-11-19 MED ORDER — FUROSEMIDE 10 MG/ML IJ SOLN
40.0000 mg | Freq: Two times a day (BID) | INTRAMUSCULAR | Status: DC
  Administered 2023-11-19 – 2023-11-20 (×3): 40 mg via INTRAVENOUS
  Filled 2023-11-19 (×3): qty 4

## 2023-11-19 MED ORDER — PROPOFOL 10 MG/ML IV BOLUS
INTRAVENOUS | Status: AC
  Filled 2023-11-19: qty 40

## 2023-11-19 MED ORDER — LIDOCAINE VISCOUS HCL 2 % MT SOLN
OROMUCOSAL | Status: AC
  Filled 2023-11-19: qty 15

## 2023-11-19 MED ORDER — HYDROXYZINE HCL 10 MG PO TABS
20.0000 mg | ORAL_TABLET | Freq: Once | ORAL | Status: DC | PRN
  Filled 2023-11-19: qty 2

## 2023-11-19 MED ORDER — AMIODARONE HCL 200 MG PO TABS: 200.0000 mg | ORAL_TABLET | Freq: Every day | ORAL | Status: DC

## 2023-11-19 MED ORDER — PROPOFOL 10 MG/ML IV BOLUS
INTRAVENOUS | Status: DC | PRN
  Administered 2023-11-19 (×2): 20 mg via INTRAVENOUS
  Administered 2023-11-19: 10 mg via INTRAVENOUS
  Administered 2023-11-19: 20 mg via INTRAVENOUS
  Administered 2023-11-19: 50 mg via INTRAVENOUS
  Administered 2023-11-19 (×2): 10 mg via INTRAVENOUS
  Administered 2023-11-19: 30 mg via INTRAVENOUS

## 2023-11-19 MED ORDER — SODIUM CHLORIDE 0.9 % IV SOLN: INTRAVENOUS | Status: DC

## 2023-11-19 MED ORDER — POTASSIUM CHLORIDE CRYS ER 20 MEQ PO TBCR
40.0000 meq | EXTENDED_RELEASE_TABLET | Freq: Once | ORAL | Status: AC
  Administered 2023-11-19: 40 meq via ORAL
  Filled 2023-11-19: qty 2

## 2023-11-19 NOTE — Progress Notes (Signed)
 Progress Note   Patient: Cody Burns FMW:[id number] DOB: [birth year] DOA: 11/18/2023     1 DOS: the patient was seen and examined on 11/19/2023   Brief hospital course: Taken from prior notes.  Damoni Causby is a 58 y.o. male with medical history significant of  Hypertension, hypothyroidism, Multiple Sclerosis, CAD s/p MI, OSA , CVA,chronic diastolic HF,anxiety, allergies, alcohol use disorder, orthostatic hypotension, Hx of PAC vs artrial flutter and HLD who presented to ED BIB EMS with complaint of sob and chest tightness that started acutely  prior to presentation.  Patient was recently admitted on 5/12-5/16 for diagnosis of Acute on chronic diastolic heart failure with associated acute hypoxemia for which he was treated with iv lasix  with good effect and GDMT optimized.   Patient was found to be in atrial flutter with RVR, rate between 170-230 and hypoxic on room air. Labs with some leukocytosis, mild hypokalemia at 3.3, respiratory viral panel negative.  Chest x-ray with diffuse pulmonary interstitial prominence concerning for pulmonary edema and small pleural effusions.  There was also concern of possible superimposed pneumonia on left.  Cardiology was consulted and patient was given a dose of digoxin  and started on amiodarone  infusion.  Mildly elevated troponin so he was also started on heparin  infusion.  6/27: Heart rate remained elevated in the low 100s, troponin peaked at 212 and now trending down, leukocytosis resolved but all cell line decreased, potassium at 2.5 with magnesium  of 2.2 - Replating potassium and recheck at 1 PM-repeat at 3.3 Cardiology is planning DCCV later today.  Patient was also started on antibiotics for concern of CAP on imaging.  Procalcitonin negative, strep pneumo negative, preliminary blood cultures negative.  Likely just had pulmonary vascular congestion so discontinuing antibiotics.  Assessment and Plan: * Atrial flutter with rapid ventricular response  (HCC) Heart rate remained elevated but little improved than before.  Cardiology is on board and patient was started on amiodarone  infusion. Going for TEE followed by DCCV with cardiology today. -Currently on heparin  infusion -Follow-up cardiology recommendations  CHF (congestive heart failure) (HCC) Patient with history of HFpEF, chest x-ray concerning for pulmonary congestion. Repeat echocardiogram done-pending results -Continue with IV Lasix  -Daily weight and BMP -Strict intake and output  Elevated troponin Troponin peaked at 212 and then started trending down.  No chest pain.  Likely due to demand ischemia secondary to atrial flutter with RVR. - Currently on heparin  infusion -Continue to monitor -Repeat echocardiogram done-pending results  Hypokalemia Potassium at 2.5 with normal magnesium . - Replace potassium and monitor  History of stroke Patient has an history of left MCA stroke.  No new concern or deficit. - Patient was on aspirin  and Plavix  at home which is currently being held and vascular will restart as appropriate after the procedure -Continue with statin -Continue outpatient alirocumab  Anxiety and depression - Continue home Wellbutrin , Lexapro , Seroquel  and as needed Atarax   Multiple sclerosis (HCC) - Continue with outpatient follow-up  Seizures (HCC) No acute concern. -Continuing home Trileptal  and lamotrigine    Subjective: Patient was seen and examined today.  Denies any chest pain or shortness of breath.  He was getting his echocardiogram.  Physical Exam: Vitals:   11/19/23 1303 11/19/23 1304 11/19/23 1305 11/19/23 1306  BP: (!) 94/59     Pulse: 85 88 83 87  Resp: 15 16 19  (!) 21  Temp:      TempSrc:      SpO2: 98% 96% 98% 93%  Weight:      Height:  General.  Overweight gentleman, in no acute distress. Pulmonary.  Lungs clear bilaterally, normal respiratory effort. CV.  Irregularly irregular Abdomen.  Soft, nontender, nondistended, BS  positive. CNS.  Alert and oriented .  No focal neurologic deficit. Extremities.  No edema, no cyanosis, pulses intact and symmetrical. Psychiatry.  Judgment and insight appears normal.   Data Reviewed: Prior data reviewed  Family Communication: Discussed with patient  Disposition: Status is: Inpatient Remains inpatient appropriate because: Severity of illness  Planned Discharge Destination: Home  DVT prophylaxis.  Heparin  infusion Time spent: 50 minutes  This record has been created using Conservation officer, historic buildings. Errors have been sought and corrected,but may not always be located. Such creation errors do not reflect on the standard of care.   Author: Amaryllis Dare, MD 11/19/2023 1:14 PM  For on call review [email or web address].

## 2023-11-19 NOTE — Progress Notes (Signed)
*  PRELIMINARY RESULTS* Echocardiogram Echocardiogram Transesophageal has been performed.  Floydene Harder 11/19/2023, 1:04 PM

## 2023-11-19 NOTE — Anesthesia Preprocedure Evaluation (Signed)
 Anesthesia Evaluation  Patient identified by MRN, date of birth, ID band Patient awake    Reviewed: Allergy & Precautions, NPO status , Patient's Chart, lab work & pertinent test results  History of Anesthesia Complications Negative for: history of anesthetic complications  Airway Mallampati: II  TM Distance: >3 FB Neck ROM: Full    Dental no notable dental hx.    Pulmonary neg shortness of breath, sleep apnea , neg COPD, Patient abstained from smoking.Not current smoker   breath sounds clear to auscultation- rhonchi (-) wheezing      Cardiovascular Exercise Tolerance: Good METShypertension, Pt. on medications (-) angina + CAD, + Past MI, + CABG and +CHF  + dysrhythmias Atrial Fibrillation  Rhythm:Irregular Rate:Tachycardia - Systolic murmurs and - Diastolic murmurs Per cardiology note: 1 borderline troponins possible demand ischemia versus non-STEMI we will follow-up additional troponins follow-up EKGs   2 atrial flutter consider antiarrhythmic anticoagulant rate control consider referral to EP for possible ablation will consider cardioversion   3 mild cardiomyopathy continue current management repeat echocardiogram for repeat assessment consider ACE ARB or Arni beta-blocker spironolactone  consider adding digoxin    4 borderline hypotension limits medical options for A-flutter rate controlled Heart failure recommend amiodarone  possibly digoxin  additional doses of magnesium  to help with rate management   5 coronary disease multivessel coronary bypass surgery PCI and stent continue nitrates beta-blocker ARB spironolactone  SGLT2    Neuro/Psych  PSYCHIATRIC DISORDERS  Depression    Multiple sclerosis  negative neurological ROS     GI/Hepatic negative GI ROS, Neg liver ROS,neg GERD  ,,  Endo/Other  neg diabetesHypothyroidism    Renal/GU negative Renal ROS     Musculoskeletal negative musculoskeletal ROS (+)     Abdominal  (+) + obese  Peds  Hematology negative hematology ROS (+)   Anesthesia Other Findings Past Medical History: 2018: Blackout     Comment: several episodes with unknown etiology. cause               of his ankle break. 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 takes steriods and it resolves. No date: Seizures (HCC)     Comment: as a child. diagnosed with MS and once               treated, seizures resolved. No date: Sleep apnea   Reproductive/Obstetrics                             Anesthesia Physical Anesthesia Plan  ASA: 3  Anesthesia Plan: General   Post-op Pain Management: Minimal or no pain anticipated   Induction: Intravenous  PONV Risk Score and Plan: 2 and Propofol  infusion, TIVA and Ondansetron   Airway Management Planned: Natural Airway  Additional Equipment: None  Intra-op Plan:   Post-operative Plan:   Informed Consent: I have reviewed the patients History and Physical, chart, labs and discussed the procedure including the risks, benefits and alternatives for the proposed anesthesia with the patient or authorized representative who has indicated his/her understanding and acceptance.     Dental advisory given  Plan Discussed with: CRNA and Anesthesiologist  Anesthesia Plan Comments: (Discussed risks of anesthesia with patient, including possibility of difficulty with spontaneous ventilation under anesthesia necessitating airway intervention, PONV, and rare risks such as cardiac or respiratory or neurological events, and allergic reactions. Discussed the role of  CRNA in patient's perioperative care. Patient understands.)        Anesthesia Quick Evaluation

## 2023-11-19 NOTE — Progress Notes (Signed)
 Repeated EKG and had Cardiology review and Cardiology states patient remains in atrial flutter and to proceed with planned procedures.

## 2023-11-19 NOTE — Assessment & Plan Note (Addendum)
 Heart rate remained elevated but little improved than before.  Cardiology is on board and patient was started on amiodarone  infusion. Going for TEE followed by DCCV with cardiology today. -Currently on heparin  infusion -Follow-up cardiology recommendations

## 2023-11-19 NOTE — Assessment & Plan Note (Signed)
 No acute concern. -Continuing home Trileptal  and lamotrigine 

## 2023-11-19 NOTE — Plan of Care (Signed)

## 2023-11-19 NOTE — Hospital Course (Addendum)
 Taken from prior notes.  Cody Burns is a 58 y.o. male with medical history significant of  Hypertension, hypothyroidism, Multiple Sclerosis, CAD s/p MI, OSA , CVA,chronic diastolic HF,anxiety, allergies, alcohol use disorder, orthostatic hypotension, Hx of PAC vs artrial flutter and HLD who presented to ED BIB EMS with complaint of sob and chest tightness that started acutely  prior to presentation.  Patient was recently admitted on 5/12-5/16 for diagnosis of Acute on chronic diastolic heart failure with associated acute hypoxemia for which he was treated with iv lasix  with good effect and GDMT optimized.   Patient was found to be in atrial flutter with RVR, rate between 170-230 and hypoxic on room air. Labs with some leukocytosis, mild hypokalemia at 3.3, respiratory viral panel negative.  Chest x-ray with diffuse pulmonary interstitial prominence concerning for pulmonary edema and small pleural effusions.  There was also concern of possible superimposed pneumonia on left.  Cardiology was consulted and patient was given a dose of digoxin  and started on amiodarone  infusion.  Mildly elevated troponin so he was also started on heparin  infusion.  6/27: Heart rate remained elevated in the low 100s, troponin peaked at 212 and now trending down, leukocytosis resolved but all cell line decreased, potassium at 2.5 with magnesium  of 2.2 - Replating potassium and recheck at 1 PM-repeat at 3.3 Cardiology is planning DCCV later today.  Patient was also started on antibiotics for concern of CAP on imaging.  Procalcitonin negative, strep pneumo negative, preliminary blood cultures negative.  Likely just had pulmonary vascular congestion so discontinuing antibiotics.  6/28: Vital and labs stable, now in sinus rhythm s/p DCCV on 11/19/2023. Amiodarone  infusion is being switched with p.o. and also started on Eliquis.  Home aspirin  was discontinued and he will continue with Plavix  and Eliquis.  Cardiology also  started on spironolactone  and Imdur .  Patient will continue with the rest of his home medications and need to have a close follow-up with his providers for further assistance.

## 2023-11-19 NOTE — Progress Notes (Signed)
*  PRELIMINARY RESULTS* Echocardiogram 2D Echocardiogram has been performed.  Floydene Harder 11/19/2023, 11:36 AM

## 2023-11-19 NOTE — Assessment & Plan Note (Signed)
 Potassium at 2.5 with normal magnesium . - Replace potassium and monitor

## 2023-11-19 NOTE — Assessment & Plan Note (Signed)
 Troponin peaked at 212 and then started trending down.  No chest pain.  Likely due to demand ischemia secondary to atrial flutter with RVR. - Currently on heparin  infusion -Continue to monitor -Repeat echocardiogram done-pending results

## 2023-11-19 NOTE — Progress Notes (Signed)
 [facility] CARDIOLOGY PROGRESS NOTE       Patient ID: Cody Burns MRN: [id number] DOB/AGE: 07-19-1965 58 y.o.  Admit date: 11/18/2023 Referring Physician Dr. [facility], [facility] Primary Cardiologist Dr. Florencio (last seen 2019), [facility] Reason for Consultation AoC HFrEF, elevated troponins  HPI: Cody Burns is a 58 y.o. Burns  with a past medical history of chronic HFmrEF, CABG x4 (2015), coronary artery disease s/p DES to SVG to diagonal 1 proximal and SVG to RCA ostial (05/2017), hypertension, hyperlipidemia, hx CVA, multiple sclerosis, hypothyroidism, orthostatic hypotension, alcohol use disorder, frequent falls  who presented to the ED on 11/18/2023 for worsening shortness of breath, lower extremity edema, chest tightness.  Troponins and BNP found to be elevated.  Cardiology was consulted for further evaluation.   Interval History: -Patient seen and examined this AM and laying comfortably in hospital bed at a slight incline. Patient states he feels better this AM and reports SOB ahs improved and denies chest pain or palptations.  -Patients BP stable and HR elevated this AM. Per tele remains in atrial flutter rates 110s -Yesterday UOP 2.1L, with stable renal function. -K 2.5 this AM. Replaced. -Patient remains on 1L with stable SpO2.  -Patient underwent successful TEE/DCCV with Dr. Wilburn (06/27) converted to SR. Patient tolerated procedure well.    Review of systems complete and found to be negative unless listed above    Past Medical History:  Diagnosis Date   Blackout 2018   several episodes with unknown etiology. cause of his ankle break. awaiting neuro workup   CVA (cerebral vascular accident) (HCC)    Hypertension    Hypothyroidism    Multiple sclerosis (HCC) 1990   Myocardial infarct (HCC)    Optic neuritis    x 3. takes steriods and it resolves.   Seizures (HCC)    as a child. diagnosed with MS and once  treated, seizures resolved.   Sleep apnea     Past Surgical History:  Procedure Laterality Date   CORONARY ARTERY BYPASS GRAFT  2015   CORONARY STENT INTERVENTION N/A 06/07/2017   Procedure: CORONARY STENT INTERVENTION;  Surgeon: Florencio Cara BIRCH, MD;  Location: [facility] INVASIVE CV LAB;  Service: Cardiovascular;  Laterality: N/A;   JOINT REPLACEMENT Left 2007   left hip replacement   LEFT HEART CATH AND CORS/GRAFTS ANGIOGRAPHY N/A 06/07/2017   Procedure: LEFT HEART CATH AND CORS/GRAFTS ANGIOGRAPHY;  Surgeon: Hester Wolm PARAS, MD;  Location: [facility] INVASIVE CV LAB;  Service: Cardiovascular;  Laterality: N/A;   ORIF ANKLE FRACTURE Right 09/22/2016   Procedure: OPEN REDUCTION INTERNAL FIXATION (ORIF) ANKLE FRACTURE;  Surgeon: Norleen PARAS Maltos, MD;  Location: [facility] ORS;  Service: Orthopedics;  Laterality: Right;   quadruple cardiac bypass  2015    Medications Prior to Admission  Medication Sig Dispense Refill Last Dose/Taking   Alirocumab 75 MG/ML SOAJ Inject 75 mg into the skin every 14 (fourteen) days.   11/17/2023 Evening   ascorbic acid  (VITAMIN C ) 500 MG tablet Take 1 tablet (500 mg total) by mouth daily.   11/17/2023   aspirin  EC 81 MG tablet Take 81 mg by mouth daily.   11/17/2023 Morning   atorvastatin  (LIPITOR ) 80 MG tablet Take 80 mg by mouth at bedtime.   11/17/2023   buPROPion  (WELLBUTRIN  XL) 300 MG 24 hr tablet Take 300 mg by mouth daily.   11/17/2023   clopidogrel  (PLAVIX ) 75 MG tablet Take 75 mg by mouth daily.   11/17/2023 Morning  cyanocobalamin  1000 MCG tablet Take 1 tablet (1,000 mcg total) by mouth daily.   11/18/2023 Morning   docusate sodium  (COLACE) 50 MG capsule Take 50 mg by mouth 2 (two) times daily.   11/17/2023   empagliflozin  (JARDIANCE ) 10 MG TABS tablet Take 1 tablet (10 mg total) by mouth daily.   11/17/2023   escitalopram  (LEXAPRO ) 20 MG tablet Take 20 mg by mouth daily.   11/17/2023 Morning   ezetimibe  (ZETIA ) 10 MG tablet Take 10 mg by mouth daily.   11/17/2023   folic acid   (FOLVITE ) 400 MCG tablet Take 400 mcg by mouth daily.   11/17/2023   furosemide  (LASIX ) 40 MG tablet Take 1 tablet (40 mg total) by mouth daily.   11/17/2023 Morning   hydrOXYzine  (ATARAX ) 10 MG tablet Take 20 mg by mouth once as needed for anxiety.   11/17/2023   iron  polysaccharides (NIFEREX) 150 MG capsule Take 1 capsule (150 mg total) by mouth daily.   11/17/2023   lamoTRIgine  (LAMICTAL ) 25 MG tablet Take 25 mg by mouth daily.   11/17/2023   montelukast  (SINGULAIR ) 10 MG tablet Take 10 mg by mouth daily.    11/17/2023   OXcarbazepine  (TRILEPTAL ) 150 MG tablet Take 1 tablet by mouth 2 (two) times daily.   11/17/2023   pregabalin  (LYRICA ) 150 MG capsule Take 1 capsule (150 mg total) by mouth 3 (three) times daily. 30 capsule 0 11/17/2023   QUEtiapine  (SEROQUEL ) 100 MG tablet Take 100 mg by mouth at bedtime.   11/17/2023   senna-docusate (SENOKOT-S) 8.6-50 MG tablet Take 2 tablets by mouth 2 (two) times daily as needed.   Unknown   tiZANidine  (ZANAFLEX ) 4 MG tablet Take 4 mg by mouth every 8 (eight) hours as needed for muscle spasms.   11/17/2023 Morning   carvedilol  (COREG ) 6.25 MG tablet Take 1 tablet (6.25 mg total) by mouth 2 (two) times daily with a meal. (Patient not taking: Reported on 11/18/2023)   Not Taking   Social History   Socioeconomic History   Marital status: Divorced    Spouse name: Not on file   Number of children: 1   Years of education: 12   Highest education level: Not on file  Occupational History   Occupation: Disabled  Tobacco Use   Smoking status: Never   Smokeless tobacco: Former    Types: Chew   Tobacco comments:    rarely uses smokeless tobacco  Vaping Use   Vaping status: Never Used  Substance and Sexual Activity   Alcohol use: Yes    Comment: daily   Drug use: No   Sexual activity: Not Currently  Other Topics Concern   Not on file  Social History Narrative   Lives alone   Caffeine use: Soda daily   Right handed   Social Drivers of Health   Financial  Resource Strain: Not on file  Food Insecurity: No Food Insecurity (11/18/2023)   Hunger Vital Sign    Worried About Running Out of Food in the Last Year: Never true    Ran Out of Food in the Last Year: Never true  Transportation Needs: No Transportation Needs (11/18/2023)   PRAPARE - Administrator, Civil Service (Medical): No    Lack of Transportation (Non-Medical): No  Physical Activity: Not on file  Stress: Not on file  Social Connections: Socially Isolated (10/04/2023)   Social Connection and Isolation Panel    Frequency of Communication with Friends and Family: More than three times a week  Frequency of Social Gatherings with Friends and Family: More than three times a week    Attends Religious Services: Never    Database administrator or Organizations: No    Attends Banker Meetings: Never    Marital Status: Divorced  Catering manager Violence: Not At Risk (11/18/2023)   Humiliation, Afraid, Rape, and Kick questionnaire    Fear of Current or Ex-Partner: No    Emotionally Abused: No    Physically Abused: No    Sexually Abused: No    Family History  Problem Relation Age of Onset   Cancer Mother    Other Father    CAD Brother      Vitals:   11/19/23 0544 11/19/23 0726 11/19/23 0924 11/19/23 1214  BP: 112/64 108/67  105/81  Pulse: 78 (!) 117  (!) 115  Resp: 20 18 20  (!) 21  Temp: (!) 97.5 F (36.4 C) 98.3 F (36.8 C)  98.7 F (37.1 C)  TempSrc: Oral   Oral  SpO2: 91% 95%  95%  Weight:      Height:        PHYSICAL EXAM General: Chronically ill-appearing Burns, well nourished, in no acute distress. HEENT: Normocephalic and atraumatic. Neck: No JVD.   Lungs: Normal respiratory effort on 1L (no O2 home requirement).  Bibasilar crackles Heart: HRR, elevated rate. Normal S1 and S2 without gallops or murmurs.  Abdomen: Non-distended appearing.  Msk: Normal strength and tone for age. Extremities: Warm and well perfused. No clubbing, cyanosis.  Trace pedal edema bilaterally.  Neuro: Alert and oriented X 3. Psych: Answers questions appropriately.   Labs: Basic Metabolic Panel: Recent Labs    11/18/23 0636 11/19/23 0457 11/19/23 1143  NA 139 141  --   K 3.3* 2.5* 3.3*  CL 105 104  --   CO2 23 28  --   GLUCOSE 127* 96  --   BUN 11 9  --   CREATININE 1.11 1.13  --   CALCIUM  9.0 8.5*  --   MG  --  2.2  --    Liver Function Tests: Recent Labs    11/19/23 0457  AST 14*  ALT 14  ALKPHOS 62  BILITOT 1.4*  PROT 5.9*  ALBUMIN 3.2*   No results for input(s): LIPASE, AMYLASE in the last 72 hours. CBC: Recent Labs    11/18/23 0636 11/19/23 0457  WBC 15.9* 10.5  NEUTROABS 11.9*  --   HGB 14.3 12.1*  HCT 43.9 36.8*  MCV 89.6 87.6  PLT 353 292   Cardiac Enzymes: Recent Labs    11/18/23 1302 11/18/23 1500 11/19/23 0705  TROPONINIHS 199* 212* 89*   BNP: Recent Labs    11/18/23 0640  BNP 598.8*   D-Dimer: No results for input(s): DDIMER in the last 72 hours. Hemoglobin A1C: No results for input(s): HGBA1C in the last 72 hours. Fasting Lipid Panel: No results for input(s): CHOL, HDL, LDLCALC, TRIG, CHOLHDL, LDLDIRECT in the last 72 hours. Thyroid Function Tests: No results for input(s): TSH, T4TOTAL, T3FREE, THYROIDAB in the last 72 hours.  Invalid input(s): FREET3 Anemia Panel: No results for input(s): VITAMINB12, FOLATE, FERRITIN, TIBC, IRON , RETICCTPCT in the last 72 hours.   Radiology: DG Chest Portable 1 View Result Date: 11/18/2023 CLINICAL DATA:  SOB EXAM: PORTABLE CHEST 1 VIEW COMPARISON:  10/04/2023. FINDINGS: Diffuse pulmonary interstitial prominence consistent with pulmonary edema. Alveolar process left hemithorax could represent a superimposed pneumonia. Prominent cardiac silhouette. Small pleural effusions. No pneumothorax. Median sternotomy and  CABG. IMPRESSION: Findings consistent with CHF. Possible superimposed pneumonia on the left. Electronically  Signed   By: Fonda Field M.D.   On: 11/18/2023 07:24    ECHO pending  TELEMETRY reviewed by me 11/19/2023: Atrial flutter, rate 120s (post-DCCV SR arte 80s)  EKG reviewed by me: Atrial flutter rate 126 bpm without acute ischemic changes.  Data reviewed by me 11/19/2023: last 24h vitals tele labs imaging I/O hospitalist progress notes.  Principal Problem:   CHF (congestive heart failure) (HCC)    ASSESSMENT AND PLAN:  Cody Burns is a 58 y.o. Burns  with a past medical history of chronic HFmrEF, CABG x4 (2015), coronary artery disease s/p DES to SVG to diagonal 1 proximal and SVG to RCA ostial (05/2017), hypertension, hyperlipidemia, hx CVA, multiple sclerosis, hypothyroidism, orthostatic hypotension, alcohol use disorder, frequent falls  who presented to the ED on 11/18/2023 for worsening shortness of breath, lower extremity edema, chest tightness.  Troponins and BNP found to be elevated.  Cardiology was consulted for further evaluation.   # Atrial flutter RVR # History CVA # Frequent falls Patient reports having palpitations for past 3 weeks.  EKG in ED with atrial flutter RVR, rate 126 bpm. Per telemetry remains in atrial flutter with rate 110s.  Of note, patient allergic to metoprolol . Patient underwent successful TEE/DCCV with Dr. Wilburn (06/27) converted to SR. -Monitor and replenish electrolytes for a goal K >4, Mag >2  -Transition IV to PO amio 400 mg BID for 10 days, then 200 mg daily. -CHA2DS2-VASc Score is 5. Recommend anticoagulation stroke risk reduction.  Benefits outweigh the risk. -Transition IV heparin  gtt to PO Eliquis 5 mg BID for stroke risk reduction.   # Acute on chronic HFmrEF (Newly reduced 40-45% - 10/05/2023) # Acute hypoxic respiratory failure Patient presents with worsening shortness of breath and lower extremity edema. Chest x-ray with pulmonary vascular congestion, and possible pneumonia.  BNP elevated at 600. -Echo pending.  Further recommendations  pending. -Continue IV Lasix  40 mg twice daily tomorrow. (Home dose: PO lasix  40 mg daily). Monitor renal function and UOP. -Continue spironolactone  25 mg daily. -Consider resuming losartan  25 mg daily when BP allows. -Hold home Coreg  for now until patient closer to euvolemia. -Resume home empagliflozin  10 mg daily.  # NSTEMI type 1 vs type 2 # History of CABG x 4 (2015) #Coronary artery disease s/p stents x2 (05/2017) # Hypertension # Hyperlipidemia Patient with significant cardiac history, presents with worsening shortness of breath, lower extremity edema, chest tightness.  Currently denies any chest pain.  EKG without acute ischemic changes.  Troponins elevated and trending 60 > 120 > 200.   -Continue Imdur  30 mg daily. -Continue atorvastatin  80 mg, Zetia  10 mg daily. -Continue Plavix  75 mg daily. -Losartan  as stated above. -Beta-blocker stated above.  # Pneumonia CXR with pulmonary vascular congestion and possible PNA. WBC elevated at 15.9 upon admission. On 4L with no home O2 requirement.  -IV antibiotics management per primary team.    This patient's plan of care was discussed and created with Dr. Florencio and he is in agreement.  Signed: Dorene Comfort, PA-C  11/19/2023, 12:37 PM [facility]

## 2023-11-19 NOTE — Anesthesia Postprocedure Evaluation (Signed)
 Anesthesia Post Note  Patient: Cody Burns  Procedure(s) Performed: ECHOCARDIOGRAM, TRANSESOPHAGEAL CARDIOVERSION  Patient location during evaluation: Specials Recovery Anesthesia Type: General Level of consciousness: awake and alert Pain management: pain level controlled Vital Signs Assessment: post-procedure vital signs reviewed and stable Respiratory status: spontaneous breathing, nonlabored ventilation, respiratory function stable and patient connected to nasal cannula oxygen Cardiovascular status: blood pressure returned to baseline and stable Postop Assessment: no apparent nausea or vomiting Anesthetic complications: no   No notable events documented.   Last Vitals:  Vitals:   11/19/23 1306 11/19/23 1312  BP:    Pulse: 87 (P) 90  Resp: (!) 21 (P) 20  Temp:  (P) 37 C  SpO2: 93%     Last Pain:  Vitals:   11/19/23 1214  TempSrc: Oral  PainSc: 0-No pain                 Rome Ade

## 2023-11-19 NOTE — Consult Note (Signed)
 Pharmacy Consult Note - Anticoagulation  Pharmacy Consult for heparin  Indication: atrial flutter  PATIENT MEASUREMENTS: Height: 6' (182.9 cm) Weight: 109 kg (240 lb 4.8 oz) IBW/kg (Calculated) : 77.6 HEPARIN  DW (KG): 101  VITAL SIGNS: Temp: 97.5 F (36.4 C) (06/27 0544) Temp Source: Oral (06/27 0544) BP: 112/64 (06/27 0544) Pulse Rate: 78 (06/27 0544)  Recent Labs    11/18/23 0636 11/18/23 0912 11/18/23 1500 11/18/23 1715 11/18/23 2254 11/19/23 0457  HGB 14.3  --   --   --   --  12.1*  HCT 43.9  --   --   --   --  36.8*  PLT 353  --   --   --   --  292  APTT  --   --   --  140*  --   --   LABPROT  --   --   --  14.8  --   --   INR  --   --   --  1.1  --   --   HEPARINUNFRC  --   --   --   --    < > 0.52  CREATININE 1.11  --   --   --   --   --   TROPONINIHS 69*   < > 212*  --   --   --    < > = values in this interval not displayed.    Estimated Creatinine Clearance: 91.4 mL/min (by C-G formula based on SCr of 1.11 mg/dL).  PAST MEDICAL HISTORY: Past Medical History:  Diagnosis Date   Blackout 2018   several episodes with unknown etiology. cause of his ankle break. awaiting neuro workup   CVA (cerebral vascular accident) (HCC)    Hypertension    Hypothyroidism    Multiple sclerosis (HCC) 1990   Myocardial infarct (HCC)    Optic neuritis    x 3. takes steriods and it resolves.   Seizures (HCC)    as a child. diagnosed with MS and once treated, seizures resolved.   Sleep apnea     ASSESSMENT: 58 y.o. male with PMH including CVA, CHF, HTN, CAD (MI) is presenting with acute on chronic heart failure and atrial flutter. CHA2DS2VASc is at least 5 (CHF, HTN, CVA+2, CAD/MI.) Patient is not on chronic anticoagulation per chart review but does take clopidogrel  and baby aspirin . Pharmacy has been consulted to initiate and manage heparin  intravenous infusion.  Additional baseline anticoagulation labs have been ordered.  Pertinent medications: No chronic  anticoagulation Clopidogrel  75mg  daily Aspirin  81mg  daily  Goal(s) of therapy: Heparin  level 0.3 - 0.7 units/mL Monitor platelets by anticoagulation protocol: Yes   Baseline anticoagulation labs: Recent Labs    11/18/23 0636 11/18/23 1715 11/19/23 0457  APTT  --  140*  --   INR  --  1.1  --   HGB 14.3  --  12.1*  PLT 353  --  292    Date Time aPTT/HL Rate/Comment 06/26 2254 HL 0.63 Therapeutic x 1 06/27 0457 HL 0.52 Therapeutic x 2   PLAN: Continue heparin  infusion at 1500 units/hour. Recheck HL w/ AM labs daily while therapeutic  Monitor CBC daily while on heparin  infusion.  Rankin CANDIE Dills, PharmD, [facility] 11/19/2023 6:32 AM

## 2023-11-19 NOTE — Transfer of Care (Signed)
 Immediate Anesthesia Transfer of Care Note  Patient: Cody Burns  Procedure(s) Performed: ECHOCARDIOGRAM, TRANSESOPHAGEAL CARDIOVERSION  Patient Location: PACU and [facility]  Anesthesia Type: MAC  Level of Consciousness: awake  Airway & Oxygen Therapy: Patient Spontanous Breathing and Patient connected to nasal cannula oxygen  Post-op Assessment: Report given to RN, Post -op Vital signs reviewed and stable, and Patient moving all extremities  Post vital signs: Reviewed and stable  Last Vitals:  Vitals Value Taken Time  BP    Temp    Pulse    Resp    SpO2      Last Pain:  Vitals:   11/19/23 1214  TempSrc: Oral  PainSc: 0-No pain         Complications: No notable events documented.

## 2023-11-19 NOTE — Assessment & Plan Note (Signed)
-   Continue home Wellbutrin , Lexapro , Seroquel  and as needed Atarax

## 2023-11-19 NOTE — Assessment & Plan Note (Addendum)
 Patient with history of HFpEF, chest x-ray concerning for pulmonary congestion. Repeat echocardiogram done-pending results -Continue with IV Lasix  -Daily weight and BMP -Strict intake and output

## 2023-11-19 NOTE — Assessment & Plan Note (Addendum)
 Patient has an history of left MCA stroke.  No new concern or deficit. - Patient was on aspirin  and Plavix  at home which is currently being held and vascular will restart as appropriate after the procedure -Continue with statin -Continue outpatient alirocumab

## 2023-11-19 NOTE — Procedures (Addendum)
Electrical Cardioversion Procedure Note  Indication: Atrial flutter  Procedure Details: Consent: Indication, Risk/benefits of procedure as well as the alternatives explained to patient and informed consent obtained. Time out performed. Verified patient identification, verified procedure, verified correct patient position, special equipment/implants available, medications/allergies/relevent history reviewed, required imaging and test results reviewed.  Deep sedation was provided by anesthesia with propofol. Patient was delivered with 200 Joules of electricity X 1 with success to Sinus rhythm. Patient tolerated the procedure well. No immediate complication noted.   Successful cardioversion  Windell Norfolk, MD [facility]- [facility]

## 2023-11-19 NOTE — Assessment & Plan Note (Signed)
-   Continue with outpatient follow-up 

## 2023-11-20 DIAGNOSIS — R7989 Other specified abnormal findings of blood chemistry: Secondary | ICD-10-CM | POA: Diagnosis not present

## 2023-11-20 DIAGNOSIS — I4892 Unspecified atrial flutter: Secondary | ICD-10-CM | POA: Diagnosis not present

## 2023-11-20 DIAGNOSIS — J9601 Acute respiratory failure with hypoxia: Secondary | ICD-10-CM

## 2023-11-20 DIAGNOSIS — I5033 Acute on chronic diastolic (congestive) heart failure: Secondary | ICD-10-CM | POA: Diagnosis not present

## 2023-11-20 LAB — BASIC METABOLIC PANEL WITH GFR
Anion gap: 9 (ref 5–15)
BUN: 11 mg/dL (ref 6–20)
CO2: 25 mmol/L (ref 22–32)
Calcium: 8.9 mg/dL (ref 8.9–10.3)
Chloride: 103 mmol/L (ref 98–111)
Creatinine, Ser: 1.15 mg/dL (ref 0.61–1.24)
GFR, Estimated: >60 mL/min (ref >60)
Glucose, Bld: 97 mg/dL (ref 70–99)
Potassium: 3.6 mmol/L (ref 3.5–5.1)
Sodium: 137 mmol/L (ref 135–145)

## 2023-11-20 LAB — CBC
HCT: 35.8 % — ABNORMAL LOW (ref 39.0–52.0)
Hemoglobin: 11.7 g/dL — ABNORMAL LOW (ref 13.0–17.0)
MCH: 28.8 pg (ref 26.0–34.0)
MCHC: 32.7 g/dL (ref 30.0–36.0)
MCV: 88.2 fL (ref 80.0–100.0)
Platelets: 288 10*3/uL (ref 150–400)
RBC: 4.06 MIL/uL — ABNORMAL LOW (ref 4.22–5.81)
RDW: 15.5 % (ref 11.5–15.5)
WBC: 9.4 10*3/uL (ref 4.0–10.5)
nRBC: 0 % (ref 0.0–0.2)

## 2023-11-20 MED ORDER — ISOSORBIDE MONONITRATE ER 30 MG PO TB24: 30.0000 mg | ORAL_TABLET | Freq: Every day | ORAL | 1 refills | Status: AC

## 2023-11-20 MED ORDER — FUROSEMIDE 20 MG PO TABS: 20.0000 mg | ORAL_TABLET | Freq: Two times a day (BID) | ORAL | Status: DC

## 2023-11-20 MED ORDER — SPIRONOLACTONE 25 MG PO TABS: 25.0000 mg | ORAL_TABLET | Freq: Every day | ORAL | 1 refills | Status: AC

## 2023-11-20 MED ORDER — AMIODARONE HCL 200 MG PO TABS: ORAL_TABLET | ORAL | 0 refills | Status: AC

## 2023-11-20 MED ORDER — APIXABAN 5 MG PO TABS
5.0000 mg | ORAL_TABLET | Freq: Two times a day (BID) | ORAL | 2 refills | Status: AC
Start: 2023-11-20 — End: ?

## 2023-11-20 MED ORDER — LISINOPRIL 5 MG PO TABS: 2.5000 mg | ORAL_TABLET | Freq: Every day | ORAL | Status: DC

## 2023-11-20 NOTE — Progress Notes (Signed)
 [facility] Cardiology    SUBJECTIVE: States that he feels reasonably well no pain no shortness of breath no palpitations tachycardia   Vitals:   11/19/23 2326 11/20/23 0220 11/20/23 0306 11/20/23 0737  BP: 105/71 110/72  122/87  Pulse: 90 86  84  Resp: 18 18  16   Temp: 98.6 F (37 C) 98.5 F (36.9 C)  98.9 F (37.2 C)  TempSrc:    Oral  SpO2: 95% 94%  95%  Weight:   105.7 kg   Height:         Intake/Output Summary (Last 24 hours) at 11/20/2023 1028 Last data filed at 11/20/2023 9473 Gross per 24 hour  Intake 1020 ml  Output 3650 ml  Net -2630 ml      PHYSICAL EXAM  General: Well developed, well nourished, in no acute distress HEENT:  Normocephalic and atramatic Neck:  No JVD.  Lungs: Clear bilaterally to auscultation and percussion. Heart: HRRR . Normal S1 and S2 without gallops or murmurs.  Abdomen: Bowel sounds are positive, abdomen soft and non-tender  Msk:  Back normal, normal gait. Normal strength and tone for age. Extremities: No clubbing, cyanosis or edema.   Neuro: Generalized weakness secondary to multiple sclerosis Psych:  Good affect, responds appropriately   LABS: Basic Metabolic Panel: Recent Labs    11/19/23 0457 11/19/23 1143 11/20/23 0343  NA 141  --  137  K 2.5* 3.3* 3.6  CL 104  --  103  CO2 28  --  25  GLUCOSE 96  --  97  BUN 9  --  11  CREATININE 1.13  --  1.15  CALCIUM  8.5*  --  8.9  MG 2.2  --   --    Liver Function Tests: Recent Labs    11/19/23 0457  AST 14*  ALT 14  ALKPHOS 62  BILITOT 1.4*  PROT 5.9*  ALBUMIN 3.2*   No results for input(s): LIPASE, AMYLASE in the last 72 hours. CBC: Recent Labs    11/18/23 0636 11/19/23 0457 11/20/23 0343  WBC 15.9* 10.5 9.4  NEUTROABS 11.9*  --   --   HGB 14.3 12.1* 11.7*  HCT 43.9 36.8* 35.8*  MCV 89.6 87.6 88.2  PLT 353 292 288   Cardiac Enzymes: No results for input(s): CKTOTAL, CKMB, CKMBINDEX, TROPONINI in the last 72 hours. BNP: Invalid input(s):  POCBNP D-Dimer: No results for input(s): DDIMER in the last 72 hours. Hemoglobin A1C: No results for input(s): HGBA1C in the last 72 hours. Fasting Lipid Panel: No results for input(s): CHOL, HDL, LDLCALC, TRIG, CHOLHDL, LDLDIRECT in the last 72 hours. Thyroid Function Tests: No results for input(s): TSH, T4TOTAL, T3FREE, THYROIDAB in the last 72 hours.  Invalid input(s): FREET3 Anemia Panel: No results for input(s): VITAMINB12, FOLATE, FERRITIN, TIBC, IRON , RETICCTPCT in the last 72 hours.  ECHOCARDIOGRAM COMPLETE Result Date: 11/19/2023    ECHOCARDIOGRAM REPORT   Patient Name:   Cody Burns Date of Exam: 11/19/2023 Medical Rec #:  [id number]     Height:       72.0 in Accession #:    [id number]    Weight:       240.3 lb Date of Birth:  1966/04/23     BSA:          2.303 m Patient Age:    57 years      BP:           108/67 mmHg Patient Gender: M  HR:           117 bpm. Exam Location:  [facility] Procedure: 2D Echo, Cardiac Doppler and Color Doppler (Both Spectral and Color            Flow Doppler were utilized during procedure). Indications:     CHF-acute systolic I50.21  History:         Patient has prior history of Echocardiogram examinations, most                  recent 10/14/2022. Previous Myocardial Infarction; Risk                  Factors:Hypertension and Sleep Apnea.  Sonographer:     Christopher Furnace Referring Phys:  [id number] DORENE COMFORT Diagnosing Phys: Cara JONETTA Lovelace MD IMPRESSIONS  1. Left ventricular ejection fraction, by estimation, is 35 to 40%. The left ventricle has mild to moderately decreased function. The left ventricle demonstrates global hypokinesis. The left ventricular internal cavity size was mildly dilated. There is moderate left ventricular hypertrophy. Left ventricular diastolic function could not be evaluated.  2. Right ventricular systolic function is low normal. The right ventricular size is mildly enlarged.  3. Left atrial  size was moderately dilated.  4. Right atrial size was mild to moderately dilated.  5. The mitral valve is normal in structure. Mild mitral valve regurgitation.  6. The aortic valve is normal in structure. Aortic valve regurgitation is not visualized. FINDINGS  Left Ventricle: Left ventricular ejection fraction, by estimation, is 35 to 40%. The left ventricle has mild to moderately decreased function. The left ventricle demonstrates global hypokinesis. Strain was performed and the global longitudinal strain is  indeterminate. Global longitudinal strain performed but not reported based on interpreter judgement due to suboptimal tracking. The left ventricular internal cavity size was mildly dilated. There is moderate left ventricular hypertrophy. Left ventricular diastolic function could not be evaluated. Right Ventricle: The right ventricular size is mildly enlarged. No increase in right ventricular wall thickness. Right ventricular systolic function is low normal. Left Atrium: Left atrial size was moderately dilated. Right Atrium: Right atrial size was mild to moderately dilated. Pericardium: There is no evidence of pericardial effusion. Mitral Valve: The mitral valve is normal in structure. Mild mitral valve regurgitation. Tricuspid Valve: The tricuspid valve is normal in structure. Tricuspid valve regurgitation is mild. Aortic Valve: The aortic valve is normal in structure. Aortic valve regurgitation is not visualized. Aortic valve mean gradient measures 2.0 mmHg. Aortic valve peak gradient measures 3.0 mmHg. Aortic valve area, by VTI measures 3.50 cm. Pulmonic Valve: The pulmonic valve was normal in structure. Pulmonic valve regurgitation is not visualized. Aorta: The ascending aorta was not well visualized. IAS/Shunts: No atrial level shunt detected by color flow Doppler. Additional Comments: 3D was performed not requiring image post processing on an independent workstation and was indeterminate.  LEFT  VENTRICLE PLAX 2D LVIDd:         5.30 cm LVIDs:         4.70 cm LV PW:         1.20 cm LV IVS:        1.70 cm LVOT diam:     2.20 cm LV SV:         40 LV SV Index:   17 LVOT Area:     3.80 cm  RIGHT VENTRICLE RV Basal diam:  3.50 cm RV Mid diam:    3.10 cm RV S prime:     8.49  cm/s TAPSE (M-mode): 1.3 cm LEFT ATRIUM             Index        RIGHT ATRIUM           Index LA diam:        5.10 cm 2.21 cm/m   RA Area:     19.00 cm LA Vol (A2C):   42.6 ml 18.49 ml/m  RA Volume:   54.20 ml  23.53 ml/m LA Vol (A4C):   44.0 ml 19.10 ml/m LA Biplane Vol: 47.8 ml 20.75 ml/m  AORTIC VALVE AV Area (Vmax):    2.63 cm AV Area (Vmean):   2.92 cm AV Area (VTI):     3.50 cm AV Vmax:           86.50 cm/s AV Vmean:          59.900 cm/s AV VTI:            0.114 m AV Peak Grad:      3.0 mmHg AV Mean Grad:      2.0 mmHg LVOT Vmax:         59.90 cm/s LVOT Vmean:        46.000 cm/s LVOT VTI:          0.105 m LVOT/AV VTI ratio: 0.92  AORTA Ao Root diam: 2.80 cm MITRAL VALVE                TRICUSPID VALVE MV Area (PHT): 3.72 cm     TR Peak grad:   15.4 mmHg MV Decel Time: 204 msec     TR Vmax:        196.00 cm/s MV E velocity: 113.00 cm/s                             SHUNTS                             Systemic VTI:  0.10 m                             Systemic Diam: 2.20 cm Wyndham Santilli D Armen Waring MD Electronically signed by Cara JONETTA Lovelace MD Signature Date/Time: 11/19/2023/2:05:14 PM    Final      ECHO mild to moderately depressed left ventricular function EF of 35 to 40% TELEMETRY: Normal sinus rhythm rate of 89 interventricular conduction delay  ASSESSMENT AND PLAN:  Principal Problem:   Atrial flutter with rapid ventricular response (HCC) Active Problems:   Multiple sclerosis (HCC)   CHF (congestive heart failure) (HCC)   Elevated troponin   Hypokalemia   History of stroke   Seizures (HCC)   Anxiety and depression    Plan Atrial fibrillation status post TEE cardioversion patient maintained sinus rhythm with  maintain Eliquis for anticoagulation amiodarone  rate control Hyperlipidemia maintain Lipitor  80 mg a day Zetia  10 mg a day Angina patient currently on Imdur  30 Congestive heart failure currently on Jardiance  and spironolactone  Lasix  switched to p.o. 20 mg twice a day History of CVA currently on Plavix  Eliquis Multiple sclerosis continue current therapy follow-up with neurology Patient is cleared for discharge from a cardiac standpoint-follow-up with cardiology 1 to 2 weeks Cardiomyopathy EF of 35 to 40% with recommend spironolactone  and Jardiance  add lisinopril  2.5 daily patient states he has difficulty with metoprolol  because of his multiple  sclerosis  Cara JONETTA Lovelace, MD, 11/20/2023 10:28 AM

## 2023-11-20 NOTE — Discharge Summary (Signed)
 Physician Discharge Summary   Patient: Andreus Cure MRN: [id number] DOB: 06-May-1966  Admit date:     11/18/2023  Discharge date: 11/20/23  Discharge Physician: Amaryllis Dare   PCP: [facility]   Recommendations at discharge:  Please obtain CBC and BMP and follow-up Follow-up with cardiology Follow-up with primary care provider  Discharge Diagnoses: Principal Problem:   Atrial flutter with rapid ventricular response (HCC) Active Problems:   CHF (congestive heart failure) (HCC)   Elevated troponin   Hypokalemia   History of stroke   Anxiety and depression   Multiple sclerosis (HCC)   Seizures [facility])   Hospital Course: Taken from prior notes.  Dreshaun Stene is a 58 y.o. male with medical history significant of  Hypertension, hypothyroidism, Multiple Sclerosis, CAD s/p MI, OSA , CVA,chronic diastolic HF,anxiety, allergies, alcohol use disorder, orthostatic hypotension, Hx of PAC vs artrial flutter and HLD who presented to ED BIB EMS with complaint of sob and chest tightness that started acutely  prior to presentation.  Patient was recently admitted on 5/12-5/16 for diagnosis of Acute on chronic diastolic heart failure with associated acute hypoxemia for which he was treated with iv lasix  with good effect and GDMT optimized.   Patient was found to be in atrial flutter with RVR, rate between 170-230 and hypoxic on room air. Labs with some leukocytosis, mild hypokalemia at 3.3, respiratory viral panel negative.  Chest x-ray with diffuse pulmonary interstitial prominence concerning for pulmonary edema and small pleural effusions.  There was also concern of possible superimposed pneumonia on left.  Cardiology was consulted and patient was given a dose of digoxin  and started on amiodarone  infusion.  Mildly elevated troponin so he was also started on heparin  infusion.  6/27: Heart rate remained elevated in the low 100s, troponin peaked at 212 and now trending down,  leukocytosis resolved but all cell line decreased, potassium at 2.5 with magnesium  of 2.2 - Replating potassium and recheck at 1 PM-repeat at 3.3 Cardiology is planning DCCV later today.  Patient was also started on antibiotics for concern of CAP on imaging.  Procalcitonin negative, strep pneumo negative, preliminary blood cultures negative.  Likely just had pulmonary vascular congestion so discontinuing antibiotics.  6/28: Vital and labs stable, now in sinus rhythm s/p DCCV on 11/19/2023. Amiodarone  infusion is being switched with p.o. and also started on Eliquis.  Home aspirin  was discontinued and he will continue with Plavix  and Eliquis.  Cardiology also started on spironolactone  and Imdur .  Patient will continue with the rest of his home medications and need to have a close follow-up with his providers for further assistance.  Assessment and Plan: * Atrial flutter with rapid ventricular response (HCC) S/p TEE and DCCV.  Converted to sinus rhythm. Cardiology switched amiodarone  infusion with p.o. which he will continue on discharge.  Also started on Eliquis.  Home aspirin  was discontinued and he will continue with Plavix  and Eliquis.  Will follow-up with cardiology for further assistance  CHF (congestive heart failure) (HCC) Patient with history of HFpEF, chest x-ray concerning for pulmonary congestion. Repeat echocardiogram with new diagnosis of HFrEF likely due to atrial flutter.  Cardiology added spironolactone  and he will continue the rest of his home medication and follow-up closely with cardiology for further assistance.  Elevated troponin Troponin peaked at 212 and then started trending down.  No chest pain.  Likely due to demand ischemia secondary to atrial flutter with RVR. Repeat echocardiogram with new diagnosis of HFrEF.  No focal wall motion abnormalities.  Hypokalemia Resolved  History of stroke Patient has an history of left MCA stroke.  No new concern or deficit. -  Patient was on aspirin  and Plavix  at home , home aspirin  was discontinued and he will continue with Plavix  with Eliquis -Continue with statin -Continue outpatient alirocumab  Anxiety and depression - Continue home Wellbutrin , Lexapro , Seroquel  and as needed Atarax   Multiple sclerosis (HCC) - Continue with outpatient follow-up  Seizures (HCC) No acute concern. -Continuing home medications and follow-up with his neurologist.  Consultants: Cardiology Procedures performed: TEE and DCCV Disposition: Home Diet recommendation:  Discharge Diet Orders (From admission, onward)     Start     Ordered   11/20/23 0000  Diet - low sodium heart healthy        11/20/23 1342           Cardiac diet DISCHARGE MEDICATION: Allergies as of 11/20/2023       Reactions   Hydrochlorothiazide-triamterene Other (See Comments), Palpitations, Hypertension   High blood pressure   Metoprolol  Other (See Comments), Palpitations   Exacerbates MS, High blood pressure   Penicillins Other (See Comments)   Sores in mouth Has patient had a PCN reaction causing immediate rash, facial/tongue/throat swelling, SOB or lightheadedness with hypotension: No Has patient had a PCN reaction causing severe rash involving mucus membranes or skin necrosis: No Has patient had a PCN reaction that required hospitalization: No Has patient had a PCN reaction occurring within the last 10 years: No If all of the above answers are NO, then may proceed with Cephalosporin use. MOUTH SORES   Aripiprazole Other (See Comments)   WORSENED SLEEP WALKING        Medication List     STOP taking these medications    aspirin  EC 81 MG tablet   carvedilol  6.25 MG tablet Commonly known as: COREG        TAKE these medications    Alirocumab 75 MG/ML Soaj Inject 75 mg into the skin every 14 (fourteen) days.   amiodarone  200 MG tablet Commonly known as: PACERONE  Take 2 tablets (400 mg total) by mouth 2 (two) times daily for 7  days, THEN 1 tablet (200 mg total) daily. Start taking on: November 20, 2023   apixaban 5 MG Tabs tablet Commonly known as: ELIQUIS Take 1 tablet (5 mg total) by mouth 2 (two) times daily.   ascorbic acid  500 MG tablet Commonly known as: VITAMIN C  Take 1 tablet (500 mg total) by mouth daily.   atorvastatin  80 MG tablet Commonly known as: LIPITOR  Take 80 mg by mouth at bedtime.   buPROPion  300 MG 24 hr tablet Commonly known as: WELLBUTRIN  XL Take 300 mg by mouth daily.   clopidogrel  75 MG tablet Commonly known as: PLAVIX  Take 75 mg by mouth daily.   cyanocobalamin  1000 MCG tablet Take 1 tablet (1,000 mcg total) by mouth daily.   docusate sodium  50 MG capsule Commonly known as: COLACE Take 50 mg by mouth 2 (two) times daily.   empagliflozin  10 MG Tabs tablet Commonly known as: JARDIANCE  Take 1 tablet (10 mg total) by mouth daily.   escitalopram  20 MG tablet Commonly known as: LEXAPRO  Take 20 mg by mouth daily.   ezetimibe  10 MG tablet Commonly known as: ZETIA  Take 10 mg by mouth daily.   folic acid  400 MCG tablet Commonly known as: FOLVITE  Take 400 mcg by mouth daily.   furosemide  40 MG tablet Commonly known as: LASIX  Take 1 tablet (40 mg total) by mouth daily.  hydrOXYzine  10 MG tablet Commonly known as: ATARAX  Take 20 mg by mouth once as needed for anxiety.   iron  polysaccharides 150 MG capsule Commonly known as: NIFEREX Take 1 capsule (150 mg total) by mouth daily.   isosorbide  mononitrate 30 MG 24 hr tablet Commonly known as: IMDUR  Take 1 tablet (30 mg total) by mouth daily. Start taking on: November 21, 2023   lamoTRIgine  25 MG tablet Commonly known as: LAMICTAL  Take 25 mg by mouth daily.   montelukast  10 MG tablet Commonly known as: SINGULAIR  Take 10 mg by mouth daily.   OXcarbazepine  150 MG tablet Commonly known as: TRILEPTAL  Take 1 tablet by mouth 2 (two) times daily.   pregabalin  150 MG capsule Commonly known as: LYRICA  Take 1 capsule (150  mg total) by mouth 3 (three) times daily.   QUEtiapine  100 MG tablet Commonly known as: SEROQUEL  Take 100 mg by mouth at bedtime.   senna-docusate 8.6-50 MG tablet Commonly known as: Senokot-S Take 2 tablets by mouth 2 (two) times daily as needed.   spironolactone  25 MG tablet Commonly known as: ALDACTONE  Take 1 tablet (25 mg total) by mouth daily. Start taking on: November 21, 2023   tiZANidine  4 MG tablet Commonly known as: ZANAFLEX  Take 4 mg by mouth every 8 (eight) hours as needed for muscle spasms.        Follow-up Information     Olanta, Caralyn, PA-C. Go in 1 week(s).   Specialty: Cardiology Why: Appointment scheduled for [facility] on 7/2 at 9 AM Contact information: [address] [place] [address] [phone]         Florencio Cara BIRCH, MD. Go in 2 week(s).   Specialties: Cardiology, Internal Medicine Contact information: [address] [place] [address] [phone]         [facility]. Schedule an appointment as soon as possible for a visit in 1 week(s).   Specialty: General Practice Contact information: [address] [place] [address] [phone]                Discharge Exam: Fredricka Weights   11/18/23 9366 11/19/23 0500 11/20/23 0306  Weight: 110.2 kg 109 kg 105.7 kg   General.  Overweight gentleman, in no acute distress. Pulmonary.  Lungs clear bilaterally, normal respiratory effort. CV.  Regular rate and rhythm, no JVD, rub or murmur. Abdomen.  Soft, nontender, nondistended, BS positive. CNS.  Alert and oriented .  No focal neurologic deficit. Extremities.  No edema, no cyanosis, pulses intact and symmetrical. Psychiatry.  Judgment and insight appears normal.   Condition at discharge: stable  The results of significant diagnostics from this hospitalization (including imaging, microbiology, ancillary and laboratory) are listed below for reference.   Imaging  Studies: ECHOCARDIOGRAM COMPLETE Result Date: 11/19/2023    ECHOCARDIOGRAM REPORT   Patient Name:   BOBAK OGUINN Date of Exam: 11/19/2023 Medical Rec #:  [id number]     Height:       72.0 in Accession #:    [id number]    Weight:       240.3 lb Date of Birth:  12/02/1965     BSA:          2.303 m Patient Age:    57 years      BP:           108/67 mmHg Patient Gender: M             HR:  117 bpm. Exam Location:  [facility] Procedure: 2D Echo, Cardiac Doppler and Color Doppler (Both Spectral and Color            Flow Doppler were utilized during procedure). Indications:     CHF-acute systolic I50.21  History:         Patient has prior history of Echocardiogram examinations, most                  recent 10/14/2022. Previous Myocardial Infarction; Risk                  Factors:Hypertension and Sleep Apnea.  Sonographer:     Christopher Furnace Referring Phys:  [id number] DORENE COMFORT Diagnosing Phys: Cara JONETTA Lovelace MD IMPRESSIONS  1. Left ventricular ejection fraction, by estimation, is 35 to 40%. The left ventricle has mild to moderately decreased function. The left ventricle demonstrates global hypokinesis. The left ventricular internal cavity size was mildly dilated. There is moderate left ventricular hypertrophy. Left ventricular diastolic function could not be evaluated.  2. Right ventricular systolic function is low normal. The right ventricular size is mildly enlarged.  3. Left atrial size was moderately dilated.  4. Right atrial size was mild to moderately dilated.  5. The mitral valve is normal in structure. Mild mitral valve regurgitation.  6. The aortic valve is normal in structure. Aortic valve regurgitation is not visualized. FINDINGS  Left Ventricle: Left ventricular ejection fraction, by estimation, is 35 to 40%. The left ventricle has mild to moderately decreased function. The left ventricle demonstrates global hypokinesis. Strain was performed and the global longitudinal strain is  indeterminate. Global  longitudinal strain performed but not reported based on interpreter judgement due to suboptimal tracking. The left ventricular internal cavity size was mildly dilated. There is moderate left ventricular hypertrophy. Left ventricular diastolic function could not be evaluated. Right Ventricle: The right ventricular size is mildly enlarged. No increase in right ventricular wall thickness. Right ventricular systolic function is low normal. Left Atrium: Left atrial size was moderately dilated. Right Atrium: Right atrial size was mild to moderately dilated. Pericardium: There is no evidence of pericardial effusion. Mitral Valve: The mitral valve is normal in structure. Mild mitral valve regurgitation. Tricuspid Valve: The tricuspid valve is normal in structure. Tricuspid valve regurgitation is mild. Aortic Valve: The aortic valve is normal in structure. Aortic valve regurgitation is not visualized. Aortic valve mean gradient measures 2.0 mmHg. Aortic valve peak gradient measures 3.0 mmHg. Aortic valve area, by VTI measures 3.50 cm. Pulmonic Valve: The pulmonic valve was normal in structure. Pulmonic valve regurgitation is not visualized. Aorta: The ascending aorta was not well visualized. IAS/Shunts: No atrial level shunt detected by color flow Doppler. Additional Comments: 3D was performed not requiring image post processing on an independent workstation and was indeterminate.  LEFT VENTRICLE PLAX 2D LVIDd:         5.30 cm LVIDs:         4.70 cm LV PW:         1.20 cm LV IVS:        1.70 cm LVOT diam:     2.20 cm LV SV:         40 LV SV Index:   17 LVOT Area:     3.80 cm  RIGHT VENTRICLE RV Basal diam:  3.50 cm RV Mid diam:    3.10 cm RV S prime:     8.49 cm/s TAPSE (M-mode): 1.3 cm LEFT ATRIUM  Index        RIGHT ATRIUM           Index LA diam:        5.10 cm 2.21 cm/m   RA Area:     19.00 cm LA Vol (A2C):   42.6 ml 18.49 ml/m  RA Volume:   54.20 ml  23.53 ml/m LA Vol (A4C):   44.0 ml 19.10 ml/m LA  Biplane Vol: 47.8 ml 20.75 ml/m  AORTIC VALVE AV Area (Vmax):    2.63 cm AV Area (Vmean):   2.92 cm AV Area (VTI):     3.50 cm AV Vmax:           86.50 cm/s AV Vmean:          59.900 cm/s AV VTI:            0.114 m AV Peak Grad:      3.0 mmHg AV Mean Grad:      2.0 mmHg LVOT Vmax:         59.90 cm/s LVOT Vmean:        46.000 cm/s LVOT VTI:          0.105 m LVOT/AV VTI ratio: 0.92  AORTA Ao Root diam: 2.80 cm MITRAL VALVE                TRICUSPID VALVE MV Area (PHT): 3.72 cm     TR Peak grad:   15.4 mmHg MV Decel Time: 204 msec     TR Vmax:        196.00 cm/s MV E velocity: 113.00 cm/s                             SHUNTS                             Systemic VTI:  0.10 m                             Systemic Diam: 2.20 cm Cara JONETTA Lovelace MD Electronically signed by Cara JONETTA Lovelace MD Signature Date/Time: 11/19/2023/2:05:14 PM    Final    DG Chest Portable 1 View Result Date: 11/18/2023 CLINICAL DATA:  SOB EXAM: PORTABLE CHEST 1 VIEW COMPARISON:  10/04/2023. FINDINGS: Diffuse pulmonary interstitial prominence consistent with pulmonary edema. Alveolar process left hemithorax could represent a superimposed pneumonia. Prominent cardiac silhouette. Small pleural effusions. No pneumothorax. Median sternotomy and CABG. IMPRESSION: Findings consistent with CHF. Possible superimposed pneumonia on the left. Electronically Signed   By: Fonda Field M.D.   On: 11/18/2023 07:24    Microbiology: Results for orders placed or performed during the hospital encounter of 11/18/23  Resp panel by RT-PCR (RSV, Flu A&B, Covid) Anterior Nasal Swab     Status: None   Collection Time: 11/18/23  6:36 AM   Specimen: Anterior Nasal Swab  Result Value Ref Range Status   SARS Coronavirus 2 by RT PCR NEGATIVE NEGATIVE Final    Comment: (NOTE) SARS-CoV-2 target nucleic acids are NOT DETECTED.  The SARS-CoV-2 RNA is generally detectable in upper respiratory specimens during the acute phase of infection. The  lowest concentration of SARS-CoV-2 viral copies this assay can detect is 138 copies/mL. A negative result does not preclude SARS-Cov-2 infection and should not be used as the sole basis for treatment or other patient management decisions.  A negative result may occur with  improper specimen collection/handling, submission of specimen other than nasopharyngeal swab, presence of viral mutation(s) within the areas targeted by this assay, and inadequate number of viral copies(<138 copies/mL). A negative result must be combined with clinical observations, patient history, and epidemiological information. The expected result is Negative.  Fact Sheet for Patients:  BloggerCourse.com  Fact Sheet for Healthcare Providers:  SeriousBroker.it  This test is no t yet approved or cleared by the [place]  FDA and  has been authorized for detection and/or diagnosis of SARS-CoV-2 by FDA under an Emergency Use Authorization (EUA). This EUA will remain  in effect (meaning this test can be used) for the duration of the COVID-19 declaration under Section 564(b)(1) of the Act, 21 U.S.C.section 360bbb-3(b)(1), unless the authorization is terminated  or revoked sooner.       Influenza A by PCR NEGATIVE NEGATIVE Final   Influenza B by PCR NEGATIVE NEGATIVE Final    Comment: (NOTE) The Xpert Xpress SARS-CoV-2/FLU/RSV plus assay is intended as an aid in the diagnosis of influenza from Nasopharyngeal swab specimens and should not be used as a sole basis for treatment. Nasal washings and aspirates are unacceptable for Xpert Xpress SARS-CoV-2/FLU/RSV testing.  Fact Sheet for Patients: BloggerCourse.com  Fact Sheet for Healthcare Providers: SeriousBroker.it  This test is not yet approved or cleared by the [place]  FDA and has been authorized for detection and/or diagnosis of SARS-CoV-2 by FDA under  an Emergency Use Authorization (EUA). This EUA will remain in effect (meaning this test can be used) for the duration of the COVID-19 declaration under Section 564(b)(1) of the Act, 21 U.S.C. section 360bbb-3(b)(1), unless the authorization is terminated or revoked.     Resp Syncytial Virus by PCR NEGATIVE NEGATIVE Final    Comment: (NOTE) Fact Sheet for Patients: BloggerCourse.com  Fact Sheet for Healthcare Providers: SeriousBroker.it  This test is not yet approved or cleared by the [place]  FDA and has been authorized for detection and/or diagnosis of SARS-CoV-2 by FDA under an Emergency Use Authorization (EUA). This EUA will remain in effect (meaning this test can be used) for the duration of the COVID-19 declaration under Section 564(b)(1) of the Act, 21 U.S.C. section 360bbb-3(b)(1), unless the authorization is terminated or revoked.  Performed at [facility], [address] [place], [place] [address]   Blood culture (routine x 2)     Status: None (Preliminary result)   Collection Time: 11/18/23  9:06 AM   Specimen: BLOOD RIGHT ARM  Result Value Ref Range Status   Specimen Description BLOOD RIGHT ARM  Final   Special Requests   Final    BOTTLES DRAWN AEROBIC AND ANAEROBIC Blood Culture results may not be optimal due to an inadequate volume of blood received in culture bottles   Culture   Final    NO GROWTH 2 DAYS Performed at [facility], [address] [place], [place] [address]    Report Status PENDING  Incomplete  Blood culture (routine x 2)     Status: None (Preliminary result)   Collection Time: 11/18/23  9:10 AM   Specimen: BLOOD RIGHT ARM  Result Value Ref Range Status   Specimen Description BLOOD RIGHT ARM  Final   Special Requests   Final    BOTTLES DRAWN AEROBIC AND ANAEROBIC Blood Culture results may not be optimal due to an inadequate volume of blood received in culture bottles    Culture   Final    NO GROWTH  2 DAYS Performed at [facility], [address] [place], [place] [address]    Report Status PENDING  Incomplete    Labs: CBC: Recent Labs  Lab 11/18/23 0636 11/19/23 0457 11/20/23 0343  WBC 15.9* 10.5 9.4  NEUTROABS 11.9*  --   --   HGB 14.3 12.1* 11.7*  HCT 43.9 36.8* 35.8*  MCV 89.6 87.6 88.2  PLT 353 292 288   Basic Metabolic Panel: Recent Labs  Lab 11/18/23 0636 11/19/23 0457 11/19/23 1143 11/20/23 0343  NA 139 141  --  137  K 3.3* 2.5* 3.3* 3.6  CL 105 104  --  103  CO2 23 28  --  25  GLUCOSE 127* 96  --  97  BUN 11 9  --  11  CREATININE 1.11 1.13  --  1.15  CALCIUM  9.0 8.5*  --  8.9  MG  --  2.2  --   --    Liver Function Tests: Recent Labs  Lab 11/19/23 0457  AST 14*  ALT 14  ALKPHOS 62  BILITOT 1.4*  PROT 5.9*  ALBUMIN 3.2*   CBG: No results for input(s): GLUCAP in the last 168 hours.  Discharge time spent: greater than 30 minutes.  This record has been created using Conservation officer, historic buildings. Errors have been sought and corrected,but may not always be located. Such creation errors do not reflect on the standard of care.   Signed: Amaryllis Dare, MD [facility] 11/20/2023

## 2023-11-20 NOTE — Plan of Care (Signed)

## 2023-11-20 NOTE — Care Management Important Message (Signed)
 Important Message  Patient Details  Name: Cody Burns MRN: [id number] Date of Birth: 1965/10/20   Important Message Given:  Yes - Medicare IM     [name] W, CMA 11/20/2023, 11:41 AM

## 2023-11-22 ENCOUNTER — Encounter: Payer: Self-pay | Admitting: Cardiology

## 2023-11-22 LAB — ECHO TEE

## 2023-11-23 LAB — CULTURE, BLOOD (ROUTINE X 2)
Culture: NO GROWTH
Culture: NO GROWTH

## 2024-01-17 ENCOUNTER — Emergency Department

## 2024-01-17 ENCOUNTER — Emergency Department: Admission: EM | Admit: 2024-01-17 | Discharge: 2024-01-18 | Disposition: A | Discharge status: Home / Self Care

## 2024-01-17 ENCOUNTER — Other Ambulatory Visit: Payer: Self-pay

## 2024-01-17 DIAGNOSIS — I4892 Unspecified atrial flutter: Secondary | ICD-10-CM | POA: Diagnosis not present

## 2024-01-17 DIAGNOSIS — L03115 Cellulitis of right lower limb: Secondary | ICD-10-CM | POA: Insufficient documentation

## 2024-01-17 DIAGNOSIS — R0609 Other forms of dyspnea: Secondary | ICD-10-CM | POA: Diagnosis not present

## 2024-01-17 DIAGNOSIS — Z7901 Long term (current) use of anticoagulants: Secondary | ICD-10-CM | POA: Diagnosis not present

## 2024-01-17 DIAGNOSIS — I110 Hypertensive heart disease with heart failure: Secondary | ICD-10-CM | POA: Insufficient documentation

## 2024-01-17 DIAGNOSIS — M7989 Other specified soft tissue disorders: Secondary | ICD-10-CM | POA: Diagnosis present

## 2024-01-17 DIAGNOSIS — R609 Edema, unspecified: Secondary | ICD-10-CM | POA: Diagnosis not present

## 2024-01-17 DIAGNOSIS — Z955 Presence of coronary angioplasty implant and graft: Secondary | ICD-10-CM | POA: Diagnosis not present

## 2024-01-17 DIAGNOSIS — I509 Heart failure, unspecified: Secondary | ICD-10-CM | POA: Insufficient documentation

## 2024-01-17 DIAGNOSIS — Z79899 Other long term (current) drug therapy: Secondary | ICD-10-CM | POA: Insufficient documentation

## 2024-01-17 LAB — CBC
HCT: 45.7 % (ref 39.0–52.0)
Hemoglobin: 14.9 g/dL (ref 13.0–17.0)
MCH: 28.9 pg (ref 26.0–34.0)
MCHC: 32.6 g/dL (ref 30.0–36.0)
MCV: 88.6 fL (ref 80.0–100.0)
Platelets: 317 K/uL (ref 150–400)
RBC: 5.16 MIL/uL (ref 4.22–5.81)
RDW: 16.4 % — ABNORMAL HIGH (ref 11.5–15.5)
WBC: 8 K/uL (ref 4.0–10.5)
nRBC: 0 % (ref 0.0–0.2)

## 2024-01-17 LAB — BASIC METABOLIC PANEL WITH GFR
Anion gap: 12 (ref 5–15)
BUN: 8 mg/dL (ref 6–20)
CO2: 28 mmol/L (ref 22–32)
Calcium: 9.8 mg/dL (ref 8.9–10.3)
Chloride: 101 mmol/L (ref 98–111)
Creatinine, Ser: 1.37 mg/dL — ABNORMAL HIGH (ref 0.61–1.24)
GFR, Estimated: 59 mL/min — ABNORMAL LOW (ref >60)
Glucose, Bld: 92 mg/dL (ref 70–99)
Potassium: 3.9 mmol/L (ref 3.5–5.1)
Sodium: 141 mmol/L (ref 135–145)

## 2024-01-17 LAB — LACTIC ACID, PLASMA: Lactic Acid, Venous: 1.2 mmol/L (ref 0.5–1.9)

## 2024-01-17 LAB — TROPONIN I (HIGH SENSITIVITY)
Troponin I (High Sensitivity): 40 ng/L — ABNORMAL HIGH (ref <18)
Troponin I (High Sensitivity): 32 ng/L — ABNORMAL HIGH (ref <18)

## 2024-01-17 MED ORDER — AMIODARONE HCL 200 MG PO TABS
200.0000 mg | ORAL_TABLET | Freq: Every day | ORAL | Status: DC
  Administered 2024-01-17 – 2024-01-18 (×2): 200 mg via ORAL
  Filled 2024-01-17 (×2): qty 1

## 2024-01-17 MED ORDER — CARVEDILOL 6.25 MG PO TABS
3.1250 mg | ORAL_TABLET | Freq: Once | ORAL | Status: AC
  Administered 2024-01-17: 3.125 mg via ORAL
  Filled 2024-01-17: qty 1

## 2024-01-17 MED ORDER — SODIUM CHLORIDE 0.9 % IV BOLUS
500.0000 mL | Freq: Once | INTRAVENOUS | Status: AC
  Administered 2024-01-17: 500 mL via INTRAVENOUS

## 2024-01-17 MED ORDER — CEPHALEXIN 500 MG PO CAPS
500.0000 mg | ORAL_CAPSULE | Freq: Once | ORAL | Status: AC
  Administered 2024-01-17: 500 mg via ORAL
  Filled 2024-01-17: qty 1

## 2024-01-17 MED ORDER — IOHEXOL 350 MG/ML SOLN
100.0000 mL | Freq: Once | INTRAVENOUS | Status: AC | PRN
  Administered 2024-01-17: 100 mL via INTRAVENOUS

## 2024-01-17 MED ORDER — CEPHALEXIN 500 MG PO CAPS: 500.0000 mg | ORAL_CAPSULE | Freq: Four times a day (QID) | ORAL | 0 refills | Status: AC

## 2024-01-17 NOTE — ED Provider Notes (Signed)
 [facility] Provider Note    Event Date/Time   First MD Initiated Contact with Patient 01/17/24 1622     (approximate)   History   Fall and Leg Swelling   HPI  Cody Burns is a 58 y.o. male  with a past medical history of multiple sclerosis, HFpEF, seizures, CVA, MI, CABG x4 (2015), a-fib, a-flutter on Eliquis , aspirin , Plavix  presents to the emergency department with right lower leg swelling, erythema, pain and warmth. Patient states he did have a fall 2 days ago while going to the bathroom, but his leg has been swollen before the incident. Denies hitting his head, headache, nausea, abdominal pain, chest pain, shortness of breath, fever, cough. Patient has also had 4 episodes of vomiting without hematemesis this mornings. Has had some episodes of DOE the past few weeks. Patient does not smoke.  He does have history of alcohol dependence and multiple falls. Patient reports he has not been taking his amiodarone  since he was discharged on 11/20/23 after admission for A-Fib w/ RVR.    Physical Exam   Triage Vital Signs: ED Triage Vitals [01/17/24 1540]  Encounter Vitals Group     BP (!) 150/87     Girls Systolic BP Percentile      Girls Diastolic BP Percentile      Boys Systolic BP Percentile      Boys Diastolic BP Percentile      Pulse Rate (!) 120     Resp 18     Temp 98 F (36.7 C)     Temp src      SpO2 96 %     Weight      Height      Head Circumference      Peak Flow      Pain Score      Pain Loc      Pain Education      Exclude from Growth Chart     Most recent vital signs: Vitals:   01/17/24 1933 01/17/24 1935  BP: (!) 144/88   Pulse: 99 (!) 117  Resp: 20   Temp: (!) 97.5 F (36.4 C)   SpO2: 100% 100%    General: Awake, in no acute distress. Appears stated age. Head: Normocephalic, atraumatic. Neck: Supple, no lymphadenopathy. CV: Tachycardic, 120 bpm. Good peripheral perfusion.  Respiratory:Normal respiratory effort.  No  respiratory distress. CTAB. GI: Soft, non-distended, non-tender.  MSK: Normal ROM and  5/5 strength in b/l knees and ankles. No TTP of right lower leg. Skin:Warm, dry, intact. Right lower leg nonpitting edema and erythema from knee down to foot. Neurological: A&Ox4. No focal deficits.   ED Results / Procedures / Treatments   Labs (all labs ordered are listed, but only abnormal results are displayed) Labs Reviewed  BASIC METABOLIC PANEL WITH GFR - Abnormal; Notable for the following components:      Result Value   Creatinine, Ser 1.37 (*)    GFR, Estimated 59 (*)    All other components within normal limits  CBC - Abnormal; Notable for the following components:   RDW 16.4 (*)    All other components within normal limits  TROPONIN I (HIGH SENSITIVITY) - Abnormal; Notable for the following components:   Troponin I (High Sensitivity) 40 (*)    All other components within normal limits  CULTURE, BLOOD (SINGLE)  LACTIC ACID, PLASMA  BRAIN NATRIURETIC PEPTIDE  LACTIC ACID, PLASMA  TROPONIN I (HIGH SENSITIVITY)     EKG  ***  RADIOLOGY ***   PROCEDURES:  Critical Care performed: {CriticalCareYesNo:19197::Yes, see critical care procedure note(s),No} Med Data Critical Care [email or web address] ***  Procedures   MEDICATIONS ORDERED IN ED: Medications  amiodarone  (PACERONE ) tablet 200 mg (200 mg Oral Given 01/17/24 1936)  sodium chloride  0.9 % bolus 500 mL (0 mLs Intravenous Stopped 01/17/24 1936)     IMPRESSION / MDM / ASSESSMENT AND PLAN / ED COURSE  I reviewed the triage vital signs and the nursing notes.                              Differential diagnosis includes, but is not limited to, ***  Patient's presentation is most consistent with {EM COPA:27473}  *** I independently viewed the x-ray*** and radiologist's report.  I agree with the radiologist's report ***.  {If the patient is on the monitor, remove the brackets and asterisks on the sentence below and remember  to document it as a Procedure as well. Otherwise delete the sentence below:1} {**The patient is on the cardiac monitor to evaluate for evidence of arrhythmia and/or significant heart rate changes.**} {Remember to include, when applicable, any/all of the following data: independent review of imaging independent review of labs (comment specifically on pertinent positives and negatives) review of specific prior hospitalizations, PCP/specialist notes, etc. discuss meds given and prescribed document any discussion with consultants (including hospitalists) any clinical decision tools you used and why (PECARN, NEXUS, etc.) did you consider admitting the patient? document social determinants of health affecting patient's care (homelessness, inability to follow up in a timely fashion, etc) document any pre-existing conditions increasing risk on current visit (e.g. diabetes and HTN increasing danger of high-risk chest pain/ACS) describes what meds you gave (especially parenteral) and why any other interventions?:1}     FINAL CLINICAL IMPRESSION(S) / ED DIAGNOSES   Final diagnoses:  None     Rx / DC Orders   ED Discharge Orders     None        Note:  This document was prepared using Dragon voice recognition software and may include unintentional dictation errors.

## 2024-01-17 NOTE — ED Triage Notes (Signed)
 Pt comes via EMS from home with possible right lower leg fracture per caregiver. Pt does have swelling and warmth touch. Pt has hx of MS>  pt did fall on Saturday but leg had been swollen before that.

## 2024-01-18 LAB — BRAIN NATRIURETIC PEPTIDE: B Natriuretic Peptide: 193.1 pg/mL — ABNORMAL HIGH (ref 0.0–100.0)

## 2024-01-18 MED ORDER — LAMOTRIGINE 25 MG PO TABS
25.0000 mg | ORAL_TABLET | Freq: Every day | ORAL | Status: DC
  Administered 2024-01-18: 25 mg via ORAL
  Filled 2024-01-18: qty 1

## 2024-01-18 MED ORDER — QUETIAPINE FUMARATE 25 MG PO TABS
100.0000 mg | ORAL_TABLET | Freq: Every day | ORAL | Status: DC
  Administered 2024-01-18: 100 mg via ORAL
  Filled 2024-01-18: qty 4

## 2024-01-18 MED ORDER — DOXYCYCLINE HYCLATE 100 MG PO TABS: 100.0000 mg | ORAL_TABLET | Freq: Two times a day (BID) | ORAL | 0 refills | Status: AC

## 2024-01-18 MED ORDER — OXCARBAZEPINE 150 MG PO TABS
150.0000 mg | ORAL_TABLET | Freq: Two times a day (BID) | ORAL | Status: DC
  Administered 2024-01-18 (×2): 150 mg via ORAL
  Filled 2024-01-18 (×2): qty 1

## 2024-01-18 MED ORDER — BUPROPION HCL ER (XL) 150 MG PO TB24
300.0000 mg | ORAL_TABLET | Freq: Every day | ORAL | Status: DC
  Administered 2024-01-18: 300 mg via ORAL
  Filled 2024-01-18: qty 2

## 2024-01-18 MED ORDER — PREGABALIN 75 MG PO CAPS
150.0000 mg | ORAL_CAPSULE | Freq: Three times a day (TID) | ORAL | Status: DC
  Administered 2024-01-18: 150 mg via ORAL
  Filled 2024-01-18: qty 2

## 2024-01-18 MED ORDER — AMIODARONE HCL 200 MG PO TABS
200.0000 mg | ORAL_TABLET | Freq: Every day | ORAL | Status: DC
  Filled 2024-01-18: qty 1

## 2024-01-18 MED ORDER — ISOSORBIDE MONONITRATE ER 60 MG PO TB24
30.0000 mg | ORAL_TABLET | Freq: Every day | ORAL | Status: DC
  Administered 2024-01-18: 30 mg via ORAL
  Filled 2024-01-18: qty 1

## 2024-01-18 MED ORDER — CLOPIDOGREL BISULFATE 75 MG PO TABS
75.0000 mg | ORAL_TABLET | Freq: Every day | ORAL | Status: DC
  Administered 2024-01-18: 75 mg via ORAL
  Filled 2024-01-18: qty 1

## 2024-01-18 MED ORDER — APIXABAN 5 MG PO TABS
5.0000 mg | ORAL_TABLET | Freq: Two times a day (BID) | ORAL | Status: DC
  Administered 2024-01-18 (×2): 5 mg via ORAL
  Filled 2024-01-18 (×2): qty 1

## 2024-01-18 MED ORDER — EMPAGLIFLOZIN 10 MG PO TABS
10.0000 mg | ORAL_TABLET | Freq: Every day | ORAL | Status: DC
  Administered 2024-01-18: 10 mg via ORAL
  Filled 2024-01-18: qty 1

## 2024-01-18 MED ORDER — FUROSEMIDE 40 MG PO TABS
40.0000 mg | ORAL_TABLET | Freq: Every day | ORAL | Status: DC
  Administered 2024-01-18: 40 mg via ORAL
  Filled 2024-01-18: qty 1

## 2024-01-18 MED ORDER — DOXYCYCLINE HYCLATE 100 MG PO TABS
100.0000 mg | ORAL_TABLET | Freq: Once | ORAL | Status: AC
  Administered 2024-01-18: 100 mg via ORAL
  Filled 2024-01-18: qty 1

## 2024-01-18 MED ORDER — EZETIMIBE 10 MG PO TABS
10.0000 mg | ORAL_TABLET | Freq: Every day | ORAL | Status: DC
  Administered 2024-01-18: 10 mg via ORAL
  Filled 2024-01-18: qty 1

## 2024-01-18 MED ORDER — SPIRONOLACTONE 25 MG PO TABS
25.0000 mg | ORAL_TABLET | Freq: Every day | ORAL | Status: DC
  Administered 2024-01-18: 25 mg via ORAL
  Filled 2024-01-18: qty 1

## 2024-01-18 MED ORDER — ESCITALOPRAM OXALATE 10 MG PO TABS
20.0000 mg | ORAL_TABLET | Freq: Every day | ORAL | Status: DC
  Administered 2024-01-18: 20 mg via ORAL
  Filled 2024-01-18: qty 2

## 2024-01-18 MED ORDER — MONTELUKAST SODIUM 10 MG PO TABS
10.0000 mg | ORAL_TABLET | Freq: Every day | ORAL | Status: DC
  Administered 2024-01-18: 10 mg via ORAL
  Filled 2024-01-18: qty 1

## 2024-01-18 MED ORDER — ATORVASTATIN CALCIUM 20 MG PO TABS
80.0000 mg | ORAL_TABLET | Freq: Every day | ORAL | Status: DC
  Administered 2024-01-18: 80 mg via ORAL
  Filled 2024-01-18: qty 4

## 2024-01-18 NOTE — ED Notes (Signed)
 Patient bed linen changed and pulled up in bed. Warm blankets provided.

## 2024-01-18 NOTE — ED Notes (Signed)
 Patient was able to get in contact with brother and brother stated that he was still in line at the [facility]. He also stated that his wife had called someone and got transportation provided for patient.

## 2024-01-18 NOTE — ED Notes (Signed)
 Patient sat up to eat lunch.

## 2024-01-18 NOTE — ED Notes (Signed)
 RN was able to make contact with patients brother who stated he was in line at the [facility] to obtain his Real ID. He stated it may take a while before he could make it to the ER to pick up the patient.

## 2024-01-18 NOTE — ED Notes (Signed)
 Patient provided with phone to call brother and others that may be able to come pick him up.

## 2024-01-18 NOTE — ED Notes (Signed)
Called brother. No answer

## 2024-01-18 NOTE — ED Provider Notes (Signed)
 1:20 AM  Pt awaiting transport home.  Does not meet criteria to go home by [facility].  Brother to pick him up in the morning.  Here for lower extremity cellulitis getting Keflex , doxycycline .  Hypertensive here but asymptomatic.  Home medications have been reordered as well as a diet.   Tuck Dulworth, Josette SAILOR, DO 01/18/24 [phone]

## 2024-01-18 NOTE — ED Notes (Signed)
 Patient provided with coke and [place] sandwhich tray.

## 2024-01-18 NOTE — ED Notes (Signed)
 Called brother and brother stated he was still in line at [facility]. Pt given phone to speak with his brother.

## 2024-01-18 NOTE — Discharge Instructions (Addendum)
You have been seen today in the Emergency Department (ED) for cellulitis, a superficial skin infection. Please take your antibiotics as prescribed for their ENTIRE prescribed duration.  Take Tylenol or Motrin as needed for pain, but only as written on the box.   Please follow up with your doctor or in the ED in 24-48 hours for recheck of your infection if you are not improving.  Call your doctor sooner or return to the ED if you develop worsening signs of infection such as: increased redness, increased pain, pus, fever, or other symptoms that concern you.

## 2024-01-18 NOTE — ED Notes (Signed)
 Patient has phone to continue attempting to call for a ride. [facility] will not transport.

## 2024-01-22 LAB — CULTURE, BLOOD (SINGLE): Culture: NO GROWTH

## 2024-03-27 DIAGNOSIS — I499 Cardiac arrhythmia, unspecified: Secondary | ICD-10-CM | POA: Diagnosis not present

## 2024-03-27 DIAGNOSIS — Z043 Encounter for examination and observation following other accident: Secondary | ICD-10-CM | POA: Diagnosis not present

## 2024-03-27 DIAGNOSIS — R4182 Altered mental status, unspecified: Secondary | ICD-10-CM | POA: Diagnosis not present

## 2024-03-27 DIAGNOSIS — R404 Transient alteration of awareness: Secondary | ICD-10-CM | POA: Diagnosis not present

## 2024-03-27 DIAGNOSIS — S199XXA Unspecified injury of neck, initial encounter: Secondary | ICD-10-CM | POA: Diagnosis not present

## 2024-03-27 DIAGNOSIS — I10 Essential (primary) hypertension: Secondary | ICD-10-CM | POA: Diagnosis not present

## 2024-03-27 DIAGNOSIS — I4510 Unspecified right bundle-branch block: Secondary | ICD-10-CM | POA: Diagnosis not present

## 2024-03-27 DIAGNOSIS — I4430 Unspecified atrioventricular block: Secondary | ICD-10-CM | POA: Diagnosis not present

## 2024-03-27 DIAGNOSIS — G4489 Other headache syndrome: Secondary | ICD-10-CM | POA: Diagnosis not present

## 2024-03-28 DIAGNOSIS — G35D Multiple sclerosis, unspecified: Secondary | ICD-10-CM | POA: Diagnosis not present

## 2024-03-28 DIAGNOSIS — I4891 Unspecified atrial fibrillation: Secondary | ICD-10-CM | POA: Diagnosis not present

## 2024-03-28 DIAGNOSIS — R4182 Altered mental status, unspecified: Secondary | ICD-10-CM | POA: Diagnosis not present

## 2024-03-28 DIAGNOSIS — I25810 Atherosclerosis of coronary artery bypass graft(s) without angina pectoris: Secondary | ICD-10-CM | POA: Diagnosis not present

## 2024-03-29 DIAGNOSIS — I5020 Unspecified systolic (congestive) heart failure: Secondary | ICD-10-CM | POA: Diagnosis not present

## 2024-03-29 DIAGNOSIS — I4892 Unspecified atrial flutter: Secondary | ICD-10-CM | POA: Diagnosis not present

## 2024-03-29 DIAGNOSIS — R4182 Altered mental status, unspecified: Secondary | ICD-10-CM | POA: Diagnosis not present

## 2024-03-29 DIAGNOSIS — R7989 Other specified abnormal findings of blood chemistry: Secondary | ICD-10-CM | POA: Diagnosis not present

## 2024-03-29 DIAGNOSIS — I4891 Unspecified atrial fibrillation: Secondary | ICD-10-CM | POA: Diagnosis not present

## 2024-03-29 DIAGNOSIS — I2510 Atherosclerotic heart disease of native coronary artery without angina pectoris: Secondary | ICD-10-CM | POA: Diagnosis not present

## 2024-03-29 DIAGNOSIS — I517 Cardiomegaly: Secondary | ICD-10-CM | POA: Diagnosis not present

## 2024-03-30 DIAGNOSIS — R4182 Altered mental status, unspecified: Secondary | ICD-10-CM | POA: Diagnosis not present

## 2024-03-30 DIAGNOSIS — R7989 Other specified abnormal findings of blood chemistry: Secondary | ICD-10-CM | POA: Diagnosis not present

## 2024-03-30 DIAGNOSIS — I5030 Unspecified diastolic (congestive) heart failure: Secondary | ICD-10-CM | POA: Diagnosis not present

## 2024-03-30 DIAGNOSIS — I4892 Unspecified atrial flutter: Secondary | ICD-10-CM | POA: Diagnosis not present

## 2024-03-30 DIAGNOSIS — I2510 Atherosclerotic heart disease of native coronary artery without angina pectoris: Secondary | ICD-10-CM | POA: Diagnosis not present

## 2024-03-30 DIAGNOSIS — F39 Unspecified mood [affective] disorder: Secondary | ICD-10-CM | POA: Diagnosis not present

## 2024-03-30 DIAGNOSIS — I4891 Unspecified atrial fibrillation: Secondary | ICD-10-CM | POA: Diagnosis not present

## 2024-03-31 DIAGNOSIS — I110 Hypertensive heart disease with heart failure: Secondary | ICD-10-CM | POA: Diagnosis not present

## 2024-03-31 DIAGNOSIS — I5032 Chronic diastolic (congestive) heart failure: Secondary | ICD-10-CM | POA: Diagnosis not present

## 2024-03-31 DIAGNOSIS — R4182 Altered mental status, unspecified: Secondary | ICD-10-CM | POA: Diagnosis not present

## 2024-03-31 DIAGNOSIS — I4892 Unspecified atrial flutter: Secondary | ICD-10-CM | POA: Diagnosis not present

## 2024-04-01 DIAGNOSIS — G9341 Metabolic encephalopathy: Secondary | ICD-10-CM | POA: Diagnosis not present

## 2024-04-01 DIAGNOSIS — F39 Unspecified mood [affective] disorder: Secondary | ICD-10-CM | POA: Diagnosis not present

## 2024-04-01 DIAGNOSIS — I4892 Unspecified atrial flutter: Secondary | ICD-10-CM | POA: Diagnosis not present

## 2024-04-01 DIAGNOSIS — I5032 Chronic diastolic (congestive) heart failure: Secondary | ICD-10-CM | POA: Diagnosis not present

## 2024-04-02 DIAGNOSIS — R7989 Other specified abnormal findings of blood chemistry: Secondary | ICD-10-CM | POA: Diagnosis not present

## 2024-04-02 DIAGNOSIS — R0602 Shortness of breath: Secondary | ICD-10-CM | POA: Diagnosis not present

## 2024-04-02 DIAGNOSIS — I5030 Unspecified diastolic (congestive) heart failure: Secondary | ICD-10-CM | POA: Diagnosis not present

## 2024-04-02 DIAGNOSIS — R4182 Altered mental status, unspecified: Secondary | ICD-10-CM | POA: Diagnosis not present

## 2024-04-02 DIAGNOSIS — I4891 Unspecified atrial fibrillation: Secondary | ICD-10-CM | POA: Diagnosis not present

## 2024-04-02 DIAGNOSIS — I2510 Atherosclerotic heart disease of native coronary artery without angina pectoris: Secondary | ICD-10-CM | POA: Diagnosis not present

## 2024-04-03 DIAGNOSIS — I6523 Occlusion and stenosis of bilateral carotid arteries: Secondary | ICD-10-CM | POA: Diagnosis not present

## 2024-04-03 DIAGNOSIS — I5030 Unspecified diastolic (congestive) heart failure: Secondary | ICD-10-CM | POA: Diagnosis not present

## 2024-04-03 DIAGNOSIS — R4182 Altered mental status, unspecified: Secondary | ICD-10-CM | POA: Diagnosis not present

## 2024-04-03 DIAGNOSIS — I4891 Unspecified atrial fibrillation: Secondary | ICD-10-CM | POA: Diagnosis not present

## 2024-04-03 DIAGNOSIS — R7989 Other specified abnormal findings of blood chemistry: Secondary | ICD-10-CM | POA: Diagnosis not present

## 2024-04-04 DIAGNOSIS — I480 Paroxysmal atrial fibrillation: Secondary | ICD-10-CM | POA: Diagnosis not present

## 2024-04-04 DIAGNOSIS — I5020 Unspecified systolic (congestive) heart failure: Secondary | ICD-10-CM | POA: Diagnosis not present

## 2024-04-04 DIAGNOSIS — I25810 Atherosclerosis of coronary artery bypass graft(s) without angina pectoris: Secondary | ICD-10-CM | POA: Diagnosis not present

## 2024-04-04 DIAGNOSIS — I4891 Unspecified atrial fibrillation: Secondary | ICD-10-CM | POA: Diagnosis not present

## 2024-04-04 DIAGNOSIS — F1090 Alcohol use, unspecified, uncomplicated: Secondary | ICD-10-CM | POA: Diagnosis not present

## 2024-04-04 DIAGNOSIS — R4182 Altered mental status, unspecified: Secondary | ICD-10-CM | POA: Diagnosis not present

## 2024-04-04 DIAGNOSIS — G4733 Obstructive sleep apnea (adult) (pediatric): Secondary | ICD-10-CM | POA: Diagnosis not present

## 2024-04-04 DIAGNOSIS — Z8673 Personal history of transient ischemic attack (TIA), and cerebral infarction without residual deficits: Secondary | ICD-10-CM | POA: Diagnosis not present

## 2024-04-04 DIAGNOSIS — I5030 Unspecified diastolic (congestive) heart failure: Secondary | ICD-10-CM | POA: Diagnosis not present

## 2024-04-04 DIAGNOSIS — I6522 Occlusion and stenosis of left carotid artery: Secondary | ICD-10-CM | POA: Diagnosis not present

## 2024-04-04 DIAGNOSIS — E039 Hypothyroidism, unspecified: Secondary | ICD-10-CM | POA: Diagnosis not present

## 2024-04-04 DIAGNOSIS — M6281 Muscle weakness (generalized): Secondary | ICD-10-CM | POA: Diagnosis not present

## 2024-04-04 DIAGNOSIS — E0590 Thyrotoxicosis, unspecified without thyrotoxic crisis or storm: Secondary | ICD-10-CM | POA: Diagnosis not present

## 2024-04-04 DIAGNOSIS — I639 Cerebral infarction, unspecified: Secondary | ICD-10-CM | POA: Diagnosis not present

## 2024-04-04 DIAGNOSIS — R635 Abnormal weight gain: Secondary | ICD-10-CM | POA: Diagnosis not present

## 2024-04-04 DIAGNOSIS — F419 Anxiety disorder, unspecified: Secondary | ICD-10-CM | POA: Diagnosis not present

## 2024-04-04 DIAGNOSIS — I5032 Chronic diastolic (congestive) heart failure: Secondary | ICD-10-CM | POA: Diagnosis not present

## 2024-04-04 DIAGNOSIS — R55 Syncope and collapse: Secondary | ICD-10-CM | POA: Diagnosis not present

## 2024-04-04 DIAGNOSIS — Z951 Presence of aortocoronary bypass graft: Secondary | ICD-10-CM | POA: Diagnosis not present

## 2024-04-04 DIAGNOSIS — I110 Hypertensive heart disease with heart failure: Secondary | ICD-10-CM | POA: Diagnosis not present

## 2024-04-04 DIAGNOSIS — E785 Hyperlipidemia, unspecified: Secondary | ICD-10-CM | POA: Diagnosis not present

## 2024-04-04 DIAGNOSIS — R2689 Other abnormalities of gait and mobility: Secondary | ICD-10-CM | POA: Diagnosis not present

## 2024-04-04 DIAGNOSIS — I10 Essential (primary) hypertension: Secondary | ICD-10-CM | POA: Diagnosis not present

## 2024-04-04 DIAGNOSIS — L97821 Non-pressure chronic ulcer of other part of left lower leg limited to breakdown of skin: Secondary | ICD-10-CM | POA: Diagnosis not present

## 2024-04-04 DIAGNOSIS — R531 Weakness: Secondary | ICD-10-CM | POA: Diagnosis not present

## 2024-04-04 DIAGNOSIS — R41841 Cognitive communication deficit: Secondary | ICD-10-CM | POA: Diagnosis not present

## 2024-04-04 DIAGNOSIS — R279 Unspecified lack of coordination: Secondary | ICD-10-CM | POA: Diagnosis not present

## 2024-04-04 DIAGNOSIS — G35D Multiple sclerosis, unspecified: Secondary | ICD-10-CM | POA: Diagnosis not present

## 2024-04-04 DIAGNOSIS — I6523 Occlusion and stenosis of bilateral carotid arteries: Secondary | ICD-10-CM | POA: Diagnosis not present

## 2024-04-04 DIAGNOSIS — I6521 Occlusion and stenosis of right carotid artery: Secondary | ICD-10-CM | POA: Diagnosis not present

## 2024-04-04 DIAGNOSIS — E7841 Elevated Lipoprotein(a): Secondary | ICD-10-CM | POA: Diagnosis not present

## 2024-04-04 DIAGNOSIS — R296 Repeated falls: Secondary | ICD-10-CM | POA: Diagnosis not present

## 2024-04-04 DIAGNOSIS — R5381 Other malaise: Secondary | ICD-10-CM | POA: Diagnosis not present

## 2024-04-04 DIAGNOSIS — I2510 Atherosclerotic heart disease of native coronary artery without angina pectoris: Secondary | ICD-10-CM | POA: Diagnosis not present

## 2024-04-05 DIAGNOSIS — G4733 Obstructive sleep apnea (adult) (pediatric): Secondary | ICD-10-CM | POA: Diagnosis not present

## 2024-04-05 DIAGNOSIS — E0590 Thyrotoxicosis, unspecified without thyrotoxic crisis or storm: Secondary | ICD-10-CM | POA: Diagnosis not present

## 2024-04-05 DIAGNOSIS — G35D Multiple sclerosis, unspecified: Secondary | ICD-10-CM | POA: Diagnosis not present

## 2024-04-05 DIAGNOSIS — I4891 Unspecified atrial fibrillation: Secondary | ICD-10-CM | POA: Diagnosis not present

## 2024-04-05 DIAGNOSIS — L97821 Non-pressure chronic ulcer of other part of left lower leg limited to breakdown of skin: Secondary | ICD-10-CM | POA: Diagnosis not present

## 2024-04-05 DIAGNOSIS — E039 Hypothyroidism, unspecified: Secondary | ICD-10-CM | POA: Diagnosis not present

## 2024-04-05 DIAGNOSIS — I5032 Chronic diastolic (congestive) heart failure: Secondary | ICD-10-CM | POA: Diagnosis not present

## 2024-04-05 DIAGNOSIS — Z951 Presence of aortocoronary bypass graft: Secondary | ICD-10-CM | POA: Diagnosis not present

## 2024-04-05 DIAGNOSIS — F1090 Alcohol use, unspecified, uncomplicated: Secondary | ICD-10-CM | POA: Diagnosis not present

## 2024-04-05 DIAGNOSIS — I2510 Atherosclerotic heart disease of native coronary artery without angina pectoris: Secondary | ICD-10-CM | POA: Diagnosis not present

## 2024-04-05 DIAGNOSIS — I10 Essential (primary) hypertension: Secondary | ICD-10-CM | POA: Diagnosis not present

## 2024-04-05 DIAGNOSIS — E785 Hyperlipidemia, unspecified: Secondary | ICD-10-CM | POA: Diagnosis not present

## 2024-04-05 DIAGNOSIS — Z8673 Personal history of transient ischemic attack (TIA), and cerebral infarction without residual deficits: Secondary | ICD-10-CM | POA: Diagnosis not present

## 2024-04-06 DIAGNOSIS — I5030 Unspecified diastolic (congestive) heart failure: Secondary | ICD-10-CM | POA: Diagnosis not present

## 2024-04-06 DIAGNOSIS — R296 Repeated falls: Secondary | ICD-10-CM | POA: Diagnosis not present

## 2024-04-06 DIAGNOSIS — R531 Weakness: Secondary | ICD-10-CM | POA: Diagnosis not present

## 2024-04-06 DIAGNOSIS — G35D Multiple sclerosis, unspecified: Secondary | ICD-10-CM | POA: Diagnosis not present

## 2024-04-06 DIAGNOSIS — I4891 Unspecified atrial fibrillation: Secondary | ICD-10-CM | POA: Diagnosis not present

## 2024-04-06 DIAGNOSIS — I110 Hypertensive heart disease with heart failure: Secondary | ICD-10-CM | POA: Diagnosis not present

## 2024-04-10 DIAGNOSIS — I6522 Occlusion and stenosis of left carotid artery: Secondary | ICD-10-CM | POA: Diagnosis not present

## 2024-04-10 DIAGNOSIS — E785 Hyperlipidemia, unspecified: Secondary | ICD-10-CM | POA: Diagnosis not present

## 2024-04-10 DIAGNOSIS — I5020 Unspecified systolic (congestive) heart failure: Secondary | ICD-10-CM | POA: Diagnosis not present

## 2024-04-10 DIAGNOSIS — R55 Syncope and collapse: Secondary | ICD-10-CM | POA: Diagnosis not present

## 2024-04-10 DIAGNOSIS — I480 Paroxysmal atrial fibrillation: Secondary | ICD-10-CM | POA: Diagnosis not present

## 2024-04-10 DIAGNOSIS — I10 Essential (primary) hypertension: Secondary | ICD-10-CM | POA: Diagnosis not present

## 2024-04-10 DIAGNOSIS — I6521 Occlusion and stenosis of right carotid artery: Secondary | ICD-10-CM | POA: Diagnosis not present

## 2024-04-10 DIAGNOSIS — Z8673 Personal history of transient ischemic attack (TIA), and cerebral infarction without residual deficits: Secondary | ICD-10-CM | POA: Diagnosis not present

## 2024-04-10 DIAGNOSIS — E7841 Elevated Lipoprotein(a): Secondary | ICD-10-CM | POA: Diagnosis not present

## 2024-04-10 DIAGNOSIS — I2510 Atherosclerotic heart disease of native coronary artery without angina pectoris: Secondary | ICD-10-CM | POA: Diagnosis not present

## 2024-04-10 DIAGNOSIS — R296 Repeated falls: Secondary | ICD-10-CM | POA: Diagnosis not present

## 2024-04-10 DIAGNOSIS — Z951 Presence of aortocoronary bypass graft: Secondary | ICD-10-CM | POA: Diagnosis not present

## 2024-04-11 DIAGNOSIS — I5030 Unspecified diastolic (congestive) heart failure: Secondary | ICD-10-CM | POA: Diagnosis not present

## 2024-04-11 DIAGNOSIS — I4891 Unspecified atrial fibrillation: Secondary | ICD-10-CM | POA: Diagnosis not present

## 2024-04-11 DIAGNOSIS — R531 Weakness: Secondary | ICD-10-CM | POA: Diagnosis not present

## 2024-04-11 DIAGNOSIS — I110 Hypertensive heart disease with heart failure: Secondary | ICD-10-CM | POA: Diagnosis not present

## 2024-04-12 DIAGNOSIS — F1090 Alcohol use, unspecified, uncomplicated: Secondary | ICD-10-CM | POA: Diagnosis not present

## 2024-04-12 DIAGNOSIS — L97821 Non-pressure chronic ulcer of other part of left lower leg limited to breakdown of skin: Secondary | ICD-10-CM | POA: Diagnosis not present

## 2024-04-12 DIAGNOSIS — E785 Hyperlipidemia, unspecified: Secondary | ICD-10-CM | POA: Diagnosis not present

## 2024-04-12 DIAGNOSIS — R296 Repeated falls: Secondary | ICD-10-CM | POA: Diagnosis not present

## 2024-04-12 DIAGNOSIS — G35D Multiple sclerosis, unspecified: Secondary | ICD-10-CM | POA: Diagnosis not present

## 2024-04-12 DIAGNOSIS — I4891 Unspecified atrial fibrillation: Secondary | ICD-10-CM | POA: Diagnosis not present

## 2024-04-12 DIAGNOSIS — R531 Weakness: Secondary | ICD-10-CM | POA: Diagnosis not present

## 2024-04-12 DIAGNOSIS — E039 Hypothyroidism, unspecified: Secondary | ICD-10-CM | POA: Diagnosis not present

## 2024-04-14 DIAGNOSIS — R531 Weakness: Secondary | ICD-10-CM | POA: Diagnosis not present

## 2024-04-14 DIAGNOSIS — I110 Hypertensive heart disease with heart failure: Secondary | ICD-10-CM | POA: Diagnosis not present

## 2024-04-14 DIAGNOSIS — I5030 Unspecified diastolic (congestive) heart failure: Secondary | ICD-10-CM | POA: Diagnosis not present

## 2024-04-14 DIAGNOSIS — G35D Multiple sclerosis, unspecified: Secondary | ICD-10-CM | POA: Diagnosis not present

## 2024-04-17 DIAGNOSIS — R635 Abnormal weight gain: Secondary | ICD-10-CM | POA: Diagnosis not present

## 2024-04-17 DIAGNOSIS — I110 Hypertensive heart disease with heart failure: Secondary | ICD-10-CM | POA: Diagnosis not present

## 2024-04-17 DIAGNOSIS — I5030 Unspecified diastolic (congestive) heart failure: Secondary | ICD-10-CM | POA: Diagnosis not present

## 2024-04-18 DIAGNOSIS — R635 Abnormal weight gain: Secondary | ICD-10-CM | POA: Diagnosis not present

## 2024-04-18 DIAGNOSIS — I110 Hypertensive heart disease with heart failure: Secondary | ICD-10-CM | POA: Diagnosis not present

## 2024-04-18 DIAGNOSIS — I5030 Unspecified diastolic (congestive) heart failure: Secondary | ICD-10-CM | POA: Diagnosis not present

## 2024-04-21 DIAGNOSIS — R531 Weakness: Secondary | ICD-10-CM | POA: Diagnosis not present

## 2024-04-21 DIAGNOSIS — R296 Repeated falls: Secondary | ICD-10-CM | POA: Diagnosis not present

## 2024-04-21 DIAGNOSIS — G35D Multiple sclerosis, unspecified: Secondary | ICD-10-CM | POA: Diagnosis not present

## 2024-04-23 DIAGNOSIS — I10 Essential (primary) hypertension: Secondary | ICD-10-CM | POA: Diagnosis not present

## 2024-04-23 DIAGNOSIS — R296 Repeated falls: Secondary | ICD-10-CM | POA: Diagnosis not present

## 2024-04-23 DIAGNOSIS — G35D Multiple sclerosis, unspecified: Secondary | ICD-10-CM | POA: Diagnosis not present

## 2024-04-23 DIAGNOSIS — R531 Weakness: Secondary | ICD-10-CM | POA: Diagnosis not present

## 2024-04-26 DIAGNOSIS — I6521 Occlusion and stenosis of right carotid artery: Secondary | ICD-10-CM | POA: Diagnosis not present

## 2024-04-26 DIAGNOSIS — R531 Weakness: Secondary | ICD-10-CM | POA: Diagnosis not present

## 2024-04-26 DIAGNOSIS — I110 Hypertensive heart disease with heart failure: Secondary | ICD-10-CM | POA: Diagnosis not present

## 2024-04-26 DIAGNOSIS — R55 Syncope and collapse: Secondary | ICD-10-CM | POA: Diagnosis not present

## 2024-04-26 DIAGNOSIS — J069 Acute upper respiratory infection, unspecified: Secondary | ICD-10-CM | POA: Diagnosis not present

## 2024-04-26 DIAGNOSIS — I5030 Unspecified diastolic (congestive) heart failure: Secondary | ICD-10-CM | POA: Diagnosis not present

## 2024-04-26 DIAGNOSIS — I6522 Occlusion and stenosis of left carotid artery: Secondary | ICD-10-CM | POA: Diagnosis not present

## 2024-04-26 DIAGNOSIS — G35D Multiple sclerosis, unspecified: Secondary | ICD-10-CM | POA: Diagnosis not present

## 2024-04-27 DIAGNOSIS — I5030 Unspecified diastolic (congestive) heart failure: Secondary | ICD-10-CM | POA: Diagnosis not present

## 2024-04-27 DIAGNOSIS — J069 Acute upper respiratory infection, unspecified: Secondary | ICD-10-CM | POA: Diagnosis not present

## 2024-04-27 DIAGNOSIS — G35D Multiple sclerosis, unspecified: Secondary | ICD-10-CM | POA: Diagnosis not present

## 2024-04-27 DIAGNOSIS — I110 Hypertensive heart disease with heart failure: Secondary | ICD-10-CM | POA: Diagnosis not present

## 2024-04-28 DIAGNOSIS — R0602 Shortness of breath: Secondary | ICD-10-CM | POA: Diagnosis not present

## 2024-04-28 DIAGNOSIS — I110 Hypertensive heart disease with heart failure: Secondary | ICD-10-CM | POA: Diagnosis not present

## 2024-04-28 DIAGNOSIS — J069 Acute upper respiratory infection, unspecified: Secondary | ICD-10-CM | POA: Diagnosis not present

## 2024-04-28 DIAGNOSIS — G35D Multiple sclerosis, unspecified: Secondary | ICD-10-CM | POA: Diagnosis not present

## 2024-04-28 DIAGNOSIS — R062 Wheezing: Secondary | ICD-10-CM | POA: Diagnosis not present

## 2024-04-28 DIAGNOSIS — I5030 Unspecified diastolic (congestive) heart failure: Secondary | ICD-10-CM | POA: Diagnosis not present

## 2024-04-28 DIAGNOSIS — R059 Cough, unspecified: Secondary | ICD-10-CM | POA: Diagnosis not present
